# Patient Record
Sex: Female | Born: 1945
Health system: Southern US, Community
[De-identification: ages and names within clinical notes are randomized; demographics above are authoritative.]

## PROBLEM LIST (undated history)

## (undated) DIAGNOSIS — R112 Nausea with vomiting, unspecified: Secondary | ICD-10-CM

## (undated) DIAGNOSIS — I4891 Unspecified atrial fibrillation: Secondary | ICD-10-CM

## (undated) DIAGNOSIS — Z9889 Other specified postprocedural states: Secondary | ICD-10-CM

## (undated) DIAGNOSIS — R0600 Dyspnea, unspecified: Secondary | ICD-10-CM

## (undated) DIAGNOSIS — R300 Dysuria: Secondary | ICD-10-CM

## (undated) DIAGNOSIS — N189 Chronic kidney disease, unspecified: Secondary | ICD-10-CM

## (undated) DIAGNOSIS — T8859XA Other complications of anesthesia, initial encounter: Secondary | ICD-10-CM

## (undated) DIAGNOSIS — I639 Cerebral infarction, unspecified: Secondary | ICD-10-CM

## (undated) DIAGNOSIS — Z87442 Personal history of urinary calculi: Secondary | ICD-10-CM

## (undated) DIAGNOSIS — I10 Essential (primary) hypertension: Secondary | ICD-10-CM

## (undated) DIAGNOSIS — T4145XA Adverse effect of unspecified anesthetic, initial encounter: Secondary | ICD-10-CM

## (undated) DIAGNOSIS — G4730 Sleep apnea, unspecified: Secondary | ICD-10-CM

## (undated) DIAGNOSIS — M8580 Other specified disorders of bone density and structure, unspecified site: Secondary | ICD-10-CM

## (undated) DIAGNOSIS — N393 Stress incontinence (female) (male): Secondary | ICD-10-CM

## (undated) DIAGNOSIS — M1990 Unspecified osteoarthritis, unspecified site: Secondary | ICD-10-CM

## (undated) DIAGNOSIS — E119 Type 2 diabetes mellitus without complications: Secondary | ICD-10-CM

## (undated) HISTORY — PX: OTHER SURGICAL HISTORY: SHX169

## (undated) HISTORY — PX: KNEE ARTHROSCOPY: SUR90

## (undated) HISTORY — PX: LITHOTRIPSY: SUR834

## (undated) HISTORY — PX: JOINT REPLACEMENT: SHX530

## (undated) HISTORY — PX: TRIGGER FINGER RELEASE: SHX641

## (undated) HISTORY — PX: EYE SURGERY: SHX253

## (undated) HISTORY — PX: KIDNEY STONE SURGERY: SHX686

## (undated) HISTORY — PX: COLONOSCOPY W/ POLYPECTOMY: SHX1380

---

## 2004-03-23 ENCOUNTER — Inpatient Hospital Stay (HOSPITAL_COMMUNITY): Admission: RE | Admit: 2004-03-23 | Discharge: 2004-03-26 | Payer: Self-pay | Admitting: Orthopedic Surgery

## 2004-04-21 ENCOUNTER — Encounter: Payer: Self-pay | Admitting: Orthopedic Surgery

## 2004-05-10 ENCOUNTER — Encounter: Payer: Self-pay | Admitting: Orthopedic Surgery

## 2004-06-06 ENCOUNTER — Inpatient Hospital Stay: Payer: Self-pay | Admitting: Urology

## 2004-06-10 ENCOUNTER — Encounter: Payer: Self-pay | Admitting: Orthopedic Surgery

## 2004-06-12 ENCOUNTER — Other Ambulatory Visit: Payer: Self-pay

## 2004-07-08 ENCOUNTER — Encounter: Payer: Self-pay | Admitting: Orthopedic Surgery

## 2004-07-10 ENCOUNTER — Encounter: Payer: Self-pay | Admitting: Internal Medicine

## 2006-10-11 ENCOUNTER — Ambulatory Visit: Payer: Self-pay | Admitting: Internal Medicine

## 2007-11-06 ENCOUNTER — Emergency Department: Payer: Self-pay | Admitting: Emergency Medicine

## 2007-12-21 ENCOUNTER — Ambulatory Visit: Payer: Self-pay | Admitting: Internal Medicine

## 2009-02-13 ENCOUNTER — Ambulatory Visit: Payer: Self-pay | Admitting: Internal Medicine

## 2009-02-17 ENCOUNTER — Ambulatory Visit: Payer: Self-pay | Admitting: Pain Medicine

## 2009-03-03 ENCOUNTER — Ambulatory Visit: Payer: Self-pay | Admitting: Pain Medicine

## 2009-04-01 ENCOUNTER — Ambulatory Visit: Payer: Self-pay | Admitting: Pain Medicine

## 2009-04-07 ENCOUNTER — Ambulatory Visit: Payer: Self-pay | Admitting: Pain Medicine

## 2009-05-08 ENCOUNTER — Ambulatory Visit: Payer: Self-pay | Admitting: Pain Medicine

## 2009-05-19 ENCOUNTER — Ambulatory Visit: Payer: Self-pay | Admitting: Pain Medicine

## 2009-06-19 ENCOUNTER — Ambulatory Visit: Payer: Self-pay | Admitting: Pain Medicine

## 2009-06-30 ENCOUNTER — Ambulatory Visit: Payer: Self-pay | Admitting: Pain Medicine

## 2009-07-14 ENCOUNTER — Ambulatory Visit: Payer: Self-pay | Admitting: Pain Medicine

## 2010-02-25 ENCOUNTER — Ambulatory Visit: Payer: Self-pay | Admitting: Internal Medicine

## 2011-04-08 ENCOUNTER — Ambulatory Visit: Payer: Self-pay | Admitting: Internal Medicine

## 2012-04-12 ENCOUNTER — Ambulatory Visit: Payer: Self-pay | Admitting: Internal Medicine

## 2013-02-08 ENCOUNTER — Ambulatory Visit: Payer: Self-pay | Admitting: Ophthalmology

## 2013-02-08 LAB — POTASSIUM: Potassium: 3.3 mmol/L — ABNORMAL LOW (ref 3.5–5.1)

## 2013-02-21 ENCOUNTER — Ambulatory Visit: Payer: Self-pay | Admitting: Ophthalmology

## 2013-03-06 ENCOUNTER — Ambulatory Visit: Payer: Self-pay | Admitting: Ophthalmology

## 2013-04-18 ENCOUNTER — Ambulatory Visit: Payer: Self-pay | Admitting: Internal Medicine

## 2013-05-21 ENCOUNTER — Encounter: Payer: Self-pay | Admitting: Internal Medicine

## 2013-06-10 ENCOUNTER — Encounter: Payer: Self-pay | Admitting: Internal Medicine

## 2013-07-08 ENCOUNTER — Encounter: Payer: Self-pay | Admitting: Internal Medicine

## 2013-07-28 ENCOUNTER — Ambulatory Visit: Payer: Self-pay | Admitting: Urology

## 2013-07-28 ENCOUNTER — Inpatient Hospital Stay: Payer: Self-pay | Admitting: Internal Medicine

## 2013-07-28 LAB — COMPREHENSIVE METABOLIC PANEL
Albumin: 2.9 g/dL — ABNORMAL LOW (ref 3.4–5.0)
Alkaline Phosphatase: 60 U/L
Anion Gap: 9 (ref 7–16)
BUN: 32 mg/dL — ABNORMAL HIGH (ref 7–18)
Bilirubin,Total: 1.3 mg/dL — ABNORMAL HIGH (ref 0.2–1.0)
Calcium, Total: 9.1 mg/dL (ref 8.5–10.1)
Chloride: 97 mmol/L — ABNORMAL LOW (ref 98–107)
Co2: 28 mmol/L (ref 21–32)
Creatinine: 2.33 mg/dL — ABNORMAL HIGH (ref 0.60–1.30)
EGFR (African American): 24 — ABNORMAL LOW
EGFR (Non-African Amer.): 21 — ABNORMAL LOW
Glucose: 197 mg/dL — ABNORMAL HIGH (ref 65–99)
Osmolality: 281 (ref 275–301)
Potassium: 3.5 mmol/L (ref 3.5–5.1)
SGOT(AST): 24 U/L (ref 15–37)
SGPT (ALT): 24 U/L (ref 12–78)
Sodium: 134 mmol/L — ABNORMAL LOW (ref 136–145)
Total Protein: 6.2 g/dL — ABNORMAL LOW (ref 6.4–8.2)

## 2013-07-28 LAB — URINALYSIS, COMPLETE
Glucose,UR: NEGATIVE mg/dL (ref 0–75)
Hyaline Cast: 3
Ketone: NEGATIVE
Nitrite: NEGATIVE
Ph: 5 (ref 4.5–8.0)
Protein: 100
RBC,UR: 25 /HPF (ref 0–5)
Specific Gravity: 1.016 (ref 1.003–1.030)
Squamous Epithelial: 19
WBC UR: 686 /HPF (ref 0–5)

## 2013-07-28 LAB — BASIC METABOLIC PANEL
Anion Gap: 9 (ref 7–16)
BUN: 31 mg/dL — ABNORMAL HIGH (ref 7–18)
Calcium, Total: 8.6 mg/dL (ref 8.5–10.1)
Chloride: 102 mmol/L (ref 98–107)
Co2: 24 mmol/L (ref 21–32)
Creatinine: 2.29 mg/dL — ABNORMAL HIGH (ref 0.60–1.30)
EGFR (African American): 25 — ABNORMAL LOW
EGFR (Non-African Amer.): 21 — ABNORMAL LOW
Glucose: 117 mg/dL — ABNORMAL HIGH (ref 65–99)
Osmolality: 278 (ref 275–301)
Potassium: 3.4 mmol/L — ABNORMAL LOW (ref 3.5–5.1)
Sodium: 135 mmol/L — ABNORMAL LOW (ref 136–145)

## 2013-07-28 LAB — CBC WITH DIFFERENTIAL/PLATELET
Basophil #: 0 10*3/uL (ref 0.0–0.1)
Basophil %: 0.2 %
Eosinophil #: 0 10*3/uL (ref 0.0–0.7)
Eosinophil %: 0 %
HCT: 42.8 % (ref 35.0–47.0)
HGB: 14.8 g/dL (ref 12.0–16.0)
Lymphocyte #: 0.6 10*3/uL — ABNORMAL LOW (ref 1.0–3.6)
Lymphocyte %: 2.7 %
MCH: 31.1 pg (ref 26.0–34.0)
MCHC: 34.6 g/dL (ref 32.0–36.0)
MCV: 90 fL (ref 80–100)
Monocyte #: 0.6 x10 3/mm (ref 0.2–0.9)
Monocyte %: 2.8 %
Neutrophil #: 20.1 10*3/uL — ABNORMAL HIGH (ref 1.4–6.5)
Neutrophil %: 94.3 %
Platelet: 232 10*3/uL (ref 150–440)
RBC: 4.76 10*6/uL (ref 3.80–5.20)
RDW: 13.4 % (ref 11.5–14.5)
WBC: 21.4 10*3/uL — ABNORMAL HIGH (ref 3.6–11.0)

## 2013-07-29 LAB — CBC WITH DIFFERENTIAL/PLATELET
Basophil #: 0.1 10*3/uL (ref 0.0–0.1)
Basophil %: 0.4 %
Eosinophil #: 0.1 10*3/uL (ref 0.0–0.7)
Eosinophil %: 0.5 %
HCT: 37.3 % (ref 35.0–47.0)
HGB: 12.7 g/dL (ref 12.0–16.0)
Lymphocyte #: 0.5 10*3/uL — ABNORMAL LOW (ref 1.0–3.6)
Lymphocyte %: 3.1 %
MCH: 30.7 pg (ref 26.0–34.0)
MCHC: 34.1 g/dL (ref 32.0–36.0)
MCV: 90 fL (ref 80–100)
Monocyte #: 0.4 x10 3/mm (ref 0.2–0.9)
Monocyte %: 2.7 %
Neutrophil #: 14.3 10*3/uL — ABNORMAL HIGH (ref 1.4–6.5)
Neutrophil %: 93.3 %
Platelet: 184 10*3/uL (ref 150–440)
RBC: 4.15 10*6/uL (ref 3.80–5.20)
RDW: 13.7 % (ref 11.5–14.5)
WBC: 15.4 10*3/uL — ABNORMAL HIGH (ref 3.6–11.0)

## 2013-07-29 LAB — BASIC METABOLIC PANEL
Anion Gap: 8 (ref 7–16)
BUN: 38 mg/dL — ABNORMAL HIGH (ref 7–18)
Calcium, Total: 8.2 mg/dL — ABNORMAL LOW (ref 8.5–10.1)
Chloride: 102 mmol/L (ref 98–107)
Co2: 25 mmol/L (ref 21–32)
Creatinine: 2.84 mg/dL — ABNORMAL HIGH (ref 0.60–1.30)
EGFR (African American): 19 — ABNORMAL LOW
EGFR (Non-African Amer.): 16 — ABNORMAL LOW
Glucose: 109 mg/dL — ABNORMAL HIGH (ref 65–99)
Osmolality: 280 (ref 275–301)
Potassium: 3.7 mmol/L (ref 3.5–5.1)
Sodium: 135 mmol/L — ABNORMAL LOW (ref 136–145)

## 2013-07-30 LAB — CREATININE, SERUM
Creatinine: 2.4 mg/dL — ABNORMAL HIGH (ref 0.60–1.30)
EGFR (African American): 23 — ABNORMAL LOW
EGFR (Non-African Amer.): 20 — ABNORMAL LOW

## 2013-07-31 LAB — CBC WITH DIFFERENTIAL/PLATELET
Basophil #: 0.1 10*3/uL (ref 0.0–0.1)
Basophil %: 0.6 %
Eosinophil #: 0.4 10*3/uL (ref 0.0–0.7)
Eosinophil %: 4.1 %
HCT: 34.2 % — ABNORMAL LOW (ref 35.0–47.0)
HGB: 11.8 g/dL — ABNORMAL LOW (ref 12.0–16.0)
Lymphocyte #: 0.8 10*3/uL — ABNORMAL LOW (ref 1.0–3.6)
Lymphocyte %: 9.6 %
MCH: 30.8 pg (ref 26.0–34.0)
MCHC: 34.4 g/dL (ref 32.0–36.0)
MCV: 90 fL (ref 80–100)
Monocyte #: 0.6 x10 3/mm (ref 0.2–0.9)
Monocyte %: 7.1 %
Neutrophil #: 6.8 10*3/uL — ABNORMAL HIGH (ref 1.4–6.5)
Neutrophil %: 78.6 %
Platelet: 184 10*3/uL (ref 150–440)
RBC: 3.83 10*6/uL (ref 3.80–5.20)
RDW: 13.9 % (ref 11.5–14.5)
WBC: 8.7 10*3/uL (ref 3.6–11.0)

## 2013-07-31 LAB — BASIC METABOLIC PANEL
Anion Gap: 6 — ABNORMAL LOW (ref 7–16)
BUN: 35 mg/dL — ABNORMAL HIGH (ref 7–18)
Calcium, Total: 7.8 mg/dL — ABNORMAL LOW (ref 8.5–10.1)
Chloride: 112 mmol/L — ABNORMAL HIGH (ref 98–107)
Co2: 24 mmol/L (ref 21–32)
Creatinine: 2.08 mg/dL — ABNORMAL HIGH (ref 0.60–1.30)
EGFR (African American): 27 — ABNORMAL LOW
EGFR (Non-African Amer.): 24 — ABNORMAL LOW
Glucose: 82 mg/dL (ref 65–99)
Osmolality: 290 (ref 275–301)
Potassium: 3.5 mmol/L (ref 3.5–5.1)
Sodium: 142 mmol/L (ref 136–145)

## 2013-08-01 LAB — CBC WITH DIFFERENTIAL/PLATELET
Basophil #: 0 10*3/uL (ref 0.0–0.1)
Basophil %: 0.7 %
Eosinophil #: 0.3 10*3/uL (ref 0.0–0.7)
Eosinophil %: 5 %
HCT: 35.2 % (ref 35.0–47.0)
HGB: 11.9 g/dL — ABNORMAL LOW (ref 12.0–16.0)
Lymphocyte #: 1 10*3/uL (ref 1.0–3.6)
Lymphocyte %: 15.4 %
MCH: 30.3 pg (ref 26.0–34.0)
MCHC: 33.7 g/dL (ref 32.0–36.0)
MCV: 90 fL (ref 80–100)
Monocyte #: 0.7 x10 3/mm (ref 0.2–0.9)
Monocyte %: 9.9 %
Neutrophil #: 4.6 10*3/uL (ref 1.4–6.5)
Neutrophil %: 69 %
Platelet: 208 10*3/uL (ref 150–440)
RBC: 3.91 10*6/uL (ref 3.80–5.20)
RDW: 14.3 % (ref 11.5–14.5)
WBC: 6.7 10*3/uL (ref 3.6–11.0)

## 2013-08-01 LAB — BASIC METABOLIC PANEL
Anion Gap: 6 — ABNORMAL LOW (ref 7–16)
BUN: 27 mg/dL — ABNORMAL HIGH (ref 7–18)
Calcium, Total: 7.8 mg/dL — ABNORMAL LOW (ref 8.5–10.1)
Chloride: 112 mmol/L — ABNORMAL HIGH (ref 98–107)
Co2: 26 mmol/L (ref 21–32)
Creatinine: 1.86 mg/dL — ABNORMAL HIGH (ref 0.60–1.30)
EGFR (African American): 31 — ABNORMAL LOW
EGFR (Non-African Amer.): 27 — ABNORMAL LOW
Glucose: 93 mg/dL (ref 65–99)
Osmolality: 292 (ref 275–301)
Potassium: 3.1 mmol/L — ABNORMAL LOW (ref 3.5–5.1)
Sodium: 144 mmol/L (ref 136–145)

## 2013-08-01 LAB — URINE CULTURE

## 2013-08-09 DIAGNOSIS — N200 Calculus of kidney: Secondary | ICD-10-CM | POA: Insufficient documentation

## 2013-08-09 DIAGNOSIS — N23 Unspecified renal colic: Secondary | ICD-10-CM | POA: Insufficient documentation

## 2013-08-09 DIAGNOSIS — N201 Calculus of ureter: Secondary | ICD-10-CM | POA: Insufficient documentation

## 2013-08-09 DIAGNOSIS — N1330 Unspecified hydronephrosis: Secondary | ICD-10-CM | POA: Insufficient documentation

## 2013-08-13 ENCOUNTER — Ambulatory Visit: Payer: Self-pay | Admitting: Urology

## 2013-08-13 LAB — CBC WITH DIFFERENTIAL/PLATELET
Basophil #: 0.1 10*3/uL (ref 0.0–0.1)
Basophil %: 1.4 %
Eosinophil #: 0.2 10*3/uL (ref 0.0–0.7)
Eosinophil %: 2.7 %
HCT: 39.6 % (ref 35.0–47.0)
HGB: 12.9 g/dL (ref 12.0–16.0)
Lymphocyte #: 1.8 10*3/uL (ref 1.0–3.6)
Lymphocyte %: 25.5 %
MCH: 29 pg (ref 26.0–34.0)
MCHC: 32.6 g/dL (ref 32.0–36.0)
MCV: 89 fL (ref 80–100)
Monocyte #: 0.7 x10 3/mm (ref 0.2–0.9)
Monocyte %: 9.3 %
Neutrophil #: 4.4 10*3/uL (ref 1.4–6.5)
Neutrophil %: 61.1 %
Platelet: 390 10*3/uL (ref 150–440)
RBC: 4.45 10*6/uL (ref 3.80–5.20)
RDW: 14.2 % (ref 11.5–14.5)
WBC: 7.2 10*3/uL (ref 3.6–11.0)

## 2013-08-13 LAB — BASIC METABOLIC PANEL
Anion Gap: 5 — ABNORMAL LOW (ref 7–16)
BUN: 14 mg/dL (ref 7–18)
Calcium, Total: 9.1 mg/dL (ref 8.5–10.1)
Chloride: 104 mmol/L (ref 98–107)
Co2: 30 mmol/L (ref 21–32)
Creatinine: 1.62 mg/dL — ABNORMAL HIGH (ref 0.60–1.30)
EGFR (African American): 37 — ABNORMAL LOW
EGFR (Non-African Amer.): 32 — ABNORMAL LOW
Glucose: 147 mg/dL — ABNORMAL HIGH (ref 65–99)
Osmolality: 281 (ref 275–301)
Potassium: 4.1 mmol/L (ref 3.5–5.1)
Sodium: 139 mmol/L (ref 136–145)

## 2013-08-14 ENCOUNTER — Ambulatory Visit: Payer: Self-pay | Admitting: Urology

## 2013-08-31 DIAGNOSIS — N200 Calculus of kidney: Secondary | ICD-10-CM | POA: Insufficient documentation

## 2013-08-31 DIAGNOSIS — T190XXA Foreign body in urethra, initial encounter: Secondary | ICD-10-CM | POA: Insufficient documentation

## 2013-08-31 DIAGNOSIS — T191XXA Foreign body in bladder, initial encounter: Secondary | ICD-10-CM

## 2014-02-25 DIAGNOSIS — E118 Type 2 diabetes mellitus with unspecified complications: Secondary | ICD-10-CM | POA: Insufficient documentation

## 2014-05-15 DIAGNOSIS — E785 Hyperlipidemia, unspecified: Secondary | ICD-10-CM | POA: Diagnosis not present

## 2014-05-15 DIAGNOSIS — I10 Essential (primary) hypertension: Secondary | ICD-10-CM | POA: Diagnosis not present

## 2014-05-15 DIAGNOSIS — E119 Type 2 diabetes mellitus without complications: Secondary | ICD-10-CM | POA: Diagnosis not present

## 2014-05-22 DIAGNOSIS — M109 Gout, unspecified: Secondary | ICD-10-CM | POA: Insufficient documentation

## 2014-05-22 DIAGNOSIS — M1000 Idiopathic gout, unspecified site: Secondary | ICD-10-CM | POA: Insufficient documentation

## 2014-06-13 ENCOUNTER — Ambulatory Visit: Payer: Self-pay | Admitting: Internal Medicine

## 2014-06-13 DIAGNOSIS — Z1231 Encounter for screening mammogram for malignant neoplasm of breast: Secondary | ICD-10-CM | POA: Diagnosis not present

## 2014-08-30 NOTE — Op Note (Signed)
PATIENT NAME:  [name], DROEGE MR#:  [id number] DATE OF BIRTH:  1946/01/14  DATE OF PROCEDURE:  03/06/2013  PREOPERATIVE DIAGNOSIS: Visually significant cataract of the left eye.   POSTOPERATIVE DIAGNOSIS: Visually significant cataract of the left eye.   OPERATIVE PROCEDURE: Cataract extraction by phacoemulsification with implant of intraocular lens to left eye.   SURGEON: Birder Robson, MD.   ANESTHESIA:  1. Managed anesthesia care.  2. Topical tetracaine drops followed by 2% Xylocaine jelly applied in the preoperative holding area.   COMPLICATIONS: None.   TECHNIQUE: Stop and chop.   DESCRIPTION OF PROCEDURE: The patient was examined and consented in the preoperative holding area where the aforementioned topical anesthesia was applied to the left eye and then brought back to the Operating Room where the left eye was prepped and draped in the usual sterile ophthalmic fashion and a lid speculum was placed. A paracentesis was created with the side port blade and the anterior chamber was filled with viscoelastic. A near clear corneal incision was performed with the steel keratome. A continuous curvilinear capsulorrhexis was performed with a cystotome followed by the capsulorrhexis forceps. Hydrodissection and hydrodelineation were carried out with BSS on a blunt cannula. The lens was removed in a stop and chop technique and the remaining cortical material was removed with the irrigation-aspiration handpiece. The capsular bag was inflated with viscoelastic and the Tecnis ZCB00 21.0-diopter lens, serial number [id number], was placed in the capsular bag without complication. The remaining viscoelastic was removed from the eye with the irrigation-aspiration handpiece. The wounds were hydrated. The anterior chamber was flushed with Miostat and the eye was inflated to physiologic pressure. 0.1 mL of cefuroxime concentration 10 mg/mL was placed in the anterior chamber. The wounds were found to be  water tight. The eye was dressed with Vigamox. The patient was given protective glasses to wear throughout the day and a shield with which to sleep tonight. The patient was also given drops with which to begin a drop regimen today and will follow-up with me in one day.    ____________________________ Livingston Diones. Norvell Caswell, MD wlp:jm D: 03/06/2013 18:41:34 ET T: 03/06/2013 19:59:38 ET JOB#: [id number]  cc: Tomasa Dobransky L. Alexza Norbeck, MD, <Dictator> Livingston Diones Missy Baksh MD ELECTRONICALLY SIGNED 03/12/2013 10:30

## 2014-08-30 NOTE — Op Note (Signed)
PATIENT NAME:  [name], [name] MR#:  [id number] DATE OF BIRTH:  03/08/1946  DATE OF PROCEDURE:  02/21/2013  PREOPERATIVE DIAGNOSIS: Visually significant cataract of the right eye.   POSTOPERATIVE DIAGNOSIS: Visually significant cataract of the right eye.   OPERATIVE PROCEDURE: Cataract extraction by phacoemulsification with implant of intraocular lens to right eye.   SURGEON: Birder Robson, MD   ANESTHESIA:  1. Managed anesthesia care.  2. Topical tetracaine drops followed by 2% Xylocaine jelly applied in the preoperative holding area.   COMPLICATIONS: None.   TECHNIQUE:  Stop and chop.    DESCRIPTION OF PROCEDURE: The patient was examined and consented in the preoperative holding area where the aforementioned topical anesthesia was applied to the right eye.  The patient was brought back to the Operating Room where he was sat upright on the gurney and given a target to fixate upon while the eye was marked at the 3:00 and 9:00 position.  The patient was then reclined on the operating table, and lidocaine jelly was applied to the right eye.  The eye was prepped and draped in the usual sterile ophthalmic fashion and a lid speculum was placed. A paracentesis was created with the side port blade and the anterior chamber was filled with viscoelastic. A near clear corneal incision was performed with the steel keratome. A continuous curvilinear capsulorrhexis was performed with a cystotome followed by the capsulorrhexis forceps. Hydrodissection and hydrodelineation were carried out with BSS on a blunt cannula. The lens was removed in a stop and chop technique and the remaining cortical material was removed with the irrigation-aspiration handpiece. The eye was inflated with viscoelastic and the Tecnis Toric ZCT150 22.0-diopter lens, serial number [id number] was placed in the eye and rotated to within a few degrees of the predetermined orientation at 143 degrees.  The remaining viscoelastic was removed  from the eye.  The Sinskey hook was used to rotate the toric lens into its final resting place at 143 degrees.  0.1 mL of cefuroxime concentration 10 mg/mL was placed in the anterior chamber. The eye was inflated to a physiologic pressure and found to be watertight.  The eye was dressed with Vigamox. The patient was given protective glasses to wear throughout the day and a shield with which to sleep tonight. The patient was also given drops with which to begin a drop regimen today and will follow-up with me in one day.     ____________________________ Livingston Diones. Aftin Lye, MD wlp:cc D: 02/21/2013 17:13:00 ET T: 02/21/2013 20:24:27 ET JOB#: [id number]  cc: Sariya Trickey L. Furqan Gosselin, MD, <Dictator> Livingston Diones Chantavia Bazzle MD ELECTRONICALLY SIGNED 02/26/2013 13:20

## 2014-08-31 NOTE — Op Note (Signed)
PATIENT NAME:  [name], [name] MR#:  [id number] DATE OF BIRTH:  [birth year]  DATE OF PROCEDURE:  08/14/2013  PRINCIPAL DIAGNOSIS: Left nephrolithiasis.   POSTOPERATIVE DIAGNOSIS: Left nephrolithiasis.    PROCEDURE: Left ureteroscopy with holmium laser lithotripsy, left double-J ureteral stent placement.   SURGEON: Denice Bors. Jacqlyn Larsen, MD  ANESTHESIA: General endotracheal anesthesia.   INDICATIONS: The patient is a 69 year old white female with a history of nephrolithiasis. She presented recently to the Emergency Room with sudden-onset left-sided flank pain. She was found to have an approximate 1 cm obstructing proximal left ureteral calculus just below the UPJ region. She was noted to have some air in the collecting system at the time of diagnosis. Extensive perinephric stranding and inflammatory change were present, consistent with infection and sepsis. She underwent emergent cystoscopy and stent placement. She has recovered from the acute illness related to the obstruction. She was also noted on CT scan to have multiple stones in a lower lateral portion of the left kidney. She presents for ureteroscopy and stone removal.   DESCRIPTION OF PROCEDURE: After informed consent was obtained, the patient was taken to the operating room and placed in the dorsal lithotomy position under general endotracheal anesthesia. The patient was then prepped and draped in the usual standard fashion. The 47 French rigid cystoscope was introduced into the urethra under direct vision, with no urethral abnormalities noted. Upon entering the bladder, the mucosa was inspected in its entirety with no gross mucosal lesions noted. Bilateral ureteral orifices were well visualized. No abnormalities were noted around the right ureteral orifice. A stent was noted protruding from the left ureteral orifice, with mild erythema surrounding the opening. A guidewire was advanced through the scope into the left upper pole collecting system  under fluoroscopic guidance without difficulty. Grasping forceps were then utilized to remove the previously placed stent. The cystoscope was removed. The 6 French rigid ureteroscope was advanced into the bladder. It was easily advanced into the dilated left ureter. It was advanced to the level of the ureteropelvic junction. The previously noted ureteral stone was no longer appreciated. It was felt to have gone back into the kidney during stent placement. Stones could not be visualized with the rigid scope. The rigid scope was removed. The flexible ureteroscope was then advanced over the previously placed guidewire. It was easily advanced through the ureter to the level of the renal pelvis both under visual and fluoroscopic guidance. No sheath was necessary for stent placement. The guidewire was then removed. The upper calyces were inspected, with no evidence of stone. A more lateral calyx was identified with several small stones within. In a lower lateral calyx, a large collection of stones was identified. These included stones at least 1 to 1.2 cm in size in addition to multiple smaller stones. The holmium laser fiber was then utilized to initiate fragmentation. The stones were consistent with uric acid stones based on fragmentation. Significant dusting of the stone was accomplished. A more inferolateral calyx was entered. The multiple small stones within this calyx were also fragmented utilizing the holmium laser fiber. As the fragmentation proceeded, there was an issue with the holmium laser. This was unable to be corrected. The EHL device was then utilized to complete the fragmentation. The 1.9 French EHL probe was utilized. The remaining stone fragments were broken further into multiple small pieces utilizing the EHL. This included the lateral calyx and the more central portion of the mid kidney. As the fragments were lessened in size, the smaller fragments  in the lateral calyx were grasped utilizing a basket  and brought into the renal pelvis and proximal ureter to help facilitate passage. The remaining lower calyces were inspected, with no additional stones noted. No significant bleeding was encountered. The ureteroscope was removed. The 7 French rigid cystoscope was replaced back into the urinary bladder, back-loaded over the guidewire. A new 6 French x 24 cm double-J ureteral stent was advanced over the guidewire. Initial placement resulted in improper curl within the kidney. The guidewire and stent were removed. The guidewire was replaced, with stent replacement over the guidewire. More appropriate curl was achieved on the proximal end. Adequate curl was also noted within the urinary bladder. The bladder was drained. A few small stone fragments were collected. The bulk of the stone, however, was small and could not be collected. These will be sent for stone analysis. The bladder was drained. The cystoscope was removed. The patient was returned to the supine position and awakened from general endotracheal anesthesia. She was taken to the recovery room in stable condition. There were no problems or complications. The patient tolerated the procedure well.    ____________________________ Denice Bors. Jacqlyn Larsen, MD bsc:lb D: 08/14/2013 09:12:04 ET T: 08/14/2013 09:33:01 ET JOB#: [id number]  cc: Denice Bors. Jacqlyn Larsen, MD, <Dictator> Denice Bors Merida Alcantar MD ELECTRONICALLY SIGNED 08/15/2013 23:45

## 2014-08-31 NOTE — Consult Note (Signed)
Admit Diagnosis:   CALCULUS OF KIDNEY: Onset Date: 28-Jul-2013, Status: Active, Description: CALCULUS OF KIDNEY    stroke 7 years ago:    Sleep Apnea:    Kidney Stones:    Diabetes - Borderline:    htn:    thump surgery:    Thyroidectomy:    kidney stone removal:    total  left knee re[placement:   Home Medications: Medication Instructions Status  metoprolol 100 mg oral tablet 1 tab(s) orally 2 times a day Active  hydrochlorothiazide-lisinopril 25 mg-20 mg oral tablet 1 tab(s) orally once a day (in the morning) Active  simvastatin 20 mg oral tablet 1 tab(s) orally once a day (in the evening) Active  metFORMIN 1000 mg oral tablet 1 tab(s) orally 2 times a day Active  liraglutide 18 mg/3 mL subcutaneous solution 1.8 milligram(s) subcutaneous once a day (in the evening). *prefilled pen* Active  etodolac 400 mg oral tablet 1 tab(s) orally 2 times a day Active  Calcium 600+D 1 tab(s) orally once a day Active  Vitamin B12 500 mcg oral tablet 1 tab(s) orally once a day (in the morning) Active  Aspirin Enteric Coated 81 mg oral delayed release tablet 1 tab(s) orally once a day (in the evening) Active  Aleve sodium 220 mg oral tablet 1 tab(s) orally once a day (in the morning) Active   Lab Results:  Hepatic:  21-Mar-15 12:13   Bilirubin, Total  1.3  Alkaline Phosphatase 60 (45-117 NOTE: New Reference Range 03/30/13)  SGPT (ALT) 24  SGOT (AST) 24  Total Protein, Serum  6.2  Albumin, Serum  2.9  Routine Chem:  21-Mar-15 12:13   Result Comment PLATELET - SLIGHT PLATELET CLUMPING IN SPECIMEN. ACTUAL  - NUMERICAL COUNT MAY BE SOMEWHAT HIGHER THAN  - THE REPORTED VALUE.  Result(s) reported on 28 Jul 2013 at 03:52PM.  Glucose, Serum  197  BUN  32  Creatinine (comp)  2.33  Sodium, Serum  134  Potassium, Serum 3.5  Chloride, Serum  97  CO2, Serum 28  Calcium (Total), Serum 9.1  Osmolality (calc) 281  eGFR (African American)  24  eGFR (Non-African American)  21 (eGFR  values <72m/min/1.73 m2 may be an indication of chronic kidney disease (CKD). Calculated eGFR is useful in patients with stable renal function. The eGFR calculation will not be reliable in acutely ill patients when serum creatinine is changing rapidly. It is not useful in  patients on dialysis. The eGFR calculation may not be applicable to patients at the low and high extremes of body sizes, pregnant women, and vegetarians.)  Anion Gap 9  Routine UA:  21-Mar-15 12:13   Color (UA) Yellow  Clarity (UA) Cloudy  Glucose (UA) Negative  Bilirubin (UA) 2+  Ketones (UA) Negative  Specific Gravity (UA) 1.016  Blood (UA) 2+  pH (UA) 5.0  Protein (UA) 100 mg/dL  Nitrite (UA) Negative  Leukocyte Esterase (UA) 3+ (Result(s) reported on 28 Jul 2013 at 02:38PM.)  RBC (UA) 25 /HPF  WBC (UA) 686 /HPF  Bacteria (UA) TRACE  Epithelial Cells (UA) 19 /HPF  WBC Clump (UA) PRESENT  Mucous (UA) PRESENT  Hyaline Cast (UA) 3 /LPF (Result(s) reported on 28 Jul 2013 at 02:38PM.)  Routine Hem:  21-Mar-15 12:13   WBC (CBC)  21.4  RBC (CBC) 4.76  Hemoglobin (CBC) 14.8  Hematocrit (CBC) 42.8  Platelet Count (CBC) 232  MCV 90  MCH 31.1  MCHC 34.6  RDW 13.4  Neutrophil % 94.3  Lymphocyte % 2.7  Monocyte % 2.8  Eosinophil % 0.0  Basophil % 0.2  Neutrophil #  20.1  Lymphocyte #  0.6  Monocyte # 0.6  Eosinophil # 0.0  Basophil # 0.0   Radiology Results:  Radiology Results: CT:    21-Mar-15 14:08, CT Abdomen Pelvis WO for Stone  CT Abdomen Pelvis WO for Stone  REASON FOR EXAM:    L flank pain, h/o stones, elevated Creat,   leukocytosis, hematuria, dysuria  COMMENTS:       PROCEDURE: CT  - CT ABDOMEN /PELVIS WO (STONE)  - Jul 28 2013  2:08PM     CLINICAL DATA:  Left flank pain, dysuria, fever    EXAM:  CT ABDOMEN AND PELVIS WITHOUT CONTRAST    TECHNIQUE:  Multidetector CT imaging of the abdomen and pelvis was performed  following the standard protocol without IV contrast.  COMPARISON:   None.    FINDINGS:  Lung bases are clear.    Unenhanced liver, spleen, pancreas, and adrenal glands are within  normal limits.    Contracted gallbladder with layering gallstones. No associated  inflammatory changes.    Right renal cortical atrophy. Two nonobstructing right renal calculi  measuring to 3 mm (series 2/images 32 and 36). No hydronephrosis.    Left kidney is notable for nonspecific perinephric stranding. Two  dominant left lower pole renal calculi measuring 9-10 mm (coronal  image 39). Additional calculus in the left renal pelvis (series  2/image 39). Obstructing 12 mm proximal left ureteral calculus  (coronal image 73) with mild left hydronephrosis. Scattered foci of  gas within the left renal collecting system and proximal ureter  (coronal images 73, 75, and 86), suggesting emphysematous pyelitis.    No evidence of bowel obstruction. Normal appendix. Colonic  diverticulosis, without associated inflammatory changes.    Atherosclerotic calcifications of the abdominal aorta and branch  vessels.    No abdominopelvic ascites.  No suspicious abdominopelvic lymphadenopathy.    Suspected calcified right uterine fibroids (series 2/image 71).  Bilateral ovaries are unremarkable.    No distal ureteral or bladder calculi. Bladder is underdistended but  otherwise within normal limits.    Degenerative changes of the visualized thoracolumbar spine.     IMPRESSION:  12 mm obstructing left ureteral calculus with mild hydronephrosis.    Associated scattered foci of gas within the left renal collecting  system/proximal ureter, suggesting emphysematous pyelitis.  Decompression of the collecting system is suggested.    Additional nonobstructing bilateral calculi, as above.    These results were called by telephone at the time of interpretation  on 07/28/2013 at 2:39 PM to [facility], who verbally  acknowledged these results.      Electronically Signed    By:  Julian Hy M.D.    On: 07/28/2013 14:40         Verified By: Julian Hy, M.D.,    No Known Allergies:    Present Illness 69 year old with known stone history presents with 24 hours of left flank pan.  + nausea, no fever, chill, sweats.  Last stone was 5-10 years ago treated with stent and ESWL by Dr. Jacqlyn Larsen.  Has remote history of open stone surgery in 1977.  No routine stone f/u.  She has leukocytosis and obstructing Left ureteral stone by CT.  Pt. now admitted to hospitalist service.   Case History and Physical Exam:  Chief Complaint Abdominal Pain   Past Medical Health Hypertension, Diabetes Mellitus   Past Surgical History ESWL, open ston,  L TKA, thyroid   Primary Care Provider Sabra Heck   Family History Non-Contributory   HEENT PERLA   Neck/Nodes Supple  No Adenopathy   Chest/Lungs Clear   Breasts Not examined   Cardiovascular Normal Sinus Rhythm   Abdomen Benign  moderate L CVAT   Genitalia Not examined   Rectal Not examined   Musculoskeletal Full range of motion   Neurological Grossly WNL   Skin Warm    Impression 69 yo with 12 mm obstructive Left proximal ureteral stone with hydronephrosis, renal pelvic air, leukocytosis, and elevated Cr.   Pt clinically looks good and is afebrile.  She has received Rocephin in th ED.  The need for renal pelvic decompression fully discussed with patient.  Will arrange L JJ stent placment.  If cannot place, will need L PCN tube.  Pt. understands risks/benefits fully.  All questions answered.   Plan - NPO/IVF - IV pain control - to OR for left ureteral stent (informed consent obtained) - Abx per primary, will get Cipro on-call to OR - Definitive stone management per Dr. Jacqlyn Larsen once at clinical baseline   Electronic Signatures: Felicity Coyer (MD)  (Signed 21-Mar-15 17:49)  Authored: Health Issues, Significant Events - History, Home Medications, Labs, Radiology Results, Allergies, General Aspect/Present  Illness, History and Physical Exam, Impression/Plan   Last Updated: 21-Mar-15 17:49 by Felicity Coyer (MD)

## 2014-08-31 NOTE — Op Note (Signed)
PATIENT NAME:  [name], TROPEANO MR#:  [id number] DATE OF BIRTH:  November 22, 1945  DATE OF PROCEDURE:  07/28/2013  PREOPERATIVE DIAGNOSES:  1.  Left proximal ureteral stone with obstruction.  2.  Left hydronephrosis.  3.  Acute renal insufficiency.  4. Leukocytosis, concern for infection.   POSTOPERATIVE DIAGNOSES:  1.  Left proximal ureteral stone with obstruction.  2.  Left hydronephrosis.  3.  Acute renal insufficiency.  4. Leukocytosis, concern for infection.   PROCEDURE PERFORMED: Cystoscopy with left retrograde pyelogram and left ureteral stent insertion.   SURGEON: Leona Singleton, MD  ASSISTANT: None.   ANESTHESIA: General.   BLOOD LOSS: Minimal.   DRAINS: A 6 French by 24 cm left double-J stent.   INDICATION FOR OPERATION: The patient is a 69 year old female with a known renal stone history, who was seen today and found to have a 12 mm proximal obstructing ureteral stone. She is afebrile; however, she has a leukocytosis and concern for UTI. She was counseled regarding need for stent placement, and that no definitive stone management will be performed today. She understands and provided [place] informed consent.   DESCRIPTION OF PROCEDURE: The patient was identified in the preoperative holding area. She was given preoperative Cipro 400 mg IV. She was then brought back to the Operating Room, placed under sedation without difficulty. She was prepped and draped in sterile dorsal lithotomy position. All pressure points were padded appropriately. Timeout was then called, confirming operative site, operation to be performed, correct patient. All present were in  agreement. A 22 French cystoscope was used to enter and observe the bladder, on 360-degree evaluation was found to be without mass or lesion. The left ureteral orifice was identified. Scout fluoroscopic imaging showed no obvious radiopaque stone. The left ureteral orifice was then cannulated with a 0.035 Glidewire, over which a 5 [place]  ureteral stent was advanced into the region of the renal pelvis. There was some moderate amount of difficulty getting it past the proximal ureter in the area of the suspected stone. I immediately withdrew the wire, and then took a left renal pelvic urine sample, which was purulent. This was sent. Approximately 8 mL was sucked out of the renal pelvis and sent for specimen. A gentle retrograde pyelogram was then shot using about 3 mL of 50% Isovue contrast, showing no hydronephrosis at that time, status post the sample being removed from the renal pelvis. However, this allowed for placement of the stent. The wire was replaced through the 5 [place] open-ended ureteral catheter, which was then removed. Then using a combination of direct visualization and fluoroscopic guidance, a 6 French x 24 cm left double-J stent was placed. Excellent coil was noted proximally in the renal pelvis. Excellent coil was noted distally visually in the bladder. The 67 French Foley catheter was then placed and the bladder was drained. The patient was awakened from anesthesia and transitioned to PACU in stable condition.   CONDITION:  Stable to PACU.   DISPOSITION: Admitted to the floor. Foley catheter x 24 hours. IV antibiotics. Definitive stone management per primary urologist, Dr. __________, at a later date.    ____________________________ Juliann Mule. Nobie Putnam, MD erh:mr D: 07/28/2013 19:47:00 ET T: 07/28/2013 22:20:20 ET JOB#: [id number]  cc: Juliann Mule. Nobie Putnam, MD, <Dictator> Carroll Sage MD ELECTRONICALLY SIGNED 07/31/2013 15:41

## 2014-08-31 NOTE — H&P (Signed)
PATIENT NAME:  [name], [name] MR#:  [id number] DATE OF BIRTH:  08-25-1945  DATE OF ADMISSION:  07/28/2013  PRIMARY CARE PHYSICIAN: Dr. Emily Filbert.   CHIEF COMPLAINT: Left kidney stone.   HISTORY OF PRESENT ILLNESS: This is a very pleasant 69 year old female with a history of hypertension, diabetes, kidney stones, who presents with the above complaint. Over the past day, the patient has had left-sided back pain as well as nausea. She felt like this was a kidney stone. In fact, CT scan in the ER did show a large kidney stone, which is impacted. Urology has been consulted, and the plan is for a stent today. The patient is complaining of dysuria and some mild discoloration of her urine over the past few days. The patient has been taking AZO for this.   REVIEW OF SYSTEMS:  CONSTITUTIONAL: No fever. Positive nausea. No fatigue, weakness. EYES: No blurry or double vision, glaucoma or cataracts.  EARS, NOSE, THROAT: No ear pain, hearing loss, seasonal allergies, postnasal drip.  RESPIRATORY: No cough, wheezing, hemoptysis, COPD.  CARDIOVASCULAR: No chest pain, orthopnea, edema, palpitations, syncope.  GASTROINTESTINAL: Positive nausea. No vomiting, diarrhea, abdominal pain, melena or ulcers.  GENITOURINARY: Positive dysuria. Positive discoloration of her urine.  ENDOCRINE: No polyuria or polydipsia. HEMATOLOGIC AND LYMPHATIC: No anemia or easy bruising.  SKIN: No rashes or lesions.  MUSCULOSKELETAL: Positive arthritis. NEUROLOGIC: Positive history of CVA.  PSYCHIATRIC: No history of anxiety or depression.   PAST MEDICAL HISTORY:  1.  Sleep apnea. 2.  CAD. 3.  Kidney stone. 4.  Diabetes. 5.  Hypertension.  PAST SURGICAL HISTORY: 1.  Thumb surgery.  2.  Thyroidectomy. 3.  Kidney stone removal. 4.  Total left knee replacement.   MEDICATIONS:  1.  Toprol 100 mg BID.  2.  Lisinopril/HCTZ 20/25 daily.  3.  Simvastatin 20 mg daily.  4.  VICTOZA 1.8 mg daily.  5.  Etodolac 400 mg b.i.d.   6.  Calcium with vitamin D 600 mg daily.  7.  Vitamin B12, 500 mcg daily.  8.  Aspirin 81 mg daily.  9.  Aleve p.r.n. daily.  10. Metformin 1000 mg BID  ALLERGIES: No known drug allergies.   FAMILY HISTORY: Positive for diabetes and hypertension.   SOCIAL HISTORY: No tobacco, alcohol, or drug use.  PHYSICAL EXAMINATION:  VITAL SIGNS: Temperature is 97.8. Pulse is 84, respirations 20, blood pressure 104/63, 97% on room air. GENERAL: The patient is alert, oriented, not in acute distress.  HEENT: Head is atraumatic. Pupils are round and reactive. Sclerae anicteric Mucous membranes are moist. Oropharynx is clear.  NECK: Supple without JVD, carotid bruit, or enlarged thyroid. CARDIOVASCULAR: Regular rate and rhythm. No murmur, gallops, or rubs. PMI is not displaced. LUNGS: Clear to auscultation without crackles, rales, rhonchi, or wheezing. Normal percussion. ABDOMEN: Bowel sounds present. Nontender, nondistended. No hepatosplenomegaly. BACK: Positive left-sided CVA tenderness. No vertebral tenderness.  EXTREMITIES: No clubbing, cyanosis, or edema.  NEUROLOGIC: Cranial nerves II through XII are grossly intact. No focal deficits.  SKIN: Without rashes or lesions.   LABORATORY AND RADIOLOGICAL DATA: White blood cells 21.4, hemoglobin 14.8, hematocrit 43; platelets are pending.   Sodium 134, potassium 3.5, chloride 97, bicarbonate 28, BUN 32, creatinine 2.32, glucose 197, calcium 9.1, bilirubin 1.3, alkaline phosphatase 60, ALT 24, AST 24, total protein 6.2, albumin  2.9.   Urinalysis shows 3+ LCE, red blood cells 25, white blood cells 686.   CT of the abdomen and pelvis shows a 12 mm obstructing left  ureteral calculus with mild hydronephrosis; associated scattered foci of gas within the left renal collecting system; proximal ureter suggesting emphysematous pyelitis; decompression of the collecting system is suggested.   ASSESSMENT AND PLAN: A 69 year old female who presents with a 12 mm  obstructing left ureteral calculus with mild hydronephrosis.  1.  Left ureteral calculus with emphysematous pyelitis. Urology has been consulted. The plan is for a stent. In the meantime, we will place the patient on IV antibiotics, Rocephin. Urine culture has been ordered. Pain control and antiemetics.  2.  Diabetes. Will hold meds for now, as the patient will be n.p.o. Will continue sliding scale insulin.  3.  Hypertension. The patient's blood pressure is borderline. Will hold blood pressure medications for now. She may have impending systemic inflammatory response syndrome secondary to her problem #1.  4.  History of cerebrovascular accident. The patient will resume aspirin when it is okayed by urology.  5.  Arthritis: Will hold medications for now. Provide some p.r.n. pain medications.  6.  Sespsi secondary to her obstructing stone with elevated white blood cell count and borderline hypotension. Will provide antibiotics and monitor closely as she at high risk of decompensation after her stent procedure.  7.  Acute renal failure secondary to this 12 mm obstructing stone. I suspect that once the stent is placed, her creatinine will improve. Will hold any nephrotoxic agents. Repeat a BMP in the morning.  8.  The patient is a full code status.   TIME SPENT: Approximately 45 minutes.   ____________________________ Donell Beers. Benjie Karvonen, MD spm:jcm D: 07/28/2013 15:58:31 ET T: 07/28/2013 17:18:52 ET JOB#: [id number]  cc: Naitik Hermann P. Benjie Karvonen, MD, <Dictator> Rusty Aus, MD Sagar Tengan P Aniqua Briere MD ELECTRONICALLY SIGNED 07/28/2013 19:08

## 2014-08-31 NOTE — Consult Note (Signed)
Left ureteral stent placed by Dr. Valma Cava 3/21 for an obstructing left proximal ureteral stone with infection Flank pain much improved.  Mild-to-moderate irritative symptoms.  Episodes of nausea this morning. VSS, afebrile Urine culture growing greater than 100,000 gram-negative rods-final ID and sensitivity pending.  Impression: Improved status post stent placement for obstructing stone with infection  Recommendation: Continue IV antibiotics pending final ID and sensitivity.  Discharged on oral antibiotics.  Stone will be treated as an outpatient once infection treated.  KUB ordered today.  Electronic Signatures: Abbie Sons (MD)  (Signed on 23-Mar-15 08:08)  Authored  Last Updated: 23-Mar-15 08:08 by Abbie Sons (MD)

## 2014-08-31 NOTE — Discharge Summary (Signed)
PATIENT NAME:  [name], HASSEL MR#:  [id number] DATE OF BIRTH:  1946/02/20  DATE OF ADMISSION:  07/28/2013 DATE OF DISCHARGE:  08/01/2013   DISCHARGE DIAGNOSES:  1. Systemic inflammatory response syndrome/sepsis, secondary to left ureteral nephrolithiasis with obstruction.  2. Acute renal failure secondary to ureteral obstruction.  3. Gram-negative rod urinary tract infection.  4. Diabetes mellitus, noninsulin-requiring.  5. Hypertension.  6. Coronary artery disease.  7. Sleep apnea.   DISCHARGE MEDICATIONS:  1. Metoprolol 100 mg b.i.d. 2. Lisinopril/hydrochlorothiazide 20/12.5 daily.  3. Simvastatin 20 mg at bedtime.  4. Calcium with D daily.  5. Vitamin B12 daily.  6. Aspirin 81 mg daily.  7. Cefuroxime 500 mg b.i.d. for 1 week.  8. Potassium chloride 20 mEq daily for 5 days.   REASON FOR ADMISSION: A 69 year old female presents with acute renal failure, SIRS and left ureteral obstruction. Please see H and P for HPI, past medical history and physical exam.   HOSPITAL COURSE: The patient was admitted. A left ureteral stent was placed. Her creatinine, with hydration, came down from 2.3 to 1.8 on discharge. Her white count came from 21,000 down to 6000 with IV Rocephin. Gram-negative rod was found on urine culture, and she will be on Ceftin going home. Metformin, Victoza and anti-inflammatories were discontinued. She will use only Tylenol for pain. She will follow up with Dr. Jacqlyn Larsen for consideration of lithotripsy versus basket extraction. Overall prognosis is guarded. Her potassium is 2.1 on discharge and will be getting 5 days of potassium.    ____________________________ Rusty Aus, MD mfm:lb D: 08/01/2013 07:10:19 ET T: 08/01/2013 07:18:42 ET JOB#: [id number]  cc: Rusty Aus, MD, <Dictator> Rusty Aus MD ELECTRONICALLY SIGNED 08/02/2013 8:05

## 2014-11-13 DIAGNOSIS — E782 Mixed hyperlipidemia: Secondary | ICD-10-CM | POA: Diagnosis not present

## 2014-11-13 DIAGNOSIS — N390 Urinary tract infection, site not specified: Secondary | ICD-10-CM | POA: Diagnosis not present

## 2014-11-13 DIAGNOSIS — I10 Essential (primary) hypertension: Secondary | ICD-10-CM | POA: Diagnosis not present

## 2014-11-13 DIAGNOSIS — Z0000 Encounter for general adult medical examination without abnormal findings: Secondary | ICD-10-CM | POA: Diagnosis not present

## 2014-11-13 DIAGNOSIS — E118 Type 2 diabetes mellitus with unspecified complications: Secondary | ICD-10-CM | POA: Diagnosis not present

## 2014-11-27 DIAGNOSIS — E118 Type 2 diabetes mellitus with unspecified complications: Secondary | ICD-10-CM | POA: Diagnosis not present

## 2014-11-27 DIAGNOSIS — R197 Diarrhea, unspecified: Secondary | ICD-10-CM | POA: Diagnosis not present

## 2014-11-27 DIAGNOSIS — E782 Mixed hyperlipidemia: Secondary | ICD-10-CM | POA: Diagnosis not present

## 2014-11-27 DIAGNOSIS — Z79899 Other long term (current) drug therapy: Secondary | ICD-10-CM | POA: Diagnosis not present

## 2014-12-30 DIAGNOSIS — Z87898 Personal history of other specified conditions: Secondary | ICD-10-CM | POA: Diagnosis not present

## 2014-12-30 DIAGNOSIS — Z86010 Personal history of colonic polyps: Secondary | ICD-10-CM | POA: Diagnosis not present

## 2014-12-30 DIAGNOSIS — Z1211 Encounter for screening for malignant neoplasm of colon: Secondary | ICD-10-CM | POA: Diagnosis not present

## 2015-01-02 DIAGNOSIS — Z860101 Personal history of adenomatous and serrated colon polyps: Secondary | ICD-10-CM | POA: Insufficient documentation

## 2015-01-02 DIAGNOSIS — Z86010 Personal history of colonic polyps: Secondary | ICD-10-CM | POA: Insufficient documentation

## 2015-01-08 ENCOUNTER — Encounter: Payer: Self-pay | Admitting: *Deleted

## 2015-01-09 ENCOUNTER — Encounter
Admission: RE | Disposition: A | Discharge status: Home / Self Care | Payer: Self-pay | Source: Ambulatory Visit | Attending: Unknown Physician Specialty

## 2015-01-09 ENCOUNTER — Ambulatory Visit: Payer: Commercial Managed Care - HMO | Admitting: Anesthesiology

## 2015-01-09 ENCOUNTER — Ambulatory Visit
Admission: RE | Admit: 2015-01-09 | Discharge: 2015-01-09 | Disposition: A | Discharge status: Home / Self Care | Payer: Commercial Managed Care - HMO | Source: Ambulatory Visit | Attending: Unknown Physician Specialty | Admitting: Unknown Physician Specialty

## 2015-01-09 ENCOUNTER — Encounter: Payer: Self-pay | Admitting: *Deleted

## 2015-01-09 DIAGNOSIS — K5730 Diverticulosis of large intestine without perforation or abscess without bleeding: Secondary | ICD-10-CM | POA: Insufficient documentation

## 2015-01-09 DIAGNOSIS — R300 Dysuria: Secondary | ICD-10-CM | POA: Insufficient documentation

## 2015-01-09 DIAGNOSIS — I10 Essential (primary) hypertension: Secondary | ICD-10-CM | POA: Diagnosis not present

## 2015-01-09 DIAGNOSIS — K219 Gastro-esophageal reflux disease without esophagitis: Secondary | ICD-10-CM | POA: Diagnosis not present

## 2015-01-09 DIAGNOSIS — I69351 Hemiplegia and hemiparesis following cerebral infarction affecting right dominant side: Secondary | ICD-10-CM | POA: Diagnosis not present

## 2015-01-09 DIAGNOSIS — Z7982 Long term (current) use of aspirin: Secondary | ICD-10-CM | POA: Insufficient documentation

## 2015-01-09 DIAGNOSIS — D126 Benign neoplasm of colon, unspecified: Secondary | ICD-10-CM | POA: Diagnosis not present

## 2015-01-09 DIAGNOSIS — Z1211 Encounter for screening for malignant neoplasm of colon: Secondary | ICD-10-CM | POA: Insufficient documentation

## 2015-01-09 DIAGNOSIS — N393 Stress incontinence (female) (male): Secondary | ICD-10-CM | POA: Diagnosis not present

## 2015-01-09 DIAGNOSIS — K5289 Other specified noninfective gastroenteritis and colitis: Secondary | ICD-10-CM | POA: Diagnosis not present

## 2015-01-09 DIAGNOSIS — I4891 Unspecified atrial fibrillation: Secondary | ICD-10-CM | POA: Diagnosis not present

## 2015-01-09 DIAGNOSIS — E1122 Type 2 diabetes mellitus with diabetic chronic kidney disease: Secondary | ICD-10-CM | POA: Insufficient documentation

## 2015-01-09 DIAGNOSIS — D127 Benign neoplasm of rectosigmoid junction: Secondary | ICD-10-CM | POA: Diagnosis not present

## 2015-01-09 DIAGNOSIS — N189 Chronic kidney disease, unspecified: Secondary | ICD-10-CM | POA: Insufficient documentation

## 2015-01-09 DIAGNOSIS — I129 Hypertensive chronic kidney disease with stage 1 through stage 4 chronic kidney disease, or unspecified chronic kidney disease: Secondary | ICD-10-CM | POA: Diagnosis not present

## 2015-01-09 DIAGNOSIS — Z86010 Personal history of colonic polyps: Secondary | ICD-10-CM | POA: Insufficient documentation

## 2015-01-09 DIAGNOSIS — K649 Unspecified hemorrhoids: Secondary | ICD-10-CM | POA: Diagnosis not present

## 2015-01-09 DIAGNOSIS — M1990 Unspecified osteoarthritis, unspecified site: Secondary | ICD-10-CM | POA: Insufficient documentation

## 2015-01-09 DIAGNOSIS — M8580 Other specified disorders of bone density and structure, unspecified site: Secondary | ICD-10-CM | POA: Insufficient documentation

## 2015-01-09 DIAGNOSIS — K640 First degree hemorrhoids: Secondary | ICD-10-CM | POA: Insufficient documentation

## 2015-01-09 DIAGNOSIS — K635 Polyp of colon: Secondary | ICD-10-CM | POA: Insufficient documentation

## 2015-01-09 HISTORY — DX: Unspecified osteoarthritis, unspecified site: M19.90

## 2015-01-09 HISTORY — DX: Cerebral infarction, unspecified: I63.9

## 2015-01-09 HISTORY — DX: Dysuria: R30.0

## 2015-01-09 HISTORY — DX: Sleep apnea, unspecified: G47.30

## 2015-01-09 HISTORY — DX: Stress incontinence (female) (male): N39.3

## 2015-01-09 HISTORY — PX: COLONOSCOPY WITH PROPOFOL: SHX5780

## 2015-01-09 HISTORY — DX: Chronic kidney disease, unspecified: N18.9

## 2015-01-09 HISTORY — DX: Essential (primary) hypertension: I10

## 2015-01-09 HISTORY — DX: Type 2 diabetes mellitus without complications: E11.9

## 2015-01-09 HISTORY — DX: Other specified disorders of bone density and structure, unspecified site: M85.80

## 2015-01-09 LAB — GLUCOSE, CAPILLARY: Glucose-Capillary: 114 mg/dL — ABNORMAL HIGH (ref 65–99)

## 2015-01-09 SURGERY — COLONOSCOPY WITH PROPOFOL
Anesthesia: General

## 2015-01-09 MED ORDER — SODIUM CHLORIDE 0.9 % IV SOLN: INTRAVENOUS | Status: DC

## 2015-01-09 MED ORDER — FENTANYL CITRATE (PF) 100 MCG/2ML IJ SOLN
INTRAMUSCULAR | Status: DC | PRN
  Administered 2015-01-09: 50 ug via INTRAVENOUS

## 2015-01-09 MED ORDER — SODIUM CHLORIDE 0.9 % IV SOLN
INTRAVENOUS | Status: DC
  Administered 2015-01-09: 10:00:00 via INTRAVENOUS

## 2015-01-09 MED ORDER — PROPOFOL 10 MG/ML IV BOLUS
INTRAVENOUS | Status: DC | PRN
  Administered 2015-01-09: 50 mg via INTRAVENOUS

## 2015-01-09 MED ORDER — PROPOFOL INFUSION 10 MG/ML OPTIME
INTRAVENOUS | Status: DC | PRN
  Administered 2015-01-09: 140 ug/kg/min via INTRAVENOUS

## 2015-01-09 MED ORDER — PIPERACILLIN-TAZOBACTAM 3.375 G IVPB 30 MIN
3.3750 g | Freq: Once | INTRAVENOUS | Status: AC
  Administered 2015-01-09: 3.375 g via INTRAVENOUS
  Filled 2015-01-09: qty 50

## 2015-01-09 MED ORDER — MIDAZOLAM HCL 5 MG/5ML IJ SOLN
INTRAMUSCULAR | Status: DC | PRN
  Administered 2015-01-09: 1 mg via INTRAVENOUS

## 2015-01-09 MED ORDER — EPHEDRINE SULFATE 50 MG/ML IJ SOLN
INTRAMUSCULAR | Status: DC | PRN
  Administered 2015-01-09: 10 mg via INTRAVENOUS

## 2015-01-09 NOTE — Transfer of Care (Signed)
Immediate Anesthesia Transfer of Care Note  Patient: [name]  Procedure(s) Performed: Procedure(s): COLONOSCOPY WITH PROPOFOL (N/A)  Patient Location: PACU  Anesthesia Type:General  Level of Consciousness: awake, alert , oriented and patient cooperative  Airway & Oxygen Therapy: Patient Spontanous Breathing and Patient connected to nasal cannula oxygen  Post-op Assessment: Report given to RN and Post -op Vital signs reviewed and stable  Post vital signs: Reviewed and stable  Last Vitals:  Filed Vitals:   01/09/15 1142  BP: 119/74  Pulse:   Temp: 36.2 C  Resp: 17    Complications: No apparent anesthesia complications

## 2015-01-09 NOTE — Anesthesia Preprocedure Evaluation (Signed)
Anesthesia Evaluation  Patient identified by MRN, date of birth, ID band Patient awake    Reviewed: Allergy & Precautions, H&P , NPO status , Patient's Chart, lab work & pertinent test results, reviewed documented beta blocker date and time   History of Anesthesia Complications Negative for: history of anesthetic complications  Airway Mallampati: IV  TM Distance: >3 FB Neck ROM: full    Dental no notable dental hx. (+) Missing, Partial Upper   Pulmonary neg shortness of breath, sleep apnea (history, but resolved since 100lb weight loss) , neg COPDneg recent URI,  breath sounds clear to auscultation  Pulmonary exam normal       Cardiovascular Exercise Tolerance: Good hypertension, - angina- CAD, - Past MI, - Cardiac Stents and - CABG negative cardio ROS Normal cardiovascular exam+ dysrhythmias (1 episode after a prior anesthetic, none since that time) Atrial Fibrillation - Valvular Problems/MurmursRhythm:regular Rate:Normal     Neuro/Psych CVA (right sided weakness), Residual Symptoms negative psych ROS   GI/Hepatic Neg liver ROS, GERD-  Controlled,  Endo/Other  diabetes  Renal/GU CRFRenal disease  negative genitourinary   Musculoskeletal   Abdominal   Peds  Hematology negative hematology ROS (+)   Anesthesia Other Findings Past Medical History:   Hypertension                                                 Stroke                                                       Arthritis                                                    Diabetes mellitus without complication                       Chronic kidney disease                                       Osteopenia                                                   Sleep apnea                                                  Dysuria                                                      Urine, incontinence, stress female  Adenomatous colon polyp                                       Reproductive/Obstetrics negative OB ROS                             Anesthesia Physical Anesthesia Plan  ASA: III  Anesthesia Plan: General   Post-op Pain Management:    Induction:   Airway Management Planned:   Additional Equipment:   Intra-op Plan:   Post-operative Plan:   Informed Consent: I have reviewed the patients History and Physical, chart, labs and discussed the procedure including the risks, benefits and alternatives for the proposed anesthesia with the patient or authorized representative who has indicated his/her understanding and acceptance.   Dental Advisory Given  Plan Discussed with: Anesthesiologist, CRNA and Surgeon  Anesthesia Plan Comments:         Anesthesia Quick Evaluation

## 2015-01-09 NOTE — H&P (Signed)
   Primary Care Physician:  Rusty Aus., MD Primary Gastroenterologist:  Dr. Vira Agar  Pre-Procedure History & Physical: HPI:  [name] is a 69 y.o. female is here for an colonoscopy.   Past Medical History  Diagnosis Date  . Hypertension   . Stroke   . Arthritis   . Diabetes mellitus without complication   . Chronic kidney disease   . Osteopenia   . Sleep apnea   . Dysuria   . Urine, incontinence, stress female   . Adenomatous colon polyp     Past Surgical History  Procedure Laterality Date  . Joint replacement    . Ltk Left   . Throidectomy    . Lithotripsy    . Colonoscopy w/ polypectomy Right     Prior to Admission medications   Medication Sig Start Date End Date Taking? Authorizing Provider  allopurinol (ZYLOPRIM) 100 MG tablet Take 100 mg by mouth daily.   Yes Historical Provider, MD  aspirin 81 MG tablet Take 81 mg by mouth daily.   Yes Historical Provider, MD  calcium carbonate (OS-CAL) 600 MG TABS tablet Take 600 mg by mouth 2 (two) times daily with a meal.   Yes Historical Provider, MD  ibuprofen (ADVIL,MOTRIN) 600 MG tablet Take 600 mg by mouth every 6 (six) hours as needed.   Yes Historical Provider, MD  lisinopril-hydrochlorothiazide (PRINZIDE,ZESTORETIC) 20-12.5 MG per tablet Take 1 tablet by mouth daily.   Yes Historical Provider, MD  metoprolol (LOPRESSOR) 100 MG tablet Take 100 mg by mouth 2 (two) times daily.   Yes Historical Provider, MD  simvastatin (ZOCOR) 20 MG tablet Take 20 mg by mouth daily.   Yes Historical Provider, MD  vitamin B-12 (CYANOCOBALAMIN) 500 MCG tablet Take 500 mcg by mouth daily.   Yes Historical Provider, MD    Allergies as of 12/31/2014  . (Not on File)    History reviewed. No pertinent family history.  Social History   Social History  . Marital Status: Married    Spouse Name: N/A  . Number of Children: N/A  . Years of Education: N/A   Occupational History  . Not on file.   Social History Main Topics   . Smoking status: Never Smoker   . Smokeless tobacco: Never Used  . Alcohol Use: No  . Drug Use: No  . Sexual Activity: Not on file   Other Topics Concern  . Not on file   Social History Narrative    Review of Systems: See HPI, otherwise negative ROS  Physical Exam: BP 157/82 mmHg  Pulse 62  Temp(Src) 98.4 F (36.9 C) (Tympanic)  Resp 12  Ht 5\' 4"  (1.626 m)  Wt 93.895 kg (207 lb)  BMI 35.51 kg/m2  SpO2 99% General:   Alert,  pleasant and cooperative in NAD Head:  Normocephalic and atraumatic. Neck:  Supple; no masses or thyromegaly. Lungs:  Clear throughout to auscultation.    Heart:  Regular rate and rhythm. Abdomen:  Soft, nontender and nondistended. Normal bowel sounds, without guarding, and without rebound.   Neurologic:  Alert and  oriented x4;  grossly normal neurologically.  Impression/Plan: [name] is here for an colonoscopy to be performed for Personal hx of colon polyps  Risks, benefits, limitations, and alternatives regarding  colonoscopy have been reviewed with the patient.  Questions have been answered.  All parties agreeable.   Gaylyn Cheers, MD  01/09/2015, 11:12 AM

## 2015-01-09 NOTE — Anesthesia Postprocedure Evaluation (Signed)
  Anesthesia Post-op Note  Patient: [name]  Procedure(s) Performed: Procedure(s): COLONOSCOPY WITH PROPOFOL (N/A)  Anesthesia type:General  Patient location: PACU  Post pain: Pain level controlled  Post assessment: Post-op Vital signs reviewed, Patient's Cardiovascular Status Stable, Respiratory Function Stable, Patent Airway and No signs of Nausea or vomiting  Post vital signs: Reviewed and stable  Last Vitals:  Filed Vitals:   01/09/15 1220  BP: 161/81  Pulse: 60  Temp:   Resp: 14    Level of consciousness: awake, alert  and patient cooperative  Complications: No apparent anesthesia complications

## 2015-01-09 NOTE — Op Note (Signed)
[facility] Gastroenterology Patient Name: [name] Procedure Date: 01/09/2015 11:14 AM MRN: [id number] Account #: [id number] Date of Birth: 1945/09/25 Admit Type: Outpatient Age: 69 Room: [facility] ENDO ROOM 1 Gender: Female Note Status: Finalized Procedure:         Colonoscopy Indications:       Personal history of colonic polyps Providers:         Manya Silvas, MD Referring MD:      Rusty Aus, MD (Referring MD) Medicines:         Propofol per Anesthesia Complications:     No immediate complications. Procedure:         Pre-Anesthesia Assessment:                    - After reviewing the risks and benefits, the patient was                     deemed in satisfactory condition to undergo the procedure.                    After obtaining informed consent, the colonoscope was                     passed under direct vision. Throughout the procedure, the                     patient's blood pressure, pulse, and oxygen saturations                     were monitored continuously. The Colonoscope was                     introduced through the anus and advanced to the the cecum,                     identified by appendiceal orifice and ileocecal valve. The                     colonoscopy was performed without difficulty. The patient                     tolerated the procedure well. The quality of the bowel                     preparation was good. Findings:      A small polyp was found in the recto-sigmoid colon. The polyp was       sessile. The polyp was removed with a hot snare. Resection and retrieval       were complete.      Multiple medium-mouthed diverticula were found in the sigmoid colon and       in the descending colon.      Internal hemorrhoids were found during endoscopy. The hemorrhoids were       small and Grade I (internal hemorrhoids that do not prolapse).      The exam was otherwise without abnormality. Impression:        - One small polyp at  the recto-sigmoid colon. Resected and                     retrieved.                    - Diverticulosis in the sigmoid colon and in the  descending colon.                    - Internal hemorrhoids.                    - The examination was otherwise normal. Recommendation:    - Await pathology results. Manya Silvas, MD 01/09/2015 11:37:02 AM This report has been signed electronically. Number of Addenda: 0 Note Initiated On: 01/09/2015 11:14 AM Scope Withdrawal Time: 0 hours 8 minutes 28 seconds  Total Procedure Duration: 0 hours 14 minutes 52 seconds       [facility]

## 2015-01-10 DIAGNOSIS — E119 Type 2 diabetes mellitus without complications: Secondary | ICD-10-CM | POA: Diagnosis not present

## 2015-01-10 LAB — SURGICAL PATHOLOGY

## 2015-02-16 ENCOUNTER — Emergency Department
Admission: EM | Admit: 2015-02-16 | Discharge: 2015-02-16 | Disposition: A | Discharge status: Home / Self Care | Payer: Commercial Managed Care - HMO | Attending: Emergency Medicine | Admitting: Emergency Medicine

## 2015-02-16 ENCOUNTER — Encounter: Payer: Self-pay | Admitting: Medical Oncology

## 2015-02-16 DIAGNOSIS — N189 Chronic kidney disease, unspecified: Secondary | ICD-10-CM | POA: Insufficient documentation

## 2015-02-16 DIAGNOSIS — E119 Type 2 diabetes mellitus without complications: Secondary | ICD-10-CM | POA: Insufficient documentation

## 2015-02-16 DIAGNOSIS — Z79899 Other long term (current) drug therapy: Secondary | ICD-10-CM | POA: Diagnosis not present

## 2015-02-16 DIAGNOSIS — Z7982 Long term (current) use of aspirin: Secondary | ICD-10-CM | POA: Insufficient documentation

## 2015-02-16 DIAGNOSIS — I129 Hypertensive chronic kidney disease with stage 1 through stage 4 chronic kidney disease, or unspecified chronic kidney disease: Secondary | ICD-10-CM | POA: Diagnosis not present

## 2015-02-16 DIAGNOSIS — N390 Urinary tract infection, site not specified: Secondary | ICD-10-CM

## 2015-02-16 DIAGNOSIS — R300 Dysuria: Secondary | ICD-10-CM | POA: Diagnosis present

## 2015-02-16 LAB — COMPREHENSIVE METABOLIC PANEL
ALBUMIN: 3.3 g/dL — AB (ref 3.5–5.0)
ALT: 18 U/L (ref 14–54)
ANION GAP: 9 (ref 5–15)
AST: 25 U/L (ref 15–41)
Alkaline Phosphatase: 53 U/L (ref 38–126)
BUN: 32 mg/dL — AB (ref 6–20)
CHLORIDE: 98 mmol/L — AB (ref 101–111)
CO2: 25 mmol/L (ref 22–32)
Calcium: 9.5 mg/dL (ref 8.9–10.3)
Creatinine, Ser: 1.75 mg/dL — ABNORMAL HIGH (ref 0.44–1.00)
GFR calc Af Amer: 33 mL/min — ABNORMAL LOW (ref >60)
GFR calc non Af Amer: 28 mL/min — ABNORMAL LOW (ref >60)
GLUCOSE: 119 mg/dL — AB (ref 65–99)
POTASSIUM: 3.9 mmol/L (ref 3.5–5.1)
SODIUM: 132 mmol/L — AB (ref 135–145)
Total Bilirubin: 0.8 mg/dL (ref 0.3–1.2)
Total Protein: 6.4 g/dL — ABNORMAL LOW (ref 6.5–8.1)

## 2015-02-16 LAB — URINALYSIS COMPLETE WITH MICROSCOPIC (ARMC ONLY)
BILIRUBIN URINE: NEGATIVE
Glucose, UA: 50 mg/dL — AB
KETONES UR: NEGATIVE mg/dL
Nitrite: NEGATIVE
PH: 5 (ref 5.0–8.0)
PROTEIN: 100 mg/dL — AB
Specific Gravity, Urine: 1.013 (ref 1.005–1.030)
Trans Epithel, UA: 7

## 2015-02-16 LAB — CBC
HEMATOCRIT: 42.7 % (ref 35.0–47.0)
HEMOGLOBIN: 14.1 g/dL (ref 12.0–16.0)
MCH: 29.7 pg (ref 26.0–34.0)
MCHC: 33.1 g/dL (ref 32.0–36.0)
MCV: 89.6 fL (ref 80.0–100.0)
Platelets: 207 10*3/uL (ref 150–440)
RBC: 4.76 MIL/uL (ref 3.80–5.20)
RDW: 13.5 % (ref 11.5–14.5)
WBC: 7.3 10*3/uL (ref 3.6–11.0)

## 2015-02-16 LAB — LIPASE, BLOOD: LIPASE: 37 U/L (ref 22–51)

## 2015-02-16 MED ORDER — PHENAZOPYRIDINE HCL 200 MG PO TABS: 200.0000 mg | ORAL_TABLET | Freq: Three times a day (TID) | ORAL | Status: AC | PRN

## 2015-02-16 MED ORDER — CEPHALEXIN 500 MG PO CAPS
1000.0000 mg | ORAL_CAPSULE | Freq: Once | ORAL | Status: AC
  Administered 2015-02-16: 1000 mg via ORAL
  Filled 2015-02-16: qty 2

## 2015-02-16 MED ORDER — PHENAZOPYRIDINE HCL 200 MG PO TABS
200.0000 mg | ORAL_TABLET | Freq: Once | ORAL | Status: AC
  Administered 2015-02-16: 200 mg via ORAL
  Filled 2015-02-16: qty 1

## 2015-02-16 MED ORDER — CEPHALEXIN 500 MG PO CAPS: 500.0000 mg | ORAL_CAPSULE | Freq: Three times a day (TID) | ORAL | Status: AC

## 2015-02-16 NOTE — ED Notes (Signed)
Pt reports that she has been having pain with urination and frequency x 2 days. Also reports some lower abd pain and flank pain.

## 2015-02-16 NOTE — ED Provider Notes (Signed)
[facility] Emergency Department Provider Note  ____________________________________________  Time seen: Approximately 3:14 PM  I have reviewed the triage vital signs and the nursing notes.   HISTORY  Chief Complaint Dysuria    HPI [name] is a 69 y.o. female patient reports 2 days of dysuria urgency frequency. She reports a little bit of lower abdominal discomfort with it.. She reports she's had bladder or kidney infections before. She thinks that this is the same thing. nothing really makes it better or worse. She denies any fever or vomiting.   Past Medical History  Diagnosis Date  . Hypertension   . Stroke ([place])   . Arthritis   . Diabetes mellitus without complication ([place])   . Chronic kidney disease   . Osteopenia   . Sleep apnea   . Dysuria   . Urine, incontinence, stress female   . Adenomatous colon polyp     There are no active problems to display for this patient.   Past Surgical History  Procedure Laterality Date  . Joint replacement    . Ltk Left   . Throidectomy    . Lithotripsy    . Colonoscopy w/ polypectomy Right   . Colonoscopy with propofol N/A 01/09/2015    Procedure: COLONOSCOPY WITH PROPOFOL;  Surgeon: Manya Silvas, MD;  Location: [facility] ENDOSCOPY;  Service: Endoscopy;  Laterality: N/A;    Current Outpatient Rx  Name  Route  Sig  Dispense  Refill  . allopurinol (ZYLOPRIM) 100 MG tablet   Oral   Take 100 mg by mouth daily.         Marland Kitchen aspirin 81 MG tablet   Oral   Take 81 mg by mouth daily.         . calcium carbonate (OS-CAL) 600 MG TABS tablet   Oral   Take 600 mg by mouth 2 (two) times daily with a meal.         . cephALEXin (KEFLEX) 500 MG capsule   Oral   Take 1 capsule (500 mg total) by mouth 3 (three) times daily.   30 capsule   0   . ibuprofen (ADVIL,MOTRIN) 600 MG tablet   Oral   Take 600 mg by mouth every 6 (six) hours as needed.         Marland Kitchen lisinopril-hydrochlorothiazide  (PRINZIDE,ZESTORETIC) 20-12.5 MG per tablet   Oral   Take 1 tablet by mouth daily.         . metoprolol (LOPRESSOR) 100 MG tablet   Oral   Take 100 mg by mouth 2 (two) times daily.         . phenazopyridine (PYRIDIUM) 200 MG tablet   Oral   Take 1 tablet (200 mg total) by mouth 3 (three) times daily as needed for pain.   9 tablet   0   . simvastatin (ZOCOR) 20 MG tablet   Oral   Take 20 mg by mouth daily.         . vitamin B-12 (CYANOCOBALAMIN) 500 MCG tablet   Oral   Take 500 mcg by mouth daily.           Allergies Review of patient's allergies indicates no known allergies.  No family history on file.  Social History Social History  Substance Use Topics  . Smoking status: Never Smoker   . Smokeless tobacco: Never Used  . Alcohol Use: No    Review of Systems Constitutional: No fever/chills Eyes: No visual changes. ENT: No sore  throat. Cardiovascular: Denies chest pain. Respiratory: Denies shortness of breath. Gastrointestinal: No abdominal pain.  No nausea, no vomiting.  No diarrhea.  No constipation. Genitourinary: See history of present illness Musculoskeletal: Negative for back pain. Skin: Negative for rash. Neurological: Negative for headaches, focal weakness or numbness.  10-point ROS otherwise negative.  ____________________________________________   PHYSICAL EXAM:  VITAL SIGNS: ED Triage Vitals  Enc Vitals Group     BP 02/16/15 1154 164/97 mmHg     Pulse Rate 02/16/15 1154 65     Resp 02/16/15 1154 18     Temp 02/16/15 1154 98.2 F (36.8 C)     Temp Source 02/16/15 1154 Oral     SpO2 02/16/15 1154 97 %     Weight 02/16/15 1154 200 lb (90.719 kg)     Height 02/16/15 1154 5\' 4"  (1.626 m)     Head Cir --      Peak Flow --      Pain Score 02/16/15 1154 4     Pain Loc --      Pain Edu? --      Excl. in [place]? --     Constitutional: Alert and oriented. Well appearing and in no acute distress. Eyes: Conjunctivae are normal. PERRL.  EOMI. Head: Atraumatic. Nose: No congestion/rhinnorhea. Mouth/Throat: Mucous membranes are moist.  Oropharynx non-erythematous. Neck: No stridor.   Cardiovascular: Normal rate, regular rhythm. Grossly normal heart sounds.  Good peripheral circulation. Respiratory: Normal respiratory effort.  No retractions. Lungs CTAB. Gastrointestinal: Soft and nontender. No distention. No abdominal bruits. No CVA tenderness. Musculoskeletal: No lower extremity tenderness nor edema.  No joint effusions. Neurologic:  Normal speech and language. No gross focal neurologic deficits are appreciated. No gait instability. Skin:  Skin is warm, dry and intact.   ____________________________________________   LABS (all labs ordered are listed, but only abnormal results are displayed)  Labs Reviewed  COMPREHENSIVE METABOLIC PANEL - Abnormal; Notable for the following:    Sodium 132 (*)    Chloride 98 (*)    Glucose, Bld 119 (*)    BUN 32 (*)    Creatinine, Ser 1.75 (*)    Total Protein 6.4 (*)    Albumin 3.3 (*)    GFR calc non Af Amer 28 (*)    GFR calc Af Amer 33 (*)    All other components within normal limits  URINALYSIS COMPLETEWITH MICROSCOPIC ([facility] ONLY) - Abnormal; Notable for the following:    Color, Urine YELLOW (*)    APPearance HAZY (*)    Glucose, UA 50 (*)    Hgb urine dipstick 1+ (*)    Protein, ur 100 (*)    Leukocytes, UA 3+ (*)    Bacteria, UA RARE (*)    Squamous Epithelial / LPF 6-30 (*)    All other components within normal limits  LIPASE, BLOOD  CBC   ____________________________________________  EKG   ____________________________________________  RADIOLOGY   ____________________________________________   PROCEDURES    ____________________________________________   INITIAL IMPRESSION / ASSESSMENT AND PLAN / ED COURSE  Pertinent labs & imaging results that were available during my care of the patient were reviewed by me and considered in my medical decision  making (see chart for details).   ____________________________________________   FINAL CLINICAL IMPRESSION(S) / ED DIAGNOSES  Final diagnoses:  Urinary tract infection without hematuria, site unspecified      Nena Polio, MD 02/16/15 1643

## 2015-03-17 DIAGNOSIS — Z23 Encounter for immunization: Secondary | ICD-10-CM | POA: Diagnosis not present

## 2015-04-15 DIAGNOSIS — Z124 Encounter for screening for malignant neoplasm of cervix: Secondary | ICD-10-CM | POA: Diagnosis not present

## 2015-04-15 DIAGNOSIS — Z01419 Encounter for gynecological examination (general) (routine) without abnormal findings: Secondary | ICD-10-CM | POA: Diagnosis not present

## 2015-05-22 DIAGNOSIS — E118 Type 2 diabetes mellitus with unspecified complications: Secondary | ICD-10-CM | POA: Diagnosis not present

## 2015-05-22 DIAGNOSIS — Z79899 Other long term (current) drug therapy: Secondary | ICD-10-CM | POA: Diagnosis not present

## 2015-05-29 ENCOUNTER — Other Ambulatory Visit: Payer: Self-pay | Admitting: Internal Medicine

## 2015-05-29 DIAGNOSIS — E118 Type 2 diabetes mellitus with unspecified complications: Secondary | ICD-10-CM | POA: Diagnosis not present

## 2015-05-29 DIAGNOSIS — Z0000 Encounter for general adult medical examination without abnormal findings: Secondary | ICD-10-CM | POA: Diagnosis not present

## 2015-05-29 DIAGNOSIS — Z1231 Encounter for screening mammogram for malignant neoplasm of breast: Secondary | ICD-10-CM

## 2015-05-29 DIAGNOSIS — N184 Chronic kidney disease, stage 4 (severe): Secondary | ICD-10-CM | POA: Insufficient documentation

## 2015-06-02 ENCOUNTER — Ambulatory Visit
Admission: RE | Admit: 2015-06-02 | Discharge: 2015-06-02 | Disposition: A | Discharge status: Home / Self Care | Payer: Commercial Managed Care - HMO | Source: Ambulatory Visit | Attending: Internal Medicine | Admitting: Internal Medicine

## 2015-06-02 DIAGNOSIS — N189 Chronic kidney disease, unspecified: Secondary | ICD-10-CM | POA: Diagnosis not present

## 2015-06-02 DIAGNOSIS — N184 Chronic kidney disease, stage 4 (severe): Secondary | ICD-10-CM

## 2015-06-16 ENCOUNTER — Ambulatory Visit
Admission: RE | Admit: 2015-06-16 | Discharge: 2015-06-16 | Disposition: A | Discharge status: Home / Self Care | Payer: Commercial Managed Care - HMO | Source: Ambulatory Visit | Attending: Internal Medicine | Admitting: Internal Medicine

## 2015-06-16 DIAGNOSIS — Z1231 Encounter for screening mammogram for malignant neoplasm of breast: Secondary | ICD-10-CM

## 2015-06-30 DIAGNOSIS — N184 Chronic kidney disease, stage 4 (severe): Secondary | ICD-10-CM | POA: Diagnosis not present

## 2015-06-30 DIAGNOSIS — E118 Type 2 diabetes mellitus with unspecified complications: Secondary | ICD-10-CM | POA: Diagnosis not present

## 2015-07-05 ENCOUNTER — Encounter: Payer: Self-pay | Admitting: Emergency Medicine

## 2015-07-05 ENCOUNTER — Emergency Department: Payer: Commercial Managed Care - HMO

## 2015-07-05 ENCOUNTER — Emergency Department
Admission: EM | Admit: 2015-07-05 | Discharge: 2015-07-05 | Disposition: A | Discharge status: Home / Self Care | Payer: Commercial Managed Care - HMO | Attending: Emergency Medicine | Admitting: Emergency Medicine

## 2015-07-05 DIAGNOSIS — E119 Type 2 diabetes mellitus without complications: Secondary | ICD-10-CM | POA: Diagnosis not present

## 2015-07-05 DIAGNOSIS — S0083XA Contusion of other part of head, initial encounter: Secondary | ICD-10-CM | POA: Diagnosis not present

## 2015-07-05 DIAGNOSIS — W010XXA Fall on same level from slipping, tripping and stumbling without subsequent striking against object, initial encounter: Secondary | ICD-10-CM | POA: Diagnosis not present

## 2015-07-05 DIAGNOSIS — Y92000 Kitchen of unspecified non-institutional (private) residence as  the place of occurrence of the external cause: Secondary | ICD-10-CM | POA: Insufficient documentation

## 2015-07-05 DIAGNOSIS — I129 Hypertensive chronic kidney disease with stage 1 through stage 4 chronic kidney disease, or unspecified chronic kidney disease: Secondary | ICD-10-CM | POA: Diagnosis not present

## 2015-07-05 DIAGNOSIS — Z7982 Long term (current) use of aspirin: Secondary | ICD-10-CM | POA: Diagnosis not present

## 2015-07-05 DIAGNOSIS — S0011XA Contusion of right eyelid and periocular area, initial encounter: Secondary | ICD-10-CM | POA: Diagnosis not present

## 2015-07-05 DIAGNOSIS — N189 Chronic kidney disease, unspecified: Secondary | ICD-10-CM | POA: Insufficient documentation

## 2015-07-05 DIAGNOSIS — S0990XA Unspecified injury of head, initial encounter: Secondary | ICD-10-CM | POA: Diagnosis not present

## 2015-07-05 DIAGNOSIS — Z79899 Other long term (current) drug therapy: Secondary | ICD-10-CM | POA: Diagnosis not present

## 2015-07-05 DIAGNOSIS — Y9389 Activity, other specified: Secondary | ICD-10-CM | POA: Diagnosis not present

## 2015-07-05 DIAGNOSIS — Y998 Other external cause status: Secondary | ICD-10-CM | POA: Diagnosis not present

## 2015-07-05 NOTE — Discharge Instructions (Signed)
Facial or Scalp Contusion A facial or scalp contusion is a deep bruise on the face or head. Injuries to the face and head generally cause a lot of swelling, especially around the eyes. Contusions are the result of an injury that caused bleeding under the skin. The contusion may turn blue, purple, or yellow. Minor injuries will give you a painless contusion, but more severe contusions may stay painful and swollen for a few weeks.  CAUSES  A facial or scalp contusion is caused by a blunt injury or trauma to the face or head area.  SIGNS AND SYMPTOMS   Swelling of the injured area.   Discoloration of the injured area.   Tenderness, soreness, or pain in the injured area.  DIAGNOSIS  The diagnosis can be made by taking a medical history and doing a physical exam. An X-ray exam, CT scan, or MRI may be needed to determine if there are any associated injuries, such as broken bones (fractures). TREATMENT  Often, the best treatment for a facial or scalp contusion is applying cold compresses to the injured area. Over-the-counter medicines may also be recommended for pain control.  HOME CARE INSTRUCTIONS   Only take over-the-counter or prescription medicines as directed by your health care provider.   Apply ice to the injured area.   Put ice in a plastic bag.   Place a towel between your skin and the bag.   Leave the ice on for 20 minutes, 2-3 times a day.  SEEK MEDICAL CARE IF:  You have bite problems.   You have pain with chewing.   You are concerned about facial defects. SEEK IMMEDIATE MEDICAL CARE IF:  You have severe pain or a headache that is not relieved by medicine.   You have unusual sleepiness, confusion, or personality changes.   You throw up (vomit).   You have a persistent nosebleed.   You have double vision or blurred vision.   You have fluid drainage from your nose or ear.   You have difficulty walking or using your arms or legs.  MAKE SURE YOU:    Understand these instructions.  Will watch your condition.  Will get help right away if you are not doing well or get worse.   This information is not intended to replace advice given to you by your health care provider. Make sure you discuss any questions you have with your health care provider.   Document Released: 06/03/2004 Document Revised: 05/17/2014 Document Reviewed: 12/07/2012 Elsevier Interactive Patient Education 2016 Elsevier [employer]  Hematoma A hematoma is a collection of blood under the skin, in an organ, in a body space, in a joint space, or in other tissue. The blood can clot to form a lump that you can see and feel. The lump is often firm and may sometimes become sore and tender. Most hematomas get better in a few days to weeks. However, some hematomas may be serious and require medical care. Hematomas can range in size from very small to very large. CAUSES  A hematoma can be caused by a blunt or penetrating injury. It can also be caused by spontaneous leakage from a blood vessel under the skin. Spontaneous leakage from a blood vessel is more likely to occur in older people, especially those taking blood thinners. Sometimes, a hematoma can develop after certain medical procedures. SIGNS AND SYMPTOMS   A firm lump on the body.  Possible pain and tenderness in the area.  Bruising.Blue, dark blue, purple-red, or yellowish skin  may appear at the site of the hematoma if the hematoma is close to the surface of the skin. For hematomas in deeper tissues or body spaces, the signs and symptoms may be subtle. For example, an intra-abdominal hematoma may cause abdominal pain, weakness, fainting, and shortness of breath. An intracranial hematoma may cause a headache or symptoms such as weakness, trouble speaking, or a change in consciousness. DIAGNOSIS  A hematoma can usually be diagnosed based on your medical history and a physical exam. Imaging tests may be needed if your health care  provider suspects a hematoma in deeper tissues or body spaces, such as the abdomen, head, or chest. These tests may include ultrasonography or a CT scan.  TREATMENT  Hematomas usually go away on their own over time. Rarely does the blood need to be drained out of the body. Large hematomas or those that may affect vital organs will sometimes need surgical drainage or monitoring. HOME CARE INSTRUCTIONS   Apply ice to the injured area:   Put ice in a plastic bag.   Place a towel between your skin and the bag.   Leave the ice on for 20 minutes, 2-3 times a day for the first 1 to 2 days.   After the first 2 days, switch to using warm compresses on the hematoma.   Elevate the injured area to help decrease pain and swelling. Wrapping the area with an elastic bandage may also be helpful. Compression helps to reduce swelling and promotes shrinking of the hematoma. Make sure the bandage is not wrapped too tight.   If your hematoma is on a lower extremity and is painful, crutches may be helpful for a couple days.   Only take over-the-counter or prescription medicines as directed by your health care provider. SEEK IMMEDIATE MEDICAL CARE IF:   You have increasing pain, or your pain is not controlled with medicine.   You have a fever.   You have worsening swelling or discoloration.   Your skin over the hematoma breaks or starts bleeding.   Your hematoma is in your chest or abdomen and you have weakness, shortness of breath, or a change in consciousness.  Your hematoma is on your scalp (caused by a fall or injury) and you have a worsening headache or a change in alertness or consciousness. MAKE SURE YOU:   Understand these instructions.  Will watch your condition.  Will get help right away if you are not doing well or get worse.   This information is not intended to replace advice given to you by your health care provider. Make sure you discuss any questions you have with your  health care provider.   Document Released: 12/09/2003 Document Revised: 12/27/2012 Document Reviewed: 10/04/2012 Elsevier Interactive Patient Education [employer].

## 2015-07-05 NOTE — ED Notes (Signed)
Reports tripping and fell today.  C/o hematoma to right eye.  Denies LOC

## 2015-07-05 NOTE — ED Provider Notes (Signed)
[facility] Emergency Department Provider Note  ____________________________________________  Time seen: Approximately 2:18 PM  I have reviewed the triage vital signs and the nursing notes.   HISTORY  Chief Complaint Fall    HPI [name] is a 70 y.o. female presents for evaluation swelling and bruising to the right periorbital area. Patient states that she reported tripping and falling home today in the kitchen by accident. States her forehead took a direct impact to the floor. Denies any loss consciousness. Alert and oriented to to the area. Patient states symptoms of somewhat gotten better with an ice pack. Rates pain as a 5/10 nonradiating. Scribe's pain is just the sharp stabbing fullness.   Past Medical History  Diagnosis Date  . Hypertension   . Stroke ([place])   . Arthritis   . Diabetes mellitus without complication ([place])   . Chronic kidney disease   . Osteopenia   . Sleep apnea   . Dysuria   . Urine, incontinence, stress female   . Adenomatous colon polyp     There are no active problems to display for this patient.   Past Surgical History  Procedure Laterality Date  . Joint replacement    . Ltk Left   . Throidectomy    . Lithotripsy    . Colonoscopy w/ polypectomy Right   . Colonoscopy with propofol N/A 01/09/2015    Procedure: COLONOSCOPY WITH PROPOFOL;  Surgeon: Manya Silvas, MD;  Location: [facility] ENDOSCOPY;  Service: Endoscopy;  Laterality: N/A;    Current Outpatient Rx  Name  Route  Sig  Dispense  Refill  . allopurinol (ZYLOPRIM) 100 MG tablet   Oral   Take 100 mg by mouth daily.         Marland Kitchen aspirin 81 MG tablet   Oral   Take 81 mg by mouth daily.         . calcium carbonate (OS-CAL) 600 MG TABS tablet   Oral   Take 600 mg by mouth 2 (two) times daily with a meal.         . ibuprofen (ADVIL,MOTRIN) 600 MG tablet   Oral   Take 600 mg by mouth every 6 (six) hours as needed.         Marland Kitchen  lisinopril-hydrochlorothiazide (PRINZIDE,ZESTORETIC) 20-12.5 MG per tablet   Oral   Take 1 tablet by mouth daily.         . metoprolol (LOPRESSOR) 100 MG tablet   Oral   Take 100 mg by mouth 2 (two) times daily.         . phenazopyridine (PYRIDIUM) 200 MG tablet   Oral   Take 1 tablet (200 mg total) by mouth 3 (three) times daily as needed for pain.   9 tablet   0   . simvastatin (ZOCOR) 20 MG tablet   Oral   Take 20 mg by mouth daily.         . vitamin B-12 (CYANOCOBALAMIN) 500 MCG tablet   Oral   Take 500 mcg by mouth daily.           Allergies Review of patient's allergies indicates no known allergies.  History reviewed. No pertinent family history.  Social History Social History  Substance Use Topics  . Smoking status: Never Smoker   . Smokeless tobacco: Never Used  . Alcohol Use: No    Review of Systems Constitutional: No fever/chills Eyes: No visual changes. Positive for pain around the right eye ENT: No sore throat.  Cardiovascular: Denies chest pain. Respiratory: Denies shortness of breath. Gastrointestinal: No abdominal pain.  No nausea, no vomiting.  No diarrhea.  No constipation. Genitourinary: Negative for dysuria. Musculoskeletal: Negative for back pain. Skin: Negative for rash. Neurological: Negative for headaches, focal weakness or numbness.  10-point ROS otherwise negative.  ____________________________________________   PHYSICAL EXAM:  VITAL SIGNS: ED Triage Vitals  Enc Vitals Group     BP 07/05/15 1408 184/88 mmHg     Pulse Rate 07/05/15 1408 67     Resp 07/05/15 1408 18     Temp 07/05/15 1408 98 F (36.7 C)     Temp Source 07/05/15 1408 Oral     SpO2 07/05/15 1408 96 %     Weight 07/05/15 1408 193 lb (87.544 kg)     Height 07/05/15 1408 5\' 4"  (1.626 m)     Head Cir --      Peak Flow --      Pain Score 07/05/15 1409 5     Pain Loc --      Pain Edu? --      Excl. in [place]? --     Constitutional: Alert and oriented 3.  Well appearing and in no acute distress. Eyes: Conjunctivae are normal. PERRL. EOMI. positive for periorbital tenderness around the right eye with obvious edema swelling and bruising noted. Head: Atraumatic. Neck: No stridor.   Cardiovascular: Normal rate, regular rhythm. Grossly normal heart sounds.  Good peripheral circulation. Respiratory: Normal respiratory effort.  No retractions. Lungs CTAB. Musculoskeletal: No lower extremity tenderness nor edema.  No joint effusions. Neurologic:  Normal speech and language. No gross focal neurologic deficits are appreciated. No gait instability. Skin:  Skin is warm, dry and intact. No rash noted. Psychiatric: Mood and affect are normal. Speech and behavior are normal.  ____________________________________________   LABS (all labs ordered are listed, but only abnormal results are displayed)  Labs Reviewed - No data to display ____________________________________________  RADIOLOGY  IMPRESSION: CT of the head: No acute intracranial abnormality is noted. Chronic white matter ischemic change is noted.  CT of the maxillofacial bones: No fracture is seen. Right supraorbital soft tissue hematoma is noted. ____________________________________________   PROCEDURES  Procedure(s) performed: None  Critical Care performed: No  ____________________________________________   INITIAL IMPRESSION / ASSESSMENT AND PLAN / ED COURSE  Pertinent labs & imaging results that were available during my care of the patient were reviewed by me and considered in my medical decision making (see chart for details).  Acute fall with facial contusion and right orbital hematoma. Reassurance provided to the patient and her son. Rx encourage patient to use Tylenol over-the-counter as compresses. Return to the ER with any worsening symptomology or acute vomiting. ____________________________________________   FINAL CLINICAL IMPRESSION(S) / ED DIAGNOSES  Final  diagnoses:  Facial contusion, initial encounter     This chart was dictated using voice recognition software/Dragon. Despite best efforts to proofread, errors can occur which can change the meaning. Any change was purely unintentional.   Arlyss Repress, PA-C 07/05/15 [place], MD 07/06/15 1332

## 2015-07-05 NOTE — ED Notes (Signed)
Swelling and bruising noted to right eye area.

## 2015-09-11 DIAGNOSIS — M10079 Idiopathic gout, unspecified ankle and foot: Secondary | ICD-10-CM | POA: Diagnosis not present

## 2015-09-11 DIAGNOSIS — N184 Chronic kidney disease, stage 4 (severe): Secondary | ICD-10-CM | POA: Diagnosis not present

## 2015-10-30 DIAGNOSIS — N390 Urinary tract infection, site not specified: Secondary | ICD-10-CM | POA: Diagnosis not present

## 2015-10-30 DIAGNOSIS — I10 Essential (primary) hypertension: Secondary | ICD-10-CM | POA: Diagnosis not present

## 2015-10-30 DIAGNOSIS — E119 Type 2 diabetes mellitus without complications: Secondary | ICD-10-CM | POA: Diagnosis not present

## 2015-12-22 DIAGNOSIS — E118 Type 2 diabetes mellitus with unspecified complications: Secondary | ICD-10-CM | POA: Diagnosis not present

## 2015-12-29 ENCOUNTER — Other Ambulatory Visit: Payer: Self-pay | Admitting: Internal Medicine

## 2015-12-29 DIAGNOSIS — M5416 Radiculopathy, lumbar region: Secondary | ICD-10-CM

## 2015-12-29 DIAGNOSIS — G4733 Obstructive sleep apnea (adult) (pediatric): Secondary | ICD-10-CM | POA: Insufficient documentation

## 2015-12-29 DIAGNOSIS — M5116 Intervertebral disc disorders with radiculopathy, lumbar region: Secondary | ICD-10-CM | POA: Diagnosis not present

## 2015-12-29 DIAGNOSIS — N184 Chronic kidney disease, stage 4 (severe): Secondary | ICD-10-CM | POA: Diagnosis not present

## 2015-12-29 DIAGNOSIS — E118 Type 2 diabetes mellitus with unspecified complications: Secondary | ICD-10-CM | POA: Diagnosis not present

## 2015-12-29 DIAGNOSIS — Z9989 Dependence on other enabling machines and devices: Secondary | ICD-10-CM

## 2015-12-29 DIAGNOSIS — M1000 Idiopathic gout, unspecified site: Secondary | ICD-10-CM | POA: Diagnosis not present

## 2016-01-06 ENCOUNTER — Ambulatory Visit
Admission: RE | Admit: 2016-01-06 | Discharge: 2016-01-06 | Disposition: A | Discharge status: Home / Self Care | Payer: Commercial Managed Care - HMO | Source: Ambulatory Visit | Attending: Internal Medicine | Admitting: Internal Medicine

## 2016-01-06 DIAGNOSIS — M5416 Radiculopathy, lumbar region: Secondary | ICD-10-CM

## 2016-01-06 DIAGNOSIS — M4806 Spinal stenosis, lumbar region: Secondary | ICD-10-CM | POA: Diagnosis not present

## 2016-02-03 DIAGNOSIS — M4806 Spinal stenosis, lumbar region: Secondary | ICD-10-CM | POA: Diagnosis not present

## 2016-02-03 DIAGNOSIS — M545 Low back pain: Secondary | ICD-10-CM | POA: Diagnosis not present

## 2016-02-03 DIAGNOSIS — M5416 Radiculopathy, lumbar region: Secondary | ICD-10-CM | POA: Diagnosis not present

## 2016-02-03 DIAGNOSIS — Z6834 Body mass index (BMI) 34.0-34.9, adult: Secondary | ICD-10-CM | POA: Diagnosis not present

## 2016-02-11 ENCOUNTER — Encounter: Payer: Self-pay | Admitting: Physical Therapy

## 2016-02-11 ENCOUNTER — Ambulatory Visit: Payer: Commercial Managed Care - HMO | Attending: Physical Medicine and Rehabilitation | Admitting: Physical Therapy

## 2016-02-11 DIAGNOSIS — M6281 Muscle weakness (generalized): Secondary | ICD-10-CM | POA: Insufficient documentation

## 2016-02-11 DIAGNOSIS — M5440 Lumbago with sciatica, unspecified side: Secondary | ICD-10-CM | POA: Diagnosis not present

## 2016-02-11 NOTE — Therapy (Signed)
[place] MAIN [facility] SERVICES [address] [place], [place], [address] Phone: [phone]   Fax:  [phone]  Physical Therapy Evaluation  Patient Details  Name: [name] MRN: [id number] Date of Birth: [birth year] Referring Provider: Newman Pies  Encounter Date: 02/11/2016      PT End of Session - 02/11/16 1400    Visit Number 1   Number of Visits 9   Date for PT Re-Evaluation 04-09-2016   Authorization Type g codes   PT Start Time 0145   PT Stop Time 0245   PT Time Calculation (min) 60 min   Equipment Utilized During Treatment Gait belt   Activity Tolerance Patient limited by pain;Patient limited by fatigue   Behavior During Therapy [facility] for tasks assessed/performed      Past Medical History:  Diagnosis Date  . Adenomatous colon polyp   . Arthritis   . Chronic kidney disease   . Diabetes mellitus without complication ([place])   . Dysuria   . Hypertension   . Osteopenia   . Sleep apnea   . Stroke ([place])   . Urine, incontinence, stress female     Past Surgical History:  Procedure Laterality Date  . COLONOSCOPY W/ POLYPECTOMY Right   . COLONOSCOPY WITH PROPOFOL N/A 01/09/2015   Procedure: COLONOSCOPY WITH PROPOFOL;  Surgeon: Manya Silvas, MD;  Location: [facility] ENDOSCOPY;  Service: Endoscopy;  Laterality: N/A;  . JOINT REPLACEMENT    . LITHOTRIPSY    . ltk Left   . throidectomy      There were no vitals filed for this visit.       Subjective Assessment - 02/11/16 1356    Subjective Patient is having low back pain for years but 71 months ago it got worse and now she is taking pain medication   Pertinent History CVA, diabetes, high blood pressure, kidney surgery, thyroid surgery, left knee replacement,   Limitations Walking;Standing   How long can you sit comfortably? a long time   How long can you stand comfortably? not very long   How long can you walk comfortably? not very far using a walker   Patient  Stated Goals to have less pain and to walk more   Currently in Pain? Yes   Pain Score 7    Pain Location Back            [facility] PT Assessment - 02/11/16 0001      Assessment   Medical Diagnosis LBP   Referring Provider JENKINS, JEFFREY   Onset Date/Surgical Date 02/03/16   Hand Dominance Right   Next MD Visit 05/25/16   Prior Therapy --  balance      Precautions   Precautions None     Restrictions   Weight Bearing Restrictions No     Balance Screen   Has the patient fallen in the past 6 months No   Has the patient had a decrease in activity level because of a fear of falling?  Yes   Is the patient reluctant to leave their home because of a fear of falling?  No     Home Ecologist residence   Living Arrangements Spouse/significant other   Available Help at Discharge Family   Type of [place] to enter   Entrance Stairs-Number of Steps [place] One level   [place] - 2 wheels;Toilet riser;Grab bars -  toilet;Grab bars - tub/shower;Shower seat     Prior Function   Level of Independence Independent with basic ADLs;Requires assistive device for independence   Vocation Retired   Leisure read, watch TV     Cognition   Overall Cognitive Status Within Functional Limits for tasks assessed   Attention Focused   Focused Attention Appears intact   Memory Appears intact   Awareness Appears intact      PAIN: Patient has constant pain that ranges from 2/10 - 10/10  POSTURE: fwd flex trunk    PROM/AROM: WFL BLE Trunk flex limited by pain and spasms to 25% fwd flex Trunk extension limited by pain and spasms   STRENGTH:  Graded on a 0-5 scale Muscle Group Left Right                          Hip Flex 2+/5 2+/5  Hip Abd * *  Hip Add * *  Hip Ext * *  Hip IR/ER    Knee Flex 4/5 4/5  Knee Ext 4/5 4/5  Ankle DF 4/5 4/5  Ankle PF 4/5 4/5  *unable to test  secondary patient is unable to get into position to be tested due to pain/ muscle spasms SENSATION: numbness in BLE feet   SPECIAL TESTS: unable to perform SLR, prone knee flex , FABER, or spring test due to inability to be in supine or prone position   FUNCTIONAL MOBILITY: Patient was having spasms in her back during sit to supine mobility and rolling and pain increases to 10/10   BALANCE: poor static and dynamic standing balance   GAIT:  Ambulates with RW  and slow gait speed OUTCOME MEASURES: TEST Outcome Interpretation  5 times sit<>stand 43.08sec >71 yo, >15 sec indicates increased risk for falls  10 meter walk test    . 34             m/s <1.0 m/s indicates increased risk for falls; limited community ambulator  Timed up and Go   35.12              sec <14 sec indicates increased risk for falls  6 minute walk test   300             Feet 1000 feet is community ambulator            Therapeutic activities: Performed bed mobility supine to sit and sit to supine with max assist and back spasms. Reviewed POC                       PT Education - 02/11/16 1359    Education provided Yes   Education Details plan of care   Person(s) Educated Patient   Methods Explanation   Comprehension Verbalized understanding             PT Long Term Goals - 02/11/16 1435      PT LONG TERM GOAL #1   Title Patient will be independent in home exercise program to improve strength/mobility for better functional independence with ADLs.   Baseline greater than 30 sec   Time 8   Period Weeks   Status New     PT LONG TERM GOAL #2   Title Patient (> 71 years old) will complete five times sit to stand test in < 15 seconds indicating an increased LE strength and improved balance.   Baseline greater than 30 sec   Time  8   Period Weeks   Status New     PT LONG TERM GOAL #3   Title Patient will increase six minute walk test distance to >1000 for progression to community  ambulator and improve gait ability   Baseline 300 feet   Time 8   Period Weeks   Status New     PT LONG TERM GOAL #4   Title Patient will report a worst pain of 3/10 on VAS in low back   to improve tolerance with ADLs and reduced symptoms with activities.    Baseline 7/10   Time 4   Period Weeks     PT LONG TERM GOAL #5   Title  Patient will be able to perform household work/ chores without increase in symptoms.   Time 8   Period Weeks   Status New               Plan - 02/19/16 1402    Clinical Impression Statement Patient  presents to clinci with LBP that is 7/10 and she is limited with ambulation short distances due to pain. She ambulates with RW indoors . She has decreased strength BLE and tenderness to palpation to low back paraspinal muscles. She has increased falls risk and decreased outcome measures that indicate falls risk.    Rehab Potential Fair   Clinical Impairments Affecting Rehab Potential DM, decreased balance   PT Frequency 2x / week   PT Duration 4 weeks   PT Treatment/Interventions Cryotherapy;Aquatic Therapy;Electrical Stimulation;Moist Heat;Traction;Ultrasound;Therapeutic activities;Therapeutic exercise;Balance training;Manual techniques;Gait training   PT Next Visit Plan Therapeutic exercise and manual therapy   Consulted and Agree with Plan of Care Patient      Patient will benefit from skilled therapeutic intervention in order to improve the following deficits and impairments:  Decreased balance, Decreased mobility, Difficulty walking, Decreased range of motion, Decreased strength, Impaired flexibility, Pain, Obesity, Impaired sensation  Visit Diagnosis: Midline low back pain with sciatica, sciatica laterality unspecified, unspecified chronicity      G-Codes - 02-19-16 1405    Functional Assessment Tool Used tug, 10 MW, 6 mw, 5 x sit to stand   Functional Limitation Mobility: Walking and moving around   Mobility: Walking and Moving Around  Current Status ([id number]) At least 40 percent but less than 60 percent impaired, limited or restricted   Mobility: Walking and Moving Around Goal Status [phone]) At least 20 percent but less than 40 percent impaired, limited or restricted       Problem List There are no active problems to display for this patient. Alanson Puls, PT, DPT  [place], Minette [place] S 02/19/2016, 2:47 PM  [place] MAIN [facility] SERVICES [address] [place], [place], [address] Phone: [phone]   Fax:  [phone]  Name: [name] MRN: [id number] Date of Birth: 1945-11-24

## 2016-02-17 ENCOUNTER — Ambulatory Visit: Payer: Commercial Managed Care - HMO

## 2016-02-17 DIAGNOSIS — Z23 Encounter for immunization: Secondary | ICD-10-CM | POA: Diagnosis not present

## 2016-02-17 DIAGNOSIS — M5440 Lumbago with sciatica, unspecified side: Secondary | ICD-10-CM

## 2016-02-17 DIAGNOSIS — M6281 Muscle weakness (generalized): Secondary | ICD-10-CM | POA: Diagnosis not present

## 2016-02-17 NOTE — Therapy (Signed)
[place] MAIN [facility] SERVICES [address] [place], [place], [address] Phone: [phone]   Fax:  [phone]  Physical Therapy Treatment  Patient Details  Name: [name] MRN: [id number] Date of Birth: Jun 28, 1945 Referring Provider: Newman Pies  Encounter Date: 02/17/2016      PT End of Session - 02/17/16 1435    Visit Number 2   Number of Visits 9   Date for PT Re-Evaluation 04/08/2016   Authorization Type g codes   PT Start Time 1245   PT Stop Time 1500   PT Time Calculation (min) 44 min   Equipment Utilized During Treatment Gait belt   Activity Tolerance Patient limited by pain;Patient limited by fatigue   Behavior During Therapy [facility] for tasks assessed/performed      Past Medical History:  Diagnosis Date  . Adenomatous colon polyp   . Arthritis   . Chronic kidney disease   . Diabetes mellitus without complication ([place])   . Dysuria   . Hypertension   . Osteopenia   . Sleep apnea   . Stroke ([place])   . Urine, incontinence, stress female     Past Surgical History:  Procedure Laterality Date  . COLONOSCOPY W/ POLYPECTOMY Right   . COLONOSCOPY WITH PROPOFOL N/A 01/09/2015   Procedure: COLONOSCOPY WITH PROPOFOL;  Surgeon: Manya Silvas, MD;  Location: [facility] ENDOSCOPY;  Service: Endoscopy;  Laterality: N/A;  . JOINT REPLACEMENT    . LITHOTRIPSY    . ltk Left   . throidectomy      There were no vitals filed for this visit.      Subjective Assessment - 02/17/16 1420    Subjective Patient states she's been having increased back pain since the previous Sunday and attributes the pain to the weather. Patient states increased pain with lying down and is able to sleep throughout the night. Increased pain with standing today.    Pertinent History CVA, diabetes, high blood pressure, kidney surgery, thyroid surgery, left knee replacement,   Limitations Walking;Standing   How long can you sit comfortably? a long time    How long can you stand comfortably? not very long   How long can you walk comfortably? not very far using a walker   Patient Stated Goals to have less pain and to walk more   Currently in Pain? Yes   Pain Score 5    Pain Location Back   Pain Orientation Lower;Right;Left   Pain Descriptors / Indicators Shooting;Aching   Pain Type Chronic pain      TREATMENT: Therapeutic Exercise Standing lumbar extension with CGA - 2 x 5 to decrease pain - minimal to no difference in Lumbar pain Seated clamshells - 2 x 15 with RTB for glute med activation Seated ball squeeze/glute squeeze - 2 x 15 NuStep seat @ 5, with level 2 for 1.5 minutes and level 0 for 3.5 minutes Seated marches with cueing to activate core musculature - 2 x 15 Leg Press - 60# x 10 with proper knee positioning and muscle activation       PT Education - 02/17/16 1715    Education provided Yes   Education Details Added exercises to HEP   Person(s) Educated Patient   Methods Explanation;Demonstration   Comprehension Verbalized understanding;Returned demonstration             PT Long Term Goals - 02/11/16 1435      PT LONG TERM GOAL #1   Title Patient will be independent  in home exercise program to improve strength/mobility for better functional independence with ADLs.   Baseline greater than 30 sec   Time 8   Period Weeks   Status New     PT LONG TERM GOAL #2   Title Patient (> 75 years old) will complete five times sit to stand test in < 15 seconds indicating an increased LE strength and improved balance.   Baseline greater than 30 sec   Time 8   Period Weeks   Status New     PT LONG TERM GOAL #3   Title Patient will increase six minute walk test distance to >1000 for progression to community ambulator and improve gait ability   Baseline 300 feet   Time 8   Period Weeks   Status New     PT LONG TERM GOAL #4   Title Patient will report a worst pain of 3/10 on VAS in low back   to improve tolerance  with ADLs and reduced symptoms with activities.    Baseline 7/10   Time 4   Period Weeks     PT LONG TERM GOAL #5   Title  Patient will be able to perform household work/ chores without increase in symptoms.   Time 8   Period Weeks   Status New               Plan - 02/17/16 1716    Clinical Impression Statement Patient reports decreased ability perform activities in supine or prone without drastic increase in pain therefore performed exercises in sitting and standing to avoid this position. Focused on hip and LE strengthening exercises as patient demonstrates significant weakness and patient will benefit from further skilled therapy to return to prior level of function.    Rehab Potential Fair   Clinical Impairments Affecting Rehab Potential DM, decreased balance   PT Frequency 2x / week   PT Duration 4 weeks   PT Treatment/Interventions Cryotherapy;Aquatic Therapy;Electrical Stimulation;Moist Heat;Traction;Ultrasound;Therapeutic activities;Therapeutic exercise;Balance training;Manual techniques;Gait training   PT Next Visit Plan Therapeutic exercise and manual therapy   Consulted and Agree with Plan of Care Patient      Patient will benefit from skilled therapeutic intervention in order to improve the following deficits and impairments:  Decreased balance, Decreased mobility, Difficulty walking, Decreased range of motion, Decreased strength, Impaired flexibility, Pain, Obesity, Impaired sensation  Visit Diagnosis: Midline low back pain with sciatica, sciatica laterality unspecified, unspecified chronicity  Muscle weakness (generalized)     Problem List There are no active problems to display for this patient.   Blythe Stanford, PT DPT 02/17/2016, 5:24 PM  [place] MAIN [facility] SERVICES [address] [place], [place], [address] Phone: [phone]   Fax:  [phone]  Name: [name] MRN: [id number] Date of Birth:  25-Jan-1946

## 2016-02-20 ENCOUNTER — Ambulatory Visit: Payer: Commercial Managed Care - HMO | Admitting: Speech Pathology

## 2016-02-23 ENCOUNTER — Encounter: Payer: Self-pay | Admitting: Physical Therapy

## 2016-02-23 ENCOUNTER — Ambulatory Visit: Payer: Commercial Managed Care - HMO | Admitting: Physical Therapy

## 2016-02-23 DIAGNOSIS — M5440 Lumbago with sciatica, unspecified side: Secondary | ICD-10-CM

## 2016-02-23 DIAGNOSIS — M6281 Muscle weakness (generalized): Secondary | ICD-10-CM | POA: Diagnosis not present

## 2016-02-24 NOTE — Therapy (Signed)
[place] MAIN [facility] SERVICES [address] [place], [place], [address] Phone: [phone]   Fax:  [phone]  Physical Therapy Treatment  Patient Details  Name: [name] MRN: [id number] Date of Birth: 1946/04/08 Referring Provider: Newman Pies  Encounter Date: 02/23/2016      PT End of Session - 02/24/16 0759    Visit Number 3   Number of Visits 9   Date for PT Re-Evaluation Mar 14, 2016   Authorization Type g codes   PT Start Time 7902   PT Stop Time 4097   PT Time Calculation (min) 45 min   Equipment Utilized During Treatment Gait belt   Activity Tolerance Patient limited by pain;Patient limited by fatigue   Behavior During Therapy [facility] for tasks assessed/performed      Past Medical History:  Diagnosis Date  . Adenomatous colon polyp   . Arthritis   . Chronic kidney disease   . Diabetes mellitus without complication ([place])   . Dysuria   . Hypertension   . Osteopenia   . Sleep apnea   . Stroke ([place])   . Urine, incontinence, stress female     Past Surgical History:  Procedure Laterality Date  . COLONOSCOPY W/ POLYPECTOMY Right   . COLONOSCOPY WITH PROPOFOL N/A 01/09/2015   Procedure: COLONOSCOPY WITH PROPOFOL;  Surgeon: Manya Silvas, MD;  Location: [facility] ENDOSCOPY;  Service: Endoscopy;  Laterality: N/A;  . JOINT REPLACEMENT    . LITHOTRIPSY    . ltk Left   . throidectomy      There were no vitals filed for this visit.      Subjective Assessment - 02/23/16 1728    Subjective Pt states this morning she was 10/10 low back pain and now that she has been up and moving she has been about 4/10.  Pt reports that she did do her exercises this week.     Pertinent History CVA, diabetes, high blood pressure, kidney surgery, thyroid surgery, left knee replacement,   Limitations Walking;Standing   How long can you sit comfortably? a long time   How long can you stand comfortably? not very long   How long can you  walk comfortably? not very far using a walker   Patient Stated Goals to have less pain and to walk more   Currently in Pain? Yes   Pain Score 4    Pain Location Back   Pain Orientation Right;Left;Lower  mostly R side     Treatment Nustep x 4 mins BLEs level 2 (unbilled)  Lower trunk rotations, 2 sets x 10 reps, min VCs to perform slowly and with greater control Manual long axis distraction BLEs, 3 bouts x 30 seconds each leg, pt reported some pain relief with manual distraction  Hip adduction ball squeezes in hook lying with head elevated, 2 sets x 10 reps, min VCs to squeeze at knee and then squeeze gluts Hooklying clamshells with red theraband resistance, 2 sets x 10 reps, min VCs for increased eccentric control  Supine TrA marches with HOB reclined, 2 sets x 20 reps, min VCs to keep core tight throughout  Scapular retractions seated with red theraband resistance, 1 sets x 10 reps, min VCs for upright posture and increase scapular retraction, min tactile cues for increased scapular retraction   Leg Press, BLEs, 2 sets x 10 reps, #60, min VCs for decreased terminal knee extension and increased eccentric control  PT Education - 02/23/16 1729    Education provided Yes   Education Details reinforced HEP    Person(s) Educated Patient   Methods Explanation;Demonstration;Verbal cues   Comprehension Verbalized understanding;Returned demonstration;Verbal cues required             PT Long Term Goals - 02/11/16 1435      PT LONG TERM GOAL #1   Title Patient will be independent in home exercise program to improve strength/mobility for better functional independence with ADLs.   Baseline greater than 30 sec   Time 8   Period Weeks   Status New     PT LONG TERM GOAL #2   Title Patient (> 71 years old) will complete five times sit to stand test in < 15 seconds indicating an increased LE strength and improved balance.   Baseline greater  than 30 sec   Time 8   Period Weeks   Status New     PT LONG TERM GOAL #3   Title Patient will increase six minute walk test distance to >1000 for progression to community ambulator and improve gait ability   Baseline 300 feet   Time 8   Period Weeks   Status New     PT LONG TERM GOAL #4   Title Patient will report a worst pain of 3/10 on VAS in low back   to improve tolerance with ADLs and reduced symptoms with activities.    Baseline 7/10   Time 4   Period Weeks     PT LONG TERM GOAL #5   Title  Patient will be able to perform household work/ chores without increase in symptoms.   Time 8   Period Weeks   Status New               Plan - 02/24/16 0800    Clinical Impression Statement Pt continues to report more pain standing rather than sitting.  She was able to tolerate a reclined position on mat table without increased pain.  Continued hooklying core and hip strengthening exercises with red theraband resistance, pt was able to perform without increases in pain.  Pt benefited from long axis distraction in hooklying to BLEs with support under both knees.  Initiated scapular retractions for better seated and standing posture.  Pt reports her pain was down after treament.  She would benefit from further skilled PT to decrease her pain, increase tolerance to supine and general LE and core strengthening for greater functional mobility.     Rehab Potential Fair   Clinical Impairments Affecting Rehab Potential DM, decreased balance   PT Frequency 2x / week   PT Duration 4 weeks   PT Treatment/Interventions Cryotherapy;Aquatic Therapy;Electrical Stimulation;Moist Heat;Traction;Ultrasound;Therapeutic activities;Therapeutic exercise;Balance training;Manual techniques;Gait training   PT Next Visit Plan Therapeutic exercise and manual therapy   Consulted and Agree with Plan of Care Patient      Patient will benefit from skilled therapeutic intervention in order to improve the  following deficits and impairments:  Decreased balance, Decreased mobility, Difficulty walking, Decreased range of motion, Decreased strength, Impaired flexibility, Pain, Obesity, Impaired sensation  Visit Diagnosis: Midline low back pain with sciatica, sciatica laterality unspecified, unspecified chronicity  Muscle weakness (generalized)     Problem List There are no active problems to display for this patient.  [name], SPT   This entire session was performed under direct supervision and direction of a licensed therapist/therapist assistant . I have personally read, edited and approve of the note as  written.  Collie Siad PT, DPT 02/24/2016, 11:08 AM  [place] MAIN [facility] SERVICES [address] [place], [place], [address] Phone: [phone]   Fax:  [phone]  Name: Marie Borowski MRN: [id number] Date of Birth: Jan 15, 1946

## 2016-02-25 ENCOUNTER — Encounter: Payer: Self-pay | Admitting: Physical Therapy

## 2016-02-25 ENCOUNTER — Ambulatory Visit: Payer: Commercial Managed Care - HMO | Admitting: Physical Therapy

## 2016-02-25 DIAGNOSIS — M5440 Lumbago with sciatica, unspecified side: Secondary | ICD-10-CM

## 2016-02-25 DIAGNOSIS — M6281 Muscle weakness (generalized): Secondary | ICD-10-CM

## 2016-02-25 NOTE — Therapy (Signed)
[place] MAIN [facility] SERVICES [address] [place], [place], [address] Phone: [phone]   Fax:  [phone]  Physical Therapy Treatment  Patient Details  Name: [name] MRN: [id number] Date of Birth: June 26, 1945 Referring Provider: Newman Pies  Encounter Date: 02/25/2016      PT End of Session - 02/25/16 1707    Visit Number 4   Number of Visits 9   Date for PT Re-Evaluation 03/20/16   Authorization Type g codes   PT Start Time 6962   PT Stop Time 1730   PT Time Calculation (min) 39 min   Equipment Utilized During Treatment Gait belt   Activity Tolerance Patient limited by pain;Patient limited by fatigue   Behavior During Therapy [facility] for tasks assessed/performed      Past Medical History:  Diagnosis Date  . Adenomatous colon polyp   . Arthritis   . Chronic kidney disease   . Diabetes mellitus without complication ([place])   . Dysuria   . Hypertension   . Osteopenia   . Sleep apnea   . Stroke ([place])   . Urine, incontinence, stress female     Past Surgical History:  Procedure Laterality Date  . COLONOSCOPY W/ POLYPECTOMY Right   . COLONOSCOPY WITH PROPOFOL N/A 01/09/2015   Procedure: COLONOSCOPY WITH PROPOFOL;  Surgeon: Manya Silvas, MD;  Location: [facility] ENDOSCOPY;  Service: Endoscopy;  Laterality: N/A;  . JOINT REPLACEMENT    . LITHOTRIPSY    . ltk Left   . throidectomy      There were no vitals filed for this visit.      Subjective Assessment - 02/25/16 1656    Subjective Pt reports that her LBP is 5/10 today and was unable to do her stretches/exercises yesterday due to being busy.   Pertinent History CVA, diabetes, high blood pressure, kidney surgery, thyroid surgery, left knee replacement,   Limitations Walking;Standing   How long can you sit comfortably? a long time   How long can you stand comfortably? not very long   How long can you walk comfortably? not very far using a walker   Patient  Stated Goals to have less pain and to walk more   Currently in Pain? Yes   Pain Score 5    Pain Location Back   Pain Orientation Left;Right;Lower   Pain Descriptors / Indicators Aching     Treatment Seated hamstring stretch, 2 sets x 30 seconds each leg, min VCs to not go into pain Manual long axis distraction to BLEs, 3 bouts x 30 seconds each LE, pt reported low back pain relief after manual TrA marches with #2 weight, 2 sets x 10 reps each leg, min VCs to keep core tightened and perform with eccentric control  Sidelying clamshells with HOB elevated, min VCs for initial positioning, 2 sets x 10 reps,  Adduction ball squeezes in hook lying, 2 sets x 10 reps with 3 second eccentric holds, min VCs for breathing technique and positioning  Scapular retractions seated with red theraband resistance, 2 sets x 10 reps, min VCs for increased scapular retraction and proper breathing technique  Leg Press, BLEs, #60, 2 sets x 10 reps, min VCs for greater eccentric control                             PT Education - 02/25/16 1704    Education provided Yes   Education Details addition of hamstring  stretch    Person(s) Educated Patient   Methods Explanation;Demonstration;Verbal cues   Comprehension Verbalized understanding;Returned demonstration;Verbal cues required             PT Long Term Goals - 02/11/16 1435      PT LONG TERM GOAL #1   Title Patient will be independent in home exercise program to improve strength/mobility for better functional independence with ADLs.   Baseline greater than 30 sec   Time 8   Period Weeks   Status New     PT LONG TERM GOAL #2   Title Patient (> 95 years old) will complete five times sit to stand test in < 15 seconds indicating an increased LE strength and improved balance.   Baseline greater than 30 sec   Time 8   Period Weeks   Status New     PT LONG TERM GOAL #3   Title Patient will increase six minute walk test distance  to >1000 for progression to community ambulator and improve gait ability   Baseline 300 feet   Time 8   Period Weeks   Status New     PT LONG TERM GOAL #4   Title Patient will report a worst pain of 3/10 on VAS in low back   to improve tolerance with ADLs and reduced symptoms with activities.    Baseline 7/10   Time 4   Period Weeks     PT LONG TERM GOAL #5   Title  Patient will be able to perform household work/ chores without increase in symptoms.   Time 8   Period Weeks   Status New               Plan - 02/25/16 1710    Clinical Impression Statement Pt reports that she had alot of pain yesterday but did not do her exercises or stretches.  Pt reports 5/10 LBP today but was able to tolerate head of bed elevation less than previous session.  Advanced core strengthening with weighted TrA marches and advanced hip strengthening with sidelying clamshells.  Pt was able to transition into and out of sidelying without an increase in LBP which is improvement since previous session. Pt demonstrated better form with scapular retractions since previous session with less verbal cues.  She would continue to benefit from further skilled PT to work towards increaesd lumbar ROM, LE strength and greater tolerance to supine position for more functional mobility.      Rehab Potential Fair   Clinical Impairments Affecting Rehab Potential DM, decreased balance   PT Frequency 2x / week   PT Duration 4 weeks   PT Treatment/Interventions Cryotherapy;Aquatic Therapy;Electrical Stimulation;Moist Heat;Traction;Ultrasound;Therapeutic activities;Therapeutic exercise;Balance training;Manual techniques;Gait training   PT Next Visit Plan Therapeutic exercise and manual therapy   Consulted and Agree with Plan of Care Patient      Patient will benefit from skilled therapeutic intervention in order to improve the following deficits and impairments:  Decreased balance, Decreased mobility, Difficulty walking,  Decreased range of motion, Decreased strength, Impaired flexibility, Pain, Obesity, Impaired sensation  Visit Diagnosis: Muscle weakness (generalized)  Midline low back pain with sciatica, sciatica laterality unspecified, unspecified chronicity     Problem List There are no active problems to display for this patient.  [name], SPT   This entire session was performed under direct supervision and direction of a licensed therapist/therapist assistant . I have personally read, edited and approve of the note as written.  Collie Siad PT, DPT 02/25/2016,  5:44 PM  [place] MAIN [facility] SERVICES [address] [place], [place], [address] Phone: [phone]   Fax:  [phone]  Name: [name] MRN: [id number] Date of Birth: [birth year]

## 2016-02-27 ENCOUNTER — Ambulatory Visit: Payer: Commercial Managed Care - HMO | Admitting: Speech Pathology

## 2016-03-01 ENCOUNTER — Ambulatory Visit: Payer: Commercial Managed Care - HMO | Admitting: Physical Therapy

## 2016-03-01 ENCOUNTER — Encounter: Payer: Self-pay | Admitting: Physical Therapy

## 2016-03-01 DIAGNOSIS — M5440 Lumbago with sciatica, unspecified side: Secondary | ICD-10-CM

## 2016-03-01 DIAGNOSIS — M6281 Muscle weakness (generalized): Secondary | ICD-10-CM | POA: Diagnosis not present

## 2016-03-01 NOTE — Patient Instructions (Addendum)
   Nustep x 4 mins BLEs level 1 (unbilled) Manual long axis distraction to BLEs, 3 bouts x 20 seconds, for pain relief,  Lower trunk rotations, 2 sets x 10 reps, min VCs to increase rotation  Single knee to chest, 2 sets x 10 reps, min VCs for further ROM  TrA single leg extension in hooklying, 2 sets x 10 reps, min VCS to keep core tight throughout  Clamshells, 2 sets x 10 reps, min VCs for proper positioning to start, BLEs 4 way hip forwards/backwards/sidways x 10 reps BLEs, min VCS for upright posture and increased glut activation with extension   Leg Press Scapular retractions    Balance, Proprioception: Hip Flexion With Tubing    Stand holding counter, kick forward 5-10 times, keep back and knees straight.    http://cc.exer.us/18   Copyright  VHI. All rights reserved.  Balance, Proprioception: Hip Extension With Tubing    Stand holding counter, keep back straight, and kick backwards squeezing bottom for 5-10 reps  http://cc.exer.us/19   Copyright  VHI. All rights reserved.  Balance, Proprioception: Hip Abduction With Tubing    Stand at counter, kick leg sideways, 5-10 reps each leg, keep leg straight and perform slowly   http://cc.exer.[email or web address]   Copyright  VHI. All rights reserved.

## 2016-03-01 NOTE — Therapy (Signed)
[place] MAIN [facility] SERVICES [address] [place], [place], [address] Phone: [phone]   Fax:  [phone]  Physical Therapy Treatment  Patient Details  Name: [name] MRN: [id number] Date of Birth: 1945-07-25 Referring Provider: Newman Pies  Encounter Date: 03/01/2016      PT End of Session - 03/01/16 1446    Visit Number 5   Number of Visits 9   Date for PT Re-Evaluation 03-24-16   Authorization Type g codes   PT Start Time 1400   PT Stop Time 1445   PT Time Calculation (min) 45 min   Equipment Utilized During Treatment Gait belt   Activity Tolerance Patient limited by pain;Patient limited by fatigue   Behavior During Therapy [facility] for tasks assessed/performed      Past Medical History:  Diagnosis Date  . Adenomatous colon polyp   . Arthritis   . Chronic kidney disease   . Diabetes mellitus without complication ([name])   . Dysuria   . Hypertension   . Osteopenia   . Sleep apnea   . Stroke ([name])   . Urine, incontinence, stress female     Past Surgical History:  Procedure Laterality Date  . COLONOSCOPY W/ POLYPECTOMY Right   . COLONOSCOPY WITH PROPOFOL N/A 01/09/2015   Procedure: COLONOSCOPY WITH PROPOFOL;  Surgeon: Manya Silvas, MD;  Location: [facility] ENDOSCOPY;  Service: Endoscopy;  Laterality: N/A;  . JOINT REPLACEMENT    . LITHOTRIPSY    . ltk Left   . throidectomy      There were no vitals filed for this visit.      Subjective Assessment - 03/01/16 1353    Subjective Pt reports that pain is 6/10 today and that she didnt have to take any pain medication.  Pt reports that pain subsides after doing some of his exercises.     Pertinent History CVA, diabetes, high blood pressure, kidney surgery, thyroid surgery, left knee replacement,   Limitations Walking;Standing   How long can you sit comfortably? a long time   How long can you stand comfortably? not very long   How long can you walk  comfortably? not very far using a walker   Patient Stated Goals to have less pain and to walk more   Currently in Pain? Yes   Pain Score 6    Pain Location Back   Pain Orientation Left;Right;Lower   Pain Descriptors / Indicators Aching     Treatment Nustep x 4 mins BLEs level 1 (unbilled) Manual long axis distraction to BLEs, 3 bouts x 20 seconds, for pain relief in hooklying, pt reported decrease low back discomfort following  (Manual 8 mins total)  Lower trunk rotations, 2 sets x 10 reps, min VCs to increase rotation to either side Single knee to chest, 2 sets x 10 reps, min VCs for further ROM  TrA single leg extension in hooklying, 2 sets x 10 reps, min VCS to keep core tight throughout  Clamshells, 2 sets x 10 reps BLEs, min VCs for proper positioning to start, 4 way hip forwards/backwards/sidways x 10 reps BLEs, min VCS for upright posture and increased glut activation with extension                                PT Education - 03/01/16 1420    Education provided Yes   Education Details reinforced HEP    Person(s) Educated Patient  Methods Explanation;Demonstration;Verbal cues   Comprehension Verbalized understanding;Returned demonstration;Verbal cues required             PT Long Term Goals - 02/11/16 1435      PT LONG TERM GOAL #1   Title Patient will be independent in home exercise program to improve strength/mobility for better functional independence with ADLs.   Baseline greater than 30 sec   Time 8   Period Weeks   Status New     PT LONG TERM GOAL #2   Title Patient (70 years old) will complete five times sit to stand test in < 15 seconds indicating an increased LE strength and improved balance.   Baseline greater than 30 sec   Time 8   Period Weeks   Status New     PT LONG TERM GOAL #3   Title Patient will increase six minute walk test distance to >1000 for progression to community ambulator and improve gait ability    Baseline 300 feet   Time 8   Period Weeks   Status New     PT LONG TERM GOAL #4   Title Patient (70 years old) will report a worst pain of 3/10 on VAS in low back   to improve tolerance with ADLs and reduced symptoms with activities.    Baseline 7/10   Time 4   Period Weeks     PT LONG TERM GOAL #5   Title  Patient will be able to perform household work/ chores without increase in symptoms.   Time 8   Period Weeks   Status New               Plan - 03/01/16 1451    Clinical Impression Statement Pt reports that she believes she is improving and did not have to take any pain medication today.  Core strengthening was advanced today with adequate form and proper core activation.  4 way hip was initated into HEP to increase standing tolerance and hip strength.  She reports decrease in low back pain following treatment.  Pt would benefit from further skilled PT to decrease low back pain, increase LE strength and standing tolerance for more functional mobility.     Rehab Potential Fair   Clinical Impairments Affecting Rehab Potential DM, decreased balance   PT Frequency 2x / week   PT Duration 4 weeks   PT Treatment/Interventions Cryotherapy;Aquatic Therapy;Electrical Stimulation;Moist Heat;Traction;Ultrasound;Therapeutic activities;Therapeutic exercise;Balance training;Manual techniques;Gait training   PT Next Visit Plan Therapeutic exercise and manual therapy   Consulted and Agree with Plan of Care Patient      Patient will benefit from skilled therapeutic intervention in order to improve the following deficits and impairments:  Decreased balance, Decreased mobility, Difficulty walking, Decreased range of motion, Decreased strength, Impaired flexibility, Pain, Obesity, Impaired sensation  Visit Diagnosis: Muscle weakness (generalized)  Midline low back pain with sciatica, sciatica laterality unspecified, unspecified chronicity     Problem List There are no active problems to display for  this patient.  [name], SPT  This entire session was performed under direct supervision and direction of a licensed therapist/therapist assistant . I have personally read, edited and approve of the note as written.  [name],[name] PT, DPT 03/02/2016, 10:18 AM  [place] MAIN [facility] SERVICES [address] [place], [place], [address] Phone: [phone]   Fax:  [phone]  Name: Oksana Deberry MRN: [id number] Date of Birth: [birth year]

## 2016-03-02 ENCOUNTER — Ambulatory Visit: Payer: Commercial Managed Care - HMO | Admitting: Speech Pathology

## 2016-03-03 ENCOUNTER — Ambulatory Visit: Payer: Commercial Managed Care - HMO | Admitting: Physical Therapy

## 2016-03-03 ENCOUNTER — Encounter: Payer: Self-pay | Admitting: Physical Therapy

## 2016-03-03 VITALS — BP 154/96 | HR 71

## 2016-03-03 DIAGNOSIS — M5440 Lumbago with sciatica, unspecified side: Secondary | ICD-10-CM | POA: Diagnosis not present

## 2016-03-03 DIAGNOSIS — M6281 Muscle weakness (generalized): Secondary | ICD-10-CM | POA: Diagnosis not present

## 2016-03-03 NOTE — Therapy (Signed)
[place] MAIN [facility] SERVICES [address] [place], [place], [address] Phone: [phone]   Fax:  [phone]  Physical Therapy Treatment  Patient Details  Name: [name] MRN: [id number] Date of Birth: November 26, 1945 Referring Provider: Newman Pies  Encounter Date: 03/03/2016      PT End of Session - 03/03/16 1415    Visit Number 6   Number of Visits 9   Date for PT Re-Evaluation 2016-03-15   Authorization Type g codes   PT Start Time 2951   PT Stop Time 1435   PT Time Calculation (min) 48 min   Equipment Utilized During Treatment Gait belt   Activity Tolerance Patient limited by pain;Patient limited by fatigue   Behavior During Therapy [facility] for tasks assessed/performed      Past Medical History:  Diagnosis Date  . Adenomatous colon polyp   . Arthritis   . Chronic kidney disease   . Diabetes mellitus without complication ([place])   . Dysuria   . Hypertension   . Osteopenia   . Sleep apnea   . Stroke ([place])   . Urine, incontinence, stress female     Past Surgical History:  Procedure Laterality Date  . COLONOSCOPY W/ POLYPECTOMY Right   . COLONOSCOPY WITH PROPOFOL N/A 01/09/2015   Procedure: COLONOSCOPY WITH PROPOFOL;  Surgeon: Manya Silvas, MD;  Location: [facility] ENDOSCOPY;  Service: Endoscopy;  Laterality: N/A;  . JOINT REPLACEMENT    . LITHOTRIPSY    . ltk Left   . throidectomy      Vitals:   03/03/16 1352  BP: (!) 154/96  Pulse: 71  SpO2: 98%        Subjective Assessment - 03/03/16 1352    Subjective Pt reports her LBP is 7/10 today.  Has been completing her exercises almost daily.  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    Pertinent History CVA, diabetes, high blood pressure, kidney surgery, thyroid surgery, left knee replacement,   Limitations Walking;Standing   How long can you sit comfortably? a long time   How long can you stand  comfortably? not very long   How long can you walk comfortably? not very far using a walker   Patient Stated Goals to have less pain and to walk more   Currently in Pain? Yes   Pain Score 7    Pain Location Back   Pain Orientation Lower   Pain Descriptors / Indicators Aching   Pain Type Chronic pain        TREATMENT   Manual Therapy:  Manual long axis distraction to BLEs, 3 bouts x 20 seconds, for pain relief in hooklying, pt reported decrease low back discomfort following ?(Manual 8 mins total)   Therapeutic Exercise:  Nustep x 3 mins BLEs level 1 (unbilled)  Hooklying hip Abd/ER 2x10 with RTB around knees. Cues for core and glute activation.  Lower trunk rotations, 2 sets x 10 reps, cues to control rotation and to increase rotation as she goes through her reps  Single knee to chest, 2 sets x 10 reps, cues for core activation  TA marches in hooklying, 1x10 with cues for core activation throughout  Bil leg press 3x10, 60# with cues for eccentric control and glute activation                  PT Education - 03/03/16 1354    Education provided Yes  Education Details Exercise Technique   Person(s) Educated Patient   Methods Explanation;Demonstration;Verbal cues;Tactile cues   Comprehension Verbalized understanding;Returned demonstration             PT Long Term Goals - 02/11/16 1435      PT LONG TERM GOAL #1   Title Patient will be independent in home exercise program to improve strength/mobility for better functional independence with ADLs.   Baseline greater than 30 sec   Time 8   Period Weeks   Status New     PT LONG TERM GOAL #2   Title Patient (> 75 years old) will complete five times sit to stand test in < 15 seconds indicating an increased LE strength and improved balance.   Baseline greater than 30 sec   Time 8   Period Weeks   Status New     PT LONG TERM GOAL #3   Title Patient will increase six minute walk test distance to >1000 for  progression to community ambulator and improve gait ability   Baseline 300 feet   Time 8   Period Weeks   Status New     PT LONG TERM GOAL #4   Title Patient will report a worst pain of 3/10 on VAS in low back   to improve tolerance with ADLs and reduced symptoms with activities.    Baseline 7/10   Time 4   Period Weeks     PT LONG TERM GOAL #5   Title  Patient will be able to perform household work/ chores without increase in symptoms.   Time 8   Period Weeks   Status New               Plan - 03/03/16 1421    Clinical Impression Statement [name] tolerated all interventions well this date.  Her pain decreased from a 7/10 at the start of the session to a 2/10 at the end of the session.  She requires cues for technique and for core and glute activation throughout exercises.  She will benefit from continued skilled PT to decrease pain and improve strength and stabilization.   Rehab Potential Fair   Clinical Impairments Affecting Rehab Potential DM, decreased balance   PT Frequency 2x / week   PT Duration 4 weeks   PT Treatment/Interventions Cryotherapy;Aquatic Therapy;Electrical Stimulation;Moist Heat;Traction;Ultrasound;Therapeutic activities;Therapeutic exercise;Balance training;Manual techniques;Gait training   PT Next Visit Plan Therapeutic exercise and manual therapy   Consulted and Agree with Plan of Care Patient      Patient will benefit from skilled therapeutic intervention in order to improve the following deficits and impairments:  Decreased balance, Decreased mobility, Difficulty walking, Decreased range of motion, Decreased strength, Impaired flexibility, Pain, Obesity, Impaired sensation  Visit Diagnosis: Muscle weakness (generalized)  Midline low back pain with sciatica, sciatica laterality unspecified, unspecified chronicity     Problem List There are no active problems to display for this patient.   Collie Siad PT, DPT 03/03/2016, 3:49  PM  [place] MAIN [facility] SERVICES [address] [place], [place], [address] Phone: [phone]   Fax:  [phone]  Name: [name] MRN: [id number] Date of Birth: 1945/06/24

## 2016-03-04 ENCOUNTER — Ambulatory Visit: Payer: Commercial Managed Care - HMO | Admitting: Physical Therapy

## 2016-03-08 ENCOUNTER — Encounter: Payer: Self-pay | Admitting: Physical Therapy

## 2016-03-08 ENCOUNTER — Ambulatory Visit: Payer: Commercial Managed Care - HMO | Admitting: Physical Therapy

## 2016-03-08 ENCOUNTER — Ambulatory Visit: Payer: Commercial Managed Care - HMO | Admitting: Speech Pathology

## 2016-03-08 DIAGNOSIS — M5440 Lumbago with sciatica, unspecified side: Secondary | ICD-10-CM

## 2016-03-08 DIAGNOSIS — M6281 Muscle weakness (generalized): Secondary | ICD-10-CM

## 2016-03-08 NOTE — Therapy (Signed)
[place] MAIN [facility] SERVICES [address] [place], [place], [address] Phone: [phone]   Fax:  [phone]  Physical Therapy Treatment/Progress Note   Patient Details  Name: [name] MRN: [id number] Date of Birth: 1945/09/23 Referring Provider: Newman Pies  Encounter Date: 03/08/2016      PT End of Session - 03/08/16 1123    Visit Number 7   Number of Visits 17   Date for PT Re-Evaluation May 05, 2016   Authorization Type g codes   Authorization Time Period 7/10   PT Start Time 9833   PT Stop Time 1114   PT Time Calculation (min) 45 min   Equipment Utilized During Treatment Gait belt   Activity Tolerance Patient limited by pain;Patient limited by fatigue   Behavior During Therapy [facility] for tasks assessed/performed      Past Medical History:  Diagnosis Date  . Adenomatous colon polyp   . Arthritis   . Chronic kidney disease   . Diabetes mellitus without complication ([place])   . Dysuria   . Hypertension   . Osteopenia   . Sleep apnea   . Stroke ([place])   . Urine, incontinence, stress female     Past Surgical History:  Procedure Laterality Date  . COLONOSCOPY W/ POLYPECTOMY Right   . COLONOSCOPY WITH PROPOFOL N/A 01/09/2015   Procedure: COLONOSCOPY WITH PROPOFOL;  Surgeon: Manya Silvas, MD;  Location: [facility] ENDOSCOPY;  Service: Endoscopy;  Laterality: N/A;  . JOINT REPLACEMENT    . LITHOTRIPSY    . ltk Left   . throidectomy      There were no vitals filed for this visit.      Subjective Assessment - 03/08/16 1027    Subjective Pt reports she had a good weekend with minimal pain.  She reports she has been doing her exercises.     Pertinent History CVA, diabetes, high blood pressure, kidney surgery, thyroid surgery, left knee replacement,   Limitations Walking;Standing   How long can you sit comfortably? a long time   How long can you stand comfortably? not very long   How long can you walk comfortably?  not very far using a walker   Patient Stated Goals to have less pain and to walk more   Currently in Pain? Yes   Pain Score 4    Pain Orientation Lower   Pain Descriptors / Indicators Aching   Pain Type Chronic pain            [facility] PT Assessment - 03/08/16 0001      6 minute walk test results    Endurance additional comments 329f with one seated rest break     Standardized Balance Assessment   Five times sit to stand comments  14.35 with use of BUEs       Treatment Nustep x 4 mins BLEs level 2 (unbilled)  Retested 5 time sit to stand for progress, pt demonstrated progress and was able to perform in 14.35 seconds with use of UEs which is progress since last attempt of greater than 30 seconds  Retested 6 min walk for progress, pt demonstrated progress by ambulating 3837fwith one seated rest break compared to last attempt of 30062fith SPC  Manual long axis distraction to BLEs, 3 bouts x 20 seconds each leg for pain relief (8 mins manual total)  Lumbar lower trunk rotations, 2 sets x 10 reps with knee together and min VCs to rotate as far as possible without pain  Glut bridges deferred due to increased LBP TrA straight leg extension, 1 set x 10 reps alternating LEs, min VCs to keep core tight throughout  Scapular retractions with yellow theraband resistance, 2 sets x 10 reps, min VCs for upright posture and increased scapular retraction  Leg Press, BLEs, 2 sets x 10 reps, #90, min VCs to increase eccentric control throughout                          PT Education - 03/08/16 1123    Education provided Yes   Education Details intermittent walking program    Person(s) Educated Patient   Methods Explanation;Demonstration;Verbal cues   Comprehension Verbalized understanding;Returned demonstration;Verbal cues required             PT Long Term Goals - 03/08/16 1129      PT LONG TERM GOAL #1   Title Patient will be independent in home exercise program to  improve strength/mobility for better functional independence with ADLs.   Time 8   Period Weeks   Status On-going     PT LONG TERM GOAL #2   Title Patient (> 71 years old) will complete five times sit to stand test in < 15 seconds indicating an increased LE strength and improved balance.   Baseline greater than 30 sec, 14.35 seconds with use of UEs on 03/08/16   Time 8   Period Weeks   Status Partially Met     PT LONG TERM GOAL #3   Title Patient will increase six minute walk test distance to >1000 for progression to community ambulator and improve gait ability   Baseline 300 feet, 372f on 03/08/16   Time 8   Period Weeks   Status Partially Met     PT LONG TERM GOAL #4   Title Patient will report a worst pain of 3/10 on VAS in low back   to improve tolerance with ADLs and reduced symptoms with activities.    Baseline 7/10, 4/10 on 03/08/16   Time 8   Period Weeks     PT LONG TERM GOAL #5   Title  Patient will be able to perform household work/ chores without increase in symptoms.   Time 8   Period Weeks   Status On-going               Plan - 03/08/16 1124    Clinical Impression Statement Pt reported that she had a relatively pain free weekend and did all of her exercises.  Pt reports little to no pain at a 4/10 today.  Pt demonstrated progress in her goals.  She was able to perform 5 time sit to stand in 14.35 seconds while using BUEs.  Pt was able to ambulate 3879fover 6 min walk with seated rest break in between. compared to last attempt of 30035fotal.  Encouraged intermittent walking program to work towards increasing endurance.  Continued to work towards increasing core activation and hip and glut strength for further lumbar support.  She would benefit from further skilled PT to decrease low back pain, incrase endurance and mobility for greater independence.     Rehab Potential Fair   Clinical Impairments Affecting Rehab Potential DM, decreased balance   PT  Frequency 2x / week   PT Duration 8 weeks   PT Treatment/Interventions Cryotherapy;Aquatic Therapy;Electrical Stimulation;Moist Heat;Traction;Ultrasound;Therapeutic activities;Therapeutic exercise;Balance training;Manual techniques;Gait training   PT Next Visit Plan Therapeutic exercise and manual therapy   PT Home  Exercise Plan added intermittent walking    Consulted and Agree with Plan of Care Patient      Patient will benefit from skilled therapeutic intervention in order to improve the following deficits and impairments:  Decreased balance, Decreased mobility, Difficulty walking, Decreased range of motion, Decreased strength, Impaired flexibility, Pain, Obesity, Impaired sensation, Increased edema, Decreased activity tolerance  Visit Diagnosis: Muscle weakness (generalized)  Midline low back pain with sciatica, sciatica laterality unspecified, unspecified chronicity     Problem List There are no active problems to display for this patient.  [name], SPT  This entire session was performed under direct supervision and direction of a licensed therapist/therapist assistant . I have personally read, edited and approve of the note as written.  [name],[name] PT, DPT 03/08/2016, 4:24 PM  [place] MAIN [facility] SERVICES [address] [place], [place], [address] Phone: [phone]   Fax:  [phone]  Name: [name] MRN: [id number] Date of Birth: [birth year]

## 2016-03-10 ENCOUNTER — Ambulatory Visit: Payer: Commercial Managed Care - HMO | Attending: Physical Medicine and Rehabilitation

## 2016-03-10 DIAGNOSIS — M6281 Muscle weakness (generalized): Secondary | ICD-10-CM | POA: Diagnosis not present

## 2016-03-10 DIAGNOSIS — M5440 Lumbago with sciatica, unspecified side: Secondary | ICD-10-CM

## 2016-03-10 NOTE — Therapy (Signed)
[place] MAIN [facility] SERVICES [address] [place], [place], [address] Phone: [phone]   Fax:  [phone]  Physical Therapy Treatment  Patient Details  Name: [name] MRN: [id number] Date of Birth: Aug 16, 1945 Referring Provider: Newman Pies  Encounter Date: 03/10/2016      PT End of Session - 03/10/16 1529    Visit Number 8   Number of Visits 17   Date for PT Re-Evaluation 04-26-2016   Authorization Type g codes   Authorization Time Period 8/10   PT Start Time 1300   PT Stop Time 1345   PT Time Calculation (min) 45 min   Equipment Utilized During Treatment Gait belt   Activity Tolerance Patient limited by pain;Patient limited by fatigue   Behavior During Therapy [facility] for tasks assessed/performed      Past Medical History:  Diagnosis Date  . Adenomatous colon polyp   . Arthritis   . Chronic kidney disease   . Diabetes mellitus without complication ([place])   . Dysuria   . Hypertension   . Osteopenia   . Sleep apnea   . Stroke ([place])   . Urine, incontinence, stress female     Past Surgical History:  Procedure Laterality Date  . COLONOSCOPY W/ POLYPECTOMY Right   . COLONOSCOPY WITH PROPOFOL N/A 01/09/2015   Procedure: COLONOSCOPY WITH PROPOFOL;  Surgeon: Manya Silvas, MD;  Location: [facility] ENDOSCOPY;  Service: Endoscopy;  Laterality: N/A;  . JOINT REPLACEMENT    . LITHOTRIPSY    . ltk Left   . throidectomy      There were no vitals filed for this visit.      Subjective Assessment - 03/10/16 1307    Subjective Pt reports increased pain in the low back along the sacrum. States she tried to move a case of water and felt a "pull" in her back. Reports increased symptoms since last night into this morning.    Pertinent History CVA, diabetes, high blood pressure, kidney surgery, thyroid surgery, left knee replacement,   Limitations Walking;Standing   How long can you sit comfortably? a long time   How long can  you stand comfortably? not very long   How long can you walk comfortably? not very far using a walker   Patient Stated Goals to have less pain and to walk more   Currently in Pain? Yes   Pain Score 9    Pain Location Back   Pain Orientation Lower   Pain Descriptors / Indicators Aching   Pain Type Chronic pain        TREATMENT Manual Therapy Manual long axis distraction to BLEs, 3 bouts x 20 seconds each leg for pain relief (8 mins manual total); Assisted Hip IR/ER PROM bilaterally  Therapeutic Exercise Ball squeeze/glute squeeze - x 20 with cueing on glute activation Seated hip abduction with GTB - x 20  Lumbar lower trunk rotations, 2 sets x 10 reps with knees together and min VCs to rotate as far as possible without pain  Seated hip abduction with GTB - x 20 Hip IR/ER in sitting for 2 min to decrease pain and spasms          PT Education - 03/10/16 1528    Education provided Yes   Education Details Educated on muscle and pain physiology   Person(s) Educated Patient   Methods Explanation;Demonstration   Comprehension Verbalized understanding;Returned demonstration             PT Long Term  Goals - 03/08/16 1129      PT LONG TERM GOAL #1   Title Patient will be independent in home exercise program to improve strength/mobility for better functional independence with ADLs.   Time 8   Period Weeks   Status On-going     PT LONG TERM GOAL #2   Title Patient (> 72 years old) will complete five times sit to stand test in < 15 seconds indicating an increased LE strength and improved balance.   Baseline greater than 30 sec, 14.35 seconds with use of UEs on 03/08/16   Time 8   Period Weeks   Status Partially Met     PT LONG TERM GOAL #3   Title Patient will increase six minute walk test distance to >1000 for progression to community ambulator and improve gait ability   Baseline 300 feet, 378f on 03/08/16   Time 8   Period Weeks   Status Partially Met     PT LONG  TERM GOAL #4   Title Patient will report a worst pain of 3/10 on VAS in low back   to improve tolerance with ADLs and reduced symptoms with activities.    Baseline 7/10, 4/10 on 03/08/16   Time 8   Period Weeks     PT LONG TERM GOAL #5   Title  Patient will be able to perform household work/ chores without increase in symptoms.   Time 8   Period Weeks   Status On-going               Plan - 03/10/16 1530    Clinical Impression Statement Patient reports increased LBP when entering the clinic and focused on performing pain relieving exercises. Patient demonstrates decreased pain after performing manual therapy focused on long axis distraction and pain decreased signiicantly after performing. Patient demonstrates decreased knowledge when raising from supine to sit and increased pain aggravation when transferring to sitting. Patient will benefit from further skilled therapy focused on pain relief to return to prior level of function.    Rehab Potential Fair   Clinical Impairments Affecting Rehab Potential DM, decreased balance   PT Frequency 2x / week   PT Duration 4 weeks   PT Treatment/Interventions Cryotherapy;Aquatic Therapy;Electrical Stimulation;Moist Heat;Traction;Ultrasound;Therapeutic activities;Therapeutic exercise;Balance training;Manual techniques;Gait training   PT Next Visit Plan Therapeutic exercise and manual therapy   PT Home Exercise Plan added intermittent walking    Consulted and Agree with Plan of Care Patient      Patient will benefit from skilled therapeutic intervention in order to improve the following deficits and impairments:  Decreased balance, Decreased mobility, Difficulty walking, Decreased range of motion, Decreased strength, Impaired flexibility, Pain, Obesity, Impaired sensation, Increased edema, Decreased activity tolerance  Visit Diagnosis: Muscle weakness (generalized)  Midline low back pain with sciatica, sciatica laterality unspecified,  unspecified chronicity     Problem List There are no active problems to display for this patient.   WBlythe Stanford PT DPT 03/10/2016, 3:48 PM  [place] [facility] [address] [address]Phone: [phone]  Fax:  [phone] Name: Kelsey BevilacquaMRN: [id number] of Birth: 3October 31, 1947

## 2016-03-15 ENCOUNTER — Ambulatory Visit: Payer: Commercial Managed Care - HMO | Admitting: Physical Therapy

## 2016-03-15 ENCOUNTER — Encounter: Payer: Self-pay | Admitting: Physical Therapy

## 2016-03-15 DIAGNOSIS — M5440 Lumbago with sciatica, unspecified side: Secondary | ICD-10-CM | POA: Diagnosis not present

## 2016-03-15 DIAGNOSIS — M6281 Muscle weakness (generalized): Secondary | ICD-10-CM | POA: Diagnosis not present

## 2016-03-15 NOTE — Therapy (Signed)
[place] MAIN [facility] SERVICES [address] [place], [place], [address] Phone: [phone]   Fax:  [phone]  Physical Therapy Treatment  Patient Details  Name: [name] MRN: [id number] Date of Birth: Sep 12, 1945 Referring Provider: Newman Pies  Encounter Date: 03/15/2016      PT End of Session - 03/15/16 1321    Visit Number 9   Number of Visits 17   Date for PT Re-Evaluation 04-22-16   Authorization Type g codes   Authorization Time Period 9/10   PT Start Time 1115   PT Stop Time 1200   PT Time Calculation (min) 45 min   Equipment Utilized During Treatment Gait belt   Activity Tolerance Patient limited by pain;Patient limited by fatigue   Behavior During Therapy [facility] for tasks assessed/performed      Past Medical History:  Diagnosis Date  . Adenomatous colon polyp   . Arthritis   . Chronic kidney disease   . Diabetes mellitus without complication ([place])   . Dysuria   . Hypertension   . Osteopenia   . Sleep apnea   . Stroke ([place])   . Urine, incontinence, stress female     Past Surgical History:  Procedure Laterality Date  . COLONOSCOPY W/ POLYPECTOMY Right   . COLONOSCOPY WITH PROPOFOL N/A 01/09/2015   Procedure: COLONOSCOPY WITH PROPOFOL;  Surgeon: Manya Silvas, MD;  Location: [facility] ENDOSCOPY;  Service: Endoscopy;  Laterality: N/A;  . JOINT REPLACEMENT    . LITHOTRIPSY    . ltk Left   . throidectomy      There were no vitals filed for this visit.      Subjective Assessment - 03/15/16 1119    Subjective Pt reports that Saturday she had a lot of pain but that it got better on Sunday.     Pertinent History CVA, diabetes, high blood pressure, kidney surgery, thyroid surgery, left knee replacement,   Limitations Walking;Standing   How long can you sit comfortably? a long time   How long can you stand comfortably? not very long   How long can you walk comfortably? not very far using a walker   Patient Stated Goals to have less pain and to walk more   Currently in Pain? Yes   Pain Score 2   constant pain 2/10 and intermittent pain 8/10    Pain Location Back   Pain Orientation Lower   Pain Descriptors / Indicators Aching   Pain Type Chronic pain      Treatment Nustep x 4 mins BLEs level 2 (unbilled)  Self hamstring stretch, 2 bouts x 20 seconds each leg, min VCs to increase forward flexion until proper stretch  Scapular retractions in seated position with red theraband resistance, 2 sets x 10 reps, min VCs for upright posture   Manual long axis distraction each LE, 3 bouts x 20 seconds for pain relief  Manual piriformis stretch, 2 bouts x 30 seconds each leg in hooklying position, pt reported good stretch in hips  TrA maches with #1.5 pound weight on each leg, 1 set x 10 reps, min VCs to increase core activation and maintain  Sidestepping x 4 laps with yellow theraband resistance, 2 HHA for stability, min VCs for upright posture throughout  Low row seated on cable machine, #7.5, 2 sets x 10 reps, min VCs for upright posture and eccentric control  PT Education - 03/15/16 1201    Education provided Yes   Education Details reinforced intermittent walking program    Person(s) Educated Patient   Methods Explanation   Comprehension Verbalized understanding;Returned demonstration;Verbal cues required             PT Long Term Goals - 03/08/16 1129      PT LONG TERM GOAL #1   Title Patient will be independent in home exercise program to improve strength/mobility for better functional independence with ADLs.   Time 8   Period Weeks   Status On-going     PT LONG TERM GOAL #2   Title Patient (> 23 years old) will complete five times sit to stand test in < 15 seconds indicating an increased LE strength and improved balance.   Baseline greater than 30 sec, 14.35 seconds with use of UEs on 03/08/16   Time 8   Period Weeks    Status Partially Met     PT LONG TERM GOAL #3   Title Patient will increase six minute walk test distance to >1000 for progression to community ambulator and improve gait ability   Baseline 300 feet, 364f on 03/08/16   Time 8   Period Weeks   Status Partially Met     PT LONG TERM GOAL #4   Title Patient will report a worst pain of 3/10 on VAS in low back   to improve tolerance with ADLs and reduced symptoms with activities.    Baseline 7/10, 4/10 on 03/08/16   Time 8   Period Weeks     PT LONG TERM GOAL #5   Title  Patient will be able to perform household work/ chores without increase in symptoms.   Time 8   Period Weeks   Status On-going               Plan - 03/15/16 1322    Clinical Impression Statement Pt continues to report decreased pain with intermittent twinges of pain that increases her pain.  Pt reports compliance with her HEP.  Pt did not have increased pain from supine <>sit today as she did last session.  Pt continues to report pain relief with manual long axis distraction.  She was able to progress TrA marches with #1.5 on either leg with min VCs to maintain core activation.  Encouraged pt to progress her intermittent walking to 3 min 3 bouts of walking.  She would benefit from further skilled PT to work on decreasing low back pain, increasing hip and glut strength for more functional mobility.     Rehab Potential Fair   Clinical Impairments Affecting Rehab Potential DM, decreased balance   PT Frequency 2x / week   PT Duration 4 weeks   PT Treatment/Interventions Cryotherapy;Aquatic Therapy;Electrical Stimulation;Moist Heat;Traction;Ultrasound;Therapeutic activities;Therapeutic exercise;Balance training;Manual techniques;Gait training   PT Next Visit Plan Therapeutic exercise and manual therapy   PT Home Exercise Plan added intermittent walking    Consulted and Agree with Plan of Care Patient      Patient will benefit from skilled therapeutic intervention in  order to improve the following deficits and impairments:  Decreased balance, Decreased mobility, Difficulty walking, Decreased range of motion, Decreased strength, Impaired flexibility, Pain, Obesity, Impaired sensation, Increased edema, Decreased activity tolerance  Visit Diagnosis: Muscle weakness (generalized)  Midline low back pain with sciatica, sciatica laterality unspecified, unspecified chronicity     Problem List There are no active problems to display for this patient.  TStacy Gardner SPT  This  entire session was performed under direct supervision and direction of a licensed Chiropractor . I have personally read, edited and approve of the note as written.  [name],[name] PT, DPT 03/15/2016, 1:43 PM  [place] MAIN [facility] SERVICES [address] [place], [place], [address] Phone: [phone]   Fax:  [phone]  Name: [name] MRN: [id number] Date of Birth: 1945-06-16

## 2016-03-17 ENCOUNTER — Encounter: Payer: Self-pay | Admitting: Physical Therapy

## 2016-03-17 ENCOUNTER — Ambulatory Visit: Payer: Commercial Managed Care - HMO | Admitting: Physical Therapy

## 2016-03-17 DIAGNOSIS — M5440 Lumbago with sciatica, unspecified side: Secondary | ICD-10-CM

## 2016-03-17 DIAGNOSIS — M6281 Muscle weakness (generalized): Secondary | ICD-10-CM

## 2016-03-17 NOTE — Therapy (Signed)
[place] MAIN [facility] SERVICES [address] [place], [place], [address] Phone: [phone]   Fax:  [phone]  Physical Therapy Treatment  Patient Details  Name: [name] MRN: [id number] Date of Birth: 10-03-1945 Referring Provider: Newman Pies  Encounter Date: 03/17/2016      PT End of Session - 03/17/16 1201    Visit Number 10   Number of Visits 17   Date for PT Re-Evaluation 05-May-2016   Authorization Type g codes   Authorization Time Period 10/10   PT Start Time 1115   PT Stop Time 1200   PT Time Calculation (min) 45 min   Equipment Utilized During Treatment Gait belt   Activity Tolerance Patient limited by pain   Behavior During Therapy [facility] for tasks assessed/performed      Past Medical History:  Diagnosis Date  . Adenomatous colon polyp   . Arthritis   . Chronic kidney disease   . Diabetes mellitus without complication ([place])   . Dysuria   . Hypertension   . Osteopenia   . Sleep apnea   . Stroke ([place])   . Urine, incontinence, stress female     Past Surgical History:  Procedure Laterality Date  . COLONOSCOPY W/ POLYPECTOMY Right   . COLONOSCOPY WITH PROPOFOL N/A 01/09/2015   Procedure: COLONOSCOPY WITH PROPOFOL;  Surgeon: Manya Silvas, MD;  Location: [facility] ENDOSCOPY;  Service: Endoscopy;  Laterality: N/A;  . JOINT REPLACEMENT    . LITHOTRIPSY    . ltk Left   . throidectomy      There were no vitals filed for this visit.      Subjective Assessment - 03/17/16 1117    Subjective Pt reports she is feeling pretty good today with pain at 7/10.     Pertinent History CVA, diabetes, high blood pressure, kidney surgery, thyroid surgery, left knee replacement,   Limitations Walking;Standing   How long can you sit comfortably? a long time   How long can you stand comfortably? not very long   How long can you walk comfortably? not very far using a walker   Patient Stated Goals to have less pain and to  walk more   Currently in Pain? Yes   Pain Score 7    Pain Location Back   Pain Orientation Lower   Pain Descriptors / Indicators Aching   Pain Type Chronic pain     Treatment Nustep x 4 mins BLEs level 3 (unbilled)  Manual long axis distraction in supine, 3 bouts x 20 seconds each leg for pain relief (Manual for 8 mins) Upper trunk rotations, 1 set x 10 reps each side, min VCs to turn head with each rotation and keep knees stacked the entire time Hooklying half suitcase crunch with ball between legs, 2 sets x 5 reps, min VCs for proper breathing technique  [place] twists seated with unweighted ball, 2 sets x 10 reps, min VCs for upright posture  Seated mid rows on cable machine, 2 sets x 10 reps, #7.5, min VCs for upright posture and increased scapular retraction  Sidestepping with red theraband resistance, x 4 laps with 2 HHA, min VCs to increase lateral step length and maintain upright posture  Seated stability ball roll outs to increase forward flexion tolerance, 1 set x 10 reps, min VCs to relax cervical spine and gradually increase motion  PT Education - 04/11/16 1200    Education provided Yes   Education Details contining with walking program    Person(s) Educated Patient   Methods Explanation   Comprehension Verbalized understanding;Returned demonstration;Verbal cues required             PT Long Term Goals - 03/08/16 1129      PT LONG TERM GOAL #1   Title Patient will be independent in home exercise program to improve strength/mobility for better functional independence with ADLs.   Time 8   Period Weeks   Status On-going     PT LONG TERM GOAL #2   Title Patient (> 66 years old) will complete five times sit to stand test in < 15 seconds indicating an increased LE strength and improved balance.   Baseline greater than 30 sec, 14.35 seconds with use of UEs on 03/08/16   Time 8   Period Weeks   Status Partially Met      PT LONG TERM GOAL #3   Title Patient will increase six minute walk test distance to >1000 for progression to community ambulator and improve gait ability   Baseline 300 feet, 326f on 03/08/16   Time 8   Period Weeks   Status Partially Met     PT LONG TERM GOAL #4   Title Patient will report a worst pain of 3/10 on VAS in low back   to improve tolerance with ADLs and reduced symptoms with activities.    Baseline 7/10, 4/10 on 03/08/16   Time 8   Period Weeks     PT LONG TERM GOAL #5   Title  Patient will be able to perform household work/ chores without increase in symptoms.   Time 8   Period Weeks   Status On-going               Plan - 112/03/171257    Clinical Impression Statement Pt was able to lay flatter today than previous sessions without pain transitioning from supine <>sit.  Core strengthening was progressed with half suitcase crunch with unweighted ball between knees.  Pt required min VCs for proper breathing techinque to be done appropriately.  Progressed to standing mid rows on cable machine, pt performed without LOB and appropriate scapular retraction.  Reinforced HEP and walking program for home.  Pt would benefit from further skilled PT to decrease pain, increase spinal mobility and LE strength for more functional independence.     Rehab Potential Fair   Clinical Impairments Affecting Rehab Potential DM, decreased balance   PT Frequency 2x / week   PT Duration 4 weeks   PT Treatment/Interventions Cryotherapy;Aquatic Therapy;Electrical Stimulation;Moist Heat;Traction;Ultrasound;Therapeutic activities;Therapeutic exercise;Balance training;Manual techniques;Gait training   PT Next Visit Plan Therapeutic exercise and manual therapy   PT Home Exercise Plan added intermittent walking    Consulted and Agree with Plan of Care Patient      Patient will benefit from skilled therapeutic intervention in order to improve the following deficits and impairments:   Decreased balance, Decreased mobility, Difficulty walking, Decreased range of motion, Decreased strength, Impaired flexibility, Pain, Obesity, Impaired sensation, Increased edema, Decreased activity tolerance  Visit Diagnosis: Muscle weakness (generalized)  Midline low back pain with sciatica, sciatica laterality unspecified, unspecified chronicity       G-Codes - 112/03/20171453    Functional Assessment Tool Used tug, 10 MW, 6 mw, 5 x sit to stand, clinical judgement   Functional Limitation Mobility: Walking and moving around   Mobility: Walking  and Moving Around Current Status [phone]) At least 20 percent but less than 40 percent impaired, limited or restricted   Mobility: Walking and Moving Around Goal Status [phone]) At least 20 percent but less than 40 percent impaired, limited or restricted      Problem List There are no active problems to display for this patient.  [name], SPT  This entire session was performed under direct supervision and direction of a licensed therapist/therapist assistant . I have personally read, edited and approve of the note as written.  [name],[name] PT, DPT 03/17/2016, 3:06 PM  [place] MAIN [facility] SERVICES [address] [place], [place], [address] Phone: [phone]   Fax:  [phone]  Name: [name] MRN: [id number] Date of Birth: [birth year]

## 2016-03-22 ENCOUNTER — Ambulatory Visit: Payer: Commercial Managed Care - HMO

## 2016-03-22 DIAGNOSIS — M6281 Muscle weakness (generalized): Secondary | ICD-10-CM | POA: Diagnosis not present

## 2016-03-22 DIAGNOSIS — M5440 Lumbago with sciatica, unspecified side: Secondary | ICD-10-CM | POA: Diagnosis not present

## 2016-03-23 NOTE — Therapy (Signed)
[place] MAIN [facility] SERVICES [address] [place], [place], [address] Phone: [phone]   Fax:  [phone]  Physical Therapy Treatment  Patient Details  Name: [name] MRN: [id number] Date of Birth: [birth year] Referring Provider: Newman Pies  Encounter Date: 03/22/2016      PT End of Session - 03/23/16 1243    Visit Number 11   Number of Visits 17   Date for PT Re-Evaluation 2016/04/21   Authorization Type g codes   Authorization Time Period 1/10   PT Start Time 1515   PT Stop Time 1600   PT Time Calculation (min) 45 min   Equipment Utilized During Treatment Gait belt   Activity Tolerance Patient limited by pain   Behavior During Therapy [facility] for tasks assessed/performed      Past Medical History:  Diagnosis Date  . Adenomatous colon polyp   . Arthritis   . Chronic kidney disease   . Diabetes mellitus without complication ([place])   . Dysuria   . Hypertension   . Osteopenia   . Sleep apnea   . Stroke ([place])   . Urine, incontinence, stress female     Past Surgical History:  Procedure Laterality Date  . COLONOSCOPY W/ POLYPECTOMY Right   . COLONOSCOPY WITH PROPOFOL N/A 01/09/2015   Procedure: COLONOSCOPY WITH PROPOFOL;  Surgeon: Manya Silvas, MD;  Location: [facility] ENDOSCOPY;  Service: Endoscopy;  Laterality: N/A;  . JOINT REPLACEMENT    . LITHOTRIPSY    . ltk Left   . throidectomy      There were no vitals filed for this visit.      Subjective Assessment - 03/22/16 1837    Subjective Pt reports her pain is not terribly aggravated today. States her back is feeling better since her previous treatment.    Pertinent History CVA, diabetes, high blood pressure, kidney surgery, thyroid surgery, left knee replacement,   Limitations Walking;Standing   How long can you sit comfortably? a long time   How long can you stand comfortably? not very long   How long can you walk comfortably? not very far using a  walker   Patient Stated Goals to have less pain and to walk more   Currently in Pain? Yes   Pain Score 7    Pain Location Back   Pain Orientation Lower   Pain Descriptors / Indicators Aching   Pain Type Chronic pain      Treatment Nustep x 7 mins BLEs level 2  Manual long axis distraction in supine, 3 bouts x 20 seconds each leg for pain relief (Manual for 8 mins) Ball squeeze/glute squeeze in hooklying - 2 x 15 Marches to knee extension with patient in hooklying with GTB around knees - x 10  Seated stability ball roll outs to increase forward flexion tolerance, 2 set x 15 reps, min VCs to relax cervical spine and gradually increase motion Sidestepping with red theraband resistance, x 4 laps with HHA, min VCs to increase lateral step length and maintain upright posture  Seated hip abduction with red band - x 15  Russian twists seated with unweighted ball, 15 reps, min VCs for upright posture  Upper trunk rotations, 1 set x 10 reps each side, min VCs to turn head with each rotation in standing for dynamic balance      PT Education - 03/22/16 1843    Education provided Yes   Education Details Continue with HEP   Person(s) Educated Patient  Methods Explanation   Comprehension Verbalized understanding             PT Long Term Goals - 03/08/16 1129      PT LONG TERM GOAL #1   Title Patient will be independent in home exercise program to improve strength/mobility for better functional independence with ADLs.   Time 8   Period Weeks   Status On-going     PT LONG TERM GOAL #2   Title Patient (> 15 years old) will complete five times sit to stand test in < 15 seconds indicating an increased LE strength and improved balance.   Baseline greater than 30 sec, 14.35 seconds with use of UEs on 03/08/16   Time 8   Period Weeks   Status Partially Met     PT LONG TERM GOAL #3   Title Patient will increase six minute walk test distance to >1000 for progression to community  ambulator and improve gait ability   Baseline 300 feet, 327f on 03/08/16   Time 8   Period Weeks   Status Partially Met     PT LONG TERM GOAL #4   Title Patient will report a worst pain of 3/10 on VAS in low back   to improve tolerance with ADLs and reduced symptoms with activities.    Baseline 7/10, 4/10 on 03/08/16   Time 8   Period Weeks     PT LONG TERM GOAL #5   Title  Patient will be able to perform household work/ chores without increase in symptoms.   Time 8   Period Weeks   Status On-going               Plan - 03/23/16 1244    Clinical Impression Statement Patient continues to tolerate flatter laying today indicating functional carryover between treatment sessions. Continued with core stabilization exercises and improving weight bearing tolerance in standing. Patient required increased sitting rest breaks throughout session indicating decreased endurance and patient will benefit from further skilled therapy to return to decrease ball risk.    Rehab Potential Fair   Clinical Impairments Affecting Rehab Potential DM, decreased balance   PT Frequency 2x / week   PT Duration 4 weeks   PT Treatment/Interventions Cryotherapy;Aquatic Therapy;Electrical Stimulation;Moist Heat;Traction;Ultrasound;Therapeutic activities;Therapeutic exercise;Balance training;Manual techniques;Gait training   PT Next Visit Plan Therapeutic exercise and manual therapy   PT Home Exercise Plan added intermittent walking    Consulted and Agree with Plan of Care Patient      Patient will benefit from skilled therapeutic intervention in order to improve the following deficits and impairments:  Decreased balance, Decreased mobility, Difficulty walking, Decreased range of motion, Decreased strength, Impaired flexibility, Pain, Obesity, Impaired sensation, Increased edema, Decreased activity tolerance  Visit Diagnosis: Muscle weakness (generalized)  Midline low back pain with sciatica, sciatica  laterality unspecified, unspecified chronicity     Problem List There are no active problems to display for this patient.   WBlythe Stanford PT DPT 03/23/2016, 1:00 PM  [place] [facility] [address] [address]Phone: [phone]  Fax:  [phone] Name: PTessi EustacheMRN: [id number] of Birth: 309-27-1947

## 2016-03-24 ENCOUNTER — Encounter: Payer: Self-pay | Admitting: Physical Therapy

## 2016-03-24 ENCOUNTER — Ambulatory Visit: Payer: Commercial Managed Care - HMO | Admitting: Physical Therapy

## 2016-03-24 DIAGNOSIS — M6281 Muscle weakness (generalized): Secondary | ICD-10-CM | POA: Diagnosis not present

## 2016-03-24 DIAGNOSIS — M5440 Lumbago with sciatica, unspecified side: Secondary | ICD-10-CM

## 2016-03-24 NOTE — Patient Instructions (Addendum)
    Hamstring Step 1    Straighten left knee. Keep knee level with other knee or on bolster. Hold ___ seconds. Relax knee by returning foot to start. Repeat ___ times.  Copyright  VHI. All rights reserved.

## 2016-03-24 NOTE — Therapy (Signed)
[place] MAIN [facility] SERVICES [address] [place], [place], [address] Phone: [phone]   Fax:  [phone]  Physical Therapy Treatment  Patient Details  Name: [name] MRN: [id number] Date of Birth: October 09, 1945 Referring Provider: Newman Pies  Encounter Date: 03/24/2016      PT End of Session - 03/24/16 1328    Visit Number 12   Number of Visits 17   Date for PT Re-Evaluation April 22, 2016   Authorization Type g codes   Authorization Time Period 2/10   PT Start Time 1300   PT Stop Time 1345   PT Time Calculation (min) 45 min   Equipment Utilized During Treatment Gait belt   Activity Tolerance Patient limited by pain   Behavior During Therapy [facility] for tasks assessed/performed      Past Medical History:  Diagnosis Date  . Adenomatous colon polyp   . Arthritis   . Chronic kidney disease   . Diabetes mellitus without complication ([place])   . Dysuria   . Hypertension   . Osteopenia   . Sleep apnea   . Stroke ([place])   . Urine, incontinence, stress female     Past Surgical History:  Procedure Laterality Date  . COLONOSCOPY W/ POLYPECTOMY Right   . COLONOSCOPY WITH PROPOFOL N/A 01/09/2015   Procedure: COLONOSCOPY WITH PROPOFOL;  Surgeon: Manya Silvas, MD;  Location: [facility] ENDOSCOPY;  Service: Endoscopy;  Laterality: N/A;  . JOINT REPLACEMENT    . LITHOTRIPSY    . ltk Left   . throidectomy      There were no vitals filed for this visit.      Subjective Assessment - 03/24/16 1300    Subjective Pt reports that she had some back pain this morning but did some of the exercises and then it worked itself out.     Pertinent History CVA, diabetes, high blood pressure, kidney surgery, thyroid surgery, left knee replacement,   Limitations Walking;Standing   How long can you sit comfortably? a long time   How long can you stand comfortably? not very long   How long can you walk comfortably? not very far using a walker   Patient Stated Goals to have less pain and to walk more   Currently in Pain? No/denies      Treatment Nustep x 4 mins BLEs level 2 (unbilled)  Manual long axis distraction to BLEs, 2 bouts x 30 seconds each for pain relief in hooklying position  Lower trunk rotations, 2 sets x 10 reps, min VCs to increase ROM but not into pain   Lower suitcase crunch with green ball between knees, 2 sets x 10 reps, min verbal cues to keep core tight and proper breathing   Hamstring stretch AAROM, 2 sets x 20 seconds each leg, min VCs for proper positioning and to keep other leg in hooklying position   [place] twists with #2 seated, min VCs for upright posture and increase thoracic rotation  Mid rows standing on cable machine, #7.5, 2 sets x 10 reps, min tactile cues for increased scapular retraction  Low row standing on cable machine, #4, 2 sets x 10 reps, min VCS to keep elbows extended  Thoracic ball roll outs seated with stability ball, 2 sets x 10 reps, min VCs to tuck head for increased thoracic flexion  Seated ball adduction/glut squeezes, 2 sets x 10 reps, min VCs for upright posture seated at front of chair  PT Education - 03/24/16 1327    Education provided Yes   Education Details added AAROM hamstring stretch    Person(s) Educated Patient   Methods Explanation;Demonstration;Verbal cues   Comprehension Returned demonstration;Verbal cues required;Verbalized understanding             PT Long Term Goals - 03/08/16 1129      PT LONG TERM GOAL #1   Title Patient will be independent in home exercise program to improve strength/mobility for better functional independence with ADLs.   Time 8   Period Weeks   Status On-going     PT LONG TERM GOAL #2   Title Patient (> 43 years old) will complete five times sit to stand test in < 15 seconds indicating an increased LE strength and improved balance.   Baseline greater than 30 sec, 14.35 seconds  with use of UEs on 03/08/16   Time 8   Period Weeks   Status Partially Met     PT LONG TERM GOAL #3   Title Patient will increase six minute walk test distance to >1000 for progression to community ambulator and improve gait ability   Baseline 300 feet, 336f on 03/08/16   Time 8   Period Weeks   Status Partially Met     PT LONG TERM GOAL #4   Title Patient will report a worst pain of 3/10 on VAS in low back   to improve tolerance with ADLs and reduced symptoms with activities.    Baseline 7/10, 4/10 on 03/08/16   Time 8   Period Weeks     PT LONG TERM GOAL #5   Title  Patient will be able to perform household work/ chores without increase in symptoms.   Time 8   Period Weeks   Status On-going               Plan - 03/24/16 1434    Clinical Impression Statement Pt reports that overall her back has been feeling better.  She reports today no pain for the first time in all of our therapy sessions.  Pt was able to perform core exercise today with increased reps compared to last session while performing with proper techinique.  Added supine hamstring stretch into HEP, pt was able to tolerate without an increase in LBP.  She would continue to benefit from further hip and glut strengthenign for greater functional mobility.     Rehab Potential Fair   Clinical Impairments Affecting Rehab Potential DM, decreased balance   PT Frequency 2x / week   PT Duration 4 weeks   PT Treatment/Interventions Cryotherapy;Aquatic Therapy;Electrical Stimulation;Moist Heat;Traction;Ultrasound;Therapeutic activities;Therapeutic exercise;Balance training;Manual techniques;Gait training   PT Next Visit Plan Therapeutic exercise and manual therapy   PT Home Exercise Plan added intermittent walking    Consulted and Agree with Plan of Care Patient      Patient will benefit from skilled therapeutic intervention in order to improve the following deficits and impairments:  Decreased balance, Decreased  mobility, Difficulty walking, Decreased range of motion, Decreased strength, Impaired flexibility, Pain, Obesity, Impaired sensation, Increased edema, Decreased activity tolerance  Visit Diagnosis: Muscle weakness (generalized)  Midline low back pain with sciatica, sciatica laterality unspecified, unspecified chronicity     Problem List There are no active problems to display for this patient.  TStacy Gardner SPT  This entire session was performed under direct supervision and direction of a licensed therapist/therapist assistant . I have personally read, edited and approve of the note as written.  [name],[name] PT, DPT 03/24/2016, 2:57 PM  [place] MAIN [facility] SERVICES [address] [place], [place], [address] Phone: [phone]   Fax:  [phone]  Name: [name] MRN: [id number] Date of Birth: Apr 05, 1946

## 2016-03-29 ENCOUNTER — Encounter: Payer: Commercial Managed Care - HMO | Admitting: Physical Therapy

## 2016-03-30 ENCOUNTER — Ambulatory Visit: Payer: Commercial Managed Care - HMO

## 2016-03-30 DIAGNOSIS — M6281 Muscle weakness (generalized): Secondary | ICD-10-CM

## 2016-03-30 DIAGNOSIS — M5440 Lumbago with sciatica, unspecified side: Secondary | ICD-10-CM

## 2016-03-30 NOTE — Therapy (Signed)
[place] MAIN [facility] SERVICES [address] [place], [place], [address] Phone: [phone]   Fax:  [phone]  Physical Therapy Treatment  Patient Details  Name: [name] MRN: [id number] Date of Birth: 09-28-1945 Referring Provider: Newman Pies  Encounter Date: 03/30/2016      PT End of Session - 03/30/16 1506    Visit Number 13   Number of Visits 17   Date for PT Re-Evaluation Apr 21, 2016   Authorization Type g codes   Authorization Time Period 3/10   PT Start Time 1430   PT Stop Time 1515   PT Time Calculation (min) 45 min   Equipment Utilized During Treatment Gait belt   Activity Tolerance Patient limited by pain   Behavior During Therapy [facility] for tasks assessed/performed      Past Medical History:  Diagnosis Date  . Adenomatous colon polyp   . Arthritis   . Chronic kidney disease   . Diabetes mellitus without complication ([place])   . Dysuria   . Hypertension   . Osteopenia   . Sleep apnea   . Stroke ([place])   . Urine, incontinence, stress female     Past Surgical History:  Procedure Laterality Date  . COLONOSCOPY W/ POLYPECTOMY Right   . COLONOSCOPY WITH PROPOFOL N/A 01/09/2015   Procedure: COLONOSCOPY WITH PROPOFOL;  Surgeon: Manya Silvas, MD;  Location: [facility] ENDOSCOPY;  Service: Endoscopy;  Laterality: N/A;  . JOINT REPLACEMENT    . LITHOTRIPSY    . ltk Left   . throidectomy      There were no vitals filed for this visit.      Subjective Assessment - 03/30/16 1444    Subjective Pt reports she walked around the grocery store and reports her legs are slightly sore. States she was able to drive from [place] to [place] without increase in back pain.    Pertinent History CVA, diabetes, high blood pressure, kidney surgery, thyroid surgery, left knee replacement,   Limitations Walking;Standing   How long can you sit comfortably? a long time   How long can you stand comfortably? not very long    How long can you walk comfortably? not very far using a walker   Patient Stated Goals to have less pain and to walk more   Currently in Pain? No/denies      Treatment Leg Press quantum - 3 x 10 105# cueing on knee positioning and speed with exercise. Seated ball roll outs in sitting - 2 min for lumbar mobility Seated ball adduction/glut squeezes, 2 sets x 10 reps, min VCs for upright posture seated at front of chair Manual long axis distraction to BLEs, 2 bouts x 30 seconds each for pain relief in hooklying position Heel slides with TrA activation in hooklying - 2 x 10 Russian twists with #2 seated, min VCs for upright posture and increase thoracic rotation  Multifidus crunch with tactile cueing on lumbar positioning in sitting - 2 x 10  Standing hip abduction in standing - 2 x 15        PT Education - 03/30/16 1503    Education provided Yes   Education Details HEP: multifidus crunch   Person(s) Educated Patient   Methods Explanation;Demonstration   Comprehension Verbalized understanding;Returned demonstration             PT Long Term Goals - 03/08/16 1129      PT LONG TERM GOAL #1   Title Patient will be independent in home  exercise program to improve strength/mobility for better functional independence with ADLs.   Time 8   Period Weeks   Status On-going     PT LONG TERM GOAL #2   Title Patient (> 4 years old) will complete five times sit to stand test in < 15 seconds indicating an increased LE strength and improved balance.   Baseline greater than 30 sec, 14.35 seconds with use of UEs on 03/08/16   Time 8   Period Weeks   Status Partially Met     PT LONG TERM GOAL #3   Title Patient will increase six minute walk test distance to >1000 for progression to community ambulator and improve gait ability   Baseline 300 feet, 357f on 03/08/16   Time 8   Period Weeks   Status Partially Met     PT LONG TERM GOAL #4   Title Patient will report a worst pain of 3/10 on  VAS in low back   to improve tolerance with ADLs and reduced symptoms with activities.    Baseline 7/10, 4/10 on 03/08/16   Time 8   Period Weeks     PT LONG TERM GOAL #5   Title  Patient will be able to perform household work/ chores without increase in symptoms.   Time 8   Period Weeks   Status On-going               Plan - 03/30/16 1511    Clinical Impression Statement Pt reports decreased back pain today versus previous visits indicating functional carryover between visits. Performed increased sitting exercises secondary to LE fatigue. Patient demonstrates decreased TrA activation with exercise requiring tactile and verbal cueing to perform. Patient will benefit from further skilled therapy focused on improving core stabilization to return to prior level of function.    Rehab Potential Fair   Clinical Impairments Affecting Rehab Potential DM, decreased balance   PT Frequency 2x / week   PT Duration 4 weeks   PT Treatment/Interventions Cryotherapy;Aquatic Therapy;Electrical Stimulation;Moist Heat;Traction;Ultrasound;Therapeutic activities;Therapeutic exercise;Balance training;Manual techniques;Gait training   PT Next Visit Plan Therapeutic exercise and manual therapy   PT Home Exercise Plan added intermittent walking    Consulted and Agree with Plan of Care Patient      Patient will benefit from skilled therapeutic intervention in order to improve the following deficits and impairments:  Decreased balance, Decreased mobility, Difficulty walking, Decreased range of motion, Decreased strength, Impaired flexibility, Pain, Obesity, Impaired sensation, Increased edema, Decreased activity tolerance  Visit Diagnosis: Muscle weakness (generalized)  Midline low back pain with sciatica, sciatica laterality unspecified, unspecified chronicity     Problem List There are no active problems to display for this patient.   WBlythe Stanford PT DPT 03/30/2016, 4:20 PM  [place] MountainMAIN [facility]ERVICES [address] [address]Phone: [phone]  Fax:  [phone] Name: Kelsey HamadaMRN: [id number] of Birth: 307-Sep-1947

## 2016-04-05 ENCOUNTER — Ambulatory Visit: Payer: Commercial Managed Care - HMO | Admitting: Physical Therapy

## 2016-04-05 ENCOUNTER — Encounter: Payer: Self-pay | Admitting: Physical Therapy

## 2016-04-05 DIAGNOSIS — M5440 Lumbago with sciatica, unspecified side: Secondary | ICD-10-CM | POA: Diagnosis not present

## 2016-04-05 DIAGNOSIS — M6281 Muscle weakness (generalized): Secondary | ICD-10-CM | POA: Diagnosis not present

## 2016-04-05 NOTE — Therapy (Signed)
[place] MAIN [facility] SERVICES [address] [place], [place], [address] Phone: [phone]   Fax:  [phone]  Physical Therapy Treatment  Patient Details  Name: [name] MRN: [id number] Date of Birth: Nov 04, 1945 Referring Provider: Newman Pies  Encounter Date: 04/05/2016      PT End of Session - 04/05/16 1438    Visit Number 14   Number of Visits 17   Date for PT Re-Evaluation 04/19/16   Authorization Type g codes   Authorization Time Period 4/10   PT Start Time 1430   PT Stop Time 1515   PT Time Calculation (min) 45 min   Activity Tolerance Patient limited by pain   Behavior During Therapy [facility] for tasks assessed/performed      Past Medical History:  Diagnosis Date  . Adenomatous colon polyp   . Arthritis   . Chronic kidney disease   . Diabetes mellitus without complication ([place])   . Dysuria   . Hypertension   . Osteopenia   . Sleep apnea   . Stroke ([place])   . Urine, incontinence, stress female     Past Surgical History:  Procedure Laterality Date  . COLONOSCOPY W/ POLYPECTOMY Right   . COLONOSCOPY WITH PROPOFOL N/A 01/09/2015   Procedure: COLONOSCOPY WITH PROPOFOL;  Surgeon: Manya Silvas, MD;  Location: [facility] ENDOSCOPY;  Service: Endoscopy;  Laterality: N/A;  . JOINT REPLACEMENT    . LITHOTRIPSY    . ltk Left   . throidectomy      There were no vitals filed for this visit.      Subjective Assessment - 04/05/16 1437    Subjective Patient reports doing well today; She denies any pain; She reports having a good Thanksgiving.    Pertinent History CVA, diabetes, high blood pressure, kidney surgery, thyroid surgery, left knee replacement,   Limitations Walking;Standing   How long can you sit comfortably? a long time   How long can you stand comfortably? not very long   How long can you walk comfortably? not very far using a walker   Patient Stated Goals to have less pain and to walk more   Currently in Pain? No/denies          Treatment Warm up on Nustep BLE only level 2 x5 min (Unbilled);  Leg Press quantum  3 x 12, 105# cueing on knee positioning and speed with exercise.  Seated ball adduction/glut squeezes, 2 sets x 12 reps, min VCs for upright posture and core stabilization; [place] twists with small ball , min VCs for upright posture and increase thoracic rotation   Standing hip abduction with yellow tband 2x12 bilaterally with mod VCs to improve posture and to increase core stabilization;  Seated: LAQ 2x10 bilaterally;  March 2x10 bilaterally; Patient required min VCs to increase core stabilization and improve posture for better strengthening and flexibility;  Patient ambulated 100 feet with SPC with mod I, demonstrating reciprocal gait pattern; Patient does report increased low back pain with walking;  Instructed patient in standing posterior pelvic tilts against wall to reduce discomfort. However after 4 reps patient reports increased pain and not being able to stand any longer.  She would benefit from additional skilled intervention to reduce pain in standing;                        PT Education - 04/05/16 1437    Education provided Yes   Education Details HEP; posture;  Person(s) Educated Patient   Methods Explanation;Verbal cues   Comprehension Verbalized understanding;Returned demonstration;Verbal cues required             PT Long Term Goals - 03/08/16 1129      PT LONG TERM GOAL #1   Title Patient will be independent in home exercise program to improve strength/mobility for better functional independence with ADLs.   Time 8   Period Weeks   Status On-going     PT LONG TERM GOAL #2   Title Patient (> 41 years old) will complete five times sit to stand test in < 15 seconds indicating an increased LE strength and improved balance.   Baseline greater than 30 sec, 14.35 seconds with use of UEs on 03/08/16   Time 8    Period Weeks   Status Partially Met     PT LONG TERM GOAL #3   Title Patient will increase six minute walk test distance to >1000 for progression to community ambulator and improve gait ability   Baseline 300 feet, 339f on 03/08/16   Time 8   Period Weeks   Status Partially Met     PT LONG TERM GOAL #4   Title Patient will report a worst pain of 3/10 on VAS in low back   to improve tolerance with ADLs and reduced symptoms with activities.    Baseline 7/10, 4/10 on 03/08/16   Time 8   Period Weeks     PT LONG TERM GOAL #5   Title  Patient will be able to perform household work/ chores without increase in symptoms.   Time 8   Period Weeks   Status On-going               Plan - 04/05/16 1547    Clinical Impression Statement patient reports less back pain today; Instructed patient in seated and standing exercise. She continues to require increased cues for better posture and to improve core stabilization. She is able to initiate core stabilization but will often relax during LE movement. She does demonstrate decreased stance tolerance with increased pain with prolonged standing. She would benefit from additional skilled PT intervention to improve core stabilization and reduce back/hip pain;    Rehab Potential Fair   Clinical Impairments Affecting Rehab Potential DM, decreased balance   PT Frequency 2x / week   PT Duration 4 weeks   PT Treatment/Interventions Cryotherapy;Aquatic Therapy;Electrical Stimulation;Moist Heat;Traction;Ultrasound;Therapeutic activities;Therapeutic exercise;Balance training;Manual techniques;Gait training   PT Next Visit Plan Therapeutic exercise and manual therapy   PT Home Exercise Plan added intermittent walking    Consulted and Agree with Plan of Care Patient      Patient will benefit from skilled therapeutic intervention in order to improve the following deficits and impairments:  Decreased balance, Decreased mobility, Difficulty walking,  Decreased range of motion, Decreased strength, Impaired flexibility, Pain, Obesity, Impaired sensation, Increased edema, Decreased activity tolerance  Visit Diagnosis: Muscle weakness (generalized)  Midline low back pain with sciatica, sciatica laterality unspecified, unspecified chronicity     Problem List There are no active problems to display for this patient.   Kelsie Kramp PT, DPT 04/05/2016, 3:50 PM  [place]ard[place]MAIN RNorthwest Florida Surgery CenterSERVICES [address]RAnderson N[place] [address]Phone: [phone]  Fax:  [phone] Name: Kelsey DonsonMRN: [id number] of Birth: [birth year]

## 2016-04-07 ENCOUNTER — Ambulatory Visit: Payer: Commercial Managed Care - HMO

## 2016-04-07 DIAGNOSIS — M6281 Muscle weakness (generalized): Secondary | ICD-10-CM

## 2016-04-07 DIAGNOSIS — M5440 Lumbago with sciatica, unspecified side: Secondary | ICD-10-CM

## 2016-04-07 NOTE — Therapy (Signed)
[place] MAIN [facility] SERVICES [address] [place], [place], [address] Phone: [phone]   Fax:  [phone]  Physical Therapy Treatment  Patient Details  Name: [name] MRN: [id number] Date of Birth: [birth year] Referring Provider: Newman Pies  Encounter Date: 04/07/2016      PT End of Session - 04/07/16 1524    Visit Number 15   Number of Visits 21   Date for PT Re-Evaluation 2016-05-12   Authorization Type g codes   Authorization Time Period 5/10   PT Start Time 1430   PT Stop Time 1515   PT Time Calculation (min) 45 min   Activity Tolerance Patient limited by pain   Behavior During Therapy [facility] for tasks assessed/performed      Past Medical History:  Diagnosis Date  . Adenomatous colon polyp   . Arthritis   . Chronic kidney disease   . Diabetes mellitus without complication ([place])   . Dysuria   . Hypertension   . Osteopenia   . Sleep apnea   . Stroke ([place])   . Urine, incontinence, stress female     Past Surgical History:  Procedure Laterality Date  . COLONOSCOPY W/ POLYPECTOMY Right   . COLONOSCOPY WITH PROPOFOL N/A 01/09/2015   Procedure: COLONOSCOPY WITH PROPOFOL;  Surgeon: Manya Silvas, MD;  Location: [facility] ENDOSCOPY;  Service: Endoscopy;  Laterality: N/A;  . JOINT REPLACEMENT    . LITHOTRIPSY    . ltk Left   . throidectomy      There were no vitals filed for this visit.      Subjective Assessment - 04/07/16 1436    Subjective Patient reports her back was hurting yesterday and early this morning but reports alleviation of symptoms as the days progresses    Pertinent History CVA, diabetes, high blood pressure, kidney surgery, thyroid surgery, left knee replacement,   Limitations Walking;Standing   How long can you sit comfortably? a long time   How long can you stand comfortably? not very long   How long can you walk comfortably? not very far using a walker   Patient Stated Goals to have less  pain and to walk more   Currently in Pain? No/denies      Treatment Warm up on Nustep BLE only level 1 x5 min (Unbilled); Seated: Seated ball adduction/glut squeezes, 2 sets x 15 reps, min VCs for upright posture and core stabilization; Thoracic ball rolls outs in sitting with large physioball - 2 x 15 Sit to stands - x 10 from raised seat [place] twists with small ball , min VCs for upright posture and increase thoracic rotation  -- x 15 Manual LE traction B LE 2 x1 min Bilaterally to decrease   Patient ambulated 400 feet with SPC with mod I, demonstrating reciprocal gait pattern; Patient does report increased low back pain with walking;    She would benefit from additional skilled intervention to reduce pain in standing;      PT Education - 04/07/16 1523    Education provided Yes   Education Details HEP: ambulation; POC   Person(s) Educated Patient   Methods Explanation;Demonstration   Comprehension Verbalized understanding;Returned demonstration             PT Long Term Goals - 04/07/16 1439      PT LONG TERM GOAL #1   Title Patient will be independent in home exercise program to improve strength/mobility for better functional independence with ADLs.   Time 8  Period Weeks   Status On-going     PT LONG TERM GOAL #2   Title Patient (70 years old) will complete five times sit to stand test in < 15 seconds indicating an increased LE strength and improved balance.   Baseline greater than 30 sec, 14.35 seconds with use of UEs on 03/08/16 04/07/16: 14 secs from raised seat   Time 8   Period Weeks   Status Partially Met     PT LONG TERM GOAL #3   Title Patient will increase six minute walk test distance to >1000 for progression to community ambulator and improve gait ability   Baseline 300 feet, 350f on 03/08/16 04/07/16: 4132f  Time 8   Period Weeks   Status Partially Met     PT LONG TERM GOAL #4   Title Patient will report a worst pain of 3/10 on VAS in low  back   to improve tolerance with ADLs and reduced symptoms with activities.    Baseline 7/10, 4/10 on 03/08/16 04/07/16: 4/10   Time 8   Period Weeks     PT LONG TERM GOAL #5   Title  Patient will be able to perform household work/ chores without increase in symptoms.   Baseline 04/07/16: unable to perform most chores with washing the house but reports ability to was dishes and retrieve clothes for donation.   Time 8   Period Weeks   Status On-going               Plan - 04/07/16 1529    Clinical Impression Statement Patient demonstrates minimal improvement in functional measures (64m73m & 5xSTS)  but was able to perform 5xSTS without use of UE's indicating functional improvement in LE strength. Patient demonstrates improved episodes of pain and would like to continue therapy for 3 more weeks for HEP performance.    Rehab Potential Fair   Clinical Impairments Affecting Rehab Potential DM, decreased balance   PT Frequency 2x / week   PT Duration 4 weeks   PT Treatment/Interventions Cryotherapy;Aquatic Therapy;Electrical Stimulation;Moist Heat;Traction;Ultrasound;Therapeutic activities;Therapeutic exercise;Balance training;Manual techniques;Gait training   PT Next Visit Plan Therapeutic exercise and manual therapy   PT Home Exercise Plan added intermittent walking    Consulted and Agree with Plan of Care Patient      Patient will benefit from skilled therapeutic intervention in order to improve the following deficits and impairments:  Decreased balance, Decreased mobility, Difficulty walking, Decreased range of motion, Decreased strength, Impaired flexibility, Pain, Obesity, Impaired sensation, Increased edema, Decreased activity tolerance  Visit Diagnosis: Muscle weakness (generalized)  Midline low back pain with sciatica, sciatica laterality unspecified, unspecified chronicity     Problem List There are no active problems to display for this patient.   WesBlythe StanfordT  DPT 04/07/2016, 3:58 PM  ConStewardsonIN REHHuey P. Long Medical CenterRVICES [address] [place]one: [phone]:  [phone]: PegKarrina LyeN: [id number] of Birth: [birth year]

## 2016-04-12 ENCOUNTER — Ambulatory Visit: Payer: Commercial Managed Care - HMO | Attending: Physical Medicine and Rehabilitation

## 2016-04-12 DIAGNOSIS — M6281 Muscle weakness (generalized): Secondary | ICD-10-CM

## 2016-04-12 DIAGNOSIS — M5440 Lumbago with sciatica, unspecified side: Secondary | ICD-10-CM | POA: Insufficient documentation

## 2016-04-12 DIAGNOSIS — E119 Type 2 diabetes mellitus without complications: Secondary | ICD-10-CM | POA: Diagnosis not present

## 2016-04-12 NOTE — Therapy (Signed)
[place] MAIN [facility] SERVICES [address] [place], [place], [address] Phone: [phone]   Fax:  [phone]  Physical Therapy Treatment  Patient Details  Name: [name] MRN: [id number] Date of Birth: 24-Feb-1946 Referring Provider: Newman Pies  Encounter Date: 04/12/2016      PT End of Session - 04/12/16 1714    Visit Number 16   Number of Visits 21   Date for PT Re-Evaluation 05/21/16   Authorization Type g codes   Authorization Time Period 6/10   PT Start Time 1645   PT Stop Time 1723   PT Time Calculation (min) 38 min   Activity Tolerance Patient limited by pain   Behavior During Therapy [facility] for tasks assessed/performed      Past Medical History:  Diagnosis Date  . Adenomatous colon polyp   . Arthritis   . Chronic kidney disease   . Diabetes mellitus without complication ([place])   . Dysuria   . Hypertension   . Osteopenia   . Sleep apnea   . Stroke ([place])   . Urine, incontinence, stress female     Past Surgical History:  Procedure Laterality Date  . COLONOSCOPY W/ POLYPECTOMY Right   . COLONOSCOPY WITH PROPOFOL N/A 01/09/2015   Procedure: COLONOSCOPY WITH PROPOFOL;  Surgeon: Manya Silvas, MD;  Location: [facility] ENDOSCOPY;  Service: Endoscopy;  Laterality: N/A;  . JOINT REPLACEMENT    . LITHOTRIPSY    . ltk Left   . throidectomy      There were no vitals filed for this visit.      Subjective Assessment - 04/12/16 1652    Subjective Patient reports she is tired today secondary to walking around [place] today. Patient states she's had a HA over the past two days but reports no HA at this moment.    Pertinent History CVA, diabetes, high blood pressure, kidney surgery, thyroid surgery, left knee replacement,   Limitations Walking;Standing   How long can you sit comfortably? a long time   How long can you stand comfortably? not very long   How long can you walk comfortably? not very far using a walker   Patient Stated Goals to have less pain and to walk more   Currently in Pain? No/denies      Treatment Warm up on Nustep BLE only level 2 x5 min (Unbilled); Seated: Leg press at Quantum with cueing on knee and hip placement - x15 at 105#, x15 at 90# (lowered weight to decrease back pain) Supine marches in supine focused on improving TrA activation - 2x 15 Supine ball adduction/glute squeezes with mini bridge-- 2 sets x 15 reps, min VCs for upright posture and core stabilization; Manual LE traction B LE 2 x2 min Bilaterally to decrease Thoracic ball rolls outs in sitting with large physioball - 2 x 15 Sit to stands - x 10 from raised seat  She would benefit from additional skilled intervention to reduce pain in standing;         PT Education - 04/12/16 1714    Education provided Yes   Education Details Educated on performing TrA contraction with movement   Person(s) Educated Patient   Methods Explanation;Demonstration   Comprehension Verbalized understanding             PT Long Term Goals - 04/07/16 1439      PT LONG TERM GOAL #1   Title Patient will be independent in home exercise program to improve strength/mobility for better  functional independence with ADLs.   Time 8   Period Weeks   Status On-going     PT LONG TERM GOAL #2   Title Patient (> 71 years old) will complete five times sit to stand test in < 15 seconds indicating an increased LE strength and improved balance.   Baseline greater than 30 sec, 14.35 seconds with use of UEs on 03/08/16 04/07/16: 14 secs from raised seat   Time 8   Period Weeks   Status Partially Met     PT LONG TERM GOAL #3   Title Patient will increase six minute walk test distance to >1000 for progression to community ambulator and improve gait ability   Baseline 300 feet, 342f on 03/08/16 04/07/16: 4119f  Time 8   Period Weeks   Status Partially Met     PT LONG TERM GOAL #4   Title Patient will report a worst pain of 3/10 on  VAS in low back   to improve tolerance with ADLs and reduced symptoms with activities.    Baseline 7/10, 4/10 on 03/08/16 04/07/16: 4/10   Time 8   Period Weeks     PT LONG TERM GOAL #5   Title  Patient will be able to perform household work/ chores without increase in symptoms.   Baseline 04/07/16: unable to perform most chores with washing the house but reports ability to was dishes and retrieve clothes for donation.   Time 8   Period Weeks   Status On-going               Plan - 04/12/16 1717    Clinical Impression Statement Performed greater amount of exercises in sitting today as patient was fatigued from increased walking during the day. Patient demonstrates increased difficulty with  transfering from supine to sitting resulting in increased pain and spasms and patient will benefit from further skilled therapy focused on performing core stabilization exercise to decrease pain with movement and decrease fall risk.    Rehab Potential Fair   Clinical Impairments Affecting Rehab Potential DM, decreased balance   PT Frequency 2x / week   PT Duration 4 weeks   PT Treatment/Interventions Cryotherapy;Aquatic Therapy;Electrical Stimulation;Moist Heat;Traction;Ultrasound;Therapeutic activities;Therapeutic exercise;Balance training;Manual techniques;Gait training   PT Next Visit Plan Therapeutic exercise and manual therapy   PT Home Exercise Plan added intermittent walking    Consulted and Agree with Plan of Care Patient      Patient will benefit from skilled therapeutic intervention in order to improve the following deficits and impairments:  Decreased balance, Decreased mobility, Difficulty walking, Decreased range of motion, Decreased strength, Impaired flexibility, Pain, Obesity, Impaired sensation, Increased edema, Decreased activity tolerance  Visit Diagnosis: Muscle weakness (generalized)  Midline low back pain with sciatica, sciatica laterality unspecified, unspecified  chronicity     Problem List There are no active problems to display for this patient.   WeBlythe StanfordPT DPT 04/12/2016, 5:48 PM  CoMagnoliaAIN REWhitman Hospital And Medical CenterERVICES [address]dJewett CityNC[place]hone: [phone] Fax:  [phone]: Kelsey KanzlerRN: [id number] of Birth: 3/Jul 12, 1945

## 2016-04-14 ENCOUNTER — Ambulatory Visit: Payer: Commercial Managed Care - HMO

## 2016-04-14 DIAGNOSIS — M5440 Lumbago with sciatica, unspecified side: Secondary | ICD-10-CM | POA: Diagnosis not present

## 2016-04-14 DIAGNOSIS — M6281 Muscle weakness (generalized): Secondary | ICD-10-CM

## 2016-04-14 NOTE — Therapy (Signed)
[place] MAIN [facility] SERVICES [address] [place], [place], [address] Phone: [phone]   Fax:  [phone]  Physical Therapy Treatment  Patient Details  Name: [name] MRN: [id number] Date of Birth: 1946/04/30 Referring Provider: Newman Pies  Encounter Date: 04/14/2016      PT End of Session - 04/14/16 1508    Visit Number 17   Number of Visits 21   Date for PT Re-Evaluation 2016-05-18   Authorization Type g codes   Authorization Time Period 7/10   PT Start Time 1430   PT Stop Time 1515   PT Time Calculation (min) 45 min   Activity Tolerance Patient limited by pain   Behavior During Therapy [facility] for tasks assessed/performed      Past Medical History:  Diagnosis Date  . Adenomatous colon polyp   . Arthritis   . Chronic kidney disease   . Diabetes mellitus without complication ([place])   . Dysuria   . Hypertension   . Osteopenia   . Sleep apnea   . Stroke ([place])   . Urine, incontinence, stress female     Past Surgical History:  Procedure Laterality Date  . COLONOSCOPY W/ POLYPECTOMY Right   . COLONOSCOPY WITH PROPOFOL N/A 01/09/2015   Procedure: COLONOSCOPY WITH PROPOFOL;  Surgeon: Manya Silvas, MD;  [facility];  Service: Endoscopy;  Laterality: N/A;  . JOINT REPLACEMENT    . LITHOTRIPSY    . ltk Left   . throidectomy      There were no vitals filed for this visit.      Subjective Assessment - 04/14/16 1449    Subjective Patient reports increase LBP today that started on Tuesday. Patient states no complaints otherwise.    Pertinent History CVA, diabetes, high blood pressure, kidney surgery, thyroid surgery, left knee replacement,   Limitations Walking;Standing   How long can you sit comfortably? a long time   How long can you stand comfortably? not very long   How long can you walk comfortably? not very far using a walker   Patient Stated Goals to have less pain and to walk more   Currently in Pain? Yes   Pain Score 4    Pain Location Back   Pain Orientation Lower   Pain Descriptors / Indicators Aching   Pain Type Chronic pain      Treatment Warm up on Nustep BLE only level 2 x5 min (Unbilled); Seat position at 7 Leg press at Quantum with cueing on knee and hip placement -  2x20 at 90# Manual LE traction B LE 2 x2 min Bilaterally to decrease Manual Hip IR/ER with patient hooklying B - 45 sec x 3  LTR with knees together - 3 min Knees to chest on physioball - 3 min Thoracic ball rolls outs in sitting with large physioball - 3 min         PT Education - 04/14/16 1505    Education provided Yes   Education Details Educated on exercise performance and technique   Person(s) Educated Patient   Methods Explanation;Demonstration   Comprehension Verbalized understanding;Returned demonstration             PT Long Term Goals - 04/07/16 1439      PT LONG TERM GOAL #1   Title Patient will be independent in home exercise program to improve strength/mobility for better functional independence with ADLs.   Time 8   Period Weeks   Status On-going  PT LONG TERM GOAL #2   Title Patient (> 57 years old) will complete five times sit to stand test in < 15 seconds indicating an increased LE strength and improved balance.   Baseline greater than 30 sec, 14.35 seconds with use of UEs on 03/08/16 04/07/16: 14 secs from raised seat   Time 8   Period Weeks   Status Partially Met     PT LONG TERM GOAL #3   Title Patient will increase six minute walk test distance to >1000 for progression to community ambulator and improve gait ability   Baseline 300 feet, 351f on 03/08/16 04/07/16: 4128f  Time 8   Period Weeks   Status Partially Met     PT LONG TERM GOAL #4   Title Patient will report a worst pain of 3/10 on VAS in low back   to improve tolerance with ADLs and reduced symptoms with activities.    Baseline 7/10, 4/10 on 03/08/16 04/07/16: 4/10   Time 8    Period Weeks     PT LONG TERM GOAL #5   Title  Patient will be able to perform household work/ chores without increase in symptoms.   Baseline 04/07/16: unable to perform most chores with washing the house but reports ability to was dishes and retrieve clothes for donation.   Time 8   Period Weeks   Status On-going               Plan - 04/14/16 1509    Clinical Impression Statement Focused on performing pain relieving motions as patient's pain was aggravated upon entering the clinic today. Patient demonstrates decreased pain after exercise performance indicating improved lumbar AROM and coordination. Patient continues to demonstrate decreased strength and endurance in weight bearing positions and will benefit from further skilled therapy to decrease fall risk.    Rehab Potential Fair   Clinical Impairments Affecting Rehab Potential DM, decreased balance   PT Frequency 2x / week   PT Duration 4 weeks   PT Treatment/Interventions Cryotherapy;Aquatic Therapy;Electrical Stimulation;Moist Heat;Traction;Ultrasound;Therapeutic activities;Therapeutic exercise;Balance training;Manual techniques;Gait training   PT Next Visit Plan Therapeutic exercise and manual therapy   PT Home Exercise Plan added intermittent walking    Consulted and Agree with Plan of Care Patient      Patient will benefit from skilled therapeutic intervention in order to improve the following deficits and impairments:  Decreased balance, Decreased mobility, Difficulty walking, Decreased range of motion, Decreased strength, Impaired flexibility, Pain, Obesity, Impaired sensation, Increased edema, Decreased activity tolerance  Visit Diagnosis: Muscle weakness (generalized)  Midline low back pain with sciatica, sciatica laterality unspecified, unspecified chronicity     Problem List There are no active problems to display for this patient.   WeBlythe StanfordPT DPT 04/14/2016, 3:17 PM  CoMicanopyAIN REHarbor Heights Surgery CenterERVICES [address] StreetdYorkNCAl[place]hone: [phone] Fax:  [phone]: PeDarryl BlumensteinRN: [id number] of Birth: 07/15/06/1947

## 2016-04-19 ENCOUNTER — Ambulatory Visit: Payer: Commercial Managed Care - HMO

## 2016-04-19 DIAGNOSIS — M48062 Spinal stenosis, lumbar region with neurogenic claudication: Secondary | ICD-10-CM | POA: Diagnosis not present

## 2016-04-19 DIAGNOSIS — M6281 Muscle weakness (generalized): Secondary | ICD-10-CM | POA: Diagnosis not present

## 2016-04-19 DIAGNOSIS — M5440 Lumbago with sciatica, unspecified side: Secondary | ICD-10-CM | POA: Diagnosis not present

## 2016-04-19 NOTE — Therapy (Signed)
[place] MAIN [facility] SERVICES [address] [place], [place], [address] Phone: [phone]   Fax:  [phone]  Physical Therapy Treatment  Patient Details  Name: [name] MRN: [id number] Date of Birth: May 16, 1945 Referring Provider: Newman Pies  Encounter Date: 04/19/2016      PT End of Session - 04/19/16 1456    Visit Number 18   Number of Visits 21   Date for PT Re-Evaluation 2016-05-01   Authorization Type g codes   Authorization Time Period 8/10   PT Start Time 1430   PT Stop Time 1515   PT Time Calculation (min) 45 min   Activity Tolerance Patient limited by pain   Behavior During Therapy [facility] for tasks assessed/performed      Past Medical History:  Diagnosis Date  . Adenomatous colon polyp   . Arthritis   . Chronic kidney disease   . Diabetes mellitus without complication ([place])   . Dysuria   . Hypertension   . Osteopenia   . Sleep apnea   . Stroke ([place])   . Urine, incontinence, stress female     Past Surgical History:  Procedure Laterality Date  . COLONOSCOPY W/ POLYPECTOMY Right   . COLONOSCOPY WITH PROPOFOL N/A 01/09/2015   Procedure: COLONOSCOPY WITH PROPOFOL;  Surgeon: Manya Silvas, MD;  Location: [facility] END[facility]COPY;  Service: Endoscopy;  Laterality: N/A;  . JOINT REPLACEMENT    . LITHOTRIPSY    . ltk Left   . throidectomy      There were no vitals filed for this visit.      Subjective Assessment - 04/19/16 1451    Subjective Patient reports increased LBP today which she states has been increased because the weather.    Pertinent History CVA, diabetes, high blood pressure, kidney surgery, thyroid surgery, left knee replacement,   Limitations Walking;Standing   How long can you sit comfortably? a long time   How long can you stand comfortably? not very long   How long can you walk comfortably? not very far using a walker   Patient Stated Goals to have less pain and to walk more   Currently  in Pain? Yes   Pain Score 4    Pain Location Back   Pain Orientation Lower   Pain Descriptors / Indicators Aching   Pain Type Chronic pain      Treatment Warm up on Nustep BLE only level 2 x3 min (Unbilled); Seat position at 7 Leg press at [employer] with cueing on knee and hip placement -  2x20 at 90#  Manual LE traction B LE 2 x2 min Bilaterally to decrease Manual Hip IR/ER with patient hooklying B - 45 sec x 3  LTR with knees together - 3 min Ball squeeze/glute squeeze in supine - x 25 Marches in supine with TrA activation -  Thoracic ball rolls outs in sitting with large physioball - 3 min         PT Education - 04/19/16 1454    Education provided Yes   Education Details Educated on form/technique throughout treatment session   Person(s) Educated Patient   Methods Explanation;Demonstration   Comprehension Verbalized understanding;Returned demonstration             PT Long Term Goals - 04/07/16 1439      PT LONG TERM GOAL #1   Title Patient will be independent in home exercise program to improve strength/mobility for better functional independence with ADLs.   Time 8  Period Weeks   Status On-going     PT LONG TERM GOAL #2   Title Patient (> 59 years old) will complete five times sit to stand test in < 15 seconds indicating an increased LE strength and improved balance.   Baseline greater than 30 sec, 14.35 seconds with use of UEs on 03/08/16 04/07/16: 14 secs from raised seat   Time 8   Period Weeks   Status Partially Met     PT LONG TERM GOAL #3   Title Patient will increase six minute walk test distance to >1000 for progression to community ambulator and improve gait ability   Baseline 300 feet, 366f on 03/08/16 04/07/16: 4165f  Time 8   Period Weeks   Status Partially Met     PT LONG TERM GOAL #4   Title Patient will report a worst pain of 3/10 on VAS in low back   to improve tolerance with ADLs and reduced symptoms with activities.    Baseline  7/10, 4/10 on 03/08/16 04/07/16: 4/10   Time 8   Period Weeks     PT LONG TERM GOAL #5   Title  Patient will be able to perform household work/ chores without increase in symptoms.   Baseline 04/07/16: unable to perform most chores with washing the house but reports ability to was dishes and retrieve clothes for donation.   Time 8   Period Weeks   Status On-going               Plan - 04/19/16 1456    Clinical Impression Statement Continued to focus on pain relieving movements and activities secondary to patient's increase in LBP. Patient demonstrates slight decrease in back pain after exercise performance, but patient continues to demonstrate increased back pain when transferring from supine to sitting. Talked with patient about discharge over next visit and patient confirms and agrees with POC.    Rehab Potential Fair   Clinical Impairments Affecting Rehab Potential DM, decreased balance   PT Frequency 2x / week   PT Duration 4 weeks   PT Treatment/Interventions Cryotherapy;Aquatic Therapy;Electrical Stimulation;Moist Heat;Traction;Ultrasound;Therapeutic activities;Therapeutic exercise;Balance training;Manual techniques;Gait training   PT Next Visit Plan Therapeutic exercise and manual therapy   PT Home Exercise Plan added intermittent walking    Consulted and Agree with Plan of Care Patient      Patient will benefit from skilled therapeutic intervention in order to improve the following deficits and impairments:  Decreased balance, Decreased mobility, Difficulty walking, Decreased range of motion, Decreased strength, Impaired flexibility, Pain, Obesity, Impaired sensation, Increased edema, Decreased activity tolerance  Visit Diagnosis: Muscle weakness (generalized)  Midline low back pain with sciatica, sciatica laterality unspecified, unspecified chronicity     Problem List There are no active problems to display for this patient.   WeBlythe Stanford2/03/2016, 3:40  PM  CoMonroviaAIN REGeisinger Encompass Health Rehabilitation HospitalERVICES [address]dHartshorneNC[place]hone: [phone] Fax:  [phone]: Kelsey DazeyRN: [id number] of Birth: 3/Sep 06, 1945

## 2016-04-21 ENCOUNTER — Ambulatory Visit: Payer: Commercial Managed Care - HMO

## 2016-04-21 DIAGNOSIS — M6281 Muscle weakness (generalized): Secondary | ICD-10-CM | POA: Diagnosis not present

## 2016-04-21 DIAGNOSIS — M5440 Lumbago with sciatica, unspecified side: Secondary | ICD-10-CM | POA: Diagnosis not present

## 2016-04-21 NOTE — Therapy (Signed)
[place] MAIN [facility] SERVICES [address] [place], [place], [address] Phone: [phone]   Fax:  [phone]  Physical Therapy Treatment/Discharge Summary  Patient Details  Name: [name] MRN: [id number] Date of Birth: 08/01/1945 Referring Provider: Newman Pies  Encounter Date: 04/21/2016  Pt has attended 19 out of 19 visits from 02/11/16 to 04/21/16. Pt has made functional improvements but continues to be limited in higher level functioning without increase in LBP. Patient is going to follow up with physician in January 2018 during visitation.       PT End of Session - 04/21/16 1536    Visit Number 19   Number of Visits 21   Date for PT Re-Evaluation 01-May-2016   Authorization Type g codes   Authorization Time Period 9/10   PT Start Time 1430   PT Stop Time 1515   PT Time Calculation (min) 45 min   Activity Tolerance Patient limited by pain   Behavior During Therapy [facility] for tasks assessed/performed      Past Medical History:  Diagnosis Date  . Adenomatous colon polyp   . Arthritis   . Chronic kidney disease   . Diabetes mellitus without complication ([place])   . Dysuria   . Hypertension   . Osteopenia   . Sleep apnea   . Stroke ([place])   . Urine, incontinence, stress female     Past Surgical History:  Procedure Laterality Date  . COLONOSCOPY W/ POLYPECTOMY Right   . COLONOSCOPY WITH PROPOFOL N/A 01/09/2015   Procedure: COLONOSCOPY WITH PROPOFOL;  Surgeon: Manya Silvas, MD;  Location: [facility] ENDOSCOPY;  Service: Endoscopy;  Laterality: N/A;  . JOINT REPLACEMENT    . LITHOTRIPSY    . ltk Left   . throidectomy      There were no vitals filed for this visit.      Subjective Assessment - 04/21/16 1439    Subjective Patient reports her LBP has much improved since the previous session. States she's prepared for discharge.    Pertinent History CVA, diabetes, high blood pressure, kidney surgery, thyroid surgery,  left knee replacement,   Limitations Walking;Standing   How long can you sit comfortably? a long time   How long can you stand comfortably? not very long   How long can you walk comfortably? not very far using a walker   Patient Stated Goals to have less pain and to walk more   Currently in Pain? No/denies      Treatment Warm up on Nustep BLE only level 2 x6 min (Unbilled); Seat position at 7 Manual LE traction B LE 2 x2 min Bilaterally to decrease Manual Hip IR/ER with patient hooklying B - 45 sec x 3  LTR with knees together - 3 min Ball squeeze/glute squeeze in supine - x 25 Russian twists in sitting - x 15 B Marches in sitting with TrA activation - x15 LAQ in sitting - x15 Thoracic ball rolls outs in sitting with large physioball - 3 min      PT Education - 04/21/16 1533    Education provided Yes   Education Details HEP: hip abd/add, bridges, walking program, seated twist, seated ball roll outs, marches, LTRs   Person(s) Educated Patient   Methods Explanation;Demonstration   Comprehension Verbalized understanding;Returned demonstration             PT Long Term Goals - 04/21/16 1545      PT LONG TERM GOAL #1   Title Patient will  be independent in home exercise program to improve strength/mobility for better functional independence with ADLs.   Baseline independent with HEP   Time 8   Period Weeks   Status Achieved     PT LONG TERM GOAL #2   Title Patient (> 71 years old) will complete five times sit to stand test in < 15 seconds indicating an increased LE strength and improved balance.   Baseline greater than 30 sec, 14.35 seconds with use of UEs on 03/08/16 04/07/16: 14 secs from raised seat   Time 8   Period Weeks   Status Achieved     PT LONG TERM GOAL #3   Title Patient will increase six minute walk test distance to >1000 for progression to community ambulator and improve gait ability   Baseline 300 feet, 328f on 03/08/16 04/07/16: 4149f  Time 8    Period Weeks   Status Partially Met     PT LONG TERM GOAL #4   Title Patient will report a worst pain of 3/10 on VAS in low back   to improve tolerance with ADLs and reduced symptoms with activities.    Baseline 7/10, 4/10 on 03/08/16 04/07/16: 4/10; 1212/17/20174/10   Time 8   Period Weeks   Status Partially Met     PT LONG TERM GOAL #5   Title  Patient will be able to perform household work/ chores without increase in symptoms.   Baseline 04/07/16: unable to perform most chores with washing the house but reports ability to was dishes and retrieve clothes for donation.   Time 8   Period Weeks   Status Partially Met               Plan - 122017/12/17538    Clinical Impression Statement Patient demonstrates decreased resting LBP today indicating functional carryover between treatment sessions. Focused on performing exercises for HEP at home and patient demonstrates independence with exercise performance. Patient reports she is prepared for discharge from therapy and is going to follow up with physician about other interventions to assist with LBP in January.    Rehab Potential Fair   Clinical Impairments Affecting Rehab Potential DM, decreased balance   PT Frequency 2x / week   PT Duration 4 weeks   PT Treatment/Interventions Cryotherapy;Aquatic Therapy;Electrical Stimulation;Moist Heat;Traction;Ultrasound;Therapeutic activities;Therapeutic exercise;Balance training;Manual techniques;Gait training   PT Next Visit Plan Therapeutic exercise and manual therapy   PT Home Exercise Plan added intermittent walking    Consulted and Agree with Plan of Care Patient      Patient will benefit from skilled therapeutic intervention in order to improve the following deficits and impairments:  Decreased balance, Decreased mobility, Difficulty walking, Decreased range of motion, Decreased strength, Impaired flexibility, Pain, Obesity, Impaired sensation, Increased edema, Decreased activity  tolerance  Visit Diagnosis: Muscle weakness (generalized)  Midline low back pain with sciatica, sciatica laterality unspecified, unspecified chronicity       G-Codes - 122017/12/17552    Functional Assessment Tool Used tug, 10 MW, 6 mw, 5 x sit to stand, clinical judgement   Functional Limitation Mobility: Walking and moving around   Mobility: Walking and Moving Around Current Status ([id number] least 20 percent but less than 40 percent impaired, limited or restricted   Mobility: Walking and Moving Around Goal Status ([id number] least 20 percent but less than 40 percent impaired, limited or restricted   Mobility: Walking and Moving Around Discharge Status ([phone] least 20 percent but less than 40 percent  impaired, limited or restricted      Problem List There are no active problems to display for this patient.   Blythe Stanford, PT DPT 04/21/2016, 3:53 PM  [place] MAIN [facility] SERVICES [address] [place], [place], [address] Phone: [phone]   Fax:  [phone]  Name: Jamyiah Labella MRN: [id number] Date of Birth: 1946/03/16

## 2016-05-25 DIAGNOSIS — Z6833 Body mass index (BMI) 33.0-33.9, adult: Secondary | ICD-10-CM | POA: Diagnosis not present

## 2016-05-25 DIAGNOSIS — M4125 Other idiopathic scoliosis, thoracolumbar region: Secondary | ICD-10-CM | POA: Diagnosis not present

## 2016-05-25 DIAGNOSIS — I10 Essential (primary) hypertension: Secondary | ICD-10-CM | POA: Diagnosis not present

## 2016-06-28 DIAGNOSIS — M1000 Idiopathic gout, unspecified site: Secondary | ICD-10-CM | POA: Diagnosis not present

## 2016-06-28 DIAGNOSIS — Z79899 Other long term (current) drug therapy: Secondary | ICD-10-CM | POA: Diagnosis not present

## 2016-06-28 DIAGNOSIS — E118 Type 2 diabetes mellitus with unspecified complications: Secondary | ICD-10-CM | POA: Diagnosis not present

## 2016-06-30 ENCOUNTER — Other Ambulatory Visit: Payer: Self-pay | Admitting: Neurosurgery

## 2016-06-30 DIAGNOSIS — M4125 Other idiopathic scoliosis, thoracolumbar region: Secondary | ICD-10-CM

## 2016-07-05 ENCOUNTER — Other Ambulatory Visit: Payer: Self-pay | Admitting: Internal Medicine

## 2016-07-05 DIAGNOSIS — E118 Type 2 diabetes mellitus with unspecified complications: Secondary | ICD-10-CM | POA: Diagnosis not present

## 2016-07-05 DIAGNOSIS — I10 Essential (primary) hypertension: Secondary | ICD-10-CM | POA: Insufficient documentation

## 2016-07-05 DIAGNOSIS — Z0000 Encounter for general adult medical examination without abnormal findings: Secondary | ICD-10-CM | POA: Diagnosis not present

## 2016-07-05 DIAGNOSIS — G4733 Obstructive sleep apnea (adult) (pediatric): Secondary | ICD-10-CM | POA: Diagnosis not present

## 2016-07-05 DIAGNOSIS — N184 Chronic kidney disease, stage 4 (severe): Secondary | ICD-10-CM | POA: Diagnosis not present

## 2016-07-05 DIAGNOSIS — M48062 Spinal stenosis, lumbar region with neurogenic claudication: Secondary | ICD-10-CM | POA: Diagnosis not present

## 2016-07-05 DIAGNOSIS — Z8673 Personal history of transient ischemic attack (TIA), and cerebral infarction without residual deficits: Secondary | ICD-10-CM | POA: Insufficient documentation

## 2016-07-05 DIAGNOSIS — Z1231 Encounter for screening mammogram for malignant neoplasm of breast: Secondary | ICD-10-CM

## 2016-07-05 DIAGNOSIS — Z9989 Dependence on other enabling machines and devices: Secondary | ICD-10-CM | POA: Diagnosis not present

## 2016-07-09 ENCOUNTER — Ambulatory Visit
Admission: RE | Admit: 2016-07-09 | Discharge: 2016-07-09 | Disposition: A | Discharge status: Home / Self Care | Payer: Medicare HMO | Source: Ambulatory Visit | Attending: Neurosurgery | Admitting: Neurosurgery

## 2016-07-09 DIAGNOSIS — M4125 Other idiopathic scoliosis, thoracolumbar region: Secondary | ICD-10-CM

## 2016-07-09 DIAGNOSIS — N200 Calculus of kidney: Secondary | ICD-10-CM | POA: Insufficient documentation

## 2016-07-09 DIAGNOSIS — M1990 Unspecified osteoarthritis, unspecified site: Secondary | ICD-10-CM | POA: Insufficient documentation

## 2016-07-09 DIAGNOSIS — M48061 Spinal stenosis, lumbar region without neurogenic claudication: Secondary | ICD-10-CM | POA: Diagnosis not present

## 2016-07-09 MED ORDER — IOPAMIDOL (ISOVUE-M 200) INJECTION 41%
15.0000 mL | Freq: Once | INTRAMUSCULAR | Status: AC
  Administered 2016-07-09: 15 mL via INTRATHECAL

## 2016-07-09 MED ORDER — OXYCODONE-ACETAMINOPHEN 5-325 MG PO TABS
1.0000 | ORAL_TABLET | Freq: Once | ORAL | Status: AC
Start: 2016-07-09 — End: 2016-07-09
  Administered 2016-07-09: 1 via ORAL

## 2016-07-09 MED ORDER — DIAZEPAM 5 MG PO TABS
5.0000 mg | ORAL_TABLET | Freq: Once | ORAL | Status: AC
  Administered 2016-07-09: 5 mg via ORAL

## 2016-07-09 NOTE — Discharge Instructions (Signed)

## 2016-07-12 ENCOUNTER — Telehealth: Payer: Self-pay | Admitting: Radiology

## 2016-07-26 DIAGNOSIS — M48062 Spinal stenosis, lumbar region with neurogenic claudication: Secondary | ICD-10-CM | POA: Diagnosis not present

## 2016-07-26 DIAGNOSIS — Z6834 Body mass index (BMI) 34.0-34.9, adult: Secondary | ICD-10-CM | POA: Diagnosis not present

## 2016-07-26 DIAGNOSIS — I10 Essential (primary) hypertension: Secondary | ICD-10-CM | POA: Diagnosis not present

## 2016-07-28 ENCOUNTER — Other Ambulatory Visit: Payer: Self-pay | Admitting: Neurosurgery

## 2016-07-30 ENCOUNTER — Ambulatory Visit
Admission: RE | Admit: 2016-07-30 | Discharge: 2016-07-30 | Disposition: A | Discharge status: Home / Self Care | Payer: Medicare HMO | Source: Ambulatory Visit | Attending: Internal Medicine | Admitting: Internal Medicine

## 2016-07-30 DIAGNOSIS — Z1231 Encounter for screening mammogram for malignant neoplasm of breast: Secondary | ICD-10-CM

## 2016-08-30 ENCOUNTER — Encounter (HOSPITAL_COMMUNITY)
Admission: RE | Admit: 2016-08-30 | Discharge: 2016-08-30 | Disposition: A | Discharge status: Home / Self Care | Payer: Medicare HMO | Source: Ambulatory Visit | Attending: Neurosurgery | Admitting: Neurosurgery

## 2016-08-30 ENCOUNTER — Encounter (HOSPITAL_COMMUNITY): Payer: Self-pay

## 2016-08-30 DIAGNOSIS — M48062 Spinal stenosis, lumbar region with neurogenic claudication: Secondary | ICD-10-CM | POA: Insufficient documentation

## 2016-08-30 DIAGNOSIS — Z01812 Encounter for preprocedural laboratory examination: Secondary | ICD-10-CM | POA: Diagnosis not present

## 2016-08-30 DIAGNOSIS — Z01818 Encounter for other preprocedural examination: Secondary | ICD-10-CM | POA: Insufficient documentation

## 2016-08-30 HISTORY — DX: Nausea with vomiting, unspecified: R11.2

## 2016-08-30 HISTORY — DX: Unspecified atrial fibrillation: I48.91

## 2016-08-30 HISTORY — DX: Other specified postprocedural states: Z98.890

## 2016-08-30 HISTORY — DX: Personal history of urinary calculi: Z87.442

## 2016-08-30 HISTORY — DX: Other complications of anesthesia, initial encounter: T88.59XA

## 2016-08-30 HISTORY — DX: Adverse effect of unspecified anesthetic, initial encounter: T41.45XA

## 2016-08-30 LAB — CBC
HEMATOCRIT: 42.2 % (ref 36.0–46.0)
Hemoglobin: 14.2 g/dL (ref 12.0–15.0)
MCH: 29.8 pg (ref 26.0–34.0)
MCHC: 33.6 g/dL (ref 30.0–36.0)
MCV: 88.5 fL (ref 78.0–100.0)
Platelets: 243 10*3/uL (ref 150–400)
RBC: 4.77 MIL/uL (ref 3.87–5.11)
RDW: 13.4 % (ref 11.5–15.5)
WBC: 8.1 10*3/uL (ref 4.0–10.5)

## 2016-08-30 LAB — BASIC METABOLIC PANEL
Anion gap: 9 (ref 5–15)
BUN: 18 mg/dL (ref 6–20)
CALCIUM: 9.7 mg/dL (ref 8.9–10.3)
CHLORIDE: 104 mmol/L (ref 101–111)
CO2: 26 mmol/L (ref 22–32)
CREATININE: 1.58 mg/dL — AB (ref 0.44–1.00)
GFR calc non Af Amer: 32 mL/min — ABNORMAL LOW (ref >60)
GFR, EST AFRICAN AMERICAN: 37 mL/min — AB (ref >60)
Glucose, Bld: 155 mg/dL — ABNORMAL HIGH (ref 65–99)
Potassium: 4.2 mmol/L (ref 3.5–5.1)
Sodium: 139 mmol/L (ref 135–145)

## 2016-08-30 LAB — SURGICAL PCR SCREEN
MRSA, PCR: NEGATIVE
STAPHYLOCOCCUS AUREUS: NEGATIVE

## 2016-08-30 LAB — GLUCOSE, CAPILLARY: Glucose-Capillary: 139 mg/dL — ABNORMAL HIGH (ref 65–99)

## 2016-08-30 NOTE — Progress Notes (Addendum)
PCP is Dr. Emily Filbert @ [facility].  LOV 07/2016 Feb. 2018 A1C was 6.5 She states that she occasionally checks her sugars, and when she does it averages  Between 110-118.   Stopped her basa 4/23 Patient has long history of kidney stones.  40 yrs ago or more, while she was pregnant, "they had to cut me in 1/2 to get to the stone" Back in 2003, she had a mini stroke affecting her right side.  She could still talk, but was confused.  Everything came back, but as of today, her right side is still alittle weak. Back in 2013, they were placing a right kidney stent in, she states she was told that she had gone into a-fib.  Seems it corrected itself, and then had  litho to extract the stone, (has CKD per notes)  Sees Dr. Edrick Oh At present, denies any cardiac issues, sees no cardiologist now.  She did mention, that just recently, she's had some ankle swelling, but attributes it to not have the correct recliner.  The new one does not go all the way back.

## 2016-08-30 NOTE — Pre-Procedure Instructions (Signed)
[name]  08/30/2016      [name] [name], [place] - [name] [address] Phone: [phone] Fax: [phone]    Your procedure is scheduled on Monday, April 30th   Report to [facility] Admitting at 12:30 PM.             (posted surgery time 2:28 - 4:47 pm)   Call this number if you have problems the [facility] of surgery:  [phone], other generalized questions [phone] Mon - Fri from 8-4:30pm   Remember:  Do not eat food or drink liquids after midnight Sunday.   Take these medicines the morning of surgery with A SIP OF WATER : Norvasc, Metoprolol, Oxycodone.              4-5 days prior to surgery, STOP taking any vitamins, herbal supplements, anti-inflammatories.   Do not wear jewelry, make-up or nail polish.  Do not wear lotions, powders, perfumes, or deoderant.  Do not shave 48 hours prior to surgery.     Do not bring valuables to the hospital.  [facility] is not responsible for any belongings or valuables.  Contacts, dentures or bridgework may not be worn into surgery.  Leave your suitcase in the car.  After surgery it may be brought to your room.  For patients admitted to the hospital, discharge time will be determined by your treatment team.  Please read over the following fact sheets that you were given. Pain Booklet, MRSA Information and Surgical Site Infection Prevention                How to Manage Your Diabetes Before and After Surgery  Why is it important to control my blood sugar before and after surgery? . Improving blood sugar levels before and after surgery helps healing and can limit problems. . A way of improving blood sugar control is eating a healthy diet by: o  Eating less sugar and carbohydrates o  Increasing activity/exercise o  Talking with your doctor about reaching your blood sugar goals . High blood sugars (greater than 180 mg/dL) can raise your  risk of infections and slow your recovery, so you will need to focus on controlling your diabetes during the weeks before surgery. . Make sure that the doctor who takes care of your diabetes knows about your planned surgery including the date and location.  How do I manage my blood sugar before surgery? . Check your blood sugar at least 4 times a day, starting 2 days before surgery, to make sure that the level is not too high or low. o Check your blood sugar the morning of your surgery when you wake up and every 2 hours until you get to the Short Stay unit. o  . If your blood sugar is less than 70 mg/dL, you will need to treat for low blood sugar: o Do not take insulin. o  o Treat a low blood sugar (less than 70 mg/dL) with  cup of clear juice (cranberry or apple), 4 glucose tablets, OR glucose gel. o  o Recheck blood sugar in 15 minutes after treatment (to make sure it is greater than 70 mg/dL). If your blood sugar is not greater than 70 mg/dL on recheck, call [phone] for further instructions. . Report your blood sugar to the short stay nurse when you get to Short Stay.  . If you are admitted to the hospital after surgery: o Your blood  sugar will be checked by the staff and you will probably be given insulin after surgery (instead of oral diabetes medicines) to make sure you have good blood sugar levels. o The goal for blood sugar control after surgery is 80-180 mg/dL.   WHAT DO I DO ABOUT MY DIABETES MEDICATION?   Marland Kitchen Do not take oral diabetes medicines (pills) the morning of surgery.    Patient Signature:  Date:   Nurse Signature:  Date:   Reviewed and Endorsed by [facility] Patient Education Committee, August 2015

## 2016-09-06 ENCOUNTER — Ambulatory Visit (HOSPITAL_COMMUNITY): Payer: Medicare HMO | Admitting: Certified Registered Nurse Anesthetist

## 2016-09-06 ENCOUNTER — Encounter (HOSPITAL_COMMUNITY)
Admission: RE | Disposition: A | Discharge status: Home Health | Payer: Self-pay | Source: Ambulatory Visit | Attending: Neurosurgery

## 2016-09-06 ENCOUNTER — Encounter (HOSPITAL_COMMUNITY): Payer: Self-pay | Admitting: Urology

## 2016-09-06 ENCOUNTER — Ambulatory Visit (HOSPITAL_COMMUNITY)
Admission: RE | Admit: 2016-09-06 | Discharge: 2016-09-08 | Disposition: A | Discharge status: Home Health | Payer: Medicare HMO | Source: Ambulatory Visit | Attending: Neurosurgery | Admitting: Neurosurgery

## 2016-09-06 ENCOUNTER — Ambulatory Visit (HOSPITAL_COMMUNITY): Payer: Medicare HMO

## 2016-09-06 DIAGNOSIS — Z885 Allergy status to narcotic agent status: Secondary | ICD-10-CM | POA: Diagnosis not present

## 2016-09-06 DIAGNOSIS — N189 Chronic kidney disease, unspecified: Secondary | ICD-10-CM | POA: Insufficient documentation

## 2016-09-06 DIAGNOSIS — Z7984 Long term (current) use of oral hypoglycemic drugs: Secondary | ICD-10-CM | POA: Diagnosis not present

## 2016-09-06 DIAGNOSIS — M5416 Radiculopathy, lumbar region: Secondary | ICD-10-CM | POA: Insufficient documentation

## 2016-09-06 DIAGNOSIS — Z87442 Personal history of urinary calculi: Secondary | ICD-10-CM | POA: Diagnosis not present

## 2016-09-06 DIAGNOSIS — E1122 Type 2 diabetes mellitus with diabetic chronic kidney disease: Secondary | ICD-10-CM | POA: Insufficient documentation

## 2016-09-06 DIAGNOSIS — M419 Scoliosis, unspecified: Secondary | ICD-10-CM | POA: Insufficient documentation

## 2016-09-06 DIAGNOSIS — Z79891 Long term (current) use of opiate analgesic: Secondary | ICD-10-CM | POA: Insufficient documentation

## 2016-09-06 DIAGNOSIS — M1990 Unspecified osteoarthritis, unspecified site: Secondary | ICD-10-CM | POA: Diagnosis not present

## 2016-09-06 DIAGNOSIS — I129 Hypertensive chronic kidney disease with stage 1 through stage 4 chronic kidney disease, or unspecified chronic kidney disease: Secondary | ICD-10-CM | POA: Diagnosis not present

## 2016-09-06 DIAGNOSIS — Z4889 Encounter for other specified surgical aftercare: Secondary | ICD-10-CM | POA: Diagnosis not present

## 2016-09-06 DIAGNOSIS — Z79899 Other long term (current) drug therapy: Secondary | ICD-10-CM | POA: Insufficient documentation

## 2016-09-06 DIAGNOSIS — M48062 Spinal stenosis, lumbar region with neurogenic claudication: Secondary | ICD-10-CM | POA: Diagnosis present

## 2016-09-06 DIAGNOSIS — M6281 Muscle weakness (generalized): Secondary | ICD-10-CM | POA: Diagnosis not present

## 2016-09-06 DIAGNOSIS — M4186 Other forms of scoliosis, lumbar region: Secondary | ICD-10-CM | POA: Diagnosis not present

## 2016-09-06 DIAGNOSIS — E119 Type 2 diabetes mellitus without complications: Secondary | ICD-10-CM | POA: Diagnosis not present

## 2016-09-06 DIAGNOSIS — R262 Difficulty in walking, not elsewhere classified: Secondary | ICD-10-CM | POA: Diagnosis not present

## 2016-09-06 DIAGNOSIS — I10 Essential (primary) hypertension: Secondary | ICD-10-CM | POA: Diagnosis not present

## 2016-09-06 DIAGNOSIS — Z7982 Long term (current) use of aspirin: Secondary | ICD-10-CM | POA: Diagnosis not present

## 2016-09-06 DIAGNOSIS — Z8673 Personal history of transient ischemic attack (TIA), and cerebral infarction without residual deficits: Secondary | ICD-10-CM | POA: Diagnosis not present

## 2016-09-06 DIAGNOSIS — N200 Calculus of kidney: Secondary | ICD-10-CM | POA: Diagnosis not present

## 2016-09-06 DIAGNOSIS — Z419 Encounter for procedure for purposes other than remedying health state, unspecified: Secondary | ICD-10-CM

## 2016-09-06 HISTORY — PX: LUMBAR LAMINECTOMY/DECOMPRESSION MICRODISCECTOMY: SHX5026

## 2016-09-06 LAB — GLUCOSE, CAPILLARY
GLUCOSE-CAPILLARY: 103 mg/dL — AB (ref 65–99)
GLUCOSE-CAPILLARY: 233 mg/dL — AB (ref 65–99)
Glucose-Capillary: 105 mg/dL — ABNORMAL HIGH (ref 65–99)

## 2016-09-06 SURGERY — LUMBAR LAMINECTOMY/DECOMPRESSION MICRODISCECTOMY 2 LEVELS
Anesthesia: General

## 2016-09-06 MED ORDER — MENTHOL 3 MG MT LOZG: 1.0000 | LOZENGE | OROMUCOSAL | Status: DC | PRN

## 2016-09-06 MED ORDER — ACETAMINOPHEN 325 MG PO TABS: 650.0000 mg | ORAL_TABLET | ORAL | Status: DC | PRN

## 2016-09-06 MED ORDER — SUGAMMADEX SODIUM 200 MG/2ML IV SOLN
INTRAVENOUS | Status: DC | PRN
  Administered 2016-09-06: 200 mg via INTRAVENOUS

## 2016-09-06 MED ORDER — THROMBIN 5000 UNITS EX SOLR
CUTANEOUS | Status: DC | PRN
  Administered 2016-09-06: 10000 [IU] via TOPICAL

## 2016-09-06 MED ORDER — OXYCODONE HCL 5 MG PO TABS
5.0000 mg | ORAL_TABLET | ORAL | Status: DC | PRN
  Administered 2016-09-06 – 2016-09-07 (×3): 10 mg via ORAL
  Administered 2016-09-07: 5 mg via ORAL
  Filled 2016-09-06 (×3): qty 2
  Filled 2016-09-06: qty 1

## 2016-09-06 MED ORDER — BUPIVACAINE LIPOSOME 1.3 % IJ SUSP
INTRAMUSCULAR | Status: DC | PRN
  Administered 2016-09-06: 20 mL

## 2016-09-06 MED ORDER — CEFAZOLIN SODIUM-DEXTROSE 2-4 GM/100ML-% IV SOLN
2.0000 g | Freq: Three times a day (TID) | INTRAVENOUS | Status: AC
Start: 2016-09-06 — End: 2016-09-07
  Administered 2016-09-06 – 2016-09-07 (×2): 2 g via INTRAVENOUS
  Filled 2016-09-06 (×2): qty 100

## 2016-09-06 MED ORDER — SCOPOLAMINE 1 MG/3DAYS TD PT72
MEDICATED_PATCH | TRANSDERMAL | Status: AC
  Filled 2016-09-06: qty 1

## 2016-09-06 MED ORDER — SODIUM CHLORIDE 0.9% FLUSH: 3.0000 mL | Freq: Two times a day (BID) | INTRAVENOUS | Status: DC

## 2016-09-06 MED ORDER — ONDANSETRON HCL 4 MG/2ML IJ SOLN
INTRAMUSCULAR | Status: DC | PRN
  Administered 2016-09-06: 4 mg via INTRAVENOUS

## 2016-09-06 MED ORDER — LACTATED RINGERS IV SOLN
INTRAVENOUS | Status: DC
  Administered 2016-09-06 (×3): via INTRAVENOUS

## 2016-09-06 MED ORDER — METFORMIN HCL 500 MG PO TABS
1000.0000 mg | ORAL_TABLET | Freq: Two times a day (BID) | ORAL | Status: DC
  Administered 2016-09-07 – 2016-09-08 (×3): 1000 mg via ORAL
  Filled 2016-09-06 (×3): qty 2

## 2016-09-06 MED ORDER — INSULIN ASPART 100 UNIT/ML ~~LOC~~ SOLN
0.0000 [IU] | Freq: Three times a day (TID) | SUBCUTANEOUS | Status: DC
  Administered 2016-09-07: 5 [IU] via SUBCUTANEOUS
  Administered 2016-09-07: 3 [IU] via SUBCUTANEOUS
  Administered 2016-09-07: 2 [IU] via SUBCUTANEOUS

## 2016-09-06 MED ORDER — SODIUM CHLORIDE 0.9 % IR SOLN
Status: DC | PRN
  Administered 2016-09-06: 14:00:00

## 2016-09-06 MED ORDER — BUPIVACAINE-EPINEPHRINE (PF) 0.5% -1:200000 IJ SOLN
INTRAMUSCULAR | Status: DC | PRN
  Administered 2016-09-06: 10 mL via PERINEURAL

## 2016-09-06 MED ORDER — ONDANSETRON HCL 4 MG/2ML IJ SOLN
INTRAMUSCULAR | Status: AC
  Filled 2016-09-06: qty 2

## 2016-09-06 MED ORDER — PROPOFOL 10 MG/ML IV BOLUS
INTRAVENOUS | Status: DC | PRN
  Administered 2016-09-06: 150 mg via INTRAVENOUS

## 2016-09-06 MED ORDER — PROPOFOL 10 MG/ML IV BOLUS
INTRAVENOUS | Status: AC
  Filled 2016-09-06: qty 20

## 2016-09-06 MED ORDER — BUPIVACAINE-EPINEPHRINE (PF) 0.5% -1:200000 IJ SOLN
INTRAMUSCULAR | Status: AC
  Filled 2016-09-06: qty 30

## 2016-09-06 MED ORDER — CHLORHEXIDINE GLUCONATE CLOTH 2 % EX PADS: 6.0000 | MEDICATED_PAD | Freq: Once | CUTANEOUS | Status: DC

## 2016-09-06 MED ORDER — SODIUM CHLORIDE 0.9 % IV SOLN: 250.0000 mL | INTRAVENOUS | Status: DC

## 2016-09-06 MED ORDER — DEXAMETHASONE SODIUM PHOSPHATE 10 MG/ML IJ SOLN
INTRAMUSCULAR | Status: DC | PRN
  Administered 2016-09-06: 10 mg via INTRAVENOUS

## 2016-09-06 MED ORDER — PHENYLEPHRINE HCL 10 MG/ML IJ SOLN
INTRAMUSCULAR | Status: DC | PRN
  Administered 2016-09-06 (×2): 80 ug via INTRAVENOUS

## 2016-09-06 MED ORDER — HYDROMORPHONE HCL 1 MG/ML IJ SOLN
0.2500 mg | INTRAMUSCULAR | Status: DC | PRN
  Administered 2016-09-06: 0.5 mg via INTRAVENOUS

## 2016-09-06 MED ORDER — LIDOCAINE 2% (20 MG/ML) 5 ML SYRINGE
INTRAMUSCULAR | Status: AC
  Filled 2016-09-06: qty 5

## 2016-09-06 MED ORDER — BACITRACIN ZINC 500 UNIT/GM EX OINT
TOPICAL_OINTMENT | CUTANEOUS | Status: DC | PRN
  Administered 2016-09-06: 1 via TOPICAL

## 2016-09-06 MED ORDER — ROCURONIUM BROMIDE 10 MG/ML (PF) SYRINGE
PREFILLED_SYRINGE | INTRAVENOUS | Status: AC
  Filled 2016-09-06: qty 5

## 2016-09-06 MED ORDER — FENTANYL CITRATE (PF) 100 MCG/2ML IJ SOLN
INTRAMUSCULAR | Status: DC | PRN
  Administered 2016-09-06: 100 ug via INTRAVENOUS
  Administered 2016-09-06 (×3): 50 ug via INTRAVENOUS

## 2016-09-06 MED ORDER — ACETAMINOPHEN 650 MG RE SUPP: 650.0000 mg | RECTAL | Status: DC | PRN

## 2016-09-06 MED ORDER — HYDROMORPHONE HCL 1 MG/ML IJ SOLN
INTRAMUSCULAR | Status: AC
  Filled 2016-09-06: qty 0.5

## 2016-09-06 MED ORDER — SCOPOLAMINE 1 MG/3DAYS TD PT72
MEDICATED_PATCH | TRANSDERMAL | Status: DC | PRN
  Administered 2016-09-06: 1 via TRANSDERMAL

## 2016-09-06 MED ORDER — FENTANYL CITRATE (PF) 250 MCG/5ML IJ SOLN
INTRAMUSCULAR | Status: AC
  Filled 2016-09-06: qty 5

## 2016-09-06 MED ORDER — BACITRACIN ZINC 500 UNIT/GM EX OINT
TOPICAL_OINTMENT | CUTANEOUS | Status: AC
  Filled 2016-09-06: qty 28.35

## 2016-09-06 MED ORDER — INSULIN ASPART 100 UNIT/ML ~~LOC~~ SOLN
0.0000 [IU] | Freq: Every day | SUBCUTANEOUS | Status: DC
  Administered 2016-09-06: 2 [IU] via SUBCUTANEOUS

## 2016-09-06 MED ORDER — SUGAMMADEX SODIUM 200 MG/2ML IV SOLN
INTRAVENOUS | Status: AC
  Filled 2016-09-06: qty 2

## 2016-09-06 MED ORDER — ONDANSETRON HCL 4 MG/2ML IJ SOLN: 4.0000 mg | Freq: Four times a day (QID) | INTRAMUSCULAR | Status: DC | PRN

## 2016-09-06 MED ORDER — ACETAMINOPHEN 10 MG/ML IV SOLN
INTRAVENOUS | Status: DC | PRN
  Administered 2016-09-06: 1000 mg via INTRAVENOUS

## 2016-09-06 MED ORDER — BISACODYL 10 MG RE SUPP: 10.0000 mg | Freq: Every day | RECTAL | Status: DC | PRN

## 2016-09-06 MED ORDER — DEXAMETHASONE SODIUM PHOSPHATE 10 MG/ML IJ SOLN
INTRAMUSCULAR | Status: AC
  Filled 2016-09-06: qty 1

## 2016-09-06 MED ORDER — SODIUM CHLORIDE 0.9% FLUSH: 3.0000 mL | INTRAVENOUS | Status: DC | PRN

## 2016-09-06 MED ORDER — BUPIVACAINE LIPOSOME 1.3 % IJ SUSP
20.0000 mL | INTRAMUSCULAR | Status: DC
  Filled 2016-09-06: qty 20

## 2016-09-06 MED ORDER — ACETAMINOPHEN 10 MG/ML IV SOLN
INTRAVENOUS | Status: AC
  Filled 2016-09-06: qty 100

## 2016-09-06 MED ORDER — AMLODIPINE BESYLATE 10 MG PO TABS
10.0000 mg | ORAL_TABLET | Freq: Every day | ORAL | Status: DC
  Administered 2016-09-07 – 2016-09-08 (×2): 10 mg via ORAL
  Filled 2016-09-06 (×2): qty 1

## 2016-09-06 MED ORDER — PROMETHAZINE HCL 25 MG/ML IJ SOLN
6.2500 mg | INTRAMUSCULAR | Status: DC | PRN
Start: 2016-09-06 — End: 2016-09-06

## 2016-09-06 MED ORDER — HYDROMORPHONE HCL 1 MG/ML IJ SOLN: 1.0000 mg | INTRAMUSCULAR | Status: DC | PRN

## 2016-09-06 MED ORDER — METOPROLOL TARTRATE 100 MG PO TABS
100.0000 mg | ORAL_TABLET | Freq: Every day | ORAL | Status: DC
  Administered 2016-09-07 – 2016-09-08 (×2): 100 mg via ORAL
  Filled 2016-09-06 (×2): qty 1

## 2016-09-06 MED ORDER — CEFAZOLIN SODIUM-DEXTROSE 2-4 GM/100ML-% IV SOLN
2.0000 g | INTRAVENOUS | Status: AC
  Administered 2016-09-06: 2 g via INTRAVENOUS
  Filled 2016-09-06: qty 100

## 2016-09-06 MED ORDER — POLYETHYL GLYCOL-PROPYL GLYCOL 0.4-0.3 % OP GEL
Freq: Every day | OPHTHALMIC | Status: DC | PRN
  Filled 2016-09-06: qty 10

## 2016-09-06 MED ORDER — LIDOCAINE HCL (CARDIAC) 20 MG/ML IV SOLN
INTRAVENOUS | Status: DC | PRN
  Administered 2016-09-06: 80 mg via INTRAVENOUS

## 2016-09-06 MED ORDER — THROMBIN 5000 UNITS EX SOLR
OROMUCOSAL | Status: DC | PRN
  Administered 2016-09-06: 13:00:00 via TOPICAL

## 2016-09-06 MED ORDER — ZOLPIDEM TARTRATE 5 MG PO TABS: 5.0000 mg | ORAL_TABLET | Freq: Every evening | ORAL | Status: DC | PRN

## 2016-09-06 MED ORDER — THROMBIN 5000 UNITS EX SOLR
CUTANEOUS | Status: AC
  Filled 2016-09-06: qty 15000

## 2016-09-06 MED ORDER — GABAPENTIN 100 MG PO CAPS
200.0000 mg | ORAL_CAPSULE | Freq: Every day | ORAL | Status: DC
  Administered 2016-09-06 – 2016-09-07 (×2): 200 mg via ORAL
  Filled 2016-09-06 (×2): qty 2

## 2016-09-06 MED ORDER — HEMOSTATIC AGENTS (NO CHARGE) OPTIME
TOPICAL | Status: DC | PRN
  Administered 2016-09-06: 1 via TOPICAL

## 2016-09-06 MED ORDER — 0.9 % SODIUM CHLORIDE (POUR BTL) OPTIME
TOPICAL | Status: DC | PRN
  Administered 2016-09-06: 1000 mL

## 2016-09-06 MED ORDER — ONDANSETRON HCL 4 MG PO TABS: 4.0000 mg | ORAL_TABLET | Freq: Four times a day (QID) | ORAL | Status: DC | PRN

## 2016-09-06 MED ORDER — ROCURONIUM BROMIDE 100 MG/10ML IV SOLN
INTRAVENOUS | Status: DC | PRN
  Administered 2016-09-06: 60 mg via INTRAVENOUS

## 2016-09-06 MED ORDER — CYCLOBENZAPRINE HCL 10 MG PO TABS
10.0000 mg | ORAL_TABLET | Freq: Three times a day (TID) | ORAL | Status: DC | PRN
  Administered 2016-09-06: 10 mg via ORAL
  Filled 2016-09-06: qty 1

## 2016-09-06 MED ORDER — DOCUSATE SODIUM 100 MG PO CAPS
100.0000 mg | ORAL_CAPSULE | Freq: Two times a day (BID) | ORAL | Status: DC
  Administered 2016-09-06 – 2016-09-08 (×4): 100 mg via ORAL
  Filled 2016-09-06 (×4): qty 1

## 2016-09-06 MED ORDER — PHENOL 1.4 % MT LIQD: 1.0000 | OROMUCOSAL | Status: DC | PRN

## 2016-09-06 MED ORDER — SIMVASTATIN 20 MG PO TABS
20.0000 mg | ORAL_TABLET | Freq: Every day | ORAL | Status: DC
  Administered 2016-09-06 – 2016-09-07 (×2): 20 mg via ORAL
  Filled 2016-09-06 (×2): qty 1

## 2016-09-06 MED ORDER — ALLOPURINOL 100 MG PO TABS
100.0000 mg | ORAL_TABLET | Freq: Every day | ORAL | Status: DC
  Administered 2016-09-07 – 2016-09-08 (×2): 100 mg via ORAL
  Filled 2016-09-06 (×2): qty 1

## 2016-09-06 SURGICAL SUPPLY — 50 items
BAG DECANTER FOR FLEXI CONT (MISCELLANEOUS) ×2 IMPLANT
BENZOIN TINCTURE PRP APPL 2/3 (GAUZE/BANDAGES/DRESSINGS) ×2 IMPLANT
BLADE CLIPPER SURG (BLADE) IMPLANT
BUR MATCHSTICK NEURO 3.0 LAGG (BURR) ×2 IMPLANT
BUR PRECISION FLUTE 6.0 (BURR) ×2 IMPLANT
CANISTER SUCT 3000ML PPV (MISCELLANEOUS) ×2 IMPLANT
CARTRIDGE OIL MAESTRO DRILL (MISCELLANEOUS) ×1 IMPLANT
DIFFUSER DRILL AIR PNEUMATIC (MISCELLANEOUS) ×2 IMPLANT
DRAPE LAPAROTOMY 100X72X124 (DRAPES) ×2 IMPLANT
DRAPE MICROSCOPE LEICA (MISCELLANEOUS) ×2 IMPLANT
DRAPE POUCH INSTRU U-SHP 10X18 (DRAPES) ×2 IMPLANT
DRAPE SURG 17X23 STRL (DRAPES) ×8 IMPLANT
ELECT BLADE 4.0 EZ CLEAN MEGAD (MISCELLANEOUS) ×2
ELECT REM PT RETURN 9FT ADLT (ELECTROSURGICAL) ×2
ELECTRODE BLDE 4.0 EZ CLN MEGD (MISCELLANEOUS) ×1 IMPLANT
ELECTRODE REM PT RTRN 9FT ADLT (ELECTROSURGICAL) ×1 IMPLANT
GAUZE SPONGE 4X4 12PLY STRL (GAUZE/BANDAGES/DRESSINGS) ×2 IMPLANT
GAUZE SPONGE 4X4 16PLY XRAY LF (GAUZE/BANDAGES/DRESSINGS) IMPLANT
GLOVE BIO SURGEON STRL SZ8 (GLOVE) ×2 IMPLANT
GLOVE BIO SURGEON STRL SZ8.5 (GLOVE) ×2 IMPLANT
GLOVE EXAM NITRILE LRG STRL (GLOVE) IMPLANT
GLOVE EXAM NITRILE XL STR (GLOVE) IMPLANT
GLOVE EXAM NITRILE XS STR PU (GLOVE) IMPLANT
GLOVE SURG SS PI 7.0 STRL IVOR (GLOVE) ×8 IMPLANT
GOWN STRL REUS W/ TWL LRG LVL3 (GOWN DISPOSABLE) IMPLANT
GOWN STRL REUS W/ TWL XL LVL3 (GOWN DISPOSABLE) ×1 IMPLANT
GOWN STRL REUS W/TWL 2XL LVL3 (GOWN DISPOSABLE) IMPLANT
GOWN STRL REUS W/TWL LRG LVL3 (GOWN DISPOSABLE)
GOWN STRL REUS W/TWL XL LVL3 (GOWN DISPOSABLE) ×1
KIT BASIN OR (CUSTOM PROCEDURE TRAY) ×2 IMPLANT
KIT ROOM TURNOVER OR (KITS) ×2 IMPLANT
NEEDLE HYPO 21X1.5 SAFETY (NEEDLE) IMPLANT
NEEDLE HYPO 22GX1.5 SAFETY (NEEDLE) ×2 IMPLANT
NS IRRIG 1000ML POUR BTL (IV SOLUTION) ×2 IMPLANT
OIL CARTRIDGE MAESTRO DRILL (MISCELLANEOUS) ×2
PACK LAMINECTOMY NEURO (CUSTOM PROCEDURE TRAY) ×2 IMPLANT
PAD ARMBOARD 7.5X6 YLW CONV (MISCELLANEOUS) ×6 IMPLANT
PATTIES SURGICAL .5 X1 (DISPOSABLE) IMPLANT
RUBBERBAND STERILE (MISCELLANEOUS) ×4 IMPLANT
SLEEVE SURGEON STRL (DRAPES) ×2 IMPLANT
SPONGE SURGIFOAM ABS GEL SZ50 (HEMOSTASIS) ×2 IMPLANT
STRIP CLOSURE SKIN 1/2X4 (GAUZE/BANDAGES/DRESSINGS) ×2 IMPLANT
SUT VIC AB 1 CT1 18XBRD ANBCTR (SUTURE) ×2 IMPLANT
SUT VIC AB 1 CT1 8-18 (SUTURE) ×2
SUT VIC AB 2-0 CP2 18 (SUTURE) ×4 IMPLANT
SYRINGE 20CC LL (MISCELLANEOUS) ×2 IMPLANT
TAPE CLOTH SURG 4X10 WHT LF (GAUZE/BANDAGES/DRESSINGS) ×2 IMPLANT
TOWEL GREEN STERILE (TOWEL DISPOSABLE) ×2 IMPLANT
TOWEL GREEN STERILE FF (TOWEL DISPOSABLE) ×2 IMPLANT
WATER STERILE IRR 1000ML POUR (IV SOLUTION) ×2 IMPLANT

## 2016-09-06 NOTE — Anesthesia Procedure Notes (Signed)
Procedure Name: Intubation Date/Time: 09/06/2016 1:55 PM Performed by: Greggory Stallion, Josean Lycan L Pre-anesthesia Checklist: Patient identified, Emergency Drugs available, Suction available and Patient being monitored Patient Re-evaluated:Patient Re-evaluated prior to inductionOxygen Delivery Method: Circle System Utilized Preoxygenation: Pre-oxygenation with 100% oxygen Intubation Type: IV induction Ventilation: Mask ventilation without difficulty and Oral airway inserted - appropriate to patient size Laryngoscope Size: Mac and 3 Grade View: Grade II Tube type: Oral Tube size: 7.5 mm Number of attempts: 1 Airway Equipment and Method: Stylet and Oral airway Placement Confirmation: ETT inserted through vocal cords under direct vision,  positive ETCO2 and breath sounds checked- equal and bilateral Secured at: 20 cm Tube secured with: Tape Dental Injury: Teeth and Oropharynx as per pre-operative assessment

## 2016-09-06 NOTE — Anesthesia Preprocedure Evaluation (Signed)
Anesthesia Evaluation  Patient identified by MRN, date of birth, ID band Patient awake    Reviewed: Allergy & Precautions, NPO status , Patient's Chart, lab work & pertinent test results  History of Anesthesia Complications (+) PONV  Airway Mallampati: II  TM Distance: <3 FB Neck ROM: Full    Dental no notable dental hx.    Pulmonary    Pulmonary exam normal breath sounds clear to auscultation       Cardiovascular hypertension, Normal cardiovascular exam Rhythm:Regular Rate:Normal     Neuro/Psych CVA negative psych ROS   GI/Hepatic negative GI ROS, Neg liver ROS,   Endo/Other  diabetes  Renal/GU negative Renal ROS  negative genitourinary   Musculoskeletal negative musculoskeletal ROS (+)   Abdominal   Peds negative pediatric ROS (+)  Hematology negative hematology ROS (+)   Anesthesia Other Findings   Reproductive/Obstetrics negative OB ROS                             Anesthesia Physical Anesthesia Plan  ASA: III  Anesthesia Plan: General   Post-op Pain Management:    Induction: Intravenous  Airway Management Planned: Oral ETT  Additional Equipment:   Intra-op Plan:   Post-operative Plan: Extubation in OR  Informed Consent: I have reviewed the patients History and Physical, chart, labs and discussed the procedure including the risks, benefits and alternatives for the proposed anesthesia with the patient or authorized representative who has indicated his/her understanding and acceptance.   Dental advisory given  Plan Discussed with: CRNA and Surgeon  Anesthesia Plan Comments:         Anesthesia Quick Evaluation

## 2016-09-06 NOTE — Progress Notes (Signed)
Subjective:  The patient is alert and pleasant. She is in no apparent distress. She looks well.  Objective: Vital signs in last 24 hours: Temp:  [97.5 F (36.4 C)] 97.5 F (36.4 C) (04/30 1603) Pulse Rate:  [60-66] 61 (04/30 1618) Resp:  [13-20] 13 (04/30 1618) BP: (157-179)/(72-86) 160/83 (04/30 1618) SpO2:  [98 %-100 %] 100 % (04/30 1618) Weight:  [93.4 kg (206 lb)] 93.4 kg (206 lb) (04/30 1203)  Intake/Output from previous day: No intake/output data recorded. Intake/Output this shift: Total I/O In: 1300 [I.V.:1300] Out: 150 [Blood:150]  Physical exam the patient is alert and pleasant. She is moving her lower extremities well.  Lab Results: No results for input(s): WBC, HGB, HCT, PLT in the last 72 hours. BMET No results for input(s): NA, K, CL, CO2, GLUCOSE, BUN, CREATININE, CALCIUM in the last 72 hours.  Studies/Results: Dg Lumbar Spine 1 View  Result Date: 09/06/2016 CLINICAL DATA:  L4-5 laminectomy EXAM: LUMBAR SPINE - 1 VIEW COMPARISON:  CT myelogram lumbar spine 07/09/2016 FINDINGS: L5-S1 is the lowest disc space consistent with myelogram Surgical instrument is present under the lamina at L4-5 marking the L4-5 disc space. IMPRESSION: L4-5 disc space localized in the operating room. Electronically Signed   By: Franchot Gallo M.D.   On: 09/06/2016 15:38    Assessment/Plan: The patient is doing well. I spoke with her son.  LOS: 0 days     Quame Spratlin D 09/06/2016, 4:27 PM

## 2016-09-06 NOTE — Transfer of Care (Signed)
Immediate Anesthesia Transfer of Care Note  Patient: [name]  Procedure(s) Performed: Procedure(s) with comments: LAMINECTOMY AND FORAMINOTOMY LUMBAR THREE- LUMBAR FOUR, LUMBAR FOUR- LUMBAR FIVE (N/A) - LAMINECTOMY AND FORAMINOTOMY LUMBAR 3- LUMBAR 4, LUMBAR 4- LUMBAR 5   Patient Location: PACU  Anesthesia Type:General  Level of Consciousness: awake, alert , oriented and patient cooperative  Airway & Oxygen Therapy: Patient Spontanous Breathing and Patient connected to nasal cannula oxygen  Post-op Assessment: Report given to RN, Post -op Vital signs reviewed and stable and Patient moving all extremities  Post vital signs: Reviewed and stable  Last Vitals:  Vitals:   09/06/16 1203 09/06/16 1300  BP: (!) 179/86 (!) 164/72  Pulse: 60   Resp: 20   Temp: 36.4 C     Last Pain:  Vitals:   09/06/16 1210  TempSrc:   PainSc: 4          Complications: No apparent anesthesia complications

## 2016-09-06 NOTE — Anesthesia Postprocedure Evaluation (Addendum)
Anesthesia Post Note  Patient: Jatoya Armbrister  Procedure(s) Performed: Procedure(s) (LRB): LAMINECTOMY AND FORAMINOTOMY LUMBAR THREE- LUMBAR FOUR, LUMBAR FOUR- LUMBAR FIVE (N/A)  Patient location during evaluation: PACU Anesthesia Type: General Level of consciousness: awake and alert Pain management: pain level controlled Vital Signs Assessment: post-procedure vital signs reviewed and stable Respiratory status: spontaneous breathing, nonlabored ventilation, respiratory function stable and patient connected to nasal cannula oxygen Cardiovascular status: blood pressure returned to baseline and stable Postop Assessment: no signs of nausea or vomiting Anesthetic complications: no       Last Vitals:  Vitals:   09/06/16 1618 09/06/16 1633  BP: (!) 160/83 (!) 152/68  Pulse: 61 62  Resp: 13 18  Temp:      Last Pain:  Vitals:   09/06/16 1630  TempSrc:   PainSc: 5                  Apollos Tenbrink S

## 2016-09-06 NOTE — H&P (Signed)
Subjective: The patient is a 71 year old white female who has complained of back, buttock and leg pain consistent with neurogenic claudication. She has failed medical management and was worked up with a lumbar MRI. This demonstrated the patient has spinal stenosis at L3-4 and L4-5. I discussed the various treatment options with the patient. She has decided to proceed with an L3-4 and L4-5 laminectomy after weighing the risks, benefits, and alternatives to surgery.  Past Medical History:  Diagnosis Date  . A-fib ([place])    back in 2013, for stent placement in kidney, she had a bout of a-fib.  Now, back in rhythm  . Adenomatous colon polyp   . Arthritis   . Chronic kidney disease   . Complication of anesthesia   . Diabetes mellitus without complication ([place])    type 2    only takes metformin  . Dysuria   . History of kidney stones   . Hypertension   . Osteopenia   . PONV (postoperative nausea and vomiting)   . Sleep apnea    lost over 120 lbs, and no long uses it (close to 2 yrs now)  . Stroke [facility])    mini 2003--affected right side of body.   took 6 weeks to get over.  . Urine, incontinence, stress female     Past Surgical History:  Procedure Laterality Date  . COLONOSCOPY W/ POLYPECTOMY Right   . COLONOSCOPY WITH PROPOFOL N/A 01/09/2015   Procedure: COLONOSCOPY WITH PROPOFOL;  Surgeon: Manya Silvas, MD;  Location: [facility] ENDOSCOPY;  Service: Endoscopy;  Laterality: N/A;  . EYE SURGERY     bilateral cataracts with implants  . JOINT REPLACEMENT     left knee  TKR  . KIDNEY STONE SURGERY     40 yrs ago, while pregnant, she had to 'be cut in 1/2 to get stone out'  . KNEE ARTHROSCOPY     left knee  . LITHOTRIPSY    . ltk Left   . throidectomy      Allergies  Allergen Reactions  . Morphine And Related Nausea And Vomiting  . Vicodin [Hydrocodone-Acetaminophen] Nausea And Vomiting    Tolerates acetaminophen    Social History  Substance Use Topics  . Smoking status: Never  Smoker  . Smokeless tobacco: Never Used  . Alcohol use No    History reviewed. No pertinent family history. Prior to Admission medications   Medication Sig Start Date End Date Taking? Authorizing Provider  allopurinol (ZYLOPRIM) 100 MG tablet Take 100 mg by mouth daily.   Yes Historical Provider, MD  amLODipine (NORVASC) 10 MG tablet Take 10 mg by mouth daily.  07/05/16  Yes Historical Provider, MD  aspirin 81 MG tablet Take 81 mg by mouth at bedtime.    Yes Historical Provider, MD  Estriol 10 % CREA Place 0.5 g vaginally at bedtime as needed (burning/itching).   Yes Historical Provider, MD  gabapentin (NEURONTIN) 100 MG capsule Take 200 mg by mouth at bedtime.  07/05/16 07/05/17 Yes Historical Provider, MD  metFORMIN (GLUCOPHAGE) 1000 MG tablet Take 1,000 mg by mouth 2 (two) times daily with a meal.   Yes Historical Provider, MD  metoprolol (LOPRESSOR) 100 MG tablet Take 100 mg by mouth daily.    Yes Historical Provider, MD  simvastatin (ZOCOR) 20 MG tablet Take 20 mg by mouth at bedtime.    Yes Historical Provider, MD  vitamin B-12 (CYANOCOBALAMIN) 1000 MCG tablet Take 1,000 mcg by mouth daily.   Yes Historical Provider, MD  oxyCODONE-acetaminophen (PERCOCET/ROXICET) 5-325 MG tablet Take 1 tablet by mouth daily as needed (pain).     Historical Provider, MD  Polyethyl Glycol-Propyl Glycol (SYSTANE OP) Place 1 drop into both eyes daily as needed (dry eyes).    Historical Provider, MD     Review of Systems  Positive ROS: As above  All other systems have been reviewed and were otherwise negative with the exception of those mentioned in the HPI and as above.  Objective: Vital signs in last 24 hours: Temp:  [97.5 F (36.4 C)] 97.5 F (36.4 C) (04/30 1203) Pulse Rate:  [60] 60 (04/30 1203) Resp:  [20] 20 (04/30 1203) BP: (164-179)/(72-86) 164/72 (04/30 1300) SpO2:  [98 %] 98 % (04/30 1203) Weight:  [93.4 kg (206 lb)] 93.4 kg (206 lb) (04/30 1203)  General Appearance: Alert Head:  Normocephalic, without obvious abnormality, atraumatic Eyes: PERRL, conjunctiva/corneas clear, EOM's intact,    Ears: Normal  Throat: Normal  Neck: Supple, Back: unremarkable Lungs: Clear to auscultation bilaterally, respirations unlabored Heart: Regular rate and rhythm, no murmur, rub or gallop Abdomen: Soft, non-tender Extremities: Extremities normal, atraumatic, no cyanosis or edema Skin: unremarkable  NEUROLOGIC:   Mental status: alert and oriented,Motor Exam - grossly normal Sensory Exam - grossly normal Reflexes:  Coordination - grossly normal Gait - grossly normal Balance - grossly normal Cranial Nerves: I: smell Not tested  II: visual acuity  OS: Normal  OD: Normal   II: visual fields Full to confrontation  II: pupils Equal, round, reactive to light  III,VII: ptosis None  III,IV,VI: extraocular muscles  Full ROM  V: mastication Normal  V: facial light touch sensation  Normal  V,VII: corneal reflex  Present  VII: facial muscle function - upper  Normal  VII: facial muscle function - lower Normal  VIII: hearing Not tested  IX: soft palate elevation  Normal  IX,X: gag reflex Present  XI: trapezius strength  5/5  XI: sternocleidomastoid strength 5/5  XI: neck flexion strength  5/5  XII: tongue strength  Normal    Data Review Lab Results  Component Value Date   WBC 8.1 08/30/2016   HGB 14.2 08/30/2016   HCT 42.2 08/30/2016   MCV 88.5 08/30/2016   PLT 243 08/30/2016   Lab Results  Component Value Date   NA 139 08/30/2016   K 4.2 08/30/2016   CL 104 08/30/2016   CO2 26 08/30/2016   BUN 18 08/30/2016   CREATININE 1.58 (H) 08/30/2016   GLUCOSE 155 (H) 08/30/2016   No results found for: INR, PROTIME  Assessment/Plan: L3-4 and L4-5 spinal stenosis, lumbago, lumbar radiculopathy, neurogenic claudication: I have discussed the situation with the patient and reviewed her imaging studies with her. We have discussed the various treatment options regarding surgery.  I have described the surgical treatment option of an L3-4 and L4-5 laminectomy/laminotomy/foraminotomy. I have shown her surgical models. We have discussed the risks, benefits, alternatives, expected postoperative course, and likelihood of achieving our goals with surgery. I have answered all her questions. She has decided to proceed with surgery.   Katlen Seyer D 09/06/2016 1:41 PM

## 2016-09-06 NOTE — Op Note (Signed)
Brief history: The patient is a 71 year old white female who is complained of back, buttock and leg pain consistent with neurogenic claudication. She has failed medical management and was worked up with a lumbar myelo CT. This demonstrated a thoracic lumbar scoliosis with severe stenosis at L3-4 and L4-5. I discussed the various treatment options with the patient. She has weighed the risks, benefits, and alternatives to surgery and signed to proceed with a L3-4 and L4-5 laminectomy/laminotomy/foraminotomy.  Preoperative diagnosis: L3-4 and L4-5 spinal stenosis, scoliosis, lumbar radiculopathy, lumbago, neurogenic claudication  Postoperative diagnosis: The same  Procedure: L4 laminectomy with bilateral L3-4 laminotomies to decompress the bilateral L3, L4 and L5 nerve roots using micro-dissection  Surgeon: Dr. Earle Gell  Asst.: Dr. Ellene Route  Anesthesia: Gen. endotracheal  Estimated blood loss: 125 mL  Drains: None  Complications: None  Description of procedure: The patient was brought to the operating room by the anesthesia team. General endotracheal anesthesia was induced. The patient was turned to the prone position on the Wilson frame. The patient's lumbosacral region was then prepared with Betadine scrub and Betadine solution. Sterile drapes were applied.  I then injected the area to be incised with Marcaine with epinephrine solution. I then used a scalpel to make a linear midline incision over the L3-4 and L4-5 intervertebral disc space. I then used electrocautery to perform a bilateral  subperiosteal dissection exposing the spinous process and lamina of L3, L4 and L5. We obtained intraoperative radiograph to confirm our location. I then inserted the [facility] retractor for exposure. We incised the interspinous ligament at L3-4 and L4-5 with a scalpel. We used the rongeurs to remove the spinous process of L4.  We then brought the operative microscope into the field. Under its  magnification and illumination we completed the microdissection. I used a high-speed drill to perform a laminotomy at L3-4 and L4-5 bilaterally. I then used a Kerrison punches to widen the laminotomy and removed the ligamentum flavum at L3-4 and L4-5 bilaterally. We then used microdissection to free up the thecal sac and the bilateral L3, L4 and L5 nerve root from the epidural tissue. I then used a Kerrison punch to perform a foraminotomy at about the bilateral L3, L4 and L5 nerve root. We inspected the intervertebral disc at L3-4 and L4-5 bilaterally. There were no herniations.  I then palpated along the ventral surface of the thecal sac and along exit route of the bilateral L3, L4 and L5 nerve root and noted that the neural structures were well decompressed. This completed the decompression.  We then obtained hemostasis using bipolar electrocautery. We irrigated the wound out with bacitracin solution. We then removed the retractor. We then reapproximated the patient's thoracolumbar fascia with interrupted #1 Vicryl suture. We then reapproximated the patient's subcutaneous tissue with interrupted 2-0 Vicryl suture. We then reapproximated patient's skin with Steri-Strips and benzoin. The was then coated with bacitracin ointment. The drapes were removed. The patient was subsequently returned to the supine position where they were extubated by the anesthesia team. The patient was then transported to the postanesthesia care unit in stable condition. All sponge instrument and needle counts were reportedly correct at the end of this case.

## 2016-09-07 ENCOUNTER — Encounter (HOSPITAL_COMMUNITY): Payer: Self-pay | Admitting: Neurosurgery

## 2016-09-07 DIAGNOSIS — M48062 Spinal stenosis, lumbar region with neurogenic claudication: Secondary | ICD-10-CM | POA: Diagnosis not present

## 2016-09-07 DIAGNOSIS — M419 Scoliosis, unspecified: Secondary | ICD-10-CM | POA: Diagnosis not present

## 2016-09-07 DIAGNOSIS — I129 Hypertensive chronic kidney disease with stage 1 through stage 4 chronic kidney disease, or unspecified chronic kidney disease: Secondary | ICD-10-CM | POA: Diagnosis not present

## 2016-09-07 DIAGNOSIS — Z87442 Personal history of urinary calculi: Secondary | ICD-10-CM | POA: Diagnosis not present

## 2016-09-07 DIAGNOSIS — N189 Chronic kidney disease, unspecified: Secondary | ICD-10-CM | POA: Diagnosis not present

## 2016-09-07 DIAGNOSIS — M1990 Unspecified osteoarthritis, unspecified site: Secondary | ICD-10-CM | POA: Diagnosis not present

## 2016-09-07 DIAGNOSIS — M5416 Radiculopathy, lumbar region: Secondary | ICD-10-CM | POA: Diagnosis not present

## 2016-09-07 DIAGNOSIS — E1122 Type 2 diabetes mellitus with diabetic chronic kidney disease: Secondary | ICD-10-CM | POA: Diagnosis not present

## 2016-09-07 DIAGNOSIS — Z8673 Personal history of transient ischemic attack (TIA), and cerebral infarction without residual deficits: Secondary | ICD-10-CM | POA: Diagnosis not present

## 2016-09-07 LAB — GLUCOSE, CAPILLARY
GLUCOSE-CAPILLARY: 224 mg/dL — AB (ref 65–99)
GLUCOSE-CAPILLARY: 139 mg/dL — AB (ref 65–99)
GLUCOSE-CAPILLARY: 146 mg/dL — AB (ref 65–99)
Glucose-Capillary: 179 mg/dL — ABNORMAL HIGH (ref 65–99)

## 2016-09-07 MED ORDER — DOCUSATE SODIUM 100 MG PO CAPS: 100.0000 mg | ORAL_CAPSULE | Freq: Two times a day (BID) | ORAL | 0 refills | Status: DC

## 2016-09-07 MED ORDER — OXYCODONE-ACETAMINOPHEN 5-325 MG PO TABS
1.0000 | ORAL_TABLET | ORAL | Status: DC | PRN
Start: 2016-09-07 — End: 2016-09-08
  Administered 2016-09-07 – 2016-09-08 (×3): 1 via ORAL
  Filled 2016-09-07 (×3): qty 1

## 2016-09-07 MED ORDER — CYCLOBENZAPRINE HCL 10 MG PO TABS: 10.0000 mg | ORAL_TABLET | Freq: Three times a day (TID) | ORAL | 1 refills | Status: DC | PRN

## 2016-09-07 MED ORDER — OXYCODONE HCL 5 MG PO TABS: 5.0000 mg | ORAL_TABLET | ORAL | 0 refills | Status: DC | PRN

## 2016-09-07 NOTE — Evaluation (Signed)
Physical Therapy Evaluation Patient Details Name: [name] MRN: [id number] DOB: 1945-10-25 Today's Date: 09/07/2016   History of Present Illness  Pt admitted 4/30 with L3-4 and L4-5 spinal stenosis, lumbago, lumbar radiculopathy, neurogenic claudication, lumbar scoliosis. Pt s/p L4 laminectomy with bilateral L3-4 laminotomies to decompress the bilateral L3, L4 and L5 nerve roots using micro-dissection. PMH: stroke HTN CKD DM. PSH: L TKA  Clinical Impression  Patient is s/p above surgery resulting in the deficits listed below (see PT Problem List). Pt with weak LEs and high anxiety.Pt unable to navigate stairs despite 5 attempts.Pt will need to be able to navigate 4 steps to enter home. Patient will benefit from skilled PT to increase their independence and safety with mobility (while adhering to their precautions) to allow discharge to the venue listed below.     Follow Up Recommendations Home health PT;Supervision/Assistance - 24 hour (may need SNF if can't do the stairs)    Equipment Recommendations  None recommended by PT    Recommendations for Other Services       Precautions / Restrictions Precautions Precautions: Back;Fall Precaution Booklet Issued: Yes (comment) Precaution Comments: due to high anxiety pt with no carryover Restrictions Weight Bearing Restrictions: No      Mobility  Bed Mobility Overal bed mobility: Needs Assistance Bed Mobility: Rolling;Sidelying to Sit Rolling: Min guard Sidelying to sit: Min assist       General bed mobility comments: minA for trunk elevation, increased time  Transfers Overall transfer level: Needs assistance   Transfers: Sit to/from Stand Sit to Stand: Min guard         General transfer comment: v/c's for hand placement, increased time,, educated on incrased surface height to minimize bending  Ambulation/Gait Ambulation/Gait assistance: Min guard Ambulation Distance (Feet): 50 Feet Assistive device: Rolling  walker (2 wheeled) Gait Pattern/deviations: Step-through pattern;Decreased stride length;Antalgic;Wide base of support Gait velocity: slow Gait velocity interpretation: Below normal speed for age/gender General Gait Details: pt with R sided limp, able to achieve erect posture. increased anxiety with increased ambulation. educated not to use rollater  Stairs Stairs: Yes       General stair comments: attempted to navigate steps x 5 trails. pt with R handraill, attempted side ways leading with L foot and backwards with walker. Pt unable to do either. RN present. Pt very anxious regarding not twisting or bending. of back. Pt dependent on pulling up with UEs PTA, pt unable to push up with L LE due to weakness and fear/anxiety. discussed how we need to get up the stair safely due to pt planned for d/c home TODAY.  Wheelchair Mobility    Modified Rankin (Stroke Patients Only)       Balance Overall balance assessment: Needs assistance Sitting-balance support: Feet supported;No upper extremity supported Sitting balance-Leahy Scale: Fair     Standing balance support: Bilateral upper extremity supported Standing balance-Leahy Scale: Poor Standing balance comment: pt furniture reaching or requires use of RW for safe standing                             Pertinent Vitals/Pain Pain Assessment: 0-10 Pain Score: 3  Pain Location: surgical site Pain Descriptors / Indicators: Sore Pain Intervention(s): Monitored during session    Home Living Family/patient expects to be discharged to:: Private residence Living Arrangements: Spouse/significant other Available Help at Discharge: Family;Available 24 hours/day Type of Home: House Home Access: Stairs to enter Entrance Stairs-Rails: Right Entrance Stairs-Number of  Steps: 4 Home Layout: One level Home Equipment: [place] - 2 wheels;Walker - 4 wheels;Bedside commode;Shower seat;Grab bars - tub/shower Additional Comments: pt reports she  sleeps in a lift chair    Prior Function Level of Independence: Needs assistance   Gait / Transfers Assistance Needed: uses rollator and shower seat a  ADL's / Homemaking Assistance Needed: uses long handled brush, shower seat in shower        Hand Dominance   Dominant Hand: Right    Extremity/Trunk Assessment   Upper Extremity Assessment Upper Extremity Assessment: Generalized weakness    Lower Extremity Assessment Lower Extremity Assessment: RLE deficits/detail;LLE deficits/detail RLE Deficits / Details: grossly 3-/5 LLE Deficits / Details: grossly 3-/5    Cervical / Trunk Assessment Cervical / Trunk Assessment: Other exceptions Cervical / Trunk Exceptions: back surgery  Communication   Communication: No difficulties  Cognition Arousal/Alertness: Awake/alert Behavior During Therapy: Anxious Overall Cognitive Status: Impaired/Different from baseline Area of Impairment: Safety/judgement                         Safety/Judgement: Decreased awareness of safety;Decreased awareness of deficits     General Comments: pt very anxious. Pt unable to navigate the 4 stairs and reports my son will just carry me. Pt is 206 pounds and just had back surgery.      General Comments      Exercises     Assessment/Plan    PT Assessment Patient needs continued PT services  PT Problem List Decreased strength;Decreased range of motion;Decreased activity tolerance;Decreased balance;Decreased mobility;Decreased coordination;Decreased cognition;Decreased knowledge of use of DME;Decreased safety awareness;Pain       PT Treatment Interventions DME instruction;Gait training;Stair training;Functional mobility training;Therapeutic activities;Therapeutic exercise;Balance training;Neuromuscular re-education    PT Goals (Current goals can be found in the Care Plan section)  Acute Rehab PT Goals Patient Stated Goal: home today PT Goal Formulation: With patient Time For Goal  Achievement: 09/14/16 Potential to Achieve Goals: Good    Frequency Min 5X/week   Barriers to discharge Inaccessible home environment pt unable to ascend stairs    Co-evaluation               AM-PAC PT "6 Clicks" Daily Activity  Outcome Measure Difficulty turning over in bed (including adjusting bedclothes, sheets and blankets)?: A Little Difficulty moving from lying on back to sitting on the side of the bed? : A Little Difficulty sitting down on and standing up from a chair with arms (e.g., wheelchair, bedside commode, etc,.)?: A Little Help needed moving to and from a bed to chair (including a wheelchair)?: A Little Help needed walking in hospital room?: A Lot Help needed climbing 3-5 steps with a railing? : Total 6 Click Score: 15    End of Session Equipment Utilized During Treatment: Gait belt Activity Tolerance:  (limited by anxiety) Patient left: in chair;with call bell/phone within reach;with nursing/sitter in room Nurse Communication: Mobility status PT Visit Diagnosis: Pain;Difficulty in walking, not elsewhere classified (R26.2);Muscle weakness (generalized) (M62.81) Pain - part of body:  (back)    Time: 3716-9678 PT Time Calculation (min) (ACUTE ONLY): 38 min   Charges:   PT Evaluation $PT Eval Moderate Complexity: 1 Procedure PT Treatments $Gait Training: 8-22 mins $Therapeutic Activity: 8-22 mins   PT G Codes:   PT G-Codes **NOT FOR INPATIENT CLASS** Functional Assessment Tool Used: Clinical judgement Functional Limitation: Mobility: Walking and moving around Mobility: Walking and Moving Around Current Status (L3810): At least 40 percent but  less than 60 percent impaired, limited or restricted Mobility: Walking and Moving Around Goal Status [phone]): At least 1 percent but less than 20 percent impaired, limited or restricted   Kittie Plater, PT, DPT Pager #: [phone] Office #: [phone]  [place] 09/07/2016, 8:37 AM

## 2016-09-07 NOTE — Progress Notes (Signed)
Physical Therapy Treatment Patient Details Name: [name] MRN: [id number] DOB: November 21, 1945 Today's Date: 09/07/2016    History of Present Illness Pt admitted 4/30 with L3-4 and L4-5 spinal stenosis, lumbago, lumbar radiculopathy, neurogenic claudication, lumbar scoliosis. Pt s/p L4 laminectomy with bilateral L3-4 laminotomies to decompress the bilateral L3, L4 and L5 nerve roots using micro-dissection. PMH: stroke HTN CKD DM. PSH: L TKA    PT Comments    Pt very very anxious and mildly confused. Son reports this to be abnormal for patient. Pt remains unable to navigate up stairs despite assist of son. Spoke extensively with son and patient regarding d/c options. Discussed renting/borrowing a w/c to bump up the stairs backwards. Recommended ST-SNF to allow for pt to increased strength and endurance to improve indep with mobility as spouse unable to physically assist pt. Pt will not have 24/7 assist and currently unable to enter home. RN to check with MD and case management to see if pt can stay another night to allow for anxiety and confusion to diminish and progress mobiltiy for safe return home.  Follow Up Recommendations  Home health PT;Supervision/Assistance - 24 hour     Equipment Recommendations  None recommended by PT    Recommendations for Other Services       Precautions / Restrictions Precautions Precautions: Back;Fall Precaution Booklet Issued: Yes (comment) Precaution Comments: due to high anxiety pt with no carryover Restrictions Weight Bearing Restrictions: No    Mobility  Bed Mobility Overal bed mobility: Needs Assistance Bed Mobility: Rolling;Sidelying to Sit;Sit to Sidelying Rolling: Min guard Sidelying to sit: Min assist     Sit to sidelying: Min assist General bed mobility comments: minA for trunk elevation and LE management  Transfers Overall transfer level: Needs assistance   Transfers: Sit to/from Stand Sit to Stand: Min guard          General transfer comment: v/c's for hand placement  Ambulation/Gait Ambulation/Gait assistance: Min guard Ambulation Distance (Feet): 50 Feet Assistive device: Rolling walker (2 wheeled) Gait Pattern/deviations: Step-through pattern;Decreased stride length;Antalgic;Wide base of support Gait velocity: slow Gait velocity interpretation: Below normal speed for age/gender General Gait Details: pt with R sided limp, able to achieve erect posture. increased anxiety with increased ambulation. educated not to use rollater   Stairs Stairs: Yes       General stair comments: son present in hopes to calm pt however pt still unable to navigate stairs  Wheelchair Mobility    Modified Rankin (Stroke Patients Only)       Balance Overall balance assessment: Needs assistance Sitting-balance support: Feet supported;No upper extremity supported Sitting balance-Leahy Scale: Fair     Standing balance support: Bilateral upper extremity supported Standing balance-Leahy Scale: Poor                              Cognition Arousal/Alertness: Awake/alert Behavior During Therapy: Anxious Overall Cognitive Status: Impaired/Different from baseline Area of Impairment: Safety/judgement                         Safety/Judgement: Decreased awareness of safety;Decreased awareness of deficits     General Comments: pt very anxious. Pt unable to navigate the 4 stairs and reports my son will just carry me. Pt is 206 pounds and just had back surgery.      Exercises      General Comments        Pertinent Vitals/Pain Pain Assessment:  0-10 Pain Score: 3  Pain Location: surgical site Pain Descriptors / Indicators: Sore Pain Intervention(s): Monitored during session    Home Living                      Prior Function            PT Goals (current goals can now be found in the care plan section) Acute Rehab PT Goals Patient Stated Goal: home today Progress  towards PT goals: Progressing toward goals    Frequency    Min 5X/week      PT Plan Current plan remains appropriate    Co-evaluation              AM-PAC PT "6 Clicks" Daily Activity  Outcome Measure  Difficulty turning over in bed (including adjusting bedclothes, sheets and blankets)?: A Little Difficulty moving from lying on back to sitting on the side of the bed? : A Little Difficulty sitting down on and standing up from a chair with arms (e.g., wheelchair, bedside commode, etc,.)?: A Little Help needed moving to and from a bed to chair (including a wheelchair)?: A Little Help needed walking in hospital room?: A Little Help needed climbing 3-5 steps with a railing? : Total 6 Click Score: 16    End of Session Equipment Utilized During Treatment: Gait belt   Patient left: in bed;with call bell/phone within reach;with family/visitor present Nurse Communication: Mobility status PT Visit Diagnosis: Pain;Difficulty in walking, not elsewhere classified (R26.2);Muscle weakness (generalized) (M62.81)     Time: 2122-4825 PT Time Calculation (min) (ACUTE ONLY): 27 min  Charges:  $Gait Training: 8-22 mins $Therapeutic Activity: 8-22 mins                    G Codes:       Kittie Plater, PT, DPT Pager #: [phone] Office #: [phone]   [place] 09/07/2016, 3:05 PM

## 2016-09-07 NOTE — Discharge Summary (Signed)
Physician Discharge Summary  Patient ID: [name] MRN: [id number] DOB/AGE: 06-03-1945 71 y.o.  Admit date: 09/06/2016 Discharge date: 09/07/2016  Admission Diagnoses:L3-4 and L4-5 spinal stenosis, lumbago, lumbar radiculopathy, neurogenic claudication, lumbar scoliosis  Discharge Diagnoses: The same Active Problems:   Spinal stenosis of lumbar region with neurogenic claudication   Discharged Condition: good  Hospital Course: I performed an L3-4 and L4-5 laminectomy/foraminotomy/laminotomy on the patient on 09/06/2016. The surgery went well.  The patient's postoperative course was unremarkable. On postoperative day #1 she requested discharge home. She was given written and oral discharge instructions. All her questions were answered.  Consults: Physical therapy Significant Diagnostic Studies: None Treatments: L3-4 and L4-5 laminectomy/laminotomy/foraminotomy Using microdissection. Discharge Exam: Blood pressure 120/64, pulse 68, temperature 98.4 F (36.9 C), temperature source Oral, resp. rate 18, weight 93.4 kg (206 lb), SpO2 94 %. The patient is alert and pleasant. She looks well. She is in no apparent distress. Her strength is grossly normal in her lower extremities.  Disposition: Home  Discharge Instructions    Call MD for:  difficulty breathing, headache or visual disturbances    Complete by:  As directed    Call MD for:  extreme fatigue    Complete by:  As directed    Call MD for:  hives    Complete by:  As directed    Call MD for:  persistant dizziness or light-headedness    Complete by:  As directed    Call MD for:  persistant nausea and vomiting    Complete by:  As directed    Call MD for:  redness, tenderness, or signs of infection (pain, swelling, redness, odor or green/yellow discharge around incision site)    Complete by:  As directed    Call MD for:  severe uncontrolled pain    Complete by:  As directed    Call MD for:  temperature >100.4     Complete by:  As directed    Diet - low sodium heart healthy    Complete by:  As directed    Discharge instructions    Complete by:  As directed    Call [phone] for a followup appointment. Take a stool softener while you are using pain medications.   Driving Restrictions    Complete by:  As directed    Do not drive for 2 weeks.   Increase activity slowly    Complete by:  As directed    Lifting restrictions    Complete by:  As directed    Do not lift more than 5 pounds. No excessive bending or twisting.   May shower / Bathe    Complete by:  As directed    He may shower after the pain she is removed 3 days after surgery. Leave the incision alone.   Remove dressing in 48 hours    Complete by:  As directed    Your stitches are under the scan and will dissolve by themselves. The Steri-Strips will fall off after you take a few showers. Do not rub back or pick at the wound, Leave the wound alone.     Allergies as of 09/07/2016      Reactions   Morphine And Related Nausea And Vomiting   Vicodin [hydrocodone-acetaminophen] Nausea And Vomiting   Tolerates acetaminophen      Medication List    STOP taking these medications   oxyCODONE-acetaminophen 5-325 MG tablet Commonly known as:  PERCOCET/ROXICET     TAKE these medications  allopurinol 100 MG tablet Commonly known as:  ZYLOPRIM Take 100 mg by mouth daily.   amLODipine 10 MG tablet Commonly known as:  NORVASC Take 10 mg by mouth daily.   aspirin 81 MG tablet Take 81 mg by mouth at bedtime.   cyclobenzaprine 10 MG tablet Commonly known as:  FLEXERIL Take 1 tablet (10 mg total) by mouth 3 (three) times daily as needed for muscle spasms.   docusate sodium 100 MG capsule Commonly known as:  COLACE Take 1 capsule (100 mg total) by mouth 2 (two) times daily.   Estriol 10 % Crea Place 0.5 g vaginally at bedtime as needed (burning/itching).   gabapentin 100 MG capsule Commonly known as:  NEURONTIN Take 200 mg by  mouth at bedtime.   metFORMIN 1000 MG tablet Commonly known as:  GLUCOPHAGE Take 1,000 mg by mouth 2 (two) times daily with a meal.   metoprolol 100 MG tablet Commonly known as:  LOPRESSOR Take 100 mg by mouth daily.   oxyCODONE 5 MG immediate release tablet Commonly known as:  Oxy IR/ROXICODONE Take 1-2 tablets (5-10 mg total) by mouth every 4 (four) hours as needed for severe pain or breakthrough pain.   simvastatin 20 MG tablet Commonly known as:  ZOCOR Take 20 mg by mouth at bedtime.   SYSTANE OP Place 1 drop into both eyes daily as needed (dry eyes).   vitamin B-12 1000 MCG tablet Commonly known as:  CYANOCOBALAMIN Take 1,000 mcg by mouth daily.        SignedOphelia Charter 09/07/2016, 7:46 AM

## 2016-09-08 DIAGNOSIS — M1990 Unspecified osteoarthritis, unspecified site: Secondary | ICD-10-CM | POA: Diagnosis not present

## 2016-09-08 DIAGNOSIS — Z87442 Personal history of urinary calculi: Secondary | ICD-10-CM | POA: Diagnosis not present

## 2016-09-08 DIAGNOSIS — M48062 Spinal stenosis, lumbar region with neurogenic claudication: Secondary | ICD-10-CM | POA: Diagnosis not present

## 2016-09-08 DIAGNOSIS — E1122 Type 2 diabetes mellitus with diabetic chronic kidney disease: Secondary | ICD-10-CM | POA: Diagnosis not present

## 2016-09-08 DIAGNOSIS — I129 Hypertensive chronic kidney disease with stage 1 through stage 4 chronic kidney disease, or unspecified chronic kidney disease: Secondary | ICD-10-CM | POA: Diagnosis not present

## 2016-09-08 DIAGNOSIS — M419 Scoliosis, unspecified: Secondary | ICD-10-CM | POA: Diagnosis not present

## 2016-09-08 DIAGNOSIS — Z8673 Personal history of transient ischemic attack (TIA), and cerebral infarction without residual deficits: Secondary | ICD-10-CM | POA: Diagnosis not present

## 2016-09-08 DIAGNOSIS — N189 Chronic kidney disease, unspecified: Secondary | ICD-10-CM | POA: Diagnosis not present

## 2016-09-08 DIAGNOSIS — M5416 Radiculopathy, lumbar region: Secondary | ICD-10-CM | POA: Diagnosis not present

## 2016-09-08 LAB — GLUCOSE, CAPILLARY: Glucose-Capillary: 109 mg/dL — ABNORMAL HIGH (ref 65–99)

## 2016-09-08 NOTE — Progress Notes (Signed)
Physical Therapy Treatment Patient Details Name: [name] MRN: [id number] DOB: 1945/06/20 Today's Date: 09/08/2016    History of Present Illness Pt admitted 4/30 with L3-4 and L4-5 spinal stenosis, lumbago, lumbar radiculopathy, neurogenic claudication, lumbar scoliosis. Pt s/p L4 laminectomy with bilateral L3-4 laminotomies to decompress the bilateral L3, L4 and L5 nerve roots using micro-dissection. PMH: stroke HTN CKD DM. PSH: L TKA    PT Comments    Pt demonstrated decreased anxiety today, and reported she wanted to complete stair navigation. Pt completed 4 stairs with min A for safety, with PT recommending having assistance with stairs upon d/c home. Pt reported increased confidence with stairs after completion. Pt demonstrated improved activity tolerance with gait with ability to ambulate ~475ft with RW and min [name]. PT reviewed car transfer and mobility expectations. No follow up PT recommended due to pt progressing toward goals and demonstrates understanding of precautions and safety within the home.   Follow Up Recommendations  No PT follow up     Equipment Recommendations  None recommended by PT    Recommendations for Other Services       Precautions / Restrictions Precautions Precautions: Back;Fall Precaution Booklet Issued: No Precaution Comments: reviewed spinal precautions with pt able to verbalize 3/3 Restrictions Weight Bearing Restrictions: No    Mobility  Bed Mobility Overal bed mobility: Needs Assistance Bed Mobility: Sit to Sidelying   Sidelying to sit: Min assist       General bed mobility comments: min A req for LE managment. pt req VCs to roll onto side before going into supine to decrease back pain  Transfers Overall transfer level: Needs assistance   Transfers: Sit to/from Stand Sit to Stand: Min Chord Takahashi         General transfer comment: pt req increased time for sit to stand due to pain. VC req for hand placement during stand to  sit.   Ambulation/Gait Ambulation/Gait assistance: Supervision Ambulation Distance (Feet): 400 Feet Assistive device: Rolling walker (2 wheeled) Gait Pattern/deviations: Step-through pattern;Decreased stride length;Narrow base of support Gait velocity: decreased Gait velocity interpretation: Below normal speed for age/gender General Gait Details: gait with HHA and furniture walking within hospital room to get to RW. pt req VC to increase gait speed and stride length, but unable to maintain due to fatigue. Pt demonstrated occassional RLE limb with reports that her leg "gave out". Pt req standing rest break with BUE support with RW during gait due to fatigue.    Stairs Stairs: Yes   Stair Management: One rail Right;Step to pattern;Sideways Number of Stairs: 4 General stair comments: pt able to complete 4 stairs using BUE on R rail with min A for safety and maintain stability. Pt descended stairs backwards with BUE support on R rail. Pt reported she felt more confident doing stairs at home. PT recommended having another person to assist as needed upon d/c home.   Wheelchair Mobility    Modified Rankin (Stroke Patients Only)       Balance Overall balance assessment: Needs assistance Sitting-balance support: Feet supported Sitting balance-Leahy Scale: Fair     Standing balance support: Bilateral upper extremity supported Standing balance-Leahy Scale: Fair                              Cognition Arousal/Alertness: Awake/alert Behavior During Therapy: WFL for tasks assessed/performed Overall Cognitive Status: Within Functional Limits for tasks assessed Area of Impairment: Safety/judgement  Safety/Judgement: Decreased awareness of safety     General Comments: pt demonstrated decreased anxiety with stairs and gait.       Exercises      General Comments        Pertinent Vitals/Pain Pain Assessment: 0-10 Pain Score: 7  Pain  Location: surgical site and low back Pain Descriptors / Indicators: Sore Pain Intervention(s): Monitored during session    Home Living                      Prior Function            PT Goals (current goals can now be found in the care plan section) Acute Rehab PT Goals Patient Stated Goal: home today PT Goal Formulation: With patient Time For Goal Achievement: 09/14/16 Potential to Achieve Goals: Good Progress towards PT goals: Progressing toward goals    Frequency    Min 5X/week      PT Plan Discharge plan needs to be updated    Co-evaluation     PT goals addressed during session: Mobility/safety with mobility        AM-PAC PT "6 Clicks" Daily Activity  Outcome Measure  Difficulty turning over in bed (including adjusting bedclothes, sheets and blankets)?: Total  Difficulty moving from lying on back to sitting on the side of the bed? : Total  Difficulty sitting down on and standing up from a chair with arms (e.g., wheelchair, bedside commode, etc,.)?: Total  Help needed moving to and from a bed to chair (including a wheelchair)?: A Little Help needed walking in hospital room?: A Little Help needed climbing 3-5 steps with a railing? : A Little 6 Click Score: 12    End of Session Equipment Utilized During Treatment: Gait belt   Patient left: in bed;with call bell/phone within reach Nurse Communication: Mobility status PT Visit Diagnosis: Muscle weakness (generalized) (M62.81);Difficulty in walking, not elsewhere classified (R26.2);Pain     Time: 0902-0919 PT Time Calculation (min) (ACUTE ONLY): 17 min  Charges:  $Gait Training: 8-22 mins                    G Codes:       Loma Sousa, SPT  [phone]   Melvenia Beam 09/08/2016, 10:03 AM

## 2016-09-08 NOTE — Progress Notes (Signed)
Patient alert and oriented, with no confusion today, mae's well, voiding adequate amount of urine, swallowing without difficulty, no c/o pain at time of discharge. Patient discharged home with family. Script and discharged instructions given to patient. Patient and family stated understanding of instructions given. Patient has an appointment with Dr. Arnoldo Morale.

## 2016-10-11 NOTE — Addendum Note (Signed)
Addendum  created 10/11/16 1356 by Myrtie Soman, MD   Sign clinical note

## 2016-12-28 DIAGNOSIS — E118 Type 2 diabetes mellitus with unspecified complications: Secondary | ICD-10-CM | POA: Diagnosis not present

## 2016-12-28 DIAGNOSIS — Z0000 Encounter for general adult medical examination without abnormal findings: Secondary | ICD-10-CM | POA: Diagnosis not present

## 2017-01-05 DIAGNOSIS — M48062 Spinal stenosis, lumbar region with neurogenic claudication: Secondary | ICD-10-CM | POA: Diagnosis not present

## 2017-01-05 DIAGNOSIS — E118 Type 2 diabetes mellitus with unspecified complications: Secondary | ICD-10-CM | POA: Diagnosis not present

## 2017-01-05 DIAGNOSIS — M1000 Idiopathic gout, unspecified site: Secondary | ICD-10-CM | POA: Diagnosis not present

## 2017-01-05 DIAGNOSIS — N184 Chronic kidney disease, stage 4 (severe): Secondary | ICD-10-CM | POA: Diagnosis not present

## 2017-02-16 DIAGNOSIS — Z23 Encounter for immunization: Secondary | ICD-10-CM | POA: Diagnosis not present

## 2017-03-08 DIAGNOSIS — Z6835 Body mass index (BMI) 35.0-35.9, adult: Secondary | ICD-10-CM | POA: Diagnosis not present

## 2017-03-08 DIAGNOSIS — M545 Low back pain: Secondary | ICD-10-CM | POA: Diagnosis not present

## 2017-03-08 DIAGNOSIS — I10 Essential (primary) hypertension: Secondary | ICD-10-CM | POA: Diagnosis not present

## 2017-04-05 DIAGNOSIS — F4321 Adjustment disorder with depressed mood: Secondary | ICD-10-CM | POA: Diagnosis not present

## 2017-04-25 DIAGNOSIS — H26491 Other secondary cataract, right eye: Secondary | ICD-10-CM | POA: Diagnosis not present

## 2017-05-09 DIAGNOSIS — H26492 Other secondary cataract, left eye: Secondary | ICD-10-CM | POA: Diagnosis not present

## 2017-05-12 DIAGNOSIS — F4321 Adjustment disorder with depressed mood: Secondary | ICD-10-CM | POA: Diagnosis not present

## 2017-06-09 IMAGING — MG MM DIGITAL SCREENING BILAT W/ CAD
6 series · 6 of 6 positions shown · non-contrast
Comparison: Previous exam(s).

CLINICAL DATA: Screening.

EXAM:
DIGITAL SCREENING BILATERAL MAMMOGRAM WITH CAD

[R CC]
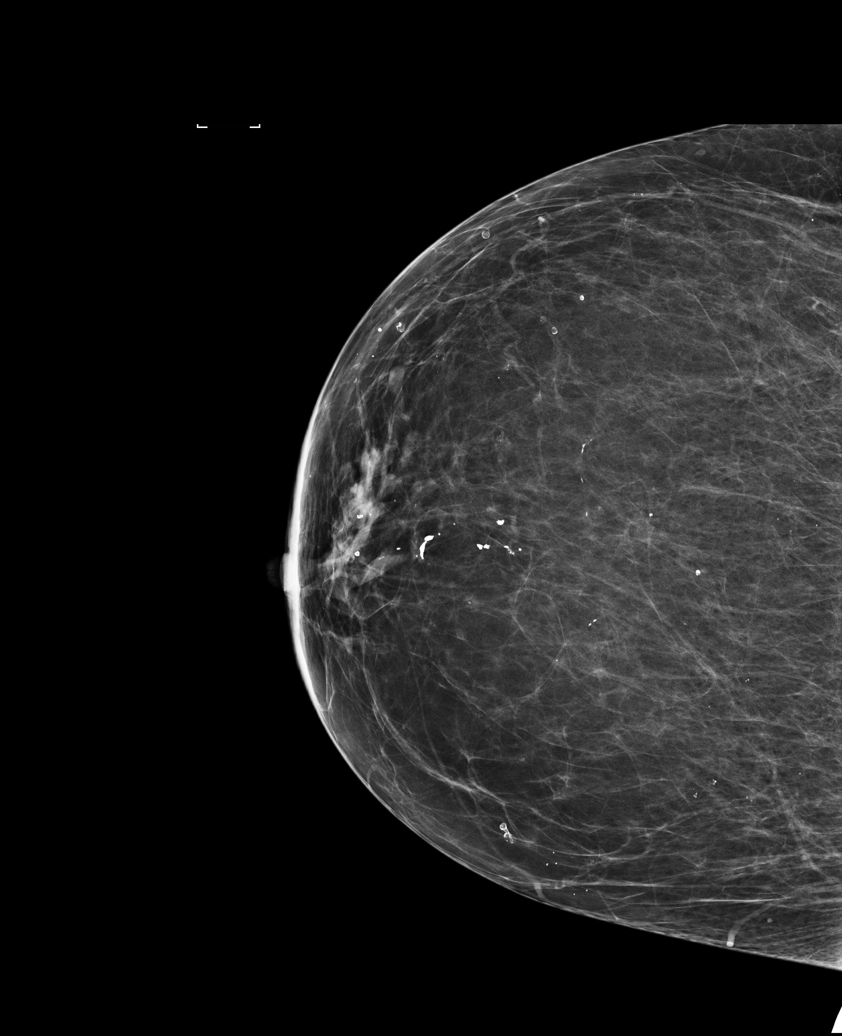

[R MLO (1 of 2)]
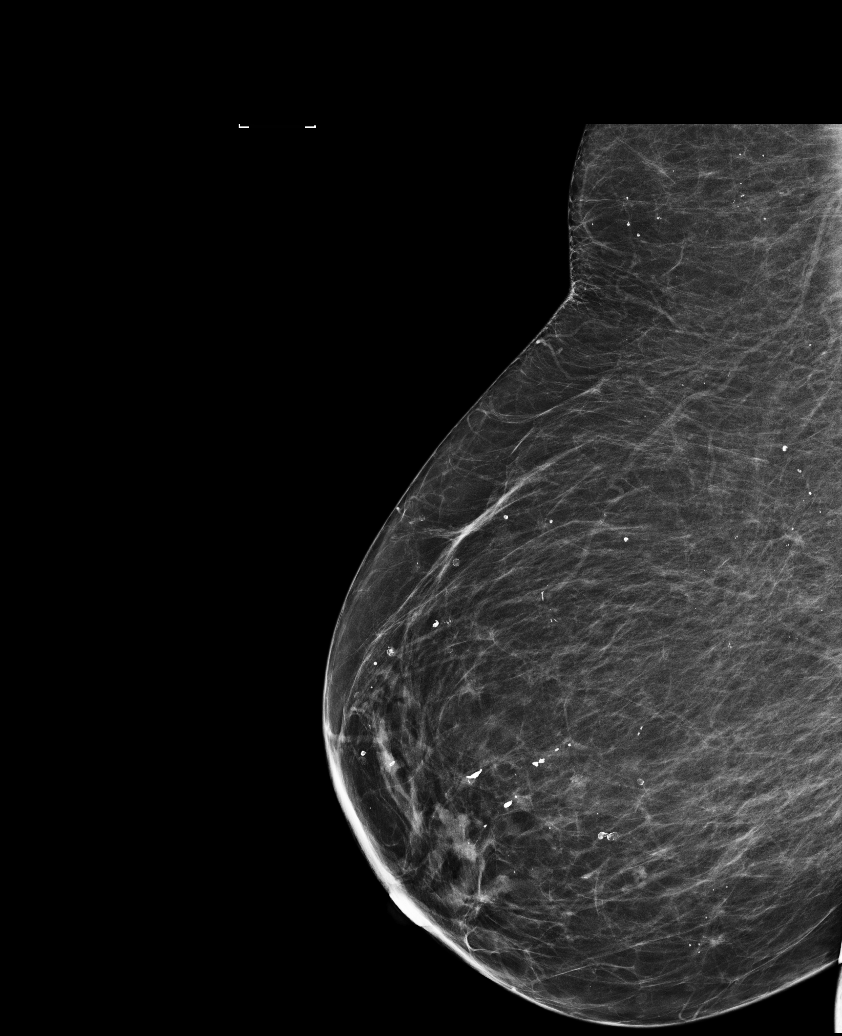

[L MLO (1 of 2)]
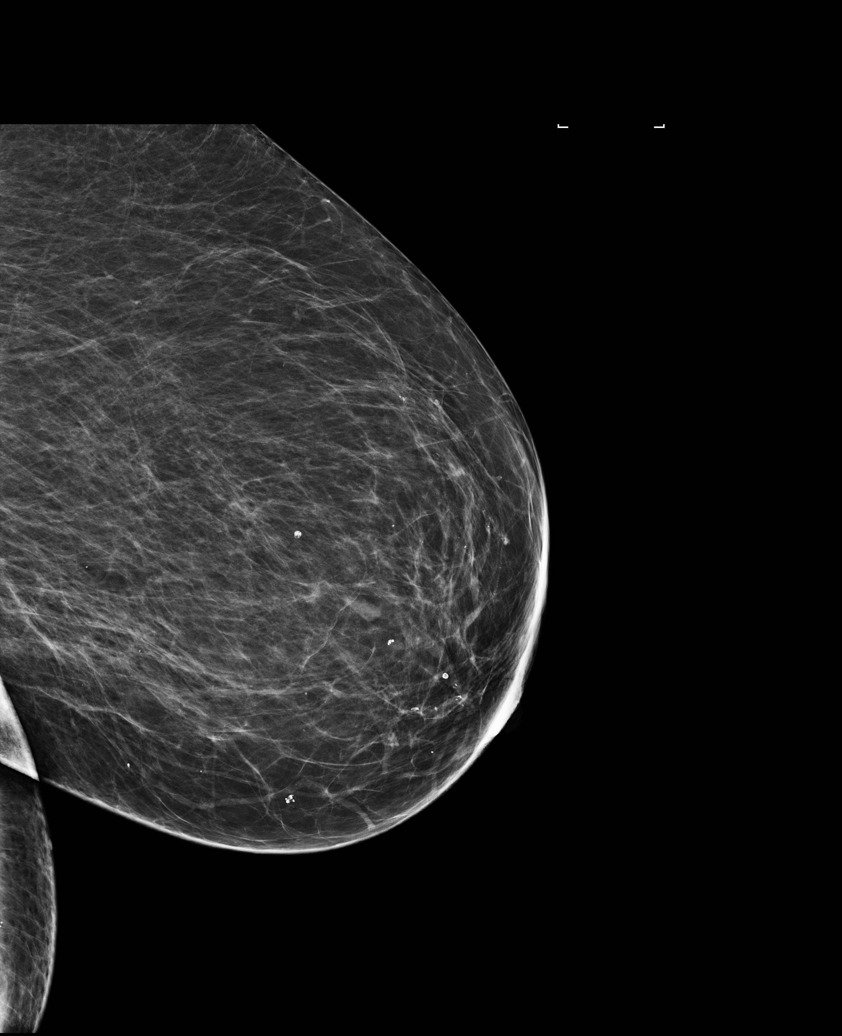

[L MLO (2 of 2)]
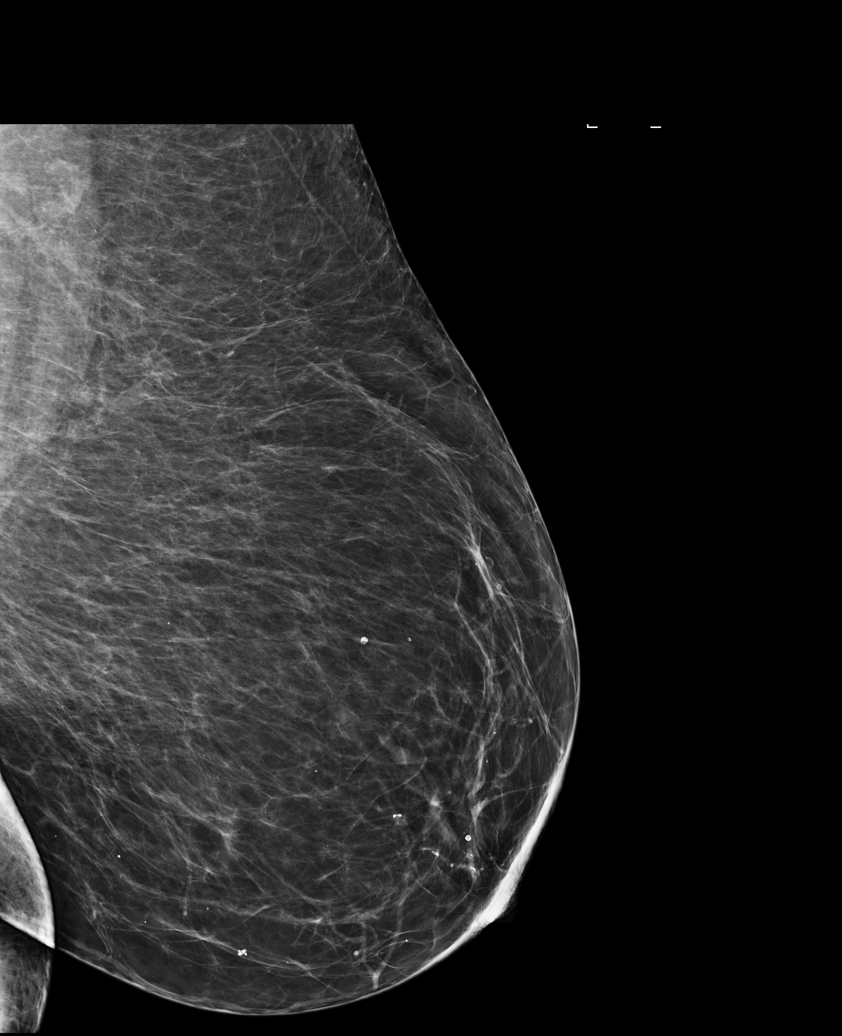

[R MLO (2 of 2)]
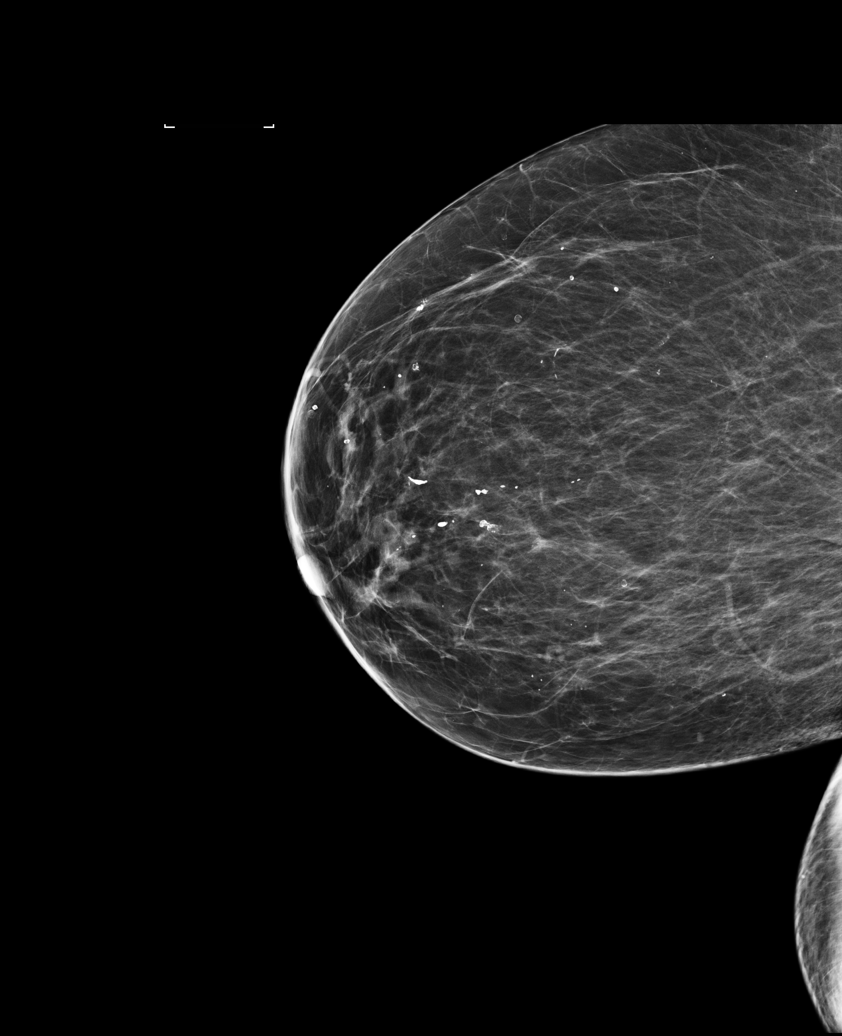

[L CC]
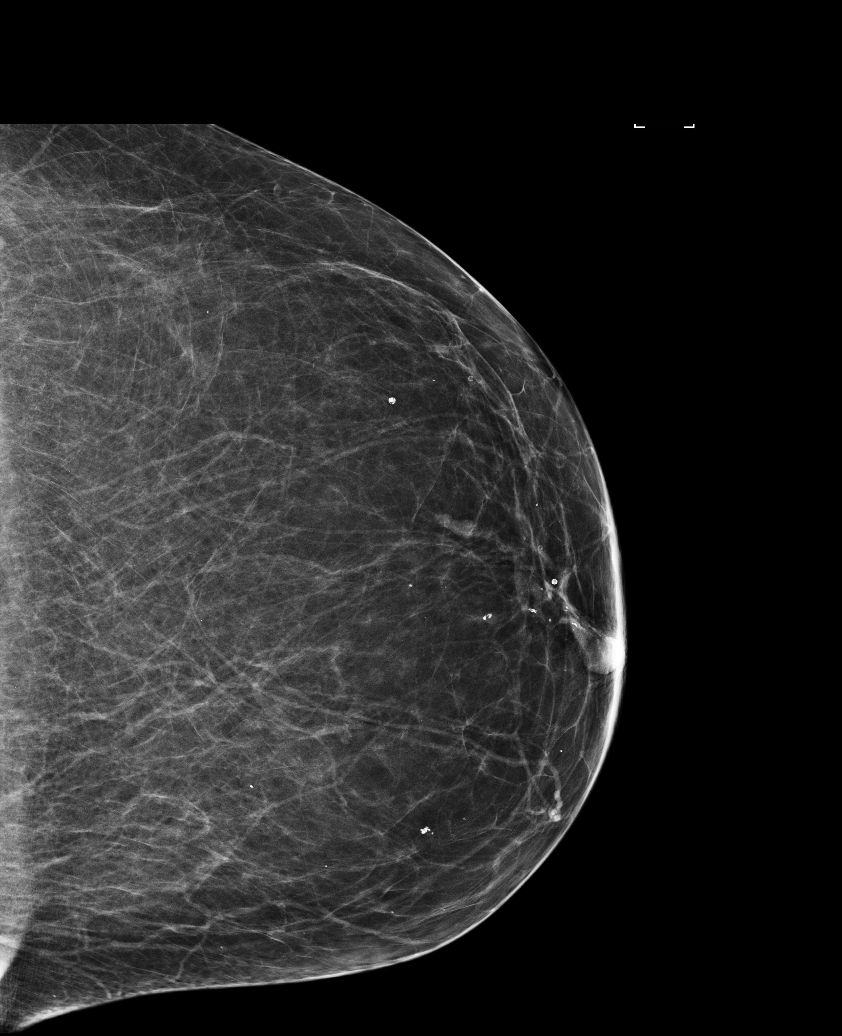

[6 of 6 positions shown; findings below may reference images not displayed]

ACR Breast Density Category b: There are scattered areas of
fibroglandular density.
FINDINGS: There are no findings suspicious for malignancy. Images were
processed with CAD.
IMPRESSION: No mammographic evidence of malignancy. A result letter of this
screening mammogram will be mailed directly to the patient.

RECOMMENDATION:
Screening mammogram in one year. (Code:AS-G-LCT)

BI-RADS CATEGORY  1: Negative.

## 2017-06-22 ENCOUNTER — Other Ambulatory Visit: Payer: Self-pay | Admitting: Internal Medicine

## 2017-06-22 DIAGNOSIS — Z1231 Encounter for screening mammogram for malignant neoplasm of breast: Secondary | ICD-10-CM

## 2017-07-04 DIAGNOSIS — E118 Type 2 diabetes mellitus with unspecified complications: Secondary | ICD-10-CM | POA: Diagnosis not present

## 2017-07-04 DIAGNOSIS — M1000 Idiopathic gout, unspecified site: Secondary | ICD-10-CM | POA: Diagnosis not present

## 2017-07-07 DIAGNOSIS — E118 Type 2 diabetes mellitus with unspecified complications: Secondary | ICD-10-CM | POA: Diagnosis not present

## 2017-07-08 DIAGNOSIS — F4321 Adjustment disorder with depressed mood: Secondary | ICD-10-CM | POA: Diagnosis not present

## 2017-07-12 DIAGNOSIS — E118 Type 2 diabetes mellitus with unspecified complications: Secondary | ICD-10-CM | POA: Diagnosis not present

## 2017-07-12 DIAGNOSIS — Z0000 Encounter for general adult medical examination without abnormal findings: Secondary | ICD-10-CM | POA: Diagnosis not present

## 2017-07-12 DIAGNOSIS — Z9989 Dependence on other enabling machines and devices: Secondary | ICD-10-CM | POA: Diagnosis not present

## 2017-07-12 DIAGNOSIS — N183 Chronic kidney disease, stage 3 (moderate): Secondary | ICD-10-CM | POA: Diagnosis not present

## 2017-07-12 DIAGNOSIS — G4733 Obstructive sleep apnea (adult) (pediatric): Secondary | ICD-10-CM | POA: Diagnosis not present

## 2017-08-01 ENCOUNTER — Ambulatory Visit
Admission: RE | Admit: 2017-08-01 | Discharge: 2017-08-01 | Disposition: A | Discharge status: Home / Self Care | Payer: Medicare HMO | Source: Ambulatory Visit | Attending: Internal Medicine | Admitting: Internal Medicine

## 2017-08-01 DIAGNOSIS — Z1231 Encounter for screening mammogram for malignant neoplasm of breast: Secondary | ICD-10-CM | POA: Diagnosis not present

## 2017-09-06 DIAGNOSIS — M4125 Other idiopathic scoliosis, thoracolumbar region: Secondary | ICD-10-CM | POA: Diagnosis not present

## 2017-09-06 DIAGNOSIS — I10 Essential (primary) hypertension: Secondary | ICD-10-CM | POA: Diagnosis not present

## 2017-09-06 DIAGNOSIS — M545 Low back pain: Secondary | ICD-10-CM | POA: Diagnosis not present

## 2017-09-06 DIAGNOSIS — Z6836 Body mass index (BMI) 36.0-36.9, adult: Secondary | ICD-10-CM | POA: Diagnosis not present

## 2017-09-07 DIAGNOSIS — M5136 Other intervertebral disc degeneration, lumbar region: Secondary | ICD-10-CM | POA: Diagnosis not present

## 2017-09-07 DIAGNOSIS — M4306 Spondylolysis, lumbar region: Secondary | ICD-10-CM | POA: Diagnosis not present

## 2017-10-04 DIAGNOSIS — M4306 Spondylolysis, lumbar region: Secondary | ICD-10-CM | POA: Diagnosis not present

## 2017-10-04 DIAGNOSIS — M47816 Spondylosis without myelopathy or radiculopathy, lumbar region: Secondary | ICD-10-CM | POA: Diagnosis not present

## 2017-11-15 DIAGNOSIS — M5136 Other intervertebral disc degeneration, lumbar region: Secondary | ICD-10-CM | POA: Diagnosis not present

## 2017-11-15 DIAGNOSIS — M4306 Spondylolysis, lumbar region: Secondary | ICD-10-CM | POA: Diagnosis not present

## 2018-01-05 DIAGNOSIS — E118 Type 2 diabetes mellitus with unspecified complications: Secondary | ICD-10-CM | POA: Diagnosis not present

## 2018-01-12 DIAGNOSIS — N183 Chronic kidney disease, stage 3 (moderate): Secondary | ICD-10-CM | POA: Diagnosis not present

## 2018-01-12 DIAGNOSIS — M1000 Idiopathic gout, unspecified site: Secondary | ICD-10-CM | POA: Diagnosis not present

## 2018-01-12 DIAGNOSIS — E1151 Type 2 diabetes mellitus with diabetic peripheral angiopathy without gangrene: Secondary | ICD-10-CM | POA: Insufficient documentation

## 2018-01-31 DIAGNOSIS — M4306 Spondylolysis, lumbar region: Secondary | ICD-10-CM | POA: Diagnosis not present

## 2018-01-31 DIAGNOSIS — M47816 Spondylosis without myelopathy or radiculopathy, lumbar region: Secondary | ICD-10-CM | POA: Diagnosis not present

## 2018-03-02 DIAGNOSIS — M47816 Spondylosis without myelopathy or radiculopathy, lumbar region: Secondary | ICD-10-CM | POA: Diagnosis not present

## 2018-03-09 DIAGNOSIS — Z23 Encounter for immunization: Secondary | ICD-10-CM | POA: Diagnosis not present

## 2018-03-27 DIAGNOSIS — M47816 Spondylosis without myelopathy or radiculopathy, lumbar region: Secondary | ICD-10-CM | POA: Diagnosis not present

## 2018-03-27 DIAGNOSIS — M4306 Spondylolysis, lumbar region: Secondary | ICD-10-CM | POA: Diagnosis not present

## 2018-03-27 DIAGNOSIS — M5136 Other intervertebral disc degeneration, lumbar region: Secondary | ICD-10-CM | POA: Diagnosis not present

## 2018-04-13 DIAGNOSIS — E1165 Type 2 diabetes mellitus with hyperglycemia: Secondary | ICD-10-CM | POA: Diagnosis not present

## 2018-04-17 DIAGNOSIS — M47816 Spondylosis without myelopathy or radiculopathy, lumbar region: Secondary | ICD-10-CM | POA: Diagnosis not present

## 2018-05-29 DIAGNOSIS — M47816 Spondylosis without myelopathy or radiculopathy, lumbar region: Secondary | ICD-10-CM | POA: Diagnosis not present

## 2018-05-29 DIAGNOSIS — M5136 Other intervertebral disc degeneration, lumbar region: Secondary | ICD-10-CM | POA: Diagnosis not present

## 2018-07-07 DIAGNOSIS — E1151 Type 2 diabetes mellitus with diabetic peripheral angiopathy without gangrene: Secondary | ICD-10-CM | POA: Diagnosis not present

## 2018-07-07 DIAGNOSIS — R8290 Unspecified abnormal findings in urine: Secondary | ICD-10-CM | POA: Diagnosis not present

## 2018-07-12 DIAGNOSIS — E119 Type 2 diabetes mellitus without complications: Secondary | ICD-10-CM | POA: Diagnosis not present

## 2018-07-14 DIAGNOSIS — N183 Chronic kidney disease, stage 3 (moderate): Secondary | ICD-10-CM | POA: Diagnosis not present

## 2018-07-14 DIAGNOSIS — I69951 Hemiplegia and hemiparesis following unspecified cerebrovascular disease affecting right dominant side: Secondary | ICD-10-CM | POA: Diagnosis not present

## 2018-07-14 DIAGNOSIS — Z0000 Encounter for general adult medical examination without abnormal findings: Secondary | ICD-10-CM | POA: Diagnosis not present

## 2018-07-14 DIAGNOSIS — Z9989 Dependence on other enabling machines and devices: Secondary | ICD-10-CM | POA: Diagnosis not present

## 2018-07-14 DIAGNOSIS — G4733 Obstructive sleep apnea (adult) (pediatric): Secondary | ICD-10-CM | POA: Diagnosis not present

## 2018-07-14 DIAGNOSIS — E1151 Type 2 diabetes mellitus with diabetic peripheral angiopathy without gangrene: Secondary | ICD-10-CM | POA: Diagnosis not present

## 2018-07-17 ENCOUNTER — Other Ambulatory Visit: Payer: Self-pay | Admitting: Internal Medicine

## 2018-07-17 DIAGNOSIS — Z1231 Encounter for screening mammogram for malignant neoplasm of breast: Secondary | ICD-10-CM

## 2018-10-30 ENCOUNTER — Other Ambulatory Visit: Payer: Self-pay

## 2018-10-30 ENCOUNTER — Ambulatory Visit
Admission: RE | Admit: 2018-10-30 | Discharge: 2018-10-30 | Disposition: A | Discharge status: Home / Self Care | Payer: Medicare HMO | Source: Ambulatory Visit | Attending: Internal Medicine | Admitting: Internal Medicine

## 2018-10-30 DIAGNOSIS — Z1231 Encounter for screening mammogram for malignant neoplasm of breast: Secondary | ICD-10-CM

## 2019-01-09 DIAGNOSIS — E1151 Type 2 diabetes mellitus with diabetic peripheral angiopathy without gangrene: Secondary | ICD-10-CM | POA: Diagnosis not present

## 2019-01-16 DIAGNOSIS — N183 Chronic kidney disease, stage 3 (moderate): Secondary | ICD-10-CM | POA: Diagnosis not present

## 2019-01-16 DIAGNOSIS — M5136 Other intervertebral disc degeneration, lumbar region: Secondary | ICD-10-CM | POA: Diagnosis not present

## 2019-01-16 DIAGNOSIS — E538 Deficiency of other specified B group vitamins: Secondary | ICD-10-CM | POA: Diagnosis not present

## 2019-01-16 DIAGNOSIS — E1122 Type 2 diabetes mellitus with diabetic chronic kidney disease: Secondary | ICD-10-CM | POA: Diagnosis not present

## 2019-01-16 DIAGNOSIS — E785 Hyperlipidemia, unspecified: Secondary | ICD-10-CM | POA: Diagnosis not present

## 2019-01-24 ENCOUNTER — Emergency Department
Admission: EM | Admit: 2019-01-24 | Discharge: 2019-01-24 | Disposition: A | Discharge status: Home / Self Care | Payer: Medicare HMO | Attending: Emergency Medicine | Admitting: Emergency Medicine

## 2019-01-24 ENCOUNTER — Emergency Department: Payer: Medicare HMO

## 2019-01-24 ENCOUNTER — Encounter: Payer: Self-pay | Admitting: Emergency Medicine

## 2019-01-24 ENCOUNTER — Other Ambulatory Visit: Payer: Self-pay

## 2019-01-24 DIAGNOSIS — N12 Tubulo-interstitial nephritis, not specified as acute or chronic: Secondary | ICD-10-CM | POA: Diagnosis not present

## 2019-01-24 DIAGNOSIS — Z79899 Other long term (current) drug therapy: Secondary | ICD-10-CM | POA: Diagnosis not present

## 2019-01-24 DIAGNOSIS — E1122 Type 2 diabetes mellitus with diabetic chronic kidney disease: Secondary | ICD-10-CM | POA: Diagnosis not present

## 2019-01-24 DIAGNOSIS — R1011 Right upper quadrant pain: Secondary | ICD-10-CM

## 2019-01-24 DIAGNOSIS — N184 Chronic kidney disease, stage 4 (severe): Secondary | ICD-10-CM | POA: Insufficient documentation

## 2019-01-24 DIAGNOSIS — R1031 Right lower quadrant pain: Secondary | ICD-10-CM | POA: Diagnosis not present

## 2019-01-24 DIAGNOSIS — Z7982 Long term (current) use of aspirin: Secondary | ICD-10-CM | POA: Insufficient documentation

## 2019-01-24 DIAGNOSIS — Z7984 Long term (current) use of oral hypoglycemic drugs: Secondary | ICD-10-CM | POA: Diagnosis not present

## 2019-01-24 DIAGNOSIS — N10 Acute tubulo-interstitial nephritis: Secondary | ICD-10-CM | POA: Diagnosis not present

## 2019-01-24 DIAGNOSIS — K8050 Calculus of bile duct without cholangitis or cholecystitis without obstruction: Secondary | ICD-10-CM | POA: Diagnosis not present

## 2019-01-24 DIAGNOSIS — R109 Unspecified abdominal pain: Secondary | ICD-10-CM

## 2019-01-24 DIAGNOSIS — I4891 Unspecified atrial fibrillation: Secondary | ICD-10-CM | POA: Insufficient documentation

## 2019-01-24 DIAGNOSIS — R1030 Lower abdominal pain, unspecified: Secondary | ICD-10-CM | POA: Diagnosis not present

## 2019-01-24 DIAGNOSIS — N200 Calculus of kidney: Secondary | ICD-10-CM | POA: Diagnosis not present

## 2019-01-24 LAB — CBC
HCT: 43.5 % (ref 36.0–46.0)
Hemoglobin: 14.4 g/dL (ref 12.0–15.0)
MCH: 29.9 pg (ref 26.0–34.0)
MCHC: 33.1 g/dL (ref 30.0–36.0)
MCV: 90.2 fL (ref 80.0–100.0)
Platelets: 235 10*3/uL (ref 150–400)
RBC: 4.82 MIL/uL (ref 3.87–5.11)
RDW: 13.2 % (ref 11.5–15.5)
WBC: 7.2 10*3/uL (ref 4.0–10.5)
nRBC: 0 % (ref 0.0–0.2)

## 2019-01-24 LAB — BASIC METABOLIC PANEL
Anion gap: 9 (ref 5–15)
BUN: 22 mg/dL (ref 8–23)
CO2: 23 mmol/L (ref 22–32)
Calcium: 9 mg/dL (ref 8.9–10.3)
Chloride: 105 mmol/L (ref 98–111)
Creatinine, Ser: 1.42 mg/dL — ABNORMAL HIGH (ref 0.44–1.00)
GFR calc Af Amer: 42 mL/min — ABNORMAL LOW (ref >60)
GFR calc non Af Amer: 36 mL/min — ABNORMAL LOW (ref >60)
Glucose, Bld: 161 mg/dL — ABNORMAL HIGH (ref 70–99)
Potassium: 4 mmol/L (ref 3.5–5.1)
Sodium: 137 mmol/L (ref 135–145)

## 2019-01-24 LAB — HEPATIC FUNCTION PANEL
ALT: 21 U/L (ref 0–44)
AST: 19 U/L (ref 15–41)
Albumin: 3.3 g/dL — ABNORMAL LOW (ref 3.5–5.0)
Alkaline Phosphatase: 65 U/L (ref 38–126)
Bilirubin, Direct: 0.1 mg/dL (ref 0.0–0.2)
Indirect Bilirubin: 0.7 mg/dL (ref 0.3–0.9)
Total Bilirubin: 0.8 mg/dL (ref 0.3–1.2)
Total Protein: 6.3 g/dL — ABNORMAL LOW (ref 6.5–8.1)

## 2019-01-24 LAB — URINALYSIS, COMPLETE (UACMP) WITH MICROSCOPIC
Bilirubin Urine: NEGATIVE
Glucose, UA: NEGATIVE mg/dL
Ketones, ur: NEGATIVE mg/dL
Nitrite: NEGATIVE
Protein, ur: 100 mg/dL — AB
Specific Gravity, Urine: 1.017 (ref 1.005–1.030)
WBC, UA: >50 WBC/hpf (ref 0–5)
pH: 5 (ref 5.0–8.0)

## 2019-01-24 LAB — LIPASE, BLOOD: Lipase: 42 U/L (ref 11–51)

## 2019-01-24 LAB — GLUCOSE, CAPILLARY: Glucose-Capillary: 104 mg/dL — ABNORMAL HIGH (ref 70–99)

## 2019-01-24 MED ORDER — LACTATED RINGERS IV BOLUS: 1000.0000 mL | Freq: Once | INTRAVENOUS | Status: DC

## 2019-01-24 MED ORDER — ONDANSETRON HCL 4 MG/2ML IJ SOLN
4.0000 mg | Freq: Once | INTRAMUSCULAR | Status: AC
  Administered 2019-01-24: 4 mg via INTRAVENOUS
  Filled 2019-01-24: qty 2

## 2019-01-24 MED ORDER — SODIUM CHLORIDE 0.9 % IV SOLN
1.0000 g | Freq: Once | INTRAVENOUS | Status: AC
  Administered 2019-01-24: 13:00:00 1 g via INTRAVENOUS
  Filled 2019-01-24: qty 10

## 2019-01-24 MED ORDER — CEPHALEXIN 500 MG PO CAPS: 500.0000 mg | ORAL_CAPSULE | Freq: Four times a day (QID) | ORAL | 0 refills | Status: AC

## 2019-01-24 MED ORDER — MORPHINE SULFATE (PF) 4 MG/ML IV SOLN
4.0000 mg | Freq: Once | INTRAVENOUS | Status: AC
  Administered 2019-01-24: 13:00:00 4 mg via INTRAVENOUS
  Filled 2019-01-24: qty 1

## 2019-01-24 MED ORDER — SODIUM CHLORIDE 0.9 % IV BOLUS
1000.0000 mL | Freq: Once | INTRAVENOUS | Status: AC
  Administered 2019-01-24: 13:00:00 1000 mL via INTRAVENOUS

## 2019-01-24 MED ORDER — OXYCODONE-ACETAMINOPHEN 5-325 MG PO TABS: 1.0000 | ORAL_TABLET | ORAL | 0 refills | Status: AC | PRN

## 2019-01-24 MED ORDER — MORPHINE SULFATE (PF) 4 MG/ML IV SOLN
4.0000 mg | Freq: Once | INTRAVENOUS | Status: AC
  Administered 2019-01-24: 17:00:00 4 mg via INTRAVENOUS
  Filled 2019-01-24: qty 1

## 2019-01-24 NOTE — ED Notes (Signed)
This RN at bedside after pt returned from ultrasound, assisted to bathroom. Pt reports increased pain to flank area 8/10. Notified Dr Charna Archer- VORB 4mg  morphine.

## 2019-01-24 NOTE — ED Provider Notes (Signed)
[facility] Emergency Department Provider Note   ____________________________________________   First MD Initiated Contact with Patient 01/24/19 1235     (approximate)  I have reviewed the triage vital signs and the nursing notes.   HISTORY  Chief Complaint Flank Pain    HPI [name] is a 73 y.o. female with past medical history of diabetes, CKD, nephrolithiasis presents to the ED complaining of flank pain.  Patient reports she has had approximately 3 days of gradually worsening pain in her right flank.  She states this is been associated with some dark urine as well as discomfort when she urinates.  She spoke with her PCP about this problem 2 days ago, and he called in ciprofloxacin for her.  She states she has been taking this for the past 2 days with no improvement in her symptoms.  She has not had any fevers, chills, nausea, vomiting, abdominal pain, cough, chest pain, or shortness of breath.  She states symptoms are similar to prior kidney infections as well as kidney stones.        Past Medical History:  Diagnosis Date  . A-fib ([place])    back in 2013, for stent placement in kidney, she had a bout of a-fib.  Now, back in rhythm  . Arthritis   . Chronic kidney disease   . Complication of anesthesia   . Diabetes mellitus without complication ([place])    type 2    only takes metformin  . Dysuria   . History of kidney stones   . Hypertension   . Osteopenia   . PONV (postoperative nausea and vomiting)   . Sleep apnea    lost over 120 lbs, and no long uses it (close to 2 yrs now)  . Stroke [facility])    mini 2003--affected right side of body.   took 6 weeks to get over.  . Urine, incontinence, stress female     Patient Active Problem List   Diagnosis Date Noted  . Osteoarthritis 07/09/2016  . Recurrent nephrolithiasis 07/09/2016  . Benign essential hypertension 07/05/2016  . History of CVA (cerebrovascular accident) 07/05/2016  .  Medicare annual wellness visit, initial 07/05/2016  . Spinal stenosis of lumbar region with neurogenic claudication 04/19/2016  . OSA on CPAP 12/29/2015  . CKD (chronic kidney disease) stage 4, GFR 15-29 ml/min (HCC) 05/29/2015  . H/O adenomatous polyp of colon 01/02/2015  . Polyarticular gout 05/22/2014  . Diabetes mellitus type 2, controlled, with complications ([place]) 25/63/8937  . Foreign body in bladder and urethra 08/31/2013  . Uric acid nephrolithiasis 08/31/2013  . Kidney stone 08/09/2013  . Ureteric stone 08/09/2013  . Hydronephrosis 08/09/2013  . Renal colic 34/28/7681    Past Surgical History:  Procedure Laterality Date  . COLONOSCOPY W/ POLYPECTOMY Right   . COLONOSCOPY WITH PROPOFOL N/A 01/09/2015   Procedure: COLONOSCOPY WITH PROPOFOL;  Surgeon: Manya Silvas, MD;  Location: [facility] ENDOSCOPY;  Service: Endoscopy;  Laterality: N/A;  . EYE SURGERY     bilateral cataracts with implants  . JOINT REPLACEMENT     left knee  TKR  . KIDNEY STONE SURGERY     40 yrs ago, while pregnant, she had to 'be cut in 1/2 to get stone out'  . KNEE ARTHROSCOPY     left knee  . LITHOTRIPSY    . ltk Left   . LUMBAR LAMINECTOMY/DECOMPRESSION MICRODISCECTOMY N/A 09/06/2016   Procedure: LAMINECTOMY AND FORAMINOTOMY LUMBAR THREE- LUMBAR FOUR, LUMBAR FOUR- LUMBAR FIVE;  Surgeon: Newman Pies, MD;  Location: [place];  Service: Neurosurgery;  Laterality: N/A;  LAMINECTOMY AND FORAMINOTOMY LUMBAR 3- LUMBAR 4, LUMBAR 4- LUMBAR 5   . throidectomy      Prior to Admission medications   Medication Sig Start Date End Date Taking? Authorizing Provider  allopurinol (ZYLOPRIM) 100 MG tablet Take 100 mg by mouth daily.   Yes [provider]  amLODipine (NORVASC) 10 MG tablet Take 10 mg by mouth daily.  07/05/16  Yes [provider]  aspirin 81 MG tablet Take 81 mg by mouth at bedtime.    Yes [provider]  ciprofloxacin (CIPRO) 250 MG tablet Take 250 mg by mouth 2 (two) times  daily. 01/18/19  Yes [provider]  Dulaglutide (TRULICITY) 1.60 VP/7.1GG SOPN Inject 0.75 mg into the skin once a week.   Yes [provider]  gabapentin (NEURONTIN) 100 MG capsule Take 100-200 mg by mouth 2 (two) times daily. Take two capsules (200 mg) every morning and one capsule (100 mg) every evening 08/22/18  Yes [provider]  HYDROcodone-acetaminophen (NORCO/VICODIN) 5-325 MG tablet Take 1 tablet by mouth every 6 (six) hours as needed. for pain 01/16/19  Yes [provider]  metFORMIN (GLUCOPHAGE) 500 MG tablet Take 1,000 mg by mouth 2 (two) times daily with a meal.  01/10/19  Yes [provider]  metoprolol (LOPRESSOR) 100 MG tablet Take 100 mg by mouth daily.    Yes [provider]  simvastatin (ZOCOR) 20 MG tablet Take 20 mg by mouth at bedtime.    Yes [provider]  vitamin B-12 (CYANOCOBALAMIN) 1000 MCG tablet Take 1,000 mcg by mouth daily.   Yes [provider]  ALPRAZolam (XANAX) 0.25 MG tablet Take 0.25 mg by mouth daily as needed for anxiety. 07/12/17   [provider]  cephALEXin (KEFLEX) 500 MG capsule Take 1 capsule (500 mg total) by mouth 4 (four) times daily for 10 days. 01/24/19 02/03/19  Blake Divine, MD  oxyCODONE-acetaminophen (PERCOCET) 5-325 MG tablet Take 1 tablet by mouth every 4 (four) hours as needed for severe pain. 01/24/19 01/24/20  Blake Divine, MD    Allergies Lactose intolerance (gi), Morphine and related, and Vicodin [hydrocodone-acetaminophen]  Family History  Problem Relation Age of Onset  . Breast cancer Neg Hx     Social History Social History   Tobacco Use  . Smoking status: Never Smoker  . Smokeless tobacco: Never Used  Substance Use Topics  . Alcohol use: No  . Drug use: No    Review of Systems  Constitutional: No fever/chills Eyes: No visual changes. ENT: No sore throat. Cardiovascular: Denies chest pain. Respiratory: Denies shortness of breath.  Gastrointestinal: No abdominal pain.  No nausea, no vomiting.  No diarrhea.  No constipation.  Positive for flank pain. Genitourinary: Positive for dysuria. Musculoskeletal: Negative for back pain. Skin: Negative for rash. Neurological: Negative for headaches, focal weakness or numbness.  ____________________________________________   PHYSICAL EXAM:  VITAL SIGNS: ED Triage Vitals [01/24/19 1130]  Enc Vitals Group     BP 136/76     Pulse Rate 61     Resp 16     Temp 98.3 F (36.8 C)     Temp Source Oral     SpO2 96 %     Weight 200 lb (90.7 kg)     Height 5\' 4"  (1.626 m)     Head Circumference      Peak Flow      Pain Score  4     Pain Loc      Pain Edu?      Excl. in [place]?     Constitutional: Alert and oriented. Eyes: Conjunctivae are normal. Head: Atraumatic. Nose: No congestion/rhinnorhea. Mouth/Throat: Mucous membranes are moist. Neck: Normal ROM Cardiovascular: Normal rate, regular rhythm. Grossly normal heart sounds. Respiratory: Normal respiratory effort.  No retractions. Lungs CTAB. Gastrointestinal: Soft and nontender. No distention.  Right CVA tenderness. Genitourinary: deferred Musculoskeletal: No lower extremity tenderness nor edema. Neurologic:  Normal speech and language. No gross focal neurologic deficits are appreciated. Skin:  Skin is warm, dry and intact. No rash noted. Psychiatric: Mood and affect are normal. Speech and behavior are normal.  ____________________________________________   LABS (all labs ordered are listed, but only abnormal results are displayed)  Labs Reviewed  URINALYSIS, COMPLETE (UACMP) WITH MICROSCOPIC - Abnormal; Notable for the following components:      Result Value   Color, Urine YELLOW (*)    APPearance CLOUDY (*)    Hgb urine dipstick SMALL (*)    Protein, ur 100 (*)    Leukocytes,Ua LARGE (*)    Bacteria, UA MANY (*)    All other components within normal limits  BASIC METABOLIC PANEL - Abnormal; Notable for the  following components:   Glucose, Bld 161 (*)    Creatinine, Ser 1.42 (*)    GFR calc non Af Amer 36 (*)    GFR calc Af Amer 42 (*)    All other components within normal limits  HEPATIC FUNCTION PANEL - Abnormal; Notable for the following components:   Total Protein 6.3 (*)    Albumin 3.3 (*)    All other components within normal limits  GLUCOSE, CAPILLARY - Abnormal; Notable for the following components:   Glucose-Capillary 104 (*)    All other components within normal limits  URINE CULTURE  CBC  LIPASE, BLOOD     PROCEDURES  Procedure(s) performed (including Critical Care):  Procedures   ____________________________________________   INITIAL IMPRESSION / ASSESSMENT AND PLAN / ED COURSE       73 year old female with history of kidney stones presents to the ED with 3 days of worsening right flank pain despite starting course of ciprofloxacin.  Patient does not appear systemically ill, vital signs within normal limits and she is afebrile.  Labs thus far are reassuring, no leukocytosis and kidney function comparable to prior.  UA consistent with infection, given lack of improvement with initial antibiotic course, will image for contributing kidney stone.  Will give fluid bolus and treat pain, give dose of Rocephin.  CT negative for obstructing nephrolithiasis, incidentally does show to gallstones in patient's common bile duct.  She has no abdominal pain at this time and her LFTs are unremarkable.  Unable to better visualize these on right upper quadrant ultrasound.  Case discussed with Dr. Alice Reichert of GI, who states this may be dealt with as an outpatient by Dr. Allen Norris, and agrees that her symptoms are much more likely related to pyelonephritis.  Offered patient admission for further management of her pyelonephritis, however she much prefers to be discharged home and to manage symptoms as an outpatient.  Counseled patient on return precautions and need to follow-up with PCP, patient  agrees with plan.      ____________________________________________   FINAL CLINICAL IMPRESSION(S) / ED DIAGNOSES  Final diagnoses:  Flank pain  Choledocholithiasis  Pyelonephritis     ED Discharge Orders         Ordered  oxyCODONE-acetaminophen (PERCOCET) 5-325 MG tablet  Every 4 hours PRN     01/24/19 1806    cephALEXin (KEFLEX) 500 MG capsule  4 times daily     01/24/19 1806           Note:  This document was prepared using Dragon voice recognition software and may include unintentional dictation errors.   Blake Divine, MD 01/24/19 2138

## 2019-01-24 NOTE — Discharge Instructions (Signed)
Please take all antibiotics until done and follow-up closely with your primary care doctor.  Do not hesitate to return to the ED for worsening pain, fever, vomiting, or any other concerning symptoms.  You will also need to schedule follow-up with Dr. Allen Norris of GI for further evaluation of your gallstones.  He will also need to return to the ED if you develop abdominal pain.

## 2019-01-24 NOTE — ED Triage Notes (Signed)
Pt in via POV, reports right flank, dysuria, and urinary frequency since Monday night.  Vitals WDL, NAD noted at this time.

## 2019-01-24 NOTE — ED Notes (Addendum)
This RN at beside, pt reports feelings of "chills" pt stated it felt like when her blood sugar gets low. T- 97.8, BG 104.  Will continue to monitor.

## 2019-01-26 DIAGNOSIS — Z23 Encounter for immunization: Secondary | ICD-10-CM | POA: Diagnosis not present

## 2019-01-27 LAB — URINE CULTURE: Culture: >=100000 — AB

## 2019-01-29 ENCOUNTER — Encounter: Payer: Self-pay | Admitting: Gastroenterology

## 2019-01-29 ENCOUNTER — Ambulatory Visit: Payer: Medicare HMO | Admitting: Gastroenterology

## 2019-01-29 ENCOUNTER — Other Ambulatory Visit: Payer: Self-pay

## 2019-01-29 VITALS — BP 150/84 | HR 64 | Temp 97.5°F | Ht 64.0 in | Wt 209.4 lb

## 2019-01-29 DIAGNOSIS — K8050 Calculus of bile duct without cholangitis or cholecystitis without obstruction: Secondary | ICD-10-CM | POA: Diagnosis not present

## 2019-01-29 DIAGNOSIS — K8020 Calculus of gallbladder without cholecystitis without obstruction: Secondary | ICD-10-CM

## 2019-01-29 NOTE — Patient Instructions (Addendum)
Your MRCP is scheduled for October 14th

## 2019-01-29 NOTE — Progress Notes (Signed)
[name] [address]  [place]  [place], [place] [address]  Main: [phone]  Fax: [phone]   Gastroenterology Consultation  Referring Provider:     Rusty Aus, MD Primary Care Physician:  Rusty Aus, MD Reason for Consultation:    Choledocholithiasis        HPI:    Chief Complaint  Patient presents with   Follow-up    Patient was seen in the ED for RUQ pain. Patient has had some constipation    New Patient (Initial Visit)    [name] is a 73 y.o. y/o female referred for consultation & management  by Dr. Sabra Heck, Christean Grief, MD.  Patient went to the ER recently with right-sided flank pain and is being treated for a UTI.  On her CT scan she was incidentally found to have 2, 3 mm stones in the distal common bile duct and was referred to [place].  Patient denies any abdominal pain or any right upper quadrant pain at any time.  No nausea or vomiting.  Good appetite.  Liver enzymes were always normal.  No prior gallbladder issues.  Past Medical History:  Diagnosis Date   A-fib ([place])    back in 2013, for stent placement in kidney, she had a bout of a-fib.  Now, back in rhythm   Arthritis    Chronic kidney disease    Complication of anesthesia    Diabetes mellitus without complication ([place])    type 2    only takes metformin   Dysuria    History of kidney stones    Hypertension    Osteopenia    PONV (postoperative nausea and vomiting)    Sleep apnea    lost over 120 lbs, and no long uses it (close to 2 yrs now)   Stroke [facility])    mini 2003--affected right side of body.   took 6 weeks to get over.   Urine, incontinence, stress female     Past Surgical History:  Procedure Laterality Date   COLONOSCOPY W/ POLYPECTOMY Right    COLONOSCOPY WITH PROPOFOL N/A 01/09/2015   Procedure: COLONOSCOPY WITH PROPOFOL;  Surgeon: Manya Silvas, MD;  Location: [facility] ENDOSCOPY;  Service: Endoscopy;  Laterality: N/A;   EYE SURGERY     bilateral cataracts with implants   JOINT REPLACEMENT     left knee  TKR   KIDNEY STONE SURGERY     40 yrs ago, while pregnant, she had to 'be cut in 1/2 to get stone out'   KNEE ARTHROSCOPY     left knee   LITHOTRIPSY     ltk Left    LUMBAR LAMINECTOMY/DECOMPRESSION MICRODISCECTOMY N/A 09/06/2016   Procedure: LAMINECTOMY AND FORAMINOTOMY LUMBAR THREE- LUMBAR FOUR, LUMBAR FOUR- LUMBAR FIVE;  Surgeon: Newman Pies, MD;  Location: [place];  Service: Neurosurgery;  Laterality: N/A;  LAMINECTOMY AND FORAMINOTOMY LUMBAR 3- LUMBAR 4, LUMBAR 4- LUMBAR 5    throidectomy      Prior to Admission medications   Medication Sig Start Date End Date Taking? Authorizing Provider  allopurinol (ZYLOPRIM) 100 MG tablet Take 100 mg by mouth daily.   Yes [provider]  amLODipine (NORVASC) 10 MG tablet Take 10 mg by mouth daily.  07/05/16  Yes [provider]  aspirin 81 MG tablet Take 81 mg by mouth at bedtime.    Yes [provider]  cephALEXin (KEFLEX) 500 MG capsule Take 1 capsule (500 mg total) by mouth 4 (four) times  daily for 10 days. 01/24/19 02/03/19 Yes Blake Divine, MD  Dulaglutide (TRULICITY) 7.42 VZ/5.6LO SOPN Inject 0.75 mg into the skin once a week.   Yes [provider]  gabapentin (NEURONTIN) 100 MG capsule Take 100-200 mg by mouth 2 (two) times daily. Take two capsules (200 mg) every morning and one capsule (100 mg) every evening 08/22/18  Yes [provider]  HYDROcodone-acetaminophen (NORCO/VICODIN) 5-325 MG tablet Take 1 tablet by mouth every 6 (six) hours as needed. for pain 01/16/19  Yes [provider]  metFORMIN (GLUCOPHAGE) 500 MG tablet Take 1,000 mg by mouth 2 (two) times daily with a meal.  01/10/19  Yes [provider]  metoprolol (LOPRESSOR) 100 MG tablet Take 100 mg by mouth daily.    Yes [provider]  oxyCODONE-acetaminophen (PERCOCET) 5-325 MG tablet Take 1 tablet by mouth every 4 (four) hours as  needed for severe pain. 01/24/19 01/24/20 Yes Blake Divine, MD  simvastatin (ZOCOR) 20 MG tablet Take 20 mg by mouth at bedtime.    Yes [provider]  vitamin B-12 (CYANOCOBALAMIN) 1000 MCG tablet Take 1,000 mcg by mouth daily.   Yes [provider]    Family History  Problem Relation Age of Onset   Breast cancer Neg Hx      Social History   Tobacco Use   Smoking status: Never Smoker   Smokeless tobacco: Never Used  Substance Use Topics   Alcohol use: No   Drug use: No    Allergies as of 01/29/2019 - Review Complete 01/29/2019  Allergen Reaction Noted   Lactose intolerance (gi)  01/24/2019   Morphine and related Nausea And Vomiting 07/09/2016   Vicodin [hydrocodone-acetaminophen] Nausea And Vomiting 07/09/2016    Review of Systems:    All systems reviewed and negative except where noted in HPI.   Physical Exam:  BP (!) 150/84 (BP Location: Left Arm, Patient Position: Sitting, Cuff Size: Normal)    Pulse 64    Temp (!) 97.5 F (36.4 C) (Oral)    Ht 5\' 4"  (1.626 m)    Wt 209 lb 6 oz (95 kg)    BMI 35.94 kg/m  No LMP recorded. Patient is postmenopausal. Psych:  Alert and cooperative. Normal mood and affect. General:   Alert,  Well-developed, well-nourished, pleasant and cooperative in NAD Head:  Normocephalic and atraumatic. Eyes:  Sclera clear, no icterus.   Conjunctiva pink. Ears:  Normal auditory acuity. Nose:  No deformity, discharge, or lesions. Mouth:  No deformity or lesions,oropharynx pink & moist. Neck:  Supple; no masses or thyromegaly. Abdomen:  Normal bowel sounds.  No bruits.  Soft, non-tender and non-distended without masses, hepatosplenomegaly or hernias noted.  No guarding or rebound tenderness.    Msk:  Symmetrical without gross deformities. Good, equal movement & strength bilaterally. Pulses:  Normal pulses noted. Extremities:  No clubbing or edema.  No cyanosis. Neurologic:  Alert and oriented x3;  grossly normal  neurologically. Skin:  Intact without significant lesions or rashes. No jaundice. Lymph Nodes:  No significant cervical adenopathy. Psych:  Alert and cooperative. Normal mood and affect.   Labs: CBC    Component Value Date/Time   WBC 7.2 01/24/2019 1137   RBC 4.82 01/24/2019 1137   HGB 14.4 01/24/2019 1137   HGB 12.9 08/13/2013 1255   HCT 43.5 01/24/2019 1137   HCT 39.6 08/13/2013 1255   PLT 235 01/24/2019 1137   PLT 390 08/13/2013 1255   MCV 90.2 01/24/2019 1137   MCV 89  08/13/2013 1255   MCH 29.9 01/24/2019 1137   MCHC 33.1 01/24/2019 1137   RDW 13.2 01/24/2019 1137   RDW 14.2 08/13/2013 1255   LYMPHSABS 1.8 08/13/2013 1255   MONOABS 0.7 08/13/2013 1255   EOSABS 0.2 08/13/2013 1255   BASOSABS 0.1 08/13/2013 1255   CMP     Component Value Date/Time   NA 137 01/24/2019 1137   NA 139 08/13/2013 1255   K 4.0 01/24/2019 1137   K 4.1 08/13/2013 1255   CL 105 01/24/2019 1137   CL 104 08/13/2013 1255   CO2 23 01/24/2019 1137   CO2 30 08/13/2013 1255   GLUCOSE 161 (H) 01/24/2019 1137   GLUCOSE 147 (H) 08/13/2013 1255   BUN 22 01/24/2019 1137   BUN 14 08/13/2013 1255   CREATININE 1.42 (H) 01/24/2019 1137   CREATININE 1.62 (H) 08/13/2013 1255   CALCIUM 9.0 01/24/2019 1137   CALCIUM 9.1 08/13/2013 1255   PROT 6.3 (L) 01/24/2019 1157   PROT 6.2 (L) 07/28/2013 1213   ALBUMIN 3.3 (L) 01/24/2019 1157   ALBUMIN 2.9 (L) 07/28/2013 1213   AST 19 01/24/2019 1157   AST 24 07/28/2013 1213   ALT 21 01/24/2019 1157   ALT 24 07/28/2013 1213   ALKPHOS 65 01/24/2019 1157   ALKPHOS 60 07/28/2013 1213   BILITOT 0.8 01/24/2019 1157   BILITOT 1.3 (H) 07/28/2013 1213   GFRNONAA 36 (L) 01/24/2019 1137   GFRNONAA 32 (L) 08/13/2013 1255   GFRAA 42 (L) 01/24/2019 1137   GFRAA 37 (L) 08/13/2013 1255    Imaging Studies: Ct Renal Stone Study  Result Date: 01/24/2019 CLINICAL DATA:  Flank pain, recurrent stone disease EXAM: CT ABDOMEN AND PELVIS WITHOUT CONTRAST TECHNIQUE:  Multidetector CT imaging of the abdomen and pelvis was performed following the standard protocol without IV contrast. COMPARISON:  07/28/2013 FINDINGS: Lower chest: No acute abnormality. Hepatobiliary: Liver is normal. Normal gallbladder. Two 3 mm gallstones in the distal common bile duct. Pancreas: Unremarkable. No pancreatic ductal dilatation or surrounding inflammatory changes. Spleen: Normal in size without focal abnormality. Adrenals/Urinary Tract: Thickening of the left adrenal gland. Normal right adrenal gland. Atrophic bilateral kidneys. Bilateral nonobstructing renal calculi. No obstructive uropathy. Small amount air in the bladder which may be secondary to instrumentation versus cystitis. Stomach/Bowel: Stomach is within normal limits. Appendix appears normal. No evidence of bowel wall thickening, distention, or inflammatory changes. Vascular/Lymphatic: Normal caliber abdominal aorta with mild atherosclerosis. No lymphadenopathy. Reproductive: Small calcified uterine fibroids.  No adnexal masses. Other: No abdominal wall hernia or abnormality. No abdominopelvic ascites. Musculoskeletal: No acute osseous abnormality. No aggressive osseous lesion. Diffuse thoracolumbar spine spondylosis. IMPRESSION: 1. Choledocholithiasis with two 3 mm gallstones in the distal common bile duct. 2. Multiple bilateral nonobstructing renal calculi. 3. Small amount air in the bladder which may be secondary to instrumentation versus cystitis. 4.  Aortic Atherosclerosis (ICD10-I70.0). Electronically Signed   By: Kathreen Devoid   On: 01/24/2019 13:45   US Abdomen Limited Ruq  Result Date: 01/24/2019 CLINICAL DATA:  Right upper quadrant and flank pain for the past 3 days. EXAM: ULTRASOUND ABDOMEN LIMITED RIGHT UPPER QUADRANT COMPARISON:  CT abdomen pelvis from same day. FINDINGS: Gallbladder: No gallstones or wall thickening visualized. No sonographic Murphy sign noted by sonographer. Common bile duct: Diameter: 6 mm. The distal  common bile duct is not visualized due to overlying bowel gas. Liver: No focal lesion identified. Increased in parenchymal echogenicity. Portal vein is patent on color Doppler imaging with normal direction of blood  flow towards the liver. Other: None. IMPRESSION: 1. No acute abnormality. The distal common bile duct stones seen on CT today are not visualized due to overlying bowel gas. No biliary dilatation. Electronically Signed   By: Titus Dubin M.D.   On: 01/24/2019 16:23    Assessment and Plan:   Belanna Manring is a 73 y.o. y/o female has been referred for incidental finding of small stone seen in the distal common bile duct on a CT done for right-sided flank pain in the setting of UTI  Patient is completely asymptomatic with normal liver enzymes.  No evidence of obstruction at this time I discussed given the stones are small, they are likely to pass on their own.  However, we discussed need for MRCP to evaluate if the stones are still present and if so may need to be removed via ERCP.  Patient would like an open MRI done and will schedule with [facility] imaging  If symptoms occur such as right upper quadrant pain, nausea or vomiting or any other reason for concern patient was asked to notify us and verbalized understanding  Patient states she follows with Dr. Sabra Heck in regard to her UTI.  Also reports previous history of colonoscopy.  Last one was in 2016 with Dr. Vira Agar and one polyp was removed with granulation type tissue.  Patient is not interested in repeat colonoscopies for history of polyps.  Dr [name]  Speech recognition software was used to dictate the above note.

## 2019-02-12 DIAGNOSIS — M5489 Other dorsalgia: Secondary | ICD-10-CM | POA: Diagnosis not present

## 2019-02-12 DIAGNOSIS — N3090 Cystitis, unspecified without hematuria: Secondary | ICD-10-CM | POA: Diagnosis not present

## 2019-02-12 DIAGNOSIS — G8929 Other chronic pain: Secondary | ICD-10-CM | POA: Diagnosis not present

## 2019-02-12 DIAGNOSIS — K8050 Calculus of bile duct without cholangitis or cholecystitis without obstruction: Secondary | ICD-10-CM | POA: Diagnosis not present

## 2019-02-21 ENCOUNTER — Other Ambulatory Visit: Payer: Self-pay

## 2019-02-21 ENCOUNTER — Ambulatory Visit
Admission: RE | Admit: 2019-02-21 | Discharge: 2019-02-21 | Disposition: A | Discharge status: Home / Self Care | Payer: Medicare HMO | Source: Ambulatory Visit | Attending: Gastroenterology | Admitting: Gastroenterology

## 2019-02-21 DIAGNOSIS — K8020 Calculus of gallbladder without cholecystitis without obstruction: Secondary | ICD-10-CM

## 2019-02-21 DIAGNOSIS — K8070 Calculus of gallbladder and bile duct without cholecystitis without obstruction: Secondary | ICD-10-CM | POA: Diagnosis not present

## 2019-02-21 DIAGNOSIS — K8050 Calculus of bile duct without cholangitis or cholecystitis without obstruction: Secondary | ICD-10-CM

## 2019-02-21 DIAGNOSIS — K869 Disease of pancreas, unspecified: Secondary | ICD-10-CM | POA: Diagnosis not present

## 2019-02-21 MED ORDER — GADOBENATE DIMEGLUMINE 529 MG/ML IV SOLN
10.0000 mL | Freq: Once | INTRAVENOUS | Status: AC | PRN
  Administered 2019-02-21: 10 mL via INTRAVENOUS

## 2019-02-26 DIAGNOSIS — R8271 Bacteriuria: Secondary | ICD-10-CM | POA: Diagnosis not present

## 2019-03-06 ENCOUNTER — Other Ambulatory Visit: Payer: Self-pay

## 2019-03-06 DIAGNOSIS — K839 Disease of biliary tract, unspecified: Secondary | ICD-10-CM

## 2019-03-07 ENCOUNTER — Other Ambulatory Visit: Payer: Self-pay

## 2019-03-12 ENCOUNTER — Other Ambulatory Visit
Admission: RE | Admit: 2019-03-12 | Discharge: 2019-03-12 | Disposition: A | Discharge status: Home / Self Care | Payer: Medicare HMO | Source: Ambulatory Visit | Attending: Gastroenterology | Admitting: Gastroenterology

## 2019-03-12 ENCOUNTER — Other Ambulatory Visit: Payer: Self-pay

## 2019-03-12 DIAGNOSIS — Z01812 Encounter for preprocedural laboratory examination: Secondary | ICD-10-CM | POA: Diagnosis not present

## 2019-03-12 DIAGNOSIS — Z20828 Contact with and (suspected) exposure to other viral communicable diseases: Secondary | ICD-10-CM | POA: Diagnosis not present

## 2019-03-12 LAB — SARS CORONAVIRUS 2 (TAT 6-24 HRS): SARS Coronavirus 2: NEGATIVE

## 2019-03-15 ENCOUNTER — Ambulatory Visit: Payer: Medicare HMO | Admitting: Certified Registered"

## 2019-03-15 ENCOUNTER — Encounter: Payer: Self-pay | Admitting: Emergency Medicine

## 2019-03-15 ENCOUNTER — Other Ambulatory Visit: Payer: Self-pay

## 2019-03-15 ENCOUNTER — Ambulatory Visit: Payer: Medicare HMO

## 2019-03-15 ENCOUNTER — Telehealth: Payer: Self-pay

## 2019-03-15 ENCOUNTER — Ambulatory Visit
Admission: RE | Admit: 2019-03-15 | Discharge: 2019-03-15 | Disposition: A | Discharge status: Home / Self Care | Payer: Medicare HMO | Attending: Gastroenterology | Admitting: Gastroenterology

## 2019-03-15 ENCOUNTER — Encounter
Admission: RE | Disposition: A | Discharge status: Home / Self Care | Payer: Self-pay | Source: Home / Self Care | Attending: Gastroenterology

## 2019-03-15 DIAGNOSIS — G4730 Sleep apnea, unspecified: Secondary | ICD-10-CM | POA: Diagnosis not present

## 2019-03-15 DIAGNOSIS — I69351 Hemiplegia and hemiparesis following cerebral infarction affecting right dominant side: Secondary | ICD-10-CM | POA: Insufficient documentation

## 2019-03-15 DIAGNOSIS — Z87442 Personal history of urinary calculi: Secondary | ICD-10-CM | POA: Insufficient documentation

## 2019-03-15 DIAGNOSIS — K8050 Calculus of bile duct without cholangitis or cholecystitis without obstruction: Secondary | ICD-10-CM | POA: Diagnosis not present

## 2019-03-15 DIAGNOSIS — E1122 Type 2 diabetes mellitus with diabetic chronic kidney disease: Secondary | ICD-10-CM | POA: Diagnosis not present

## 2019-03-15 DIAGNOSIS — I739 Peripheral vascular disease, unspecified: Secondary | ICD-10-CM | POA: Insufficient documentation

## 2019-03-15 DIAGNOSIS — Z86010 Personal history of colonic polyps: Secondary | ICD-10-CM | POA: Diagnosis not present

## 2019-03-15 DIAGNOSIS — E739 Lactose intolerance, unspecified: Secondary | ICD-10-CM | POA: Diagnosis not present

## 2019-03-15 DIAGNOSIS — Z96652 Presence of left artificial knee joint: Secondary | ICD-10-CM | POA: Diagnosis not present

## 2019-03-15 DIAGNOSIS — I129 Hypertensive chronic kidney disease with stage 1 through stage 4 chronic kidney disease, or unspecified chronic kidney disease: Secondary | ICD-10-CM | POA: Insufficient documentation

## 2019-03-15 DIAGNOSIS — Z79899 Other long term (current) drug therapy: Secondary | ICD-10-CM | POA: Insufficient documentation

## 2019-03-15 DIAGNOSIS — N393 Stress incontinence (female) (male): Secondary | ICD-10-CM | POA: Diagnosis not present

## 2019-03-15 DIAGNOSIS — N189 Chronic kidney disease, unspecified: Secondary | ICD-10-CM | POA: Diagnosis not present

## 2019-03-15 DIAGNOSIS — Z885 Allergy status to narcotic agent status: Secondary | ICD-10-CM | POA: Diagnosis not present

## 2019-03-15 DIAGNOSIS — Z7982 Long term (current) use of aspirin: Secondary | ICD-10-CM | POA: Diagnosis not present

## 2019-03-15 DIAGNOSIS — Z9689 Presence of other specified functional implants: Secondary | ICD-10-CM | POA: Diagnosis not present

## 2019-03-15 DIAGNOSIS — M8580 Other specified disorders of bone density and structure, unspecified site: Secondary | ICD-10-CM | POA: Diagnosis not present

## 2019-03-15 DIAGNOSIS — Z7984 Long term (current) use of oral hypoglycemic drugs: Secondary | ICD-10-CM | POA: Diagnosis not present

## 2019-03-15 DIAGNOSIS — K829 Disease of gallbladder, unspecified: Secondary | ICD-10-CM | POA: Diagnosis not present

## 2019-03-15 DIAGNOSIS — K839 Disease of biliary tract, unspecified: Secondary | ICD-10-CM

## 2019-03-15 DIAGNOSIS — K219 Gastro-esophageal reflux disease without esophagitis: Secondary | ICD-10-CM | POA: Insufficient documentation

## 2019-03-15 DIAGNOSIS — E119 Type 2 diabetes mellitus without complications: Secondary | ICD-10-CM | POA: Diagnosis not present

## 2019-03-15 DIAGNOSIS — M1990 Unspecified osteoarthritis, unspecified site: Secondary | ICD-10-CM | POA: Insufficient documentation

## 2019-03-15 HISTORY — PX: ERCP: SHX5425

## 2019-03-15 LAB — GLUCOSE, CAPILLARY: Glucose-Capillary: 142 mg/dL — ABNORMAL HIGH (ref 70–99)

## 2019-03-15 SURGERY — ERCP, WITH INTERVENTION IF INDICATED
Anesthesia: General

## 2019-03-15 MED ORDER — SODIUM CHLORIDE 0.9 % IV SOLN: INTRAVENOUS | Status: DC

## 2019-03-15 MED ORDER — PROPOFOL 10 MG/ML IV BOLUS
INTRAVENOUS | Status: DC | PRN
  Administered 2019-03-15: 150 mg via INTRAVENOUS

## 2019-03-15 MED ORDER — GLYCOPYRROLATE 0.2 MG/ML IJ SOLN
INTRAMUSCULAR | Status: DC | PRN
  Administered 2019-03-15: 0.2 mg via INTRAVENOUS

## 2019-03-15 MED ORDER — INDOMETHACIN 50 MG RE SUPP
100.0000 mg | Freq: Once | RECTAL | Status: AC
  Administered 2019-03-15: 100 mg via RECTAL

## 2019-03-15 MED ORDER — LIDOCAINE HCL (CARDIAC) PF 100 MG/5ML IV SOSY
PREFILLED_SYRINGE | INTRAVENOUS | Status: DC | PRN
  Administered 2019-03-15: 100 mg via INTRATRACHEAL

## 2019-03-15 MED ORDER — ONDANSETRON HCL 4 MG/2ML IJ SOLN
INTRAMUSCULAR | Status: DC | PRN
  Administered 2019-03-15: 4 mg via INTRAVENOUS

## 2019-03-15 MED ORDER — ONDANSETRON HCL 4 MG/2ML IJ SOLN
4.0000 mg | Freq: Once | INTRAMUSCULAR | Status: AC | PRN
  Administered 2019-03-15: 12:00:00 4 mg via INTRAVENOUS

## 2019-03-15 MED ORDER — FENTANYL CITRATE (PF) 100 MCG/2ML IJ SOLN: 25.0000 ug | INTRAMUSCULAR | Status: DC | PRN

## 2019-03-15 MED ORDER — SUCCINYLCHOLINE CHLORIDE 20 MG/ML IJ SOLN
INTRAMUSCULAR | Status: DC | PRN
  Administered 2019-03-15: 100 mg via INTRAVENOUS

## 2019-03-15 MED ORDER — PHENYLEPHRINE HCL (PRESSORS) 10 MG/ML IV SOLN
INTRAVENOUS | Status: DC | PRN
  Administered 2019-03-15: 100 ug via INTRAVENOUS

## 2019-03-15 MED ORDER — ROCURONIUM BROMIDE 100 MG/10ML IV SOLN
INTRAVENOUS | Status: DC | PRN
  Administered 2019-03-15: 40 mg via INTRAVENOUS

## 2019-03-15 MED ORDER — ONDANSETRON HCL 4 MG/2ML IJ SOLN
INTRAMUSCULAR | Status: AC
  Filled 2019-03-15: qty 2

## 2019-03-15 MED ORDER — DEXAMETHASONE SODIUM PHOSPHATE 10 MG/ML IJ SOLN
INTRAMUSCULAR | Status: DC | PRN
  Administered 2019-03-15: 10 mg via INTRAVENOUS

## 2019-03-15 MED ORDER — SUGAMMADEX SODIUM 500 MG/5ML IV SOLN
INTRAVENOUS | Status: DC | PRN
  Administered 2019-03-15: 400 mg via INTRAVENOUS

## 2019-03-15 MED ORDER — INDOMETHACIN 50 MG RE SUPP
RECTAL | Status: AC
  Filled 2019-03-15: qty 2

## 2019-03-15 MED ORDER — LACTATED RINGERS IV SOLN
INTRAVENOUS | Status: DC
  Administered 2019-03-15: 1000 mL via INTRAVENOUS

## 2019-03-15 NOTE — Telephone Encounter (Signed)
-----   Message from Virgel Manifold, MD sent at 03/15/2019 11:08 AM EST ----- Please refer to Dr. Hampton Abbot for evaluation for cholecystectomy. Please let patient know this is to prevent future episodes of stones in the CBD

## 2019-03-15 NOTE — Anesthesia Preprocedure Evaluation (Addendum)
Anesthesia Evaluation  Patient identified by MRN, date of birth, ID band Patient awake    Reviewed: Allergy & Precautions, H&P , NPO status , Patient's Chart, lab work & pertinent test results, reviewed documented beta blocker date and time   History of Anesthesia Complications (+) PONV and history of anesthetic complications  Airway Mallampati: IV  TM Distance: >3 FB Neck ROM: full    Dental  (+) Missing, Partial Upper, Dental Advidsory Given   Pulmonary neg shortness of breath, sleep apnea (history, but resolved since 100lb weight loss) , neg COPD, neg recent URI,    Pulmonary exam normal        Cardiovascular Exercise Tolerance: Good hypertension, (-) angina+ Peripheral Vascular Disease  (-) CAD, (-) Past MI, (-) Cardiac Stents and (-) CABG Normal cardiovascular exam+ dysrhythmias (1 episode after a prior anesthetic, none since that time) Atrial Fibrillation (-) Valvular Problems/Murmurs     Neuro/Psych neg Seizures CVA (right sided weakness), Residual Symptoms negative psych ROS   GI/Hepatic Neg liver ROS, GERD  Controlled,  Endo/Other  diabetes  Renal/GU CRFRenal disease  negative genitourinary   Musculoskeletal   Abdominal   Peds  Hematology negative hematology ROS (+)   Anesthesia Other Findings Past Medical History:   Hypertension                                                 Stroke                                                       Arthritis                                                    Diabetes mellitus without complication                       Chronic kidney disease                                       Osteopenia                                                   Sleep apnea                                                  Dysuria                                                      Urine, incontinence, stress female  Adenomatous colon polyp                                       Reproductive/Obstetrics negative OB ROS                             Anesthesia Physical  Anesthesia Plan  ASA: III  Anesthesia Plan: General   Post-op Pain Management:    Induction: Intravenous  PONV Risk Score and Plan: 4 or greater and Ondansetron, Dexamethasone and Treatment may vary due to age or medical condition  Airway Management Planned: Oral ETT  Additional Equipment:   Intra-op Plan:   Post-operative Plan: Extubation in OR  Informed Consent: I have reviewed the patients History and Physical, chart, labs and discussed the procedure including the risks, benefits and alternatives for the proposed anesthesia with the patient or authorized representative who has indicated his/her understanding and acceptance.     Dental Advisory Given  Plan Discussed with: Anesthesiologist, CRNA and Surgeon  Anesthesia Plan Comments:        Anesthesia Quick Evaluation

## 2019-03-15 NOTE — Op Note (Signed)
[facility] Gastroenterology Patient Name: [name] Procedure Date: 03/15/2019 10:24 AM MRN: [id number] Account #: [id number] Date of Birth: 1945-06-30 Admit Type: Outpatient Age: 73 Room: [facility] ENDO ROOM 4 Gender: Female Note Status: Finalized Procedure:             ERCP Indications:           Common bile duct stone(s) Providers:             Lucilla Lame MD, MD Referring MD:          Rusty Aus, MD (Referring MD) Medicines:             General Anesthesia Complications:         No immediate complications. Procedure:             Pre-Anesthesia Assessment:                        - Prior to the procedure, a History and Physical was                         performed, and patient medications and allergies were                         reviewed. The patient's tolerance of previous                         anesthesia was also reviewed. The risks and benefits                         of the procedure and the sedation options and risks                         were discussed with the patient. All questions were                         answered, and informed consent was obtained. Prior                         Anticoagulants: The patient has taken no previous                         anticoagulant or antiplatelet agents. ASA Grade                         Assessment: II - A patient with mild systemic disease.                         After reviewing the risks and benefits, the patient                         was deemed in satisfactory condition to undergo the                         procedure.                        After obtaining informed consent, the scope was passed  under direct vision. Throughout the procedure, the                         patient's blood pressure, pulse, and oxygen                         saturations were monitored continuously. The                         Duodenoscope was introduced through the mouth, and      used to inject contrast into and used to cannulate the                         bile duct. The ERCP was accomplished without                         difficulty. The patient tolerated the procedure well. Findings:      The scout film was normal. The esophagus was successfully intubated       under direct vision. The scope was advanced to a normal major papilla in       the descending duodenum without detailed examination of the pharynx,       larynx and associated structures, and upper GI tract. The upper GI tract       was grossly normal. The bile duct was deeply cannulated with the       short-nosed traction sphincterotome. Contrast was injected. I personally       interpreted the bile duct images. There was brisk flow of contrast       through the ducts. Image quality was excellent. Contrast extended to the       entire biliary tree. The lower third of the main bile duct contained one       stone. A wire was passed into the biliary tree. A 7 mm biliary       sphincterotomy was made with a traction (standard) sphincterotome using       ERBE electrocautery. There was no post-sphincterotomy bleeding. The       biliary tree was swept with a 15 mm balloon starting at the bifurcation.       One stone was removed. No stones remained. Impression:            - Choledocholithiasis was found. Complete removal was                         accomplished by biliary sphincterotomy and balloon                         extraction.                        - A biliary sphincterotomy was performed.                        - The biliary tree was swept. Recommendation:        - Discharge patient to home.                        - Clear liquid diet today.                        -  Refer to a surgeon at appointment to be scheduled.                        - Watch for pancreatitis, bleeding, perforation, and                         cholangitis. Procedure Code(s):     --- Professional ---                         [phone], Endoscopic retrograde cholangiopancreatography                         (ERCP); with removal of calculi/debris from                         biliary/pancreatic duct(s)                        [id number], Endoscopic retrograde cholangiopancreatography                         (ERCP); with sphincterotomy/papillotomy                        [phone], Endoscopic catheterization of the biliary                         ductal system, radiological supervision and                         interpretation Diagnosis Code(s):     --- Professional ---                        K80.50, Calculus of bile duct without cholangitis or                         cholecystitis without obstruction CPT copyright 2019 American Medical Association. All rights reserved. The codes documented in this report are preliminary and upon coder review may  be revised to meet current compliance requirements. Lucilla Lame MD, MD 03/15/2019 11:11:32 AM This report has been signed electronically. Number of Addenda: 0 Note Initiated On: 03/15/2019 10:24 AM Estimated Blood Loss:  Estimated blood loss: none.      [facility]

## 2019-03-15 NOTE — Progress Notes (Signed)
Nausea better   Taking ice chips

## 2019-03-15 NOTE — Telephone Encounter (Signed)
Did referral and informed patient and patient verbalized understanding

## 2019-03-15 NOTE — H&P (Signed)
Lucilla Lame, MD [address] [place], [place] [address] Phone:[phone] Fax : [phone]  Primary Care Physician:  Rusty Aus, MD Primary Gastroenterologist:  Dr. Allen Norris  Pre-Procedure History & Physical: HPI:  Geraldean Walen is a 73 y.o. female is here for an ERCP.   Past Medical History:  Diagnosis Date  . A-fib ([place])    back in 2013, for stent placement in kidney, she had a bout of a-fib.  Now, back in rhythm  . Arthritis   . Chronic kidney disease   . Complication of anesthesia   . Diabetes mellitus without complication ([place])    type 2    only takes metformin  . Dysuria   . History of kidney stones   . Hypertension   . Osteopenia   . PONV (postoperative nausea and vomiting)   . Sleep apnea    lost over 120 lbs, and no long uses it (close to 2 yrs now)  . Stroke [facility])    mini 2003--affected right side of body.   took 6 weeks to get over.  . Urine, incontinence, stress female     Past Surgical History:  Procedure Laterality Date  . COLONOSCOPY W/ POLYPECTOMY Right   . COLONOSCOPY WITH PROPOFOL N/A 01/09/2015   Procedure: COLONOSCOPY WITH PROPOFOL;  Surgeon: Manya Silvas, MD;  Location: [facility] ENDOSCOPY;  Service: Endoscopy;  Laterality: N/A;  . EYE SURGERY     bilateral cataracts with implants  . JOINT REPLACEMENT     left knee  TKR  . KIDNEY STONE SURGERY     40 yrs ago, while pregnant, she had to 'be cut in 1/2 to get stone out'  . KNEE ARTHROSCOPY     left knee  . LITHOTRIPSY    . ltk Left   . LUMBAR LAMINECTOMY/DECOMPRESSION MICRODISCECTOMY N/A 09/06/2016   Procedure: LAMINECTOMY AND FORAMINOTOMY LUMBAR THREE- LUMBAR FOUR, LUMBAR FOUR- LUMBAR FIVE;  Surgeon: Newman Pies, MD;  Location: [facility];  Service: Neurosurgery;  Laterality: N/A;  LAMINECTOMY AND FORAMINOTOMY LUMBAR 3- LUMBAR 4, LUMBAR 4- LUMBAR 5   . throidectomy      Prior to Admission medications   Medication Sig Start Date End Date Taking? Authorizing Provider   allopurinol (ZYLOPRIM) 100 MG tablet Take 100 mg by mouth daily.   Yes [provider]  amLODipine (NORVASC) 10 MG tablet Take 10 mg by mouth daily.  07/05/16  Yes [provider]  aspirin 81 MG tablet Take 81 mg by mouth at bedtime.    Yes [provider]  Dulaglutide (TRULICITY) 6.22 WL/7.9GX SOPN Inject 0.75 mg into the skin once a week.   Yes [provider]  gabapentin (NEURONTIN) 100 MG capsule Take 100-200 mg by mouth 2 (two) times daily. Take two capsules (200 mg) every morning and one capsule (100 mg) every evening 08/22/18  Yes [provider]  HYDROcodone-acetaminophen (NORCO/VICODIN) 5-325 MG tablet Take 1 tablet by mouth every 6 (six) hours as needed. for pain 01/16/19  Yes [provider]  metFORMIN (GLUCOPHAGE) 500 MG tablet Take 1,000 mg by mouth 2 (two) times daily with a meal.  01/10/19  Yes [provider]  metoprolol (LOPRESSOR) 100 MG tablet Take 100 mg by mouth daily.    Yes [provider]  oxyCODONE-acetaminophen (PERCOCET) 5-325 MG tablet Take 1 tablet by mouth every 4 (four) hours as needed for severe pain. 01/24/19 01/24/20 Yes Blake Divine, MD  simvastatin (ZOCOR) 20 MG tablet Take 20 mg by  mouth at bedtime.    Yes [provider]  vitamin B-12 (CYANOCOBALAMIN) 1000 MCG tablet Take 1,000 mcg by mouth daily.   Yes [provider]    Allergies as of 03/07/2019 - Review Complete 01/29/2019  Allergen Reaction Noted  . Lactose intolerance (gi)  01/24/2019  . Morphine and related Nausea And Vomiting 07/09/2016  . Vicodin [hydrocodone-acetaminophen] Nausea And Vomiting 07/09/2016    Family History  Problem Relation Age of Onset  . Breast cancer Neg Hx     Social History   Socioeconomic History  . Marital status: Married    Spouse name: Not on file  . Number of children: Not on file  . Years of education: Not on file  . Highest education level: Not on file  Occupational History   . Not on file  Social Needs  . Financial resource strain: Not on file  . Food insecurity    Worry: Not on file    Inability: Not on file  . Transportation needs    Medical: Not on file    Non-medical: Not on file  Tobacco Use  . Smoking status: Never Smoker  . Smokeless tobacco: Never Used  Substance and Sexual Activity  . Alcohol use: No  . Drug use: No  . Sexual activity: Not on file  Lifestyle  . Physical activity    Days per week: Not on file    Minutes per session: Not on file  . Stress: Not on file  Relationships  . Social Herbalist on phone: Not on file    Gets together: Not on file    Attends religious service: Not on file    Active member of club or organization: Not on file    Attends meetings of clubs or organizations: Not on file    Relationship status: Not on file  . Intimate partner violence    Fear of current or ex partner: Not on file    Emotionally abused: Not on file    Physically abused: Not on file    Forced sexual activity: Not on file  Other Topics Concern  . Not on file  Social History Narrative  . Not on file    Review of Systems: See HPI, otherwise negative ROS  Physical Exam: BP (!) 157/85   Pulse (!) 56   Temp 97.7 F (36.5 C) (Skin)   Resp 18   Ht 5\' 4"  (1.626 m)   Wt 90.7 kg   SpO2 99%   BMI 34.33 kg/m  General:   Alert,  pleasant and cooperative in NAD Head:  Normocephalic and atraumatic. Neck:  Supple; no masses or thyromegaly. Lungs:  Clear throughout to auscultation.    Heart:  Regular rate and rhythm. Abdomen:  Soft, nontender and nondistended. Normal bowel sounds, without guarding, and without rebound.   Neurologic:  Alert and  oriented x4;  grossly normal neurologically.  Impression/Plan: Lakishia Bourassa is here for an ERCP to be performed for CBD stone  Risks, benefits, limitations, and alternatives regarding  colonoscopy have been reviewed with the patient.  Questions have been answered.  All  parties agreeable.   Lucilla Lame, MD  03/15/2019, 10:05 AM

## 2019-03-15 NOTE — Progress Notes (Signed)
zofran given for nausea  

## 2019-03-15 NOTE — Anesthesia Postprocedure Evaluation (Signed)
Anesthesia Post Note  Patient: [name]  Procedure(s) Performed: ENDOSCOPIC RETROGRADE CHOLANGIOPANCREATOGRAPHY (ERCP) (N/A )  Patient location during evaluation: Endoscopy Anesthesia Type: General Level of consciousness: awake and alert Pain management: pain level controlled Vital Signs Assessment: post-procedure vital signs reviewed and stable Respiratory status: spontaneous breathing, nonlabored ventilation, respiratory function stable and patient connected to nasal cannula oxygen Cardiovascular status: blood pressure returned to baseline and stable Postop Assessment: no apparent nausea or vomiting Anesthetic complications: no     Last Vitals:  Vitals:   03/15/19 1201 03/15/19 1206  BP:  (!) 169/93  Pulse: 68 68  Resp: 14 (!) 23  Temp: 36.6 C   SpO2: 99% 99%    Last Pain:  Vitals:   03/15/19 1206  TempSrc:   PainSc: 0-No pain                 Martha Clan

## 2019-03-15 NOTE — Anesthesia Post-op Follow-up Note (Signed)
Anesthesia QCDR form completed.        

## 2019-03-15 NOTE — Anesthesia Procedure Notes (Signed)
Procedure Name: Intubation Performed by: Fletcher-Harrison, Tavone Caesar, CRNA Pre-anesthesia Checklist: Patient identified, Emergency Drugs available, Suction available and Patient being monitored Patient Re-evaluated:Patient Re-evaluated prior to induction Oxygen Delivery Method: Circle system utilized Preoxygenation: Pre-oxygenation with 100% oxygen Induction Type: IV induction Ventilation: Mask ventilation without difficulty Laryngoscope Size: McGraph and 3 Grade View: Grade I Tube type: Oral Tube size: 7.0 mm Number of attempts: 1 Airway Equipment and Method: Stylet Placement Confirmation: ETT inserted through vocal cords under direct vision,  positive ETCO2,  CO2 detector and breath sounds checked- equal and bilateral Secured at: 21 cm Tube secured with: Tape Dental Injury: Teeth and Oropharynx as per pre-operative assessment        

## 2019-03-15 NOTE — Transfer of Care (Signed)
Immediate Anesthesia Transfer of Care Note  Patient: [name]  Procedure(s) Performed: ENDOSCOPIC RETROGRADE CHOLANGIOPANCREATOGRAPHY (ERCP) (N/A )  Patient Location: PACU  Anesthesia Type:General  Level of Consciousness: awake, alert  and patient cooperative  Airway & Oxygen Therapy: Patient Spontanous Breathing and Patient connected to face mask oxygen  Post-op Assessment: Report given to RN and Post -op Vital signs reviewed and stable  Post vital signs: Reviewed and stable  Last Vitals:  Vitals Value Taken Time  BP 126/110 03/15/19 1124  Temp    Pulse 73 03/15/19 1126  Resp 19 03/15/19 1126  SpO2 99 % 03/15/19 1126  Vitals shown include unvalidated device data.  Last Pain:  Vitals:   03/15/19 0949  TempSrc: Skin  PainSc: 4          Complications: No apparent anesthesia complications

## 2019-03-16 ENCOUNTER — Encounter: Payer: Self-pay | Admitting: Gastroenterology

## 2019-03-20 ENCOUNTER — Other Ambulatory Visit: Payer: Self-pay

## 2019-03-20 ENCOUNTER — Ambulatory Visit (INDEPENDENT_AMBULATORY_CARE_PROVIDER_SITE_OTHER): Payer: Medicare HMO | Admitting: Surgery

## 2019-03-20 ENCOUNTER — Encounter: Payer: Self-pay | Admitting: Surgery

## 2019-03-20 VITALS — BP 131/82 | HR 62 | Temp 91.6°F | Ht 64.0 in | Wt 202.6 lb

## 2019-03-20 DIAGNOSIS — K8050 Calculus of bile duct without cholangitis or cholecystitis without obstruction: Secondary | ICD-10-CM | POA: Diagnosis not present

## 2019-03-20 NOTE — Patient Instructions (Addendum)
Patient is to give our office a call when she would like to move forward with surgery. Patient is recommended to eat a low-fat diet and avoid greasy and spicy foods. Patient is to watch our for any new symtoms, she was informed to contact our office.  Cholelithiasis - Cholelithiasis is also called "gallstones." It is a kind of gallbladder disease. The gallbladder is an organ that stores a liquid (bile) that helps you digest fat. Gallstones may not cause symptoms (may be silent gallstones) until they cause a blockage, and then they can cause pain (gallbladder attack). Follow these instructions at home:  Take over-the-counter and prescription medicines only as told by your doctor.  Stay at a healthy weight.  Eat healthy foods. This includes: ? Eating fewer fatty foods, like fried foods. ? Eating fewer refined carbs (refined carbohydrates). Refined carbs are breads and grains that are highly processed, like white bread and white rice. Instead, choose whole grains like whole-wheat bread and brown rice. ? Eating more fiber. Almonds, fresh fruit, and beans are healthy sources of fiber.  Keep all follow-up visits as told by your doctor. This is important. Contact a doctor if:  You have sudden pain in the upper right side of your belly (abdomen). Pain might spread to your right shoulder or your chest. This may be a sign of a gallbladder attack.  You feel sick to your stomach (are nauseous).  You throw up (vomit).  You have been diagnosed with gallstones that have no symptoms and you get: ? Belly pain. ? Discomfort, burning, or fullness in the upper part of your belly (indigestion). Get help right away if:  You have sudden pain in the upper right side of your belly, and it lasts for more than 2 hours.  You have belly pain that lasts for more than 5 hours.  You have a fever or chills.  You keep feeling sick to your stomach or you keep throwing up.  Your skin or the whites of your eyes  turn yellow (jaundice).  You have dark-colored pee (urine).  You have light-colored poop (stool). Summary  Cholelithiasis is also called "gallstones."  The gallbladder is an organ that stores a liquid (bile) that helps you digest fat.  Silent gallstones are gallstones that do not cause symptoms.  A gallbladder attack may cause sudden pain in the upper right side of your belly. Pain might spread to your right shoulder or your chest. If this happens, contact your doctor.  If you have sudden pain in the upper right side of your belly that lasts for more than 2 hours, get help right away. This information is not intended to replace advice given to you by your health care provider. Make sure you discuss any questions you have with your health care provider. Document Released: 10/13/2007 Document Revised: 04/08/2017 Document Reviewed: 01/11/2016 Elsevier Patient Education  2020 [place].  [place] If you have a gallbladder condition, you may have trouble digesting fats. Eating a low-fat diet can help reduce your symptoms, and may be helpful before and after having surgery to remove your gallbladder (cholecystectomy). Your health care provider may recommend that you work with a diet and nutrition specialist (dietitian) to help you reduce the amount of fat in your diet. What are tips for following this plan? General guidelines  Limit your fat intake to less than 30% of your total daily calories. If you eat around 1,800 calories each day, this is less than 60 grams (g) of  fat per day.  Fat is an important part of a healthy diet. Eating a low-fat diet can make it hard to maintain a healthy body weight. Ask your dietitian how much fat, calories, and other nutrients you need each day.  Eat small, frequent meals throughout the day instead of three large meals.  Drink at least 8-10 cups of fluid a day. Drink enough fluid to keep your urine clear or pale yellow.  Limit alcohol  intake to no more than 1 drink a day for nonpregnant women and 2 drinks a day for men. One drink equals 12 oz of beer, 5 oz of wine, or 1 oz of hard liquor. Reading food labels  Check Nutrition Facts on food labels for the amount of fat per serving. Choose foods with less than 3 grams of fat per serving. Shopping  Choose nonfat and low-fat healthy foods. Look for the words "nonfat," "low fat," or "fat free."  Avoid buying processed or prepackaged foods. Cooking  Cook using low-fat methods, such as baking, broiling, grilling, or boiling.  Cook with small amounts of healthy fats, such as olive oil, grapeseed oil, canola oil, or sunflower oil. What foods are recommended?   All fresh, frozen, or canned fruits and vegetables.  Whole grains.  Low-fat or non-fat (skim) milk and yogurt.  Lean meat, skinless poultry, fish, eggs, and beans.  Low-fat protein supplement powders or drinks.  Spices and herbs. What foods are not recommended?  High-fat foods. These include baked goods, fast food, fatty cuts of meat, ice cream, french toast, sweet rolls, pizza, cheese bread, foods covered with butter, creamy sauces, or cheese.  Fried foods. These include french fries, tempura, battered fish, breaded chicken, fried breads, and sweets.  Foods with strong odors.  Foods that cause bloating and gas. Summary  A low-fat diet can be helpful if you have a gallbladder condition, or before and after gallbladder surgery.  Limit your fat intake to less than 30% of your total daily calories. This is about 60 g of fat if you eat 1,800 calories each day.  Eat small, frequent meals throughout the day instead of three large meals. This information is not intended to replace advice given to you by your health care provider. Make sure you discuss any questions you have with your health care provider. Document Released: 05/01/2013 Document Revised: 08/17/2018 Document Reviewed: 06/03/2016 Elsevier Patient  Education  2020 [employer].

## 2019-03-20 NOTE — Progress Notes (Signed)
03/20/2019  Reason for Visit:  Choledocholithiasis  Referring Provider:  [name], MD  History of Present Illness: [name] is a 73 y.o. female presenting for further management of her choledocholithiasis.  Patient presented to the ED on 9/16 with right flank pain and was diagnosed with UTI and pyelonephritis.  However, on CT scan and U/S in the ED, she was noted to have choledocholithiasis as well.  She had otherwise been asymptomatic from the GI standpoint.  Her laboratory workup in the ED showed normal LFTs.  She had an outpatient MRCP which did confirm presence of choledocholithiasis, and she underwent ERCP with Dr. Allen Norris on 11/5.  She reports that after her procedure, she had epigastric and chest discomfort, particularly feeling some difficulty or pain swallowing.  She mentioned that she felt some of her medications were getting stuck.  She also reports having orange-tinged urine.  Her abdominal/chest symptoms resolved today and her urine is getting clearer in color.  She reports that prior to this visit to the ED, she had not had issues with biliary colic and denies issues with nausea/vomiting.  She does report having issues in the past with kidney stones.  She denies any current symptoms and denies any dysuria.  Denies any jaundice.  Past Medical History: Past Medical History:  Diagnosis Date  . A-fib ([place])    back in 2013, for stent placement in kidney, she had a bout of a-fib.  Now, back in rhythm  . Arthritis   . Chronic kidney disease   . Complication of anesthesia   . Diabetes mellitus without complication ([place])    type 2    only takes metformin  . Dysuria   . History of kidney stones   . Hypertension   . Osteopenia   . PONV (postoperative nausea and vomiting)   . Sleep apnea    lost over 120 lbs, and no long uses it (close to 2 yrs now)  . Stroke [facility])    mini 2003--affected right side of body.   took 6 weeks to get over.  . Urine, incontinence,  stress female      Past Surgical History: Past Surgical History:  Procedure Laterality Date  . COLONOSCOPY W/ POLYPECTOMY Right   . COLONOSCOPY WITH PROPOFOL N/A 01/09/2015   Procedure: COLONOSCOPY WITH PROPOFOL;  Surgeon: Manya Silvas, MD;  Location: [facility] ENDOSCOPY;  Service: Endoscopy;  Laterality: N/A;  . ERCP N/A 03/15/2019   Procedure: ENDOSCOPIC RETROGRADE CHOLANGIOPANCREATOGRAPHY (ERCP);  Surgeon: Lucilla Lame, MD;  Location: [facility] ENDOSCOPY;  Service: Endoscopy;  Laterality: N/A;  . EYE SURGERY     bilateral cataracts with implants  . JOINT REPLACEMENT     left knee  TKR  . KIDNEY STONE SURGERY     40 yrs ago, while pregnant, she had to 'be cut in 1/2 to get stone out'  . KNEE ARTHROSCOPY     left knee  . LITHOTRIPSY    . ltk Left   . LUMBAR LAMINECTOMY/DECOMPRESSION MICRODISCECTOMY N/A 09/06/2016   Procedure: LAMINECTOMY AND FORAMINOTOMY LUMBAR THREE- LUMBAR FOUR, LUMBAR FOUR- LUMBAR FIVE;  Surgeon: Newman Pies, MD;  Location: [place];  Service: Neurosurgery;  Laterality: N/A;  LAMINECTOMY AND FORAMINOTOMY LUMBAR 3- LUMBAR 4, LUMBAR 4- LUMBAR 5   . throidectomy      Home Medications: Prior to Admission medications   Medication Sig Start Date End Date Taking? Authorizing Provider  allopurinol (ZYLOPRIM) 100 MG tablet Take 100 mg by mouth daily.   Yes [provider]  amLODipine (NORVASC) 10 MG tablet Take 10 mg by mouth daily.  07/05/16  Yes [provider]  aspirin 81 MG tablet Take 81 mg by mouth at bedtime.    Yes [provider]  Dulaglutide (TRULICITY) 1.61 WR/6.0AV SOPN Inject 0.75 mg into the skin once a week.   Yes [provider]  gabapentin (NEURONTIN) 100 MG capsule Take 100-200 mg by mouth 2 (two) times daily. Take two capsules (200 mg) every morning and one capsule (100 mg) every evening 08/22/18  Yes [provider]  HYDROcodone-acetaminophen (NORCO/VICODIN) 5-325 MG tablet Take 1 tablet by mouth every 6 (six) hours  as needed. for pain 01/16/19  Yes [provider]  metFORMIN (GLUCOPHAGE) 500 MG tablet Take 1,000 mg by mouth 2 (two) times daily with a meal.  01/10/19  Yes [provider]  metoprolol (LOPRESSOR) 100 MG tablet Take 100 mg by mouth daily.    Yes [provider]  oxyCODONE-acetaminophen (PERCOCET) 5-325 MG tablet Take 1 tablet by mouth every 4 (four) hours as needed for severe pain. 01/24/19 01/24/20 Yes Blake Divine, MD  simvastatin (ZOCOR) 20 MG tablet Take 20 mg by mouth at bedtime.    Yes [provider]  vitamin B-12 (CYANOCOBALAMIN) 1000 MCG tablet Take 1,000 mcg by mouth daily.   Yes [provider]    Allergies: Allergies  Allergen Reactions  . Lactose Intolerance (Gi)   . Morphine And Related Nausea And Vomiting  . Vicodin [Hydrocodone-Acetaminophen] Nausea And Vomiting    Tolerates acetaminophen    Social History:  reports that she has never smoked. She has never used smokeless tobacco. She reports that she does not drink alcohol or use drugs.   Family History: Family History  Problem Relation Age of Onset  . Breast cancer Neg Hx     Review of Systems: Review of Systems  Constitutional: Negative for chills and fever.  HENT: Negative for hearing loss.   Eyes: Negative for blurred vision.  Respiratory: Negative for shortness of breath.   Cardiovascular: Negative for chest pain.  Gastrointestinal: Negative for abdominal pain, nausea and vomiting.  Genitourinary: Negative for dysuria.  Musculoskeletal: Negative for myalgias.  Skin: Negative for rash.  Neurological: Negative for dizziness.  Psychiatric/Behavioral: Negative for depression.    Physical Exam BP 131/82   Pulse 62   Temp (!) 91.6 F (33.1 C) (Temporal)   Ht 5\' 4"  (1.626 m)   Wt 202 lb 9.6 oz (91.9 kg)   SpO2 97%   BMI 34.78 kg/m  CONSTITUTIONAL: No acute distress HEENT:  Normocephalic, atraumatic, extraocular motion intact. NECK: Trachea is midline, and  there is no jugular venous distension.  RESPIRATORY:  Lungs are clear, and breath sounds are equal bilaterally. Normal respiratory effort without pathologic use of accessory muscles. CARDIOVASCULAR: Heart is regular without murmurs, gallops, or rubs. GI: The abdomen is soft, obese, non-distended, non-tender to palpation, with only some mild discomfort in the epigastric and RUQ areas.  Negative Murphy's sign. GU:  No flank or CVA tenderness. MUSCULOSKELETAL:  Normal muscle strength and tone in all four extremities.  No peripheral edema or cyanosis. SKIN: Skin turgor is normal. There are no pathologic skin lesions.  NEUROLOGIC:  Motor and sensation is grossly normal.  Cranial nerves are grossly intact. PSYCH:  Alert and oriented to person, place and time. Affect is normal.  Laboratory Analysis: No results found for this or any previous visit (from the past 24 hour(s)).  Imaging: No results found.  Assessment  and Plan: This is a 73 y.o. female with recent incidental finding of choledocholithiasis, s/p ERCP.  Discussed with the patient that I cannot tell her how long she may have had stones in her CBD as these do not seem to have been causing any symptoms and her labs were normal in the ED.  Her pain in the ED could have been only from her UTI and pyelonephritis or could have some component of choledocholithiasis.  Discussed with her that the way to prevent any future potential episodes, or worsening symptoms or issues would be through cholecystectomy.  At this point, however, the patient is not interested in any surgery and wants to think about it further.  Discussed with her the potential for pancreatitis or cholangitis but she still declined any surgical intervention.  Discussed with her potential symptoms for cholelithiasis or choledocholithiasis to look out for, as well as conservative management with low fat diet.  Patient will call us if any questions, concerns, symptoms, or if she changes her  mind.  Face-to-face time spent with the patient and care providers was 40 minutes, with more than 50% of the time spent counseling, educating, and coordinating care of the patient.     Melvyn Neth, [facility]

## 2019-04-04 ENCOUNTER — Ambulatory Visit (INDEPENDENT_AMBULATORY_CARE_PROVIDER_SITE_OTHER): Payer: Medicare HMO | Admitting: Gastroenterology

## 2019-04-04 ENCOUNTER — Encounter: Payer: Self-pay | Admitting: Gastroenterology

## 2019-04-04 ENCOUNTER — Other Ambulatory Visit: Payer: Self-pay

## 2019-04-04 VITALS — BP 143/77 | HR 53 | Temp 97.8°F | Wt 207.2 lb

## 2019-04-04 DIAGNOSIS — K8050 Calculus of bile duct without cholangitis or cholecystitis without obstruction: Secondary | ICD-10-CM | POA: Diagnosis not present

## 2019-04-04 NOTE — Progress Notes (Signed)
Kelsey Antigua, MD [address]  [place]  [place], [place] [address]  Main: [phone]  Fax: [phone]   Primary Care Physician: Rusty Aus, MD   Chief Complaint  Patient presents with  . Abdominal Pain    Patient is having no symptoms     HPI: [name] is a 73 y.o. female here for follow-up of choledocholithiasis.  Patient was incidentally found to have choledocholithiasis on an outpatient imaging study done by urology.  She is not having any abdominal pain, nausea vomiting or any symptoms of choledocholithiasis itself.  She has since undergone an ERCP with stone removal and sphincterotomy.  She is also seeing Dr. Hampton Abbot of surgery to discuss cholecystectomy but is not interested in surgery.  The patient denies abdominal or flank pain, anorexia, nausea or vomiting, dysphagia, change in bowel habits or black or bloody stools or weight loss.   Current Outpatient Medications  Medication Sig Dispense Refill  . allopurinol (ZYLOPRIM) 100 MG tablet Take 100 mg by mouth daily.    Marland Kitchen amLODipine (NORVASC) 10 MG tablet Take 10 mg by mouth daily.     Marland Kitchen aspirin 81 MG tablet Take 81 mg by mouth at bedtime.     . Dulaglutide (TRULICITY) 3.15 QM/0.8QP SOPN Inject 0.75 mg into the skin once a week.    . gabapentin (NEURONTIN) 100 MG capsule Take 100-200 mg by mouth 2 (two) times daily. Take two capsules (200 mg) every morning and one capsule (100 mg) every evening    . HYDROcodone-acetaminophen (NORCO/VICODIN) 5-325 MG tablet Take 1 tablet by mouth every 6 (six) hours as needed. for pain    . metFORMIN (GLUCOPHAGE) 500 MG tablet Take 1,000 mg by mouth 2 (two) times daily with a meal.     . metoprolol (LOPRESSOR) 100 MG tablet Take 100 mg by mouth daily.     Marland Kitchen oxyCODONE-acetaminophen (PERCOCET) 5-325 MG tablet Take 1 tablet by mouth every 4 (four) hours as needed for severe pain. 12 tablet 0  . simvastatin (ZOCOR) 20 MG tablet Take 20 mg by mouth at bedtime.      . vitamin B-12 (CYANOCOBALAMIN) 1000 MCG tablet Take 1,000 mcg by mouth daily.     No current facility-administered medications for this visit.     Allergies as of 04/04/2019 - Review Complete 04/04/2019  Allergen Reaction Noted  . Lactose intolerance (gi)  01/24/2019  . Morphine and related Nausea And Vomiting 07/09/2016  . Vicodin [hydrocodone-acetaminophen] Nausea And Vomiting 07/09/2016    ROS:  General: Negative for anorexia, weight loss, fever, chills, fatigue, weakness. ENT: Negative for hoarseness, difficulty swallowing , nasal congestion. CV: Negative for chest pain, angina, palpitations, dyspnea on exertion, peripheral edema.  Respiratory: Negative for dyspnea at rest, dyspnea on exertion, cough, sputum, wheezing.  GI: See history of present illness. GU:  Negative for dysuria, hematuria, urinary incontinence, urinary frequency, nocturnal urination.  Endo: Negative for unusual weight change.    Physical Examination:   BP (!) 143/77 (BP Location: Left Arm, Patient Position: Sitting, Cuff Size: Normal)   Pulse (!) 53   Temp 97.8 F (36.6 C) (Oral)   Wt 207 lb 4 oz (94 kg)   BMI 35.57 kg/m   General: Well-nourished, well-developed in no acute distress.  Eyes: No icterus. Conjunctivae pink. Mouth: Oropharyngeal mucosa moist and pink , no lesions erythema or exudate. Neck: Supple, Trachea midline Abdomen: Bowel sounds are normal, nontender, nondistended, no hepatosplenomegaly or masses, no abdominal bruits or hernia ,  no rebound or guarding.   Extremities: No lower extremity edema. No clubbing or deformities. Neuro: Alert and oriented x 3.  Grossly intact. Skin: Warm and dry, no jaundice.   Psych: Alert and cooperative, normal mood and affect.   Labs: CMP     Component Value Date/Time   NA 137 01/24/2019 1137   NA 139 08/13/2013 1255   K 4.0 01/24/2019 1137   K 4.1 08/13/2013 1255   CL 105 01/24/2019 1137   CL 104 08/13/2013 1255   CO2 23 01/24/2019 1137    CO2 30 08/13/2013 1255   GLUCOSE 161 (H) 01/24/2019 1137   GLUCOSE 147 (H) 08/13/2013 1255   BUN 22 01/24/2019 1137   BUN 14 08/13/2013 1255   CREATININE 1.42 (H) 01/24/2019 1137   CREATININE 1.62 (H) 08/13/2013 1255   CALCIUM 9.0 01/24/2019 1137   CALCIUM 9.1 08/13/2013 1255   PROT 6.3 (L) 01/24/2019 1157   PROT 6.2 (L) 07/28/2013 1213   ALBUMIN 3.3 (L) 01/24/2019 1157   ALBUMIN 2.9 (L) 07/28/2013 1213   AST 19 01/24/2019 1157   AST 24 07/28/2013 1213   ALT 21 01/24/2019 1157   ALT 24 07/28/2013 1213   ALKPHOS 65 01/24/2019 1157   ALKPHOS 60 07/28/2013 1213   BILITOT 0.8 01/24/2019 1157   BILITOT 1.3 (H) 07/28/2013 1213   GFRNONAA 36 (L) 01/24/2019 1137   GFRNONAA 32 (L) 08/13/2013 1255   GFRAA 42 (L) 01/24/2019 1137   GFRAA 37 (L) 08/13/2013 1255   Lab Results  Component Value Date   WBC 7.2 01/24/2019   HGB 14.4 01/24/2019   HCT 43.5 01/24/2019   MCV 90.2 01/24/2019   PLT 235 01/24/2019    Imaging Studies: Dg C-arm 1-60 Min-no Report  Result Date: 03/15/2019 Fluoroscopy was utilized by the requesting physician.  No radiographic interpretation.    Assessment and Plan:   [name] is a 72 y.o. y/o female care for follow-up of choledocholithiasis  CBD stone removed by Dr. Verl Blalock Cholecystectomy discussed with patient by Dr. Hampton Abbot and patient refused procedure  I have again discussed the risks of not undergoing gallbladder surgery, and risks of recurrence of choledocholithiasis, and further complications of it in the future.  Patient understands these risks and does not want to undergo surgery.  She would rather have the surgery done if symptoms recur in the future.  Which I discussed with her that in the future the symptoms may be more complicated and she understands these risks  Follow-up with PCP as scheduled Follow-up with [place] as needed    Dr [name]

## 2019-04-20 DIAGNOSIS — H6122 Impacted cerumen, left ear: Secondary | ICD-10-CM | POA: Diagnosis not present

## 2019-07-09 DIAGNOSIS — E1151 Type 2 diabetes mellitus with diabetic peripheral angiopathy without gangrene: Secondary | ICD-10-CM | POA: Diagnosis not present

## 2019-07-09 DIAGNOSIS — M1000 Idiopathic gout, unspecified site: Secondary | ICD-10-CM | POA: Diagnosis not present

## 2019-07-16 DIAGNOSIS — M109 Gout, unspecified: Secondary | ICD-10-CM | POA: Diagnosis not present

## 2019-07-16 DIAGNOSIS — M48062 Spinal stenosis, lumbar region with neurogenic claudication: Secondary | ICD-10-CM | POA: Diagnosis not present

## 2019-07-16 DIAGNOSIS — E1122 Type 2 diabetes mellitus with diabetic chronic kidney disease: Secondary | ICD-10-CM | POA: Diagnosis not present

## 2019-07-16 DIAGNOSIS — E785 Hyperlipidemia, unspecified: Secondary | ICD-10-CM | POA: Diagnosis not present

## 2019-07-16 DIAGNOSIS — Z0000 Encounter for general adult medical examination without abnormal findings: Secondary | ICD-10-CM | POA: Diagnosis not present

## 2019-07-16 DIAGNOSIS — N183 Chronic kidney disease, stage 3 unspecified: Secondary | ICD-10-CM | POA: Diagnosis not present

## 2019-07-16 DIAGNOSIS — Z7984 Long term (current) use of oral hypoglycemic drugs: Secondary | ICD-10-CM | POA: Diagnosis not present

## 2019-07-16 DIAGNOSIS — R600 Localized edema: Secondary | ICD-10-CM | POA: Diagnosis not present

## 2019-07-19 DIAGNOSIS — Z780 Asymptomatic menopausal state: Secondary | ICD-10-CM | POA: Diagnosis not present

## 2019-07-19 DIAGNOSIS — M8588 Other specified disorders of bone density and structure, other site: Secondary | ICD-10-CM | POA: Diagnosis not present

## 2019-08-17 DIAGNOSIS — N1831 Chronic kidney disease, stage 3a: Secondary | ICD-10-CM | POA: Diagnosis not present

## 2019-08-17 DIAGNOSIS — R600 Localized edema: Secondary | ICD-10-CM | POA: Diagnosis not present

## 2019-08-17 DIAGNOSIS — Z79899 Other long term (current) drug therapy: Secondary | ICD-10-CM | POA: Diagnosis not present

## 2019-08-17 DIAGNOSIS — M545 Low back pain: Secondary | ICD-10-CM | POA: Diagnosis not present

## 2019-08-17 DIAGNOSIS — M48062 Spinal stenosis, lumbar region with neurogenic claudication: Secondary | ICD-10-CM | POA: Diagnosis not present

## 2019-08-17 DIAGNOSIS — R300 Dysuria: Secondary | ICD-10-CM | POA: Diagnosis not present

## 2019-08-20 DIAGNOSIS — E119 Type 2 diabetes mellitus without complications: Secondary | ICD-10-CM | POA: Diagnosis not present

## 2019-09-21 ENCOUNTER — Other Ambulatory Visit: Payer: Self-pay | Admitting: Internal Medicine

## 2019-09-21 DIAGNOSIS — Z1231 Encounter for screening mammogram for malignant neoplasm of breast: Secondary | ICD-10-CM

## 2019-10-31 ENCOUNTER — Ambulatory Visit
Admission: RE | Admit: 2019-10-31 | Discharge: 2019-10-31 | Disposition: A | Discharge status: Home / Self Care | Payer: Medicare HMO | Source: Ambulatory Visit | Attending: Internal Medicine | Admitting: Internal Medicine

## 2019-10-31 DIAGNOSIS — Z1231 Encounter for screening mammogram for malignant neoplasm of breast: Secondary | ICD-10-CM

## 2019-11-06 ENCOUNTER — Other Ambulatory Visit: Payer: Self-pay | Admitting: Internal Medicine

## 2019-11-06 DIAGNOSIS — R928 Other abnormal and inconclusive findings on diagnostic imaging of breast: Secondary | ICD-10-CM

## 2019-11-06 DIAGNOSIS — R921 Mammographic calcification found on diagnostic imaging of breast: Secondary | ICD-10-CM

## 2019-11-06 DIAGNOSIS — N6310 Unspecified lump in the right breast, unspecified quadrant: Secondary | ICD-10-CM

## 2019-11-09 ENCOUNTER — Ambulatory Visit
Admission: RE | Admit: 2019-11-09 | Discharge: 2019-11-09 | Disposition: A | Discharge status: Home / Self Care | Payer: Medicare HMO | Source: Ambulatory Visit | Attending: Internal Medicine | Admitting: Internal Medicine

## 2019-11-09 DIAGNOSIS — R928 Other abnormal and inconclusive findings on diagnostic imaging of breast: Secondary | ICD-10-CM

## 2019-11-09 DIAGNOSIS — R921 Mammographic calcification found on diagnostic imaging of breast: Secondary | ICD-10-CM | POA: Diagnosis not present

## 2019-11-09 DIAGNOSIS — N6313 Unspecified lump in the right breast, lower outer quadrant: Secondary | ICD-10-CM | POA: Diagnosis not present

## 2019-11-09 DIAGNOSIS — N6311 Unspecified lump in the right breast, upper outer quadrant: Secondary | ICD-10-CM | POA: Diagnosis not present

## 2019-11-09 DIAGNOSIS — N6310 Unspecified lump in the right breast, unspecified quadrant: Secondary | ICD-10-CM

## 2019-11-14 ENCOUNTER — Other Ambulatory Visit: Payer: Self-pay | Admitting: Internal Medicine

## 2019-11-14 DIAGNOSIS — N6310 Unspecified lump in the right breast, unspecified quadrant: Secondary | ICD-10-CM

## 2019-11-14 DIAGNOSIS — R921 Mammographic calcification found on diagnostic imaging of breast: Secondary | ICD-10-CM

## 2019-11-14 DIAGNOSIS — R928 Other abnormal and inconclusive findings on diagnostic imaging of breast: Secondary | ICD-10-CM

## 2019-11-16 DIAGNOSIS — J42 Unspecified chronic bronchitis: Secondary | ICD-10-CM | POA: Diagnosis not present

## 2019-11-16 DIAGNOSIS — Y92009 Unspecified place in unspecified non-institutional (private) residence as the place of occurrence of the external cause: Secondary | ICD-10-CM | POA: Diagnosis not present

## 2019-11-16 DIAGNOSIS — M79601 Pain in right arm: Secondary | ICD-10-CM | POA: Diagnosis not present

## 2019-11-16 DIAGNOSIS — W010XXA Fall on same level from slipping, tripping and stumbling without subsequent striking against object, initial encounter: Secondary | ICD-10-CM | POA: Diagnosis not present

## 2019-11-16 DIAGNOSIS — M19011 Primary osteoarthritis, right shoulder: Secondary | ICD-10-CM | POA: Diagnosis not present

## 2019-11-20 ENCOUNTER — Ambulatory Visit
Admission: RE | Admit: 2019-11-20 | Discharge: 2019-11-20 | Disposition: A | Discharge status: Home / Self Care | Payer: Medicare HMO | Source: Ambulatory Visit | Attending: Internal Medicine | Admitting: Internal Medicine

## 2019-11-20 DIAGNOSIS — R928 Other abnormal and inconclusive findings on diagnostic imaging of breast: Secondary | ICD-10-CM | POA: Diagnosis not present

## 2019-11-20 DIAGNOSIS — N6310 Unspecified lump in the right breast, unspecified quadrant: Secondary | ICD-10-CM

## 2019-11-20 DIAGNOSIS — R590 Localized enlarged lymph nodes: Secondary | ICD-10-CM | POA: Diagnosis not present

## 2019-11-20 DIAGNOSIS — R921 Mammographic calcification found on diagnostic imaging of breast: Secondary | ICD-10-CM

## 2019-11-20 DIAGNOSIS — R920 Mammographic microcalcification found on diagnostic imaging of breast: Secondary | ICD-10-CM | POA: Diagnosis not present

## 2019-11-21 LAB — SURGICAL PATHOLOGY

## 2019-11-22 HISTORY — PX: BREAST BIOPSY: SHX20

## 2020-01-09 DIAGNOSIS — E1151 Type 2 diabetes mellitus with diabetic peripheral angiopathy without gangrene: Secondary | ICD-10-CM | POA: Diagnosis not present

## 2020-01-09 DIAGNOSIS — E782 Mixed hyperlipidemia: Secondary | ICD-10-CM | POA: Diagnosis not present

## 2020-01-16 DIAGNOSIS — I700 Atherosclerosis of aorta: Secondary | ICD-10-CM | POA: Insufficient documentation

## 2020-01-16 DIAGNOSIS — E785 Hyperlipidemia, unspecified: Secondary | ICD-10-CM | POA: Diagnosis not present

## 2020-01-16 DIAGNOSIS — E1122 Type 2 diabetes mellitus with diabetic chronic kidney disease: Secondary | ICD-10-CM | POA: Diagnosis not present

## 2020-01-16 DIAGNOSIS — N183 Chronic kidney disease, stage 3 unspecified: Secondary | ICD-10-CM | POA: Diagnosis not present

## 2020-01-16 DIAGNOSIS — Z23 Encounter for immunization: Secondary | ICD-10-CM | POA: Diagnosis not present

## 2020-06-26 ENCOUNTER — Other Ambulatory Visit: Payer: Self-pay

## 2020-06-26 ENCOUNTER — Emergency Department: Payer: Medicare HMO

## 2020-06-26 ENCOUNTER — Emergency Department
Admission: EM | Admit: 2020-06-26 | Discharge: 2020-06-26 | Disposition: A | Discharge status: Home / Self Care | Payer: Medicare HMO | Attending: Emergency Medicine | Admitting: Emergency Medicine

## 2020-06-26 DIAGNOSIS — Z79899 Other long term (current) drug therapy: Secondary | ICD-10-CM | POA: Diagnosis not present

## 2020-06-26 DIAGNOSIS — N184 Chronic kidney disease, stage 4 (severe): Secondary | ICD-10-CM | POA: Diagnosis not present

## 2020-06-26 DIAGNOSIS — M47816 Spondylosis without myelopathy or radiculopathy, lumbar region: Secondary | ICD-10-CM | POA: Diagnosis not present

## 2020-06-26 DIAGNOSIS — I728 Aneurysm of other specified arteries: Secondary | ICD-10-CM | POA: Diagnosis not present

## 2020-06-26 DIAGNOSIS — E1122 Type 2 diabetes mellitus with diabetic chronic kidney disease: Secondary | ICD-10-CM | POA: Insufficient documentation

## 2020-06-26 DIAGNOSIS — N390 Urinary tract infection, site not specified: Secondary | ICD-10-CM

## 2020-06-26 DIAGNOSIS — I129 Hypertensive chronic kidney disease with stage 1 through stage 4 chronic kidney disease, or unspecified chronic kidney disease: Secondary | ICD-10-CM | POA: Diagnosis not present

## 2020-06-26 DIAGNOSIS — E1151 Type 2 diabetes mellitus with diabetic peripheral angiopathy without gangrene: Secondary | ICD-10-CM | POA: Insufficient documentation

## 2020-06-26 DIAGNOSIS — N261 Atrophy of kidney (terminal): Secondary | ICD-10-CM | POA: Diagnosis not present

## 2020-06-26 DIAGNOSIS — Z7982 Long term (current) use of aspirin: Secondary | ICD-10-CM | POA: Diagnosis not present

## 2020-06-26 DIAGNOSIS — B9689 Other specified bacterial agents as the cause of diseases classified elsewhere: Secondary | ICD-10-CM | POA: Insufficient documentation

## 2020-06-26 DIAGNOSIS — Z96652 Presence of left artificial knee joint: Secondary | ICD-10-CM | POA: Insufficient documentation

## 2020-06-26 DIAGNOSIS — Z7984 Long term (current) use of oral hypoglycemic drugs: Secondary | ICD-10-CM | POA: Insufficient documentation

## 2020-06-26 DIAGNOSIS — R109 Unspecified abdominal pain: Secondary | ICD-10-CM

## 2020-06-26 DIAGNOSIS — N202 Calculus of kidney with calculus of ureter: Secondary | ICD-10-CM | POA: Diagnosis not present

## 2020-06-26 LAB — CBC
HCT: 46.3 % — ABNORMAL HIGH (ref 36.0–46.0)
Hemoglobin: 15.7 g/dL — ABNORMAL HIGH (ref 12.0–15.0)
MCH: 30.8 pg (ref 26.0–34.0)
MCHC: 33.9 g/dL (ref 30.0–36.0)
MCV: 90.8 fL (ref 80.0–100.0)
Platelets: 239 10*3/uL (ref 150–400)
RBC: 5.1 MIL/uL (ref 3.87–5.11)
RDW: 13.2 % (ref 11.5–15.5)
WBC: 7.7 10*3/uL (ref 4.0–10.5)
nRBC: 0 % (ref 0.0–0.2)

## 2020-06-26 LAB — BASIC METABOLIC PANEL
Anion gap: 11 (ref 5–15)
BUN: 23 mg/dL (ref 8–23)
CO2: 25 mmol/L (ref 22–32)
Calcium: 10.1 mg/dL (ref 8.9–10.3)
Chloride: 102 mmol/L (ref 98–111)
Creatinine, Ser: 1.59 mg/dL — ABNORMAL HIGH (ref 0.44–1.00)
GFR, Estimated: 34 mL/min — ABNORMAL LOW (ref >60)
Glucose, Bld: 213 mg/dL — ABNORMAL HIGH (ref 70–99)
Potassium: 4.4 mmol/L (ref 3.5–5.1)
Sodium: 138 mmol/L (ref 135–145)

## 2020-06-26 LAB — URINALYSIS, COMPLETE (UACMP) WITH MICROSCOPIC
Bilirubin Urine: NEGATIVE
Glucose, UA: NEGATIVE mg/dL
Hgb urine dipstick: NEGATIVE
Ketones, ur: 5 mg/dL — AB
Nitrite: NEGATIVE
Protein, ur: >=300 mg/dL — AB
Specific Gravity, Urine: 1.023 (ref 1.005–1.030)
WBC, UA: >50 WBC/hpf — ABNORMAL HIGH (ref 0–5)
pH: 5 (ref 5.0–8.0)

## 2020-06-26 MED ORDER — CEPHALEXIN 500 MG PO CAPS
500.0000 mg | ORAL_CAPSULE | Freq: Four times a day (QID) | ORAL | 0 refills | Status: AC
Start: 2020-06-26 — End: 2020-07-06

## 2020-06-26 MED ORDER — SODIUM CHLORIDE 0.9 % IV BOLUS
1000.0000 mL | Freq: Once | INTRAVENOUS | Status: AC
  Administered 2020-06-26: 1000 mL via INTRAVENOUS

## 2020-06-26 MED ORDER — SODIUM CHLORIDE 0.9 % IV SOLN
1.0000 g | Freq: Once | INTRAVENOUS | Status: AC
  Administered 2020-06-26: 1 g via INTRAVENOUS
  Filled 2020-06-26: qty 10

## 2020-06-26 NOTE — ED Notes (Signed)
Patient transported to CT 

## 2020-06-26 NOTE — ED Provider Notes (Signed)
[facility] Emergency Department Provider Note   ____________________________________________   I have reviewed the triage vital signs and the nursing notes.   HISTORY  Chief Complaint Flank Pain   History limited by: Not Limited   HPI [name] is a 75 y.o. female who presents to the emergency department today because of concern for right sided flank pain. The patient states that the pain started yesterday.  The pain started suddenly.  Is located in her right flank and has not significantly moved since then.  The patient states that she has also noticed today some difficulty with urination.  Patient does have a history of kidney stones and feels like this somewhat reminds her of the pain.  The patient has not had any fevers.  No significant nausea or vomiting.  Records reviewed. Per medical record review patient has a history of kidney stones.  Past Medical History:  Diagnosis Date  . A-fib ([place])    back in 2013, for stent placement in kidney, she had a bout of a-fib.  Now, back in rhythm  . Arthritis   . Chronic kidney disease   . Complication of anesthesia   . Diabetes mellitus without complication ([place])    type 2    only takes metformin  . Dysuria   . History of kidney stones   . Hypertension   . Osteopenia   . PONV (postoperative nausea and vomiting)   . Sleep apnea    lost over 120 lbs, and no long uses it (close to 2 yrs now)  . Stroke [facility])    mini 2003--affected right side of body.   took 6 weeks to get over.  . Urine, incontinence, stress female     Patient Active Problem List   Diagnosis Date Noted  . Calculus of hepatic duct   . Diabetes mellitus with peripheral angiopathy ([place]) 01/12/2018  . Osteoarthritis 07/09/2016  . Recurrent nephrolithiasis 07/09/2016  . Benign essential hypertension 07/05/2016  . History of CVA (cerebrovascular accident) 07/05/2016  . Medicare annual wellness visit, initial 07/05/2016  . Spinal  stenosis of lumbar region with neurogenic claudication 04/19/2016  . OSA on CPAP 12/29/2015  . CKD (chronic kidney disease) stage 4, GFR 15-29 ml/min (H[facility]) 05/29/2015  . H/O adenomatous polyp of colon 01/02/2015  . Polyarticular gout 05/22/2014  . Diabetes mellitus type 2, controlled, with complications ([place]) 40/81/4481  . Foreign body in bladder and urethra 08/31/2013  . Uric acid nephrolithiasis 08/31/2013  . Kidney stone 08/09/2013  . Ureteric stone 08/09/2013  . Hydronephrosis 08/09/2013  . Renal colic 85/63/1497    Past Surgical History:  Procedure Laterality Date  . COLONOSCOPY W/ POLYPECTOMY Right   . COLONOSCOPY WITH PROPOFOL N/A 01/09/2015   Procedure: COLONOSCOPY WITH PROPOFOL;  Surgeon: Manya Silvas, MD;  Location: [facility] ENDOSCOPY;  Service: Endoscopy;  Laterality: N/A;  . ERCP N/A 03/15/2019   Procedure: ENDOSCOPIC RETROGRADE CHOLANGIOPANCREATOGRAPHY (ERCP);  Surgeon: Lucilla Lame, MD;  Location: [facility] ENDOSCOPY;  Service: Endoscopy;  Laterality: N/A;  . EYE SURGERY     bilateral cataracts with implants  . JOINT REPLACEMENT     left knee  TKR  . KIDNEY STONE SURGERY     40 yrs ago, while pregnant, she had to 'be cut in 1/2 to get stone out'  . KNEE ARTHROSCOPY     left knee  . LITHOTRIPSY    . ltk Left   . LUMBAR LAMINECTOMY/DECOMPRESSION MICRODISCECTOMY N/A 09/06/2016   Procedure: LAMINECTOMY AND FORAMINOTOMY LUMBAR  THREE- LUMBAR FOUR, LUMBAR FOUR- LUMBAR FIVE;  Surgeon: Newman Pies, MD;  Location: [place];  Service: Neurosurgery;  Laterality: N/A;  LAMINECTOMY AND FORAMINOTOMY LUMBAR 3- LUMBAR 4, LUMBAR 4- LUMBAR 5   . throidectomy      Prior to Admission medications   Medication Sig Start Date End Date Taking? Authorizing Provider  allopurinol (ZYLOPRIM) 100 MG tablet Take 100 mg by mouth daily.    [provider]  amLODipine (NORVASC) 10 MG tablet Take 10 mg by mouth daily.  07/05/16   [provider]  aspirin 81 MG tablet Take 81 mg by mouth  at bedtime.     [provider]  Dulaglutide (TRULICITY) 1.61 WR/6.0AV SOPN Inject 0.75 mg into the skin once a week.    [provider]  gabapentin (NEURONTIN) 100 MG capsule Take 100-200 mg by mouth 2 (two) times daily. Take two capsules (200 mg) every morning and one capsule (100 mg) every evening 08/22/18   [provider]  HYDROcodone-acetaminophen (NORCO/VICODIN) 5-325 MG tablet Take 1 tablet by mouth every 6 (six) hours as needed. for pain 01/16/19   [provider]  metFORMIN (GLUCOPHAGE) 500 MG tablet Take 1,000 mg by mouth 2 (two) times daily with a meal.  01/10/19   [provider]  metoprolol (LOPRESSOR) 100 MG tablet Take 100 mg by mouth daily.     [provider]  simvastatin (ZOCOR) 20 MG tablet Take 20 mg by mouth at bedtime.     [provider]  vitamin B-12 (CYANOCOBALAMIN) 1000 MCG tablet Take 1,000 mcg by mouth daily.    [provider]    Allergies Lactose intolerance (gi), Morphine and related, and Vicodin [hydrocodone-acetaminophen]  Family History  Problem Relation Age of Onset  . Breast cancer Neg Hx     Social History Social History   Tobacco Use  . Smoking status: Never Smoker  . Smokeless tobacco: Never Used  Vaping Use  . Vaping Use: Never used  Substance Use Topics  . Alcohol use: No  . Drug use: No    Review of Systems Constitutional: No fever/chills Eyes: No visual changes. ENT: No sore throat. Cardiovascular: Denies chest pain. Respiratory: Denies shortness of breath. Gastrointestinal: Positive for right flank pain. Genitourinary: Positive for difficulty with urination. Musculoskeletal: Negative for back pain. Skin: Negative for rash. Neurological: Negative for headaches, focal weakness or numbness.  ____________________________________________   PHYSICAL EXAM:  VITAL SIGNS: ED Triage Vitals  Enc Vitals Group     BP 06/26/20 1612 (!) 143/75     Pulse Rate 06/26/20  1612 (!) 59     Resp 06/26/20 1612 14     Temp 06/26/20 1612 98.4 F (36.9 C)     Temp Source 06/26/20 1612 Oral     SpO2 06/26/20 1612 96 %     Weight 06/26/20 1613 210 lb (95.3 kg)     Height 06/26/20 1613 5\' 4"  (1.626 m)     Head Circumference --      Peak Flow --      Pain Score 06/26/20 1613 5   Constitutional: Alert and oriented.  Eyes: Conjunctivae are normal.  ENT      Head: Normocephalic and atraumatic.      Nose: No congestion/rhinnorhea.      Mouth/Throat: Mucous membranes are moist.      Neck: No stridor. Hematological/Lymphatic/Immunilogical: No cervical lymphadenopathy. Cardiovascular: Normal rate, regular rhythm.  No murmurs, rubs, or gallops.  Respiratory: Normal respiratory effort without tachypnea nor  retractions. Breath sounds are clear and equal bilaterally. No wheezes/rales/rhonchi. Gastrointestinal: Soft and non tender. No rebound. No guarding.  Genitourinary: Deferred Musculoskeletal: Normal range of motion in all extremities. No lower extremity edema. Neurologic:  Normal speech and language. No gross focal neurologic deficits are appreciated.  Skin:  Skin is warm, dry and intact. No rash noted. Psychiatric: Mood and affect are normal. Speech and behavior are normal. Patient exhibits appropriate insight and judgment.  ____________________________________________    LABS (pertinent positives/negatives)  BMP wnl except glu 213, cr 1.59 CBC wbc 7.7, hgb 15.7, plt 239 UA cloudy, large leukocytes, 6-10 RBC, >50 WBC, many bacteria ____________________________________________   EKG  None  ____________________________________________    RADIOLOGY  CT renal No obstructing kidney stone. Findings consistent with cystitis.  ____________________________________________   PROCEDURES  Procedures  ____________________________________________   INITIAL IMPRESSION / ASSESSMENT AND PLAN / ED COURSE  Pertinent labs & imaging results that were  available during my care of the patient were reviewed by me and considered in my medical decision making (see chart for details).   Patient presented to the emergency department today because of concerns for right flank pain.  Patient's urine did have findings concerning for urinary tract infection.  She also had some red blood cells.  Patient history of kidney stone so a CT scan was performed.  This did not show any obstructing lesion.  I did discuss findings with patient.  I do think it is possible that she might of passed a kidney stone recently however given findings in the urine will give dose of IV antibiotics here.  Will plan on discharging with further antibiotics.   ____________________________________________   FINAL CLINICAL IMPRESSION(S) / ED DIAGNOSES  Final diagnoses:  Right flank pain  Lower urinary tract infectious disease     Note: This dictation was prepared with Dragon dictation. Any transcriptional errors that result from this process are unintentional     Nance Pear, MD 06/26/20 1932

## 2020-06-26 NOTE — ED Triage Notes (Addendum)
Pt to ER via POV from home.  Pt reports onset of right sided flank pain that radiates into lower back that started yesterday. Reports difficulty urinating due to pain but denies frequency, urgency, or dysuria. No fevers at home.   Reports history of 6-7 kidney stones that required lithotripsy's. Reports this pain feels the same.

## 2020-06-26 NOTE — Discharge Instructions (Addendum)
Please seek medical attention for any high fevers, chest pain, shortness of breath, change in behavior, persistent vomiting, bloody stool or any other new or concerning symptoms.  

## 2020-06-29 LAB — URINE CULTURE
Culture: >=100000 — AB
Culture: >=100000 — AB

## 2020-07-11 DIAGNOSIS — N390 Urinary tract infection, site not specified: Secondary | ICD-10-CM | POA: Diagnosis not present

## 2020-07-17 DIAGNOSIS — M109 Gout, unspecified: Secondary | ICD-10-CM | POA: Diagnosis not present

## 2020-07-17 DIAGNOSIS — E1151 Type 2 diabetes mellitus with diabetic peripheral angiopathy without gangrene: Secondary | ICD-10-CM | POA: Diagnosis not present

## 2020-07-24 DIAGNOSIS — I700 Atherosclerosis of aorta: Secondary | ICD-10-CM | POA: Diagnosis not present

## 2020-07-24 DIAGNOSIS — E669 Obesity, unspecified: Secondary | ICD-10-CM | POA: Diagnosis not present

## 2020-07-24 DIAGNOSIS — N183 Chronic kidney disease, stage 3 unspecified: Secondary | ICD-10-CM | POA: Diagnosis not present

## 2020-07-24 DIAGNOSIS — Z0000 Encounter for general adult medical examination without abnormal findings: Secondary | ICD-10-CM | POA: Diagnosis not present

## 2020-07-24 DIAGNOSIS — M159 Polyosteoarthritis, unspecified: Secondary | ICD-10-CM | POA: Diagnosis not present

## 2020-07-24 DIAGNOSIS — E1122 Type 2 diabetes mellitus with diabetic chronic kidney disease: Secondary | ICD-10-CM | POA: Diagnosis not present

## 2020-07-24 DIAGNOSIS — M109 Gout, unspecified: Secondary | ICD-10-CM | POA: Diagnosis not present

## 2020-08-19 DIAGNOSIS — W010XXA Fall on same level from slipping, tripping and stumbling without subsequent striking against object, initial encounter: Secondary | ICD-10-CM | POA: Diagnosis not present

## 2020-08-19 DIAGNOSIS — Y92009 Unspecified place in unspecified non-institutional (private) residence as the place of occurrence of the external cause: Secondary | ICD-10-CM | POA: Diagnosis not present

## 2020-08-19 DIAGNOSIS — M25561 Pain in right knee: Secondary | ICD-10-CM | POA: Diagnosis not present

## 2020-08-20 DIAGNOSIS — E119 Type 2 diabetes mellitus without complications: Secondary | ICD-10-CM | POA: Diagnosis not present

## 2020-09-07 ENCOUNTER — Emergency Department
Admission: EM | Admit: 2020-09-07 | Discharge: 2020-09-07 | Disposition: A | Discharge status: Home / Self Care | Payer: Medicare HMO | Attending: Emergency Medicine | Admitting: Emergency Medicine

## 2020-09-07 ENCOUNTER — Emergency Department: Payer: Medicare HMO

## 2020-09-07 ENCOUNTER — Other Ambulatory Visit: Payer: Self-pay

## 2020-09-07 DIAGNOSIS — E1151 Type 2 diabetes mellitus with diabetic peripheral angiopathy without gangrene: Secondary | ICD-10-CM | POA: Insufficient documentation

## 2020-09-07 DIAGNOSIS — Z96652 Presence of left artificial knee joint: Secondary | ICD-10-CM | POA: Insufficient documentation

## 2020-09-07 DIAGNOSIS — W19XXXA Unspecified fall, initial encounter: Secondary | ICD-10-CM | POA: Diagnosis not present

## 2020-09-07 DIAGNOSIS — M25519 Pain in unspecified shoulder: Secondary | ICD-10-CM | POA: Diagnosis not present

## 2020-09-07 DIAGNOSIS — S43014A Anterior dislocation of right humerus, initial encounter: Secondary | ICD-10-CM | POA: Insufficient documentation

## 2020-09-07 DIAGNOSIS — S0990XA Unspecified injury of head, initial encounter: Secondary | ICD-10-CM | POA: Diagnosis not present

## 2020-09-07 DIAGNOSIS — E1122 Type 2 diabetes mellitus with diabetic chronic kidney disease: Secondary | ICD-10-CM | POA: Insufficient documentation

## 2020-09-07 DIAGNOSIS — Z7984 Long term (current) use of oral hypoglycemic drugs: Secondary | ICD-10-CM | POA: Diagnosis not present

## 2020-09-07 DIAGNOSIS — E1165 Type 2 diabetes mellitus with hyperglycemia: Secondary | ICD-10-CM | POA: Diagnosis not present

## 2020-09-07 DIAGNOSIS — S43034A Inferior dislocation of right humerus, initial encounter: Secondary | ICD-10-CM | POA: Diagnosis not present

## 2020-09-07 DIAGNOSIS — S43004A Unspecified dislocation of right shoulder joint, initial encounter: Secondary | ICD-10-CM

## 2020-09-07 DIAGNOSIS — I129 Hypertensive chronic kidney disease with stage 1 through stage 4 chronic kidney disease, or unspecified chronic kidney disease: Secondary | ICD-10-CM | POA: Diagnosis not present

## 2020-09-07 DIAGNOSIS — D72829 Elevated white blood cell count, unspecified: Secondary | ICD-10-CM | POA: Diagnosis not present

## 2020-09-07 DIAGNOSIS — Z79899 Other long term (current) drug therapy: Secondary | ICD-10-CM | POA: Insufficient documentation

## 2020-09-07 DIAGNOSIS — Y92009 Unspecified place in unspecified non-institutional (private) residence as the place of occurrence of the external cause: Secondary | ICD-10-CM | POA: Diagnosis not present

## 2020-09-07 DIAGNOSIS — N184 Chronic kidney disease, stage 4 (severe): Secondary | ICD-10-CM | POA: Diagnosis not present

## 2020-09-07 DIAGNOSIS — Z8673 Personal history of transient ischemic attack (TIA), and cerebral infarction without residual deficits: Secondary | ICD-10-CM | POA: Diagnosis not present

## 2020-09-07 DIAGNOSIS — S4991XA Unspecified injury of right shoulder and upper arm, initial encounter: Secondary | ICD-10-CM | POA: Diagnosis present

## 2020-09-07 DIAGNOSIS — I10 Essential (primary) hypertension: Secondary | ICD-10-CM | POA: Diagnosis not present

## 2020-09-07 DIAGNOSIS — W010XXA Fall on same level from slipping, tripping and stumbling without subsequent striking against object, initial encounter: Secondary | ICD-10-CM | POA: Insufficient documentation

## 2020-09-07 LAB — CBC
HCT: 48.2 % — ABNORMAL HIGH (ref 36.0–46.0)
Hemoglobin: 16.3 g/dL — ABNORMAL HIGH (ref 12.0–15.0)
MCH: 30.1 pg (ref 26.0–34.0)
MCHC: 33.8 g/dL (ref 30.0–36.0)
MCV: 88.9 fL (ref 80.0–100.0)
Platelets: 283 10*3/uL (ref 150–400)
RBC: 5.42 MIL/uL — ABNORMAL HIGH (ref 3.87–5.11)
RDW: 13 % (ref 11.5–15.5)
WBC: 16.4 10*3/uL — ABNORMAL HIGH (ref 4.0–10.5)
nRBC: 0 % (ref 0.0–0.2)

## 2020-09-07 LAB — BASIC METABOLIC PANEL
Anion gap: 13 (ref 5–15)
BUN: 25 mg/dL — ABNORMAL HIGH (ref 8–23)
CO2: 22 mmol/L (ref 22–32)
Calcium: 9.7 mg/dL (ref 8.9–10.3)
Chloride: 103 mmol/L (ref 98–111)
Creatinine, Ser: 1.42 mg/dL — ABNORMAL HIGH (ref 0.44–1.00)
GFR, Estimated: 38 mL/min — ABNORMAL LOW (ref >60)
Glucose, Bld: 270 mg/dL — ABNORMAL HIGH (ref 70–99)
Potassium: 4.7 mmol/L (ref 3.5–5.1)
Sodium: 138 mmol/L (ref 135–145)

## 2020-09-07 MED ORDER — FENTANYL CITRATE (PF) 100 MCG/2ML IJ SOLN
50.0000 ug | Freq: Once | INTRAMUSCULAR | Status: AC
  Administered 2020-09-07: 50 ug via INTRAVENOUS
  Filled 2020-09-07: qty 2

## 2020-09-07 MED ORDER — ONDANSETRON HCL 4 MG/2ML IJ SOLN
INTRAMUSCULAR | Status: AC
  Administered 2020-09-07: 4 mg
  Filled 2020-09-07: qty 2

## 2020-09-07 MED ORDER — PROPOFOL 10 MG/ML IV BOLUS
0.5000 mg/kg | Freq: Once | INTRAVENOUS | Status: DC
  Filled 2020-09-07: qty 20

## 2020-09-07 MED ORDER — PROPOFOL 10 MG/ML IV BOLUS
INTRAVENOUS | Status: AC | PRN
  Administered 2020-09-07: 47.7 mg via INTRAVENOUS
  Administered 2020-09-07 (×2): 25 mg via INTRAVENOUS

## 2020-09-07 NOTE — ED Triage Notes (Addendum)
Pt from home, tripped over her robot vacuum and landed on her right shoulder. She states she did hit her head on the floor but "mostly fell on her shoulder". Denies LOC, takes 81mg  aspirin qhs. C/o right shoulder pain, denies pain in any other location. No deformities of right shoulder observed, however, it was splinted prior to arrival to ED.

## 2020-09-07 NOTE — ED Provider Notes (Addendum)
[facility] Emergency Department Provider Note   ____________________________________________    I have reviewed the triage vital signs and the nursing notes.   HISTORY  Chief Complaint Shoulder Pain and Fall     HPI [name] is a 75 y.o. female with history as noted below who presents after a mechanical fall.  Patient reports she tripped over her robotic vacuum cleaner, landed on her right arm.  Complains of right shoulder pain only.  Reports she had a glancing blow to the right side of her face which she states is only mildly bruised and not broken.  No significant head injury.  Blood thinners not listed in MAR, received 100 mcg of fentanyl via EMS,  Past Medical History:  Diagnosis Date  . A-fib ([place])    back in 2013, for stent placement in kidney, she had a bout of a-fib.  Now, back in rhythm  . Arthritis   . Chronic kidney disease   . Complication of anesthesia   . Diabetes mellitus without complication ([place])    type 2    only takes metformin  . Dysuria   . History of kidney stones   . Hypertension   . Osteopenia   . PONV (postoperative nausea and vomiting)   . Sleep apnea    lost over 120 lbs, and no long uses it (close to 2 yrs now)  . Stroke [facility])    mini 2003--affected right side of body.   took 6 weeks to get over.  . Urine, incontinence, stress female     Patient Active Problem List   Diagnosis Date Noted  . Calculus of hepatic duct   . Diabetes mellitus with peripheral angiopathy ([place]) 01/12/2018  . Osteoarthritis 07/09/2016  . Recurrent nephrolithiasis 07/09/2016  . Benign essential hypertension 07/05/2016  . History of CVA (cerebrovascular accident) 07/05/2016  . Medicare annual wellness visit, initial 07/05/2016  . Spinal stenosis of lumbar region with neurogenic claudication 04/19/2016  . OSA on CPAP 12/29/2015  . CKD (chronic kidney disease) stage 4, GFR 15-29 ml/min (HCC) 05/29/2015  . H/O adenomatous  polyp of colon 01/02/2015  . Polyarticular gout 05/22/2014  . Diabetes mellitus type 2, controlled, with complications ([place]) 20/02/711  . Foreign body in bladder and urethra 08/31/2013  . Uric acid nephrolithiasis 08/31/2013  . Kidney stone 08/09/2013  . Ureteric stone 08/09/2013  . Hydronephrosis 08/09/2013  . Renal colic 19/75/8832    Past Surgical History:  Procedure Laterality Date  . COLONOSCOPY W/ POLYPECTOMY Right   . COLONOSCOPY WITH PROPOFOL N/A 01/09/2015   Procedure: COLONOSCOPY WITH PROPOFOL;  Surgeon: Manya Silvas, MD;  Location: [facility] ENDOSCOPY;  Service: Endoscopy;  Laterality: N/A;  . ERCP N/A 03/15/2019   Procedure: ENDOSCOPIC RETROGRADE CHOLANGIOPANCREATOGRAPHY (ERCP);  Surgeon: Lucilla Lame, MD;  Location: [facility] ENDOSCOPY;  Service: Endoscopy;  Laterality: N/A;  . EYE SURGERY     bilateral cataracts with implants  . JOINT REPLACEMENT     left knee  TKR  . KIDNEY STONE SURGERY     40 yrs ago, while pregnant, she had to 'be cut in 1/2 to get stone out'  . KNEE ARTHROSCOPY     left knee  . LITHOTRIPSY    . ltk Left   . LUMBAR LAMINECTOMY/DECOMPRESSION MICRODISCECTOMY N/A 09/06/2016   Procedure: LAMINECTOMY AND FORAMINOTOMY LUMBAR THREE- LUMBAR FOUR, LUMBAR FOUR- LUMBAR FIVE;  Surgeon: Newman Pies, MD;  Location: [place];  Service: Neurosurgery;  Laterality: N/A;  LAMINECTOMY AND FORAMINOTOMY LUMBAR  3- LUMBAR 4, LUMBAR 4- LUMBAR 5   . throidectomy      Prior to Admission medications   Medication Sig Start Date End Date Taking? Authorizing Provider  allopurinol (ZYLOPRIM) 100 MG tablet Take 100 mg by mouth daily.    [provider]  amLODipine (NORVASC) 10 MG tablet Take 10 mg by mouth daily.  07/05/16   [provider]  aspirin 81 MG tablet Take 81 mg by mouth at bedtime.     [provider]  Dulaglutide (TRULICITY) 8.52 DP/8.2UM SOPN Inject 0.75 mg into the skin once a week.    [provider]  gabapentin (NEURONTIN) 100 MG  capsule Take 100-200 mg by mouth 2 (two) times daily. Take two capsules (200 mg) every morning and one capsule (100 mg) every evening 08/22/18   [provider]  HYDROcodone-acetaminophen (NORCO/VICODIN) 5-325 MG tablet Take 1 tablet by mouth every 6 (six) hours as needed. for pain 01/16/19   [provider]  metFORMIN (GLUCOPHAGE) 500 MG tablet Take 1,000 mg by mouth 2 (two) times daily with a meal.  01/10/19   [provider]  metoprolol (LOPRESSOR) 100 MG tablet Take 100 mg by mouth daily.     [provider]  simvastatin (ZOCOR) 20 MG tablet Take 20 mg by mouth at bedtime.     [provider]  vitamin B-12 (CYANOCOBALAMIN) 1000 MCG tablet Take 1,000 mcg by mouth daily.    [provider]     Allergies Lactose intolerance (gi), Morphine and related, and Vicodin [hydrocodone-acetaminophen]  Family History  Problem Relation Age of Onset  . Breast cancer Neg Hx     Social History Social History   Tobacco Use  . Smoking status: Never Smoker  . Smokeless tobacco: Never Used  Vaping Use  . Vaping Use: Never used  Substance Use Topics  . Alcohol use: No  . Drug use: No    Review of Systems  Constitutional: No dizziness     Gastrointestinal: No abdominal pain.  No nausea, no vomiting.    Musculoskeletal: Right shoulder pain Skin: Negative for laceration or abrasion Neurological: Negative for headaches     ____________________________________________   PHYSICAL EXAM:  VITAL SIGNS: ED Triage Vitals  Enc Vitals Group     BP 09/07/20 1134 (!) 221/92     Pulse Rate 09/07/20 1134 (!) 52     Resp 09/07/20 1134 20     Temp 09/07/20 1134 98.4 F (36.9 C)     Temp src --      SpO2 09/07/20 1134 95 %     Weight 09/07/20 1136 95.3 kg (210 lb)     Height 09/07/20 1136 1.626 m (5\' 4" )     Head Circumference --      Peak Flow --      Pain Score 09/07/20 1135 7     Pain Loc --      Pain Edu? --      Excl. in [place]? --       Constitutional: Alert and oriented. No acute distress. Pleasant and interactive Eyes: Conjunctivae are normal.  Head: Atraumatic.  No hematoma Nose: No congestion/rhinnorhea. Mouth/Throat: Mucous membranes are moist.   Cardiovascular: Normal rate, regular rhythm.  Respiratory: Normal respiratory effort.  No retractions. Genitourinary: deferred Musculoskeletal: Patient holding right arm abducted, tenderness along the proximal humerus, no bony normalities palpated, warm and well-perfused distally, 2+ pulses.  No vertebral tenderness palpation, no chest wall tenderness palpation, no tenderness over the clavicles  or sternum.  Normal range of motion of all other extremities Neurologic:  Normal speech and language. No gross focal neurologic deficits are appreciated.   Skin:  Skin is warm, dry and intact.     ____________________________________________   LABS (all labs ordered are listed, but only abnormal results are displayed)  Labs Reviewed  CBC - Abnormal; Notable for the following components:      Result Value   WBC 16.4 (*)    RBC 5.42 (*)    Hemoglobin 16.3 (*)    HCT 48.2 (*)    All other components within normal limits  BASIC METABOLIC PANEL - Abnormal; Notable for the following components:   Glucose, Bld 270 (*)    BUN 25 (*)    Creatinine, Ser 1.42 (*)    GFR, Estimated 38 (*)    All other components within normal limits   ____________________________________________  EKG ED ECG REPORT I, Lavonia Drafts, the attending physician, personally viewed and interpreted this ECG.  Date: 09/07/2020  Rhythm: normal sinus rhythm QRS Axis: normal Intervals: IVCD ST/T Wave abnormalities: normal Narrative Interpretation: no evidence of acute ischemia   ____________________________________________  RADIOLOGY  Shoulder x-ray reviewed by me, consistent with anterior dislocation ____________________________________________   PROCEDURES  Procedure(s) performed:  No  .Ortho Injury Treatment  Date/Time: 09/07/2020 1:12 PM Performed by: Lavonia Drafts, MD Authorized by: Lavonia Drafts, MD   Consent:    Consent obtained:  Verbal and written   Consent given by:  Patient   Risks discussed:  Fracture, nerve damage, vascular damage, irreducible dislocation, stiffness and recurrent dislocation   Alternatives discussed:  No treatmentInjury location: shoulder Location details: right shoulder Injury type: dislocation Dislocation type: anterior Hill-Sachs deformity: no Chronicity: new Pre-procedure neurovascular assessment: neurovascularly intact Pre-procedure distal perfusion: normal Pre-procedure neurological function: normal Pre-procedure range of motion: normal  Patient sedated: Yes. Refer to sedation procedure documentation for details of sedation. Manipulation performed: yes Reduction method: external rotation Reduction successful: yes X-ray confirmed reduction: yes Immobilization: splint Post-procedure neurovascular assessment: post-procedure neurovascularly intact Post-procedure distal perfusion: normal Post-procedure neurological function: normal Post-procedure range of motion: normal  .Sedation  Date/Time: 09/07/2020 1:24 PM Performed by: Lavonia Drafts, MD Authorized by: Lavonia Drafts, MD   Consent:    Consent obtained:  Verbal and written   Consent given by:  Patient   Risks discussed:  Prolonged hypoxia resulting in organ damage, dysrhythmia, prolonged sedation necessitating reversal, inadequate sedation, nausea, vomiting and respiratory compromise necessitating ventilatory assistance and intubation   Alternatives discussed:  Analgesia without sedation Universal protocol:    Procedure explained and questions answered to patient or proxy's satisfaction: yes     Immediately prior to procedure, a time out was called: yes   Indications:    Procedure performed:  Dislocation reduction   Procedure necessitating sedation performed by:   Physician performing sedation Pre-sedation assessment:    Time since last food or drink:  5   NPO status caution: urgency dictates proceeding with non-ideal NPO status     ASA classification: class 2 - patient with mild systemic disease     Mouth opening:  3 or more finger widths   Mallampati score:  III - soft palate, base of uvula visible   Pre-sedation assessments completed and reviewed: airway patency, cardiovascular function, hydration status, mental status, pain level and respiratory function     Pre-sedation assessment completed:  09/07/2020 12:45 PM Immediate pre-procedure details:    Reassessment: Patient reassessed immediately prior to procedure     Reviewed:  vital signs and relevant labs/tests     Verified: bag valve mask available, emergency equipment available, intubation equipment available, oxygen available and suction available   Procedure details (see MAR for exact dosages):    Preoxygenation:  Nasal cannula   Sedation:  Propofol   Intended level of sedation: deep   Intra-procedure monitoring:  Blood pressure monitoring, continuous capnometry, cardiac monitor, continuous pulse oximetry and frequent vital sign checks   Intra-procedure events: hypoxia     Intra-procedure management:  BVM ventilation   Total Provider sedation time (minutes):  10 Post-procedure details:    Post-sedation assessment completed:  09/07/2020 1:26 PM   Attendance: Constant attendance by certified staff until patient recovered     Recovery: Patient returned to pre-procedure baseline     Post-sedation assessments completed and reviewed: airway patency, cardiovascular function, mental status, nausea/vomiting and pain level     Patient is stable for discharge or admission: yes     Procedure completion:  Tolerated well, no immediate complications     Critical Care performed: No ____________________________________________   INITIAL IMPRESSION / ASSESSMENT AND PLAN / ED COURSE  Pertinent labs &  imaging results that were available during my care of the patient were reviewed by me and considered in my medical decision making (see chart for details).  Patient presents after a mechanical fall as detailed above, presentation suspicious for proximal humerus fracture, pending x-ray, fentanyl for analgesia  Shoulder x-ray reviewed by me, consistent with anterior dislocation, patient is tolerated propofol in the past, consent obtained  Pending labs, EKG  CBC demonstrates elevated white blood cell count likely related to dislocation/pain, no evidence of infection  Successful reduction, please see procedure note  ----------------------------------------- 2:23 PM on 09/07/2020 -----------------------------------------  Patient is at her baseline, tolerating p.o.'s, feeling much better, has follow-up with orthopedics in 2 days.   ____________________________________________   FINAL CLINICAL IMPRESSION(S) / ED DIAGNOSES  Final diagnoses:  Shoulder dislocation, right, initial encounter      NEW MEDICATIONS STARTED DURING THIS VISIT:  New Prescriptions   No medications on file     Note:  This document was prepared using Dragon voice recognition software and may include unintentional dictation errors.   Lavonia Drafts, MD 09/07/20 1428    Lavonia Drafts, MD 09/07/20 [phone]

## 2020-09-07 NOTE — Sedation Documentation (Signed)
Procedure ended. Sling applied to right arm. X-ray called to obtain post procedure x-ray.

## 2020-09-07 NOTE — Sedation Documentation (Signed)
Procedure began. Cardiac monitoring, pulse ox monitoring, CO2 monitoring in use. See charted vitals.

## 2020-09-07 NOTE — Sedation Documentation (Signed)
Oxygen saturation 90%, patient responding to verbal stimuli. Respirations 15, even and unlabored.

## 2020-09-07 NOTE — Sedation Documentation (Signed)
Patient unresponsive to stimuli, oxygen saturation 60%, respirations 6. Dr. Corky Downs performed jaw thrust and began bagging patient with ambubag.

## 2020-09-09 DIAGNOSIS — M25561 Pain in right knee: Secondary | ICD-10-CM | POA: Diagnosis not present

## 2020-09-09 DIAGNOSIS — S43016A Anterior dislocation of unspecified humerus, initial encounter: Secondary | ICD-10-CM | POA: Diagnosis not present

## 2020-09-09 DIAGNOSIS — M1711 Unilateral primary osteoarthritis, right knee: Secondary | ICD-10-CM | POA: Diagnosis not present

## 2020-09-09 DIAGNOSIS — M25511 Pain in right shoulder: Secondary | ICD-10-CM | POA: Diagnosis not present

## 2020-10-01 DIAGNOSIS — S46011D Strain of muscle(s) and tendon(s) of the rotator cuff of right shoulder, subsequent encounter: Secondary | ICD-10-CM | POA: Diagnosis not present

## 2020-10-01 DIAGNOSIS — S43014A Anterior dislocation of right humerus, initial encounter: Secondary | ICD-10-CM | POA: Diagnosis not present

## 2020-10-01 DIAGNOSIS — M1711 Unilateral primary osteoarthritis, right knee: Secondary | ICD-10-CM | POA: Diagnosis not present

## 2020-10-13 ENCOUNTER — Other Ambulatory Visit: Payer: Self-pay | Admitting: Orthopedic Surgery

## 2020-10-13 DIAGNOSIS — S46011D Strain of muscle(s) and tendon(s) of the rotator cuff of right shoulder, subsequent encounter: Secondary | ICD-10-CM

## 2020-10-13 DIAGNOSIS — S43016A Anterior dislocation of unspecified humerus, initial encounter: Secondary | ICD-10-CM

## 2020-10-19 ENCOUNTER — Other Ambulatory Visit: Payer: Self-pay

## 2020-10-19 ENCOUNTER — Ambulatory Visit
Admission: RE | Admit: 2020-10-19 | Discharge: 2020-10-19 | Disposition: A | Discharge status: Home / Self Care | Payer: Medicare HMO | Source: Ambulatory Visit | Attending: Orthopedic Surgery | Admitting: Orthopedic Surgery

## 2020-10-19 ENCOUNTER — Other Ambulatory Visit: Payer: Medicare HMO

## 2020-10-19 DIAGNOSIS — M25511 Pain in right shoulder: Secondary | ICD-10-CM | POA: Diagnosis not present

## 2020-10-19 DIAGNOSIS — S46011D Strain of muscle(s) and tendon(s) of the rotator cuff of right shoulder, subsequent encounter: Secondary | ICD-10-CM

## 2020-10-19 DIAGNOSIS — S43016A Anterior dislocation of unspecified humerus, initial encounter: Secondary | ICD-10-CM

## 2020-10-21 ENCOUNTER — Other Ambulatory Visit: Payer: Medicare HMO

## 2020-11-04 DIAGNOSIS — E1165 Type 2 diabetes mellitus with hyperglycemia: Secondary | ICD-10-CM | POA: Diagnosis not present

## 2020-11-04 DIAGNOSIS — R0600 Dyspnea, unspecified: Secondary | ICD-10-CM | POA: Diagnosis not present

## 2020-11-04 DIAGNOSIS — M25511 Pain in right shoulder: Secondary | ICD-10-CM | POA: Diagnosis not present

## 2020-11-19 ENCOUNTER — Ambulatory Visit: Payer: Medicare HMO

## 2020-11-19 ENCOUNTER — Other Ambulatory Visit: Payer: Self-pay | Admitting: Orthopedic Surgery

## 2020-11-19 ENCOUNTER — Ambulatory Visit: Admission: RE | Admit: 2020-11-19 | Payer: Medicare HMO | Source: Ambulatory Visit

## 2020-11-19 DIAGNOSIS — S46011D Strain of muscle(s) and tendon(s) of the rotator cuff of right shoulder, subsequent encounter: Secondary | ICD-10-CM

## 2020-11-20 ENCOUNTER — Other Ambulatory Visit: Payer: Self-pay | Admitting: Orthopedic Surgery

## 2020-11-20 ENCOUNTER — Other Ambulatory Visit: Payer: Self-pay

## 2020-11-20 ENCOUNTER — Ambulatory Visit
Admission: RE | Admit: 2020-11-20 | Discharge: 2020-11-20 | Disposition: A | Discharge status: Home / Self Care | Payer: Medicare HMO | Source: Ambulatory Visit | Attending: Orthopedic Surgery | Admitting: Orthopedic Surgery

## 2020-11-20 ENCOUNTER — Ambulatory Visit: Admission: RE | Admit: 2020-11-20 | Payer: Medicare HMO | Source: Ambulatory Visit

## 2020-11-20 DIAGNOSIS — S46011D Strain of muscle(s) and tendon(s) of the rotator cuff of right shoulder, subsequent encounter: Secondary | ICD-10-CM | POA: Diagnosis not present

## 2020-11-20 DIAGNOSIS — S43004A Unspecified dislocation of right shoulder joint, initial encounter: Secondary | ICD-10-CM | POA: Diagnosis not present

## 2020-11-20 DIAGNOSIS — M19011 Primary osteoarthritis, right shoulder: Secondary | ICD-10-CM | POA: Diagnosis not present

## 2020-11-28 ENCOUNTER — Other Ambulatory Visit: Payer: Self-pay | Admitting: Internal Medicine

## 2020-11-28 ENCOUNTER — Other Ambulatory Visit: Payer: Self-pay

## 2020-11-28 ENCOUNTER — Other Ambulatory Visit
Admission: RE | Admit: 2020-11-28 | Discharge: 2020-11-28 | Disposition: A | Discharge status: Home / Self Care | Payer: Medicare HMO | Source: Ambulatory Visit | Attending: Orthopedic Surgery | Admitting: Orthopedic Surgery

## 2020-11-28 DIAGNOSIS — Z01818 Encounter for other preprocedural examination: Secondary | ICD-10-CM | POA: Diagnosis not present

## 2020-11-28 DIAGNOSIS — Z1231 Encounter for screening mammogram for malignant neoplasm of breast: Secondary | ICD-10-CM

## 2020-11-28 HISTORY — DX: Dyspnea, unspecified: R06.00

## 2020-11-28 LAB — CBC WITH DIFFERENTIAL/PLATELET
Abs Immature Granulocytes: 0.03 10*3/uL (ref 0.00–0.07)
Basophils Absolute: 0.1 10*3/uL (ref 0.0–0.1)
Basophils Relative: 1 %
Eosinophils Absolute: 0.3 10*3/uL (ref 0.0–0.5)
Eosinophils Relative: 4 %
HCT: 48.2 % — ABNORMAL HIGH (ref 36.0–46.0)
Hemoglobin: 16.3 g/dL — ABNORMAL HIGH (ref 12.0–15.0)
Immature Granulocytes: 0 %
Lymphocytes Relative: 23 %
Lymphs Abs: 2 10*3/uL (ref 0.7–4.0)
MCH: 31.8 pg (ref 26.0–34.0)
MCHC: 33.8 g/dL (ref 30.0–36.0)
MCV: 94 fL (ref 80.0–100.0)
Monocytes Absolute: 0.7 10*3/uL (ref 0.1–1.0)
Monocytes Relative: 8 %
Neutro Abs: 5.6 10*3/uL (ref 1.7–7.7)
Neutrophils Relative %: 64 %
Platelets: 265 10*3/uL (ref 150–400)
RBC: 5.13 MIL/uL — ABNORMAL HIGH (ref 3.87–5.11)
RDW: 14.1 % (ref 11.5–15.5)
WBC: 8.7 10*3/uL (ref 4.0–10.5)
nRBC: 0 % (ref 0.0–0.2)

## 2020-11-28 LAB — TYPE AND SCREEN
ABO/RH(D): A POS
Antibody Screen: NEGATIVE

## 2020-11-28 LAB — SURGICAL PCR SCREEN
MRSA, PCR: NEGATIVE
Staphylococcus aureus: NEGATIVE

## 2020-11-28 NOTE — Patient Instructions (Addendum)
Your procedure is scheduled on: Monday December 08, 2020. Report to Day Surgery inside [place] 2nd floor stop by admissions desk first before getting on elevator. To find out your arrival time please call [phone] between 1PM - 3PM on Friday December 04, 2020.  Remember: Instructions that are not followed completely may result in serious medical risk,  up to and including death, or upon the discretion of your surgeon and anesthesiologist your  surgery may need to be rescheduled.     _X__ 1. Do not eat food after midnight the night before your procedure.                 No chewing gum or hard candies. You may drink clear liquids up to 2 hours                 before you are scheduled to arrive for your surgery- DO not drink clear                 liquids within 2 hours of the start of your surgery.                 Clear Liquids include:  water, apple juice without pulp, clear Gatorade, G2 or                  Gatorade Zero (avoid Red/Purple/Blue), Black Coffee or Tea (Do not add                 anything to coffee or tea).  __X__2.   Complete the "Ensure Clear Pre-surgery Clear Carbohydrate Drink" provided to you, 2 hours before arrival. **If you are diabetic you will be provided with an alternative drink, Gatorade Zero or G2.  __X__3.  On the morning of surgery brush your teeth with toothpaste and water, you                may rinse your mouth with mouthwash if you wish.  Do not swallow any toothpaste of mouthwash.     _X__ 4.  No Alcohol for 24 hours before or after surgery.   _X__ 5.  Do Not Smoke or use e-cigarettes For 24 Hours Prior to Your Surgery.                 Do not use any chewable tobacco products for at least 6 hours prior to                 Surgery.  _X__  6.  Do not use any recreational drugs (marijuana, cocaine, heroin, ecstasy, MDMA or other)                For at least one week prior to your surgery.  Combination of these drugs with anesthesia                 May have life threatening results.  __X__  7.  Notify your doctor if there is any change in your medical condition      (cold, fever, infections).     Do not wear jewelry, make-up, hairpins, clips or nail polish. Do not wear lotions, powders, or perfumes. You may wear deodorant. Do not shave 48 hours prior to surgery. Men may shave face and neck. Do not bring valuables to the hospital.    [facility] is not responsible for any belongings or valuables.  Contacts, dentures or bridgework may not be worn into surgery. Leave your suitcase in the car. After  surgery it may be brought to your room. For patients admitted to the hospital, discharge time is determined by your treatment team.   Patients discharged the day of surgery will not be allowed to drive home.   Make arrangements for someone to be with you for the first 24 hours of your Same Day Discharge.   __X__ Take these medicines the morning of surgery with A SIP OF WATER:    1. allopurinol (ZYLOPRIM) 100 MG   2. amLODipine (NORVASC) 10 MG  3. gabapentin (NEURONTIN) 200 MG  4. metoprolol (LOPRESSOR) 100 MG  5.  6.  ____ Fleet Enema (as directed)   __X__ Use CHG Soap (or wipes) as directed  __X__ Use Benzoyl Peroxide Gel as instructed  ____ Use inhalers on the day of surgery  __X__ Stop metformin 2 days prior to surgery last dose Friday December 05, 2020.   ____ Take 1/2 of usual insulin dose the night before surgery. No insulin the morning          of surgery.   __X__ Already stopped aspirin 81 mg  before your surgery.   __X__ One Week prior to surgery- Stop Anti-inflammatories such as Ibuprofen, Aleve, Advil, Motrin, meloxicam (MOBIC), diclofenac, etodolac, ketorolac, Toradol, Daypro, piroxicam, Goody's or BC powders. OK TO USE TYLENOL IF NEEDED   __X__ Stop supplements until after surgery.    ____ Bring C-Pap to the hospital.    If you have any questions regarding your pre-procedure instructions,   Please call Pre-admit Testing at [phone]

## 2020-12-04 ENCOUNTER — Other Ambulatory Visit: Payer: Self-pay

## 2020-12-04 ENCOUNTER — Encounter: Payer: Self-pay | Admitting: Urgent Care

## 2020-12-04 ENCOUNTER — Other Ambulatory Visit
Admission: RE | Admit: 2020-12-04 | Discharge: 2020-12-04 | Disposition: A | Discharge status: Home / Self Care | Payer: Medicare HMO | Source: Ambulatory Visit | Attending: Orthopedic Surgery | Admitting: Orthopedic Surgery

## 2020-12-04 DIAGNOSIS — M7521 Bicipital tendinitis, right shoulder: Secondary | ICD-10-CM | POA: Diagnosis not present

## 2020-12-04 DIAGNOSIS — R5381 Other malaise: Secondary | ICD-10-CM | POA: Diagnosis not present

## 2020-12-04 DIAGNOSIS — R8290 Unspecified abnormal findings in urine: Secondary | ICD-10-CM | POA: Insufficient documentation

## 2020-12-04 DIAGNOSIS — E739 Lactose intolerance, unspecified: Secondary | ICD-10-CM | POA: Diagnosis present

## 2020-12-04 DIAGNOSIS — Z4731 Aftercare following explantation of shoulder joint prosthesis: Secondary | ICD-10-CM | POA: Diagnosis not present

## 2020-12-04 DIAGNOSIS — Z8249 Family history of ischemic heart disease and other diseases of the circulatory system: Secondary | ICD-10-CM | POA: Diagnosis not present

## 2020-12-04 DIAGNOSIS — Z9181 History of falling: Secondary | ICD-10-CM | POA: Diagnosis not present

## 2020-12-04 DIAGNOSIS — Z20822 Contact with and (suspected) exposure to covid-19: Secondary | ICD-10-CM | POA: Insufficient documentation

## 2020-12-04 DIAGNOSIS — Z7984 Long term (current) use of oral hypoglycemic drugs: Secondary | ICD-10-CM | POA: Diagnosis not present

## 2020-12-04 DIAGNOSIS — G4733 Obstructive sleep apnea (adult) (pediatric): Secondary | ICD-10-CM | POA: Diagnosis not present

## 2020-12-04 DIAGNOSIS — L89321 Pressure ulcer of left buttock, stage 1: Secondary | ICD-10-CM | POA: Diagnosis present

## 2020-12-04 DIAGNOSIS — F419 Anxiety disorder, unspecified: Secondary | ICD-10-CM | POA: Diagnosis present

## 2020-12-04 DIAGNOSIS — R2681 Unsteadiness on feet: Secondary | ICD-10-CM | POA: Diagnosis not present

## 2020-12-04 DIAGNOSIS — Z823 Family history of stroke: Secondary | ICD-10-CM | POA: Diagnosis not present

## 2020-12-04 DIAGNOSIS — E119 Type 2 diabetes mellitus without complications: Secondary | ICD-10-CM | POA: Diagnosis present

## 2020-12-04 DIAGNOSIS — Z885 Allergy status to narcotic agent status: Secondary | ICD-10-CM | POA: Diagnosis not present

## 2020-12-04 DIAGNOSIS — G8918 Other acute postprocedural pain: Secondary | ICD-10-CM | POA: Diagnosis not present

## 2020-12-04 DIAGNOSIS — Z87442 Personal history of urinary calculi: Secondary | ICD-10-CM | POA: Diagnosis not present

## 2020-12-04 DIAGNOSIS — Z888 Allergy status to other drugs, medicaments and biological substances status: Secondary | ICD-10-CM | POA: Diagnosis not present

## 2020-12-04 DIAGNOSIS — Z7982 Long term (current) use of aspirin: Secondary | ICD-10-CM | POA: Diagnosis not present

## 2020-12-04 DIAGNOSIS — N189 Chronic kidney disease, unspecified: Secondary | ICD-10-CM | POA: Diagnosis present

## 2020-12-04 DIAGNOSIS — Z471 Aftercare following joint replacement surgery: Secondary | ICD-10-CM | POA: Diagnosis not present

## 2020-12-04 DIAGNOSIS — Z96652 Presence of left artificial knee joint: Secondary | ICD-10-CM | POA: Diagnosis present

## 2020-12-04 DIAGNOSIS — K219 Gastro-esophageal reflux disease without esophagitis: Secondary | ICD-10-CM | POA: Diagnosis present

## 2020-12-04 DIAGNOSIS — I69351 Hemiplegia and hemiparesis following cerebral infarction affecting right dominant side: Secondary | ICD-10-CM | POA: Diagnosis not present

## 2020-12-04 DIAGNOSIS — M25511 Pain in right shoulder: Secondary | ICD-10-CM | POA: Diagnosis not present

## 2020-12-04 DIAGNOSIS — I129 Hypertensive chronic kidney disease with stage 1 through stage 4 chronic kidney disease, or unspecified chronic kidney disease: Secondary | ICD-10-CM | POA: Diagnosis present

## 2020-12-04 DIAGNOSIS — M19011 Primary osteoarthritis, right shoulder: Secondary | ICD-10-CM | POA: Diagnosis present

## 2020-12-04 DIAGNOSIS — Z4789 Encounter for other orthopedic aftercare: Secondary | ICD-10-CM | POA: Diagnosis not present

## 2020-12-04 DIAGNOSIS — S46011A Strain of muscle(s) and tendon(s) of the rotator cuff of right shoulder, initial encounter: Secondary | ICD-10-CM | POA: Diagnosis not present

## 2020-12-04 DIAGNOSIS — S46011D Strain of muscle(s) and tendon(s) of the rotator cuff of right shoulder, subsequent encounter: Secondary | ICD-10-CM | POA: Diagnosis not present

## 2020-12-04 DIAGNOSIS — Z96611 Presence of right artificial shoulder joint: Secondary | ICD-10-CM | POA: Diagnosis not present

## 2020-12-04 DIAGNOSIS — Z01812 Encounter for preprocedural laboratory examination: Secondary | ICD-10-CM | POA: Diagnosis present

## 2020-12-04 DIAGNOSIS — Z79899 Other long term (current) drug therapy: Secondary | ICD-10-CM | POA: Diagnosis not present

## 2020-12-04 DIAGNOSIS — J439 Emphysema, unspecified: Secondary | ICD-10-CM | POA: Diagnosis not present

## 2020-12-04 DIAGNOSIS — E1122 Type 2 diabetes mellitus with diabetic chronic kidney disease: Secondary | ICD-10-CM | POA: Diagnosis present

## 2020-12-04 DIAGNOSIS — M6281 Muscle weakness (generalized): Secondary | ICD-10-CM | POA: Diagnosis not present

## 2020-12-04 DIAGNOSIS — M67813 Other specified disorders of tendon, right shoulder: Secondary | ICD-10-CM | POA: Diagnosis not present

## 2020-12-04 DIAGNOSIS — L89311 Pressure ulcer of right buttock, stage 1: Secondary | ICD-10-CM | POA: Diagnosis present

## 2020-12-04 DIAGNOSIS — M75101 Unspecified rotator cuff tear or rupture of right shoulder, not specified as traumatic: Secondary | ICD-10-CM | POA: Diagnosis present

## 2020-12-04 DIAGNOSIS — R279 Unspecified lack of coordination: Secondary | ICD-10-CM | POA: Diagnosis not present

## 2020-12-04 DIAGNOSIS — M25711 Osteophyte, right shoulder: Secondary | ICD-10-CM | POA: Diagnosis present

## 2020-12-04 LAB — URINALYSIS, ROUTINE W REFLEX MICROSCOPIC
Bilirubin Urine: NEGATIVE
Glucose, UA: NEGATIVE mg/dL
Ketones, ur: NEGATIVE mg/dL
Nitrite: NEGATIVE
Protein, ur: 100 mg/dL — AB
Specific Gravity, Urine: 1.014 (ref 1.005–1.030)
WBC, UA: >50 WBC/hpf — ABNORMAL HIGH (ref 0–5)
pH: 6 (ref 5.0–8.0)

## 2020-12-04 LAB — SARS CORONAVIRUS 2 (TAT 6-24 HRS): SARS Coronavirus 2: NEGATIVE

## 2020-12-04 NOTE — Progress Notes (Signed)
  [facility] Perioperative Services: Pre-Admission/Anesthesia Testing  Abnormal Lab Notification   Date: 12/04/20  Name: [name] MRN:   [id number]  Re: Abnormal labs noted during PAT appointment   Provider(s) Notified: Leim Fabry, MD Notification mode: Routed and/or faxed via [place] LAB VALUE(S): Lab Results  Component Value Date   COLORURINE YELLOW (A) 12/04/2020   APPEARANCEUR CLOUDY (A) 12/04/2020   LABSPEC 1.014 12/04/2020   PHURINE 6.0 12/04/2020   GLUCOSEU NEGATIVE 12/04/2020   HGBUR SMALL (A) 12/04/2020   BILIRUBINUR NEGATIVE 12/04/2020   KETONESUR NEGATIVE 12/04/2020   PROTEINUR 100 (A) 12/04/2020   NITRITE NEGATIVE 12/04/2020   LEUKOCYTESUR LARGE (A) 12/04/2020   EPIU 6-10 12/04/2020   WBCU >50 (H) 12/04/2020   RBCU 0-5 12/04/2020   BACTERIA RARE (A) 12/04/2020   Notes:  Patient is scheduled for a RIGHT reverse total shoulder arthroplasty and possible biceps tenodesis on 12/08/2020. Patient was unable to provide urine sample on 11/28/2020 when initially here (PAT) for lab testing. She returned sample today (12/04/2020) when she came in for SARS-CoV-2 testing.   UA performed in PAT concerning for infection.  No leukocytosis noted on CBC; WBC 8700 on 11/28/2020 Urine C&S added to assess for pathogenically significant growth.  Will forward UA, and subsequent C&S results, to attending surgeon for review and Tx as deemed appropriate.   This is a Community education officer; no formal response is required.  Honor Loh, MSN, APRN, FNP-C, CEN [facility]  Peri-operative Services Nurse Practitioner Phone: [phone] Fax: [phone] 12/04/20 12:04 PM

## 2020-12-07 LAB — URINE CULTURE: Culture: >=100000 — AB

## 2020-12-08 ENCOUNTER — Inpatient Hospital Stay
Admission: AD | Admit: 2020-12-08 | Discharge: 2020-12-11 | DRG: 483 | Disposition: A | Discharge status: Skilled Nursing Facility | Payer: Medicare HMO | Attending: Orthopedic Surgery | Admitting: Orthopedic Surgery

## 2020-12-08 ENCOUNTER — Inpatient Hospital Stay: Payer: Medicare HMO

## 2020-12-08 ENCOUNTER — Encounter: Payer: Self-pay | Admitting: Orthopedic Surgery

## 2020-12-08 ENCOUNTER — Ambulatory Visit: Payer: Medicare HMO | Admitting: Urgent Care

## 2020-12-08 ENCOUNTER — Encounter
Admission: AD | Disposition: A | Discharge status: Skilled Nursing Facility | Payer: Self-pay | Source: Home / Self Care | Attending: Orthopedic Surgery

## 2020-12-08 ENCOUNTER — Other Ambulatory Visit: Payer: Self-pay

## 2020-12-08 ENCOUNTER — Ambulatory Visit: Payer: Medicare HMO

## 2020-12-08 DIAGNOSIS — J439 Emphysema, unspecified: Secondary | ICD-10-CM | POA: Diagnosis not present

## 2020-12-08 DIAGNOSIS — M25711 Osteophyte, right shoulder: Secondary | ICD-10-CM | POA: Diagnosis present

## 2020-12-08 DIAGNOSIS — E119 Type 2 diabetes mellitus without complications: Secondary | ICD-10-CM | POA: Diagnosis present

## 2020-12-08 DIAGNOSIS — E739 Lactose intolerance, unspecified: Secondary | ICD-10-CM | POA: Diagnosis present

## 2020-12-08 DIAGNOSIS — L89311 Pressure ulcer of right buttock, stage 1: Secondary | ICD-10-CM | POA: Diagnosis present

## 2020-12-08 DIAGNOSIS — Z7984 Long term (current) use of oral hypoglycemic drugs: Secondary | ICD-10-CM

## 2020-12-08 DIAGNOSIS — Z7982 Long term (current) use of aspirin: Secondary | ICD-10-CM

## 2020-12-08 DIAGNOSIS — M75101 Unspecified rotator cuff tear or rupture of right shoulder, not specified as traumatic: Secondary | ICD-10-CM | POA: Diagnosis present

## 2020-12-08 DIAGNOSIS — Z885 Allergy status to narcotic agent status: Secondary | ICD-10-CM | POA: Diagnosis not present

## 2020-12-08 DIAGNOSIS — Z96652 Presence of left artificial knee joint: Secondary | ICD-10-CM | POA: Diagnosis present

## 2020-12-08 DIAGNOSIS — Z20822 Contact with and (suspected) exposure to covid-19: Secondary | ICD-10-CM | POA: Diagnosis present

## 2020-12-08 DIAGNOSIS — I69351 Hemiplegia and hemiparesis following cerebral infarction affecting right dominant side: Secondary | ICD-10-CM | POA: Diagnosis not present

## 2020-12-08 DIAGNOSIS — Z823 Family history of stroke: Secondary | ICD-10-CM

## 2020-12-08 DIAGNOSIS — L89321 Pressure ulcer of left buttock, stage 1: Secondary | ICD-10-CM | POA: Diagnosis present

## 2020-12-08 DIAGNOSIS — Z79899 Other long term (current) drug therapy: Secondary | ICD-10-CM | POA: Diagnosis not present

## 2020-12-08 DIAGNOSIS — M7521 Bicipital tendinitis, right shoulder: Secondary | ICD-10-CM | POA: Diagnosis not present

## 2020-12-08 DIAGNOSIS — Z96619 Presence of unspecified artificial shoulder joint: Secondary | ICD-10-CM

## 2020-12-08 DIAGNOSIS — M25511 Pain in right shoulder: Secondary | ICD-10-CM | POA: Diagnosis not present

## 2020-12-08 DIAGNOSIS — I129 Hypertensive chronic kidney disease with stage 1 through stage 4 chronic kidney disease, or unspecified chronic kidney disease: Secondary | ICD-10-CM | POA: Diagnosis present

## 2020-12-08 DIAGNOSIS — Z8249 Family history of ischemic heart disease and other diseases of the circulatory system: Secondary | ICD-10-CM

## 2020-12-08 DIAGNOSIS — N189 Chronic kidney disease, unspecified: Secondary | ICD-10-CM | POA: Diagnosis present

## 2020-12-08 DIAGNOSIS — R5381 Other malaise: Secondary | ICD-10-CM | POA: Diagnosis not present

## 2020-12-08 DIAGNOSIS — F419 Anxiety disorder, unspecified: Secondary | ICD-10-CM | POA: Diagnosis present

## 2020-12-08 DIAGNOSIS — K219 Gastro-esophageal reflux disease without esophagitis: Secondary | ICD-10-CM | POA: Diagnosis present

## 2020-12-08 DIAGNOSIS — Z471 Aftercare following joint replacement surgery: Secondary | ICD-10-CM | POA: Diagnosis not present

## 2020-12-08 DIAGNOSIS — E1122 Type 2 diabetes mellitus with diabetic chronic kidney disease: Secondary | ICD-10-CM | POA: Diagnosis present

## 2020-12-08 DIAGNOSIS — S46011A Strain of muscle(s) and tendon(s) of the rotator cuff of right shoulder, initial encounter: Secondary | ICD-10-CM | POA: Diagnosis not present

## 2020-12-08 DIAGNOSIS — M19011 Primary osteoarthritis, right shoulder: Principal | ICD-10-CM | POA: Diagnosis present

## 2020-12-08 DIAGNOSIS — S46011D Strain of muscle(s) and tendon(s) of the rotator cuff of right shoulder, subsequent encounter: Secondary | ICD-10-CM | POA: Diagnosis not present

## 2020-12-08 DIAGNOSIS — Z888 Allergy status to other drugs, medicaments and biological substances status: Secondary | ICD-10-CM | POA: Diagnosis not present

## 2020-12-08 DIAGNOSIS — L8990 Pressure ulcer of unspecified site, unspecified stage: Secondary | ICD-10-CM | POA: Insufficient documentation

## 2020-12-08 DIAGNOSIS — Z87442 Personal history of urinary calculi: Secondary | ICD-10-CM

## 2020-12-08 DIAGNOSIS — Z96611 Presence of right artificial shoulder joint: Secondary | ICD-10-CM | POA: Diagnosis not present

## 2020-12-08 DIAGNOSIS — M12819 Other specific arthropathies, not elsewhere classified, unspecified shoulder: Secondary | ICD-10-CM | POA: Diagnosis present

## 2020-12-08 DIAGNOSIS — G4733 Obstructive sleep apnea (adult) (pediatric): Secondary | ICD-10-CM | POA: Diagnosis not present

## 2020-12-08 DIAGNOSIS — R279 Unspecified lack of coordination: Secondary | ICD-10-CM | POA: Diagnosis not present

## 2020-12-08 DIAGNOSIS — Z419 Encounter for procedure for purposes other than remedying health state, unspecified: Secondary | ICD-10-CM

## 2020-12-08 DIAGNOSIS — G8918 Other acute postprocedural pain: Secondary | ICD-10-CM | POA: Diagnosis not present

## 2020-12-08 DIAGNOSIS — M6281 Muscle weakness (generalized): Secondary | ICD-10-CM | POA: Diagnosis not present

## 2020-12-08 HISTORY — PX: REVERSE SHOULDER ARTHROPLASTY: SHX5054

## 2020-12-08 LAB — BASIC METABOLIC PANEL
Anion gap: 7 (ref 5–15)
BUN: 26 mg/dL — ABNORMAL HIGH (ref 8–23)
CO2: 21 mmol/L — ABNORMAL LOW (ref 22–32)
Calcium: 8.7 mg/dL — ABNORMAL LOW (ref 8.9–10.3)
Chloride: 109 mmol/L (ref 98–111)
Creatinine, Ser: 1.68 mg/dL — ABNORMAL HIGH (ref 0.44–1.00)
GFR, Estimated: 31 mL/min — ABNORMAL LOW (ref >60)
Glucose, Bld: 196 mg/dL — ABNORMAL HIGH (ref 70–99)
Potassium: 4.5 mmol/L (ref 3.5–5.1)
Sodium: 137 mmol/L (ref 135–145)

## 2020-12-08 LAB — GLUCOSE, CAPILLARY
Glucose-Capillary: 130 mg/dL — ABNORMAL HIGH (ref 70–99)
Glucose-Capillary: 113 mg/dL — ABNORMAL HIGH (ref 70–99)

## 2020-12-08 LAB — ABO/RH: ABO/RH(D): A POS

## 2020-12-08 SURGERY — ARTHROPLASTY, SHOULDER, TOTAL, REVERSE
Anesthesia: General | Site: Shoulder | Laterality: Right

## 2020-12-08 MED ORDER — SULFAMETHOXAZOLE-TRIMETHOPRIM 800-160 MG PO TABS: 1.0000 | ORAL_TABLET | Freq: Two times a day (BID) | ORAL | Status: DC

## 2020-12-08 MED ORDER — DOCUSATE SODIUM 100 MG PO CAPS
100.0000 mg | ORAL_CAPSULE | Freq: Two times a day (BID) | ORAL | Status: DC
  Administered 2020-12-08 – 2020-12-11 (×6): 100 mg via ORAL
  Filled 2020-12-08 (×6): qty 1

## 2020-12-08 MED ORDER — FAMOTIDINE 20 MG PO TABS
ORAL_TABLET | ORAL | Status: AC
  Administered 2020-12-08: 20 mg via ORAL
  Filled 2020-12-08: qty 1

## 2020-12-08 MED ORDER — LIDOCAINE HCL (PF) 1 % IJ SOLN
INTRAMUSCULAR | Status: AC
  Filled 2020-12-08: qty 5

## 2020-12-08 MED ORDER — GLIPIZIDE 5 MG PO TABS
5.0000 mg | ORAL_TABLET | Freq: Every day | ORAL | Status: DC
  Administered 2020-12-09 – 2020-12-11 (×3): 5 mg via ORAL
  Filled 2020-12-08 (×3): qty 1

## 2020-12-08 MED ORDER — SUGAMMADEX SODIUM 200 MG/2ML IV SOLN
INTRAVENOUS | Status: DC | PRN
  Administered 2020-12-08: 200 mg via INTRAVENOUS

## 2020-12-08 MED ORDER — FENTANYL CITRATE (PF) 100 MCG/2ML IJ SOLN
25.0000 ug | INTRAMUSCULAR | Status: DC | PRN
  Administered 2020-12-08 (×3): 25 ug via INTRAVENOUS

## 2020-12-08 MED ORDER — ASPIRIN EC 325 MG PO TBEC
325.0000 mg | DELAYED_RELEASE_TABLET | Freq: Every day | ORAL | Status: DC
  Administered 2020-12-09 – 2020-12-11 (×3): 325 mg via ORAL
  Filled 2020-12-08 (×3): qty 1

## 2020-12-08 MED ORDER — MENTHOL 3 MG MT LOZG
1.0000 | LOZENGE | OROMUCOSAL | Status: DC | PRN
  Filled 2020-12-08: qty 9

## 2020-12-08 MED ORDER — TRANEXAMIC ACID-NACL 1000-0.7 MG/100ML-% IV SOLN
INTRAVENOUS | Status: AC
  Administered 2020-12-08: 1000 mg via INTRAVENOUS
  Filled 2020-12-08: qty 100

## 2020-12-08 MED ORDER — HYDROMORPHONE HCL 1 MG/ML IJ SOLN: 0.2000 mg | INTRAMUSCULAR | Status: DC | PRN

## 2020-12-08 MED ORDER — VANCOMYCIN HCL 1000 MG IV SOLR
INTRAVENOUS | Status: DC | PRN
  Administered 2020-12-08: 1000 mg via TOPICAL

## 2020-12-08 MED ORDER — BISACODYL 10 MG RE SUPP: 10.0000 mg | Freq: Every day | RECTAL | Status: DC | PRN

## 2020-12-08 MED ORDER — CHLORHEXIDINE GLUCONATE 0.12 % MT SOLN
OROMUCOSAL | Status: AC
  Administered 2020-12-08: 15 mL via OROMUCOSAL
  Filled 2020-12-08: qty 15

## 2020-12-08 MED ORDER — LIDOCAINE HCL (CARDIAC) PF 100 MG/5ML IV SOSY
PREFILLED_SYRINGE | INTRAVENOUS | Status: DC | PRN
  Administered 2020-12-08: 100 mg via INTRAVENOUS

## 2020-12-08 MED ORDER — BUPIVACAINE HCL (PF) 0.5 % IJ SOLN
INTRAMUSCULAR | Status: AC
  Filled 2020-12-08: qty 10

## 2020-12-08 MED ORDER — SEVOFLURANE IN SOLN
RESPIRATORY_TRACT | Status: AC
  Filled 2020-12-08: qty 250

## 2020-12-08 MED ORDER — SODIUM CHLORIDE 0.9 % IV SOLN: INTRAVENOUS | Status: DC

## 2020-12-08 MED ORDER — PHENYLEPHRINE HCL (PRESSORS) 10 MG/ML IV SOLN
INTRAVENOUS | Status: DC | PRN
  Administered 2020-12-08: 30 ug via INTRAVENOUS

## 2020-12-08 MED ORDER — FENTANYL CITRATE (PF) 100 MCG/2ML IJ SOLN
INTRAMUSCULAR | Status: AC
  Administered 2020-12-08: 25 ug via INTRAVENOUS
  Filled 2020-12-08: qty 2

## 2020-12-08 MED ORDER — VANCOMYCIN HCL 1000 MG IV SOLR
INTRAVENOUS | Status: AC
  Filled 2020-12-08: qty 1000

## 2020-12-08 MED ORDER — APREPITANT 40 MG PO CAPS: 40.0000 mg | ORAL_CAPSULE | Freq: Once | ORAL | Status: AC

## 2020-12-08 MED ORDER — AMLODIPINE BESYLATE 10 MG PO TABS
10.0000 mg | ORAL_TABLET | Freq: Every day | ORAL | Status: DC
  Administered 2020-12-09 – 2020-12-11 (×3): 10 mg via ORAL
  Filled 2020-12-08 (×3): qty 1

## 2020-12-08 MED ORDER — ALUM & MAG HYDROXIDE-SIMETH 200-200-20 MG/5ML PO SUSP: 30.0000 mL | ORAL | Status: DC | PRN

## 2020-12-08 MED ORDER — ONDANSETRON HCL 4 MG/2ML IJ SOLN
4.0000 mg | Freq: Four times a day (QID) | INTRAMUSCULAR | Status: DC | PRN
  Filled 2020-12-08: qty 2

## 2020-12-08 MED ORDER — METOPROLOL TARTRATE 50 MG PO TABS
100.0000 mg | ORAL_TABLET | Freq: Every day | ORAL | Status: DC
  Administered 2020-12-09 – 2020-12-11 (×3): 100 mg via ORAL
  Filled 2020-12-08 (×3): qty 2

## 2020-12-08 MED ORDER — ALLOPURINOL 100 MG PO TABS
100.0000 mg | ORAL_TABLET | Freq: Every day | ORAL | Status: DC
  Administered 2020-12-09 – 2020-12-11 (×3): 100 mg via ORAL
  Filled 2020-12-08 (×3): qty 1

## 2020-12-08 MED ORDER — TRAMADOL HCL 50 MG PO TABS
50.0000 mg | ORAL_TABLET | Freq: Four times a day (QID) | ORAL | Status: DC | PRN
Start: 2020-12-08 — End: 2020-12-11
  Administered 2020-12-10: 50 mg via ORAL
  Filled 2020-12-08: qty 1

## 2020-12-08 MED ORDER — ONDANSETRON HCL 4 MG/2ML IJ SOLN
INTRAMUSCULAR | Status: DC | PRN
  Administered 2020-12-08: 4 mg via INTRAVENOUS

## 2020-12-08 MED ORDER — DEXAMETHASONE SODIUM PHOSPHATE 10 MG/ML IJ SOLN
INTRAMUSCULAR | Status: DC | PRN
  Administered 2020-12-08: 10 mg via INTRAVENOUS

## 2020-12-08 MED ORDER — ONDANSETRON HCL 4 MG PO TABS: 4.0000 mg | ORAL_TABLET | Freq: Four times a day (QID) | ORAL | Status: DC | PRN

## 2020-12-08 MED ORDER — ORAL CARE MOUTH RINSE: 15.0000 mL | Freq: Once | OROMUCOSAL | Status: AC

## 2020-12-08 MED ORDER — NEOMYCIN-POLYMYXIN B GU 40-200000 IR SOLN
Status: AC
  Filled 2020-12-08: qty 20

## 2020-12-08 MED ORDER — METOCLOPRAMIDE HCL 5 MG/ML IJ SOLN
5.0000 mg | Freq: Three times a day (TID) | INTRAMUSCULAR | Status: DC | PRN
  Administered 2020-12-09: 10 mg via INTRAVENOUS
  Filled 2020-12-08: qty 2

## 2020-12-08 MED ORDER — DEXAMETHASONE SODIUM PHOSPHATE 10 MG/ML IJ SOLN
INTRAMUSCULAR | Status: AC
  Filled 2020-12-08: qty 1

## 2020-12-08 MED ORDER — LIDOCAINE HCL (PF) 1 % IJ SOLN
INTRAMUSCULAR | Status: DC | PRN
  Administered 2020-12-08: 5 mL via SUBCUTANEOUS

## 2020-12-08 MED ORDER — EPHEDRINE 5 MG/ML INJ
INTRAVENOUS | Status: AC
  Filled 2020-12-08: qty 5

## 2020-12-08 MED ORDER — CEFAZOLIN SODIUM-DEXTROSE 2-4 GM/100ML-% IV SOLN
INTRAVENOUS | Status: AC
  Administered 2020-12-09: 2 g via INTRAVENOUS
  Filled 2020-12-08: qty 100

## 2020-12-08 MED ORDER — FENTANYL CITRATE (PF) 100 MCG/2ML IJ SOLN
INTRAMUSCULAR | Status: AC
  Filled 2020-12-08: qty 2

## 2020-12-08 MED ORDER — GLYCOPYRROLATE 0.2 MG/ML IJ SOLN
INTRAMUSCULAR | Status: DC | PRN
  Administered 2020-12-08: .2 mg via INTRAVENOUS

## 2020-12-08 MED ORDER — SUCCINYLCHOLINE CHLORIDE 200 MG/10ML IV SOSY
PREFILLED_SYRINGE | INTRAVENOUS | Status: DC | PRN
  Administered 2020-12-08: 120 mg via INTRAVENOUS

## 2020-12-08 MED ORDER — TRANEXAMIC ACID-NACL 1000-0.7 MG/100ML-% IV SOLN
1000.0000 mg | INTRAVENOUS | Status: AC
  Administered 2020-12-08: 1000 mg via INTRAVENOUS

## 2020-12-08 MED ORDER — ACETAMINOPHEN 500 MG PO TABS
1000.0000 mg | ORAL_TABLET | Freq: Three times a day (TID) | ORAL | Status: DC
  Administered 2020-12-08 – 2020-12-11 (×10): 1000 mg via ORAL
  Filled 2020-12-08 (×10): qty 2

## 2020-12-08 MED ORDER — FENTANYL CITRATE (PF) 100 MCG/2ML IJ SOLN
INTRAMUSCULAR | Status: DC | PRN
  Administered 2020-12-08: 50 ug via INTRAVENOUS

## 2020-12-08 MED ORDER — BUPIVACAINE LIPOSOME 1.3 % IJ SUSP
INTRAMUSCULAR | Status: AC
  Filled 2020-12-08: qty 20

## 2020-12-08 MED ORDER — PROPOFOL 500 MG/50ML IV EMUL
INTRAVENOUS | Status: DC | PRN
  Administered 2020-12-08: 10 ug/kg/min via INTRAVENOUS

## 2020-12-08 MED ORDER — PROPOFOL 10 MG/ML IV BOLUS
INTRAVENOUS | Status: AC
  Filled 2020-12-08: qty 20

## 2020-12-08 MED ORDER — REMIFENTANIL HCL 1 MG IV SOLR
INTRAVENOUS | Status: AC
  Filled 2020-12-08: qty 1000

## 2020-12-08 MED ORDER — BUPIVACAINE HCL (PF) 0.5 % IJ SOLN
INTRAMUSCULAR | Status: DC | PRN
  Administered 2020-12-08: 10 mL via PERINEURAL

## 2020-12-08 MED ORDER — TRANEXAMIC ACID-NACL 1000-0.7 MG/100ML-% IV SOLN: 1000.0000 mg | Freq: Once | INTRAVENOUS | Status: AC

## 2020-12-08 MED ORDER — SODIUM CHLORIDE 0.9 % IR SOLN
Status: DC | PRN
  Administered 2020-12-08: 1000 mL

## 2020-12-08 MED ORDER — CEFAZOLIN SODIUM-DEXTROSE 2-4 GM/100ML-% IV SOLN
2.0000 g | INTRAVENOUS | Status: AC
  Administered 2020-12-08: 2 g via INTRAVENOUS

## 2020-12-08 MED ORDER — ALPRAZOLAM 0.5 MG PO TABS
0.5000 mg | ORAL_TABLET | Freq: Two times a day (BID) | ORAL | Status: DC | PRN
  Administered 2020-12-09 – 2020-12-11 (×4): 0.5 mg via ORAL
  Filled 2020-12-08 (×5): qty 1

## 2020-12-08 MED ORDER — GABAPENTIN 100 MG PO CAPS: 100.0000 mg | ORAL_CAPSULE | ORAL | Status: DC

## 2020-12-08 MED ORDER — NEOMYCIN-POLYMYXIN B GU 40-200000 IR SOLN
Status: DC | PRN
  Administered 2020-12-08: 12 mL

## 2020-12-08 MED ORDER — SENNOSIDES-DOCUSATE SODIUM 8.6-50 MG PO TABS: 1.0000 | ORAL_TABLET | Freq: Every evening | ORAL | Status: DC | PRN

## 2020-12-08 MED ORDER — PROPOFOL 10 MG/ML IV BOLUS
INTRAVENOUS | Status: DC | PRN
  Administered 2020-12-08: 120 mg via INTRAVENOUS

## 2020-12-08 MED ORDER — SODIUM CHLORIDE 0.9 % IV SOLN
INTRAVENOUS | Status: DC | PRN
  Administered 2020-12-08: 30 ug/min via INTRAVENOUS

## 2020-12-08 MED ORDER — SUCCINYLCHOLINE CHLORIDE 200 MG/10ML IV SOSY
PREFILLED_SYRINGE | INTRAVENOUS | Status: AC
  Filled 2020-12-08: qty 10

## 2020-12-08 MED ORDER — CEFAZOLIN SODIUM-DEXTROSE 2-4 GM/100ML-% IV SOLN
2.0000 g | Freq: Four times a day (QID) | INTRAVENOUS | Status: AC
  Administered 2020-12-08 (×2): 2 g via INTRAVENOUS
  Filled 2020-12-08 (×4): qty 100

## 2020-12-08 MED ORDER — ROCURONIUM BROMIDE 10 MG/ML (PF) SYRINGE
PREFILLED_SYRINGE | INTRAVENOUS | Status: AC
  Filled 2020-12-08: qty 20

## 2020-12-08 MED ORDER — 0.9 % SODIUM CHLORIDE (POUR BTL) OPTIME
TOPICAL | Status: DC | PRN
  Administered 2020-12-08: 1000 mL

## 2020-12-08 MED ORDER — ONDANSETRON HCL 4 MG/2ML IJ SOLN
INTRAMUSCULAR | Status: AC
  Filled 2020-12-08: qty 2

## 2020-12-08 MED ORDER — FENTANYL CITRATE (PF) 100 MCG/2ML IJ SOLN: 50.0000 ug | INTRAMUSCULAR | Status: DC | PRN

## 2020-12-08 MED ORDER — FAMOTIDINE 20 MG PO TABS: 20.0000 mg | ORAL_TABLET | Freq: Once | ORAL | Status: AC

## 2020-12-08 MED ORDER — TRANEXAMIC ACID-NACL 1000-0.7 MG/100ML-% IV SOLN
INTRAVENOUS | Status: AC
  Filled 2020-12-08: qty 100

## 2020-12-08 MED ORDER — PHENOL 1.4 % MT LIQD
1.0000 | OROMUCOSAL | Status: DC | PRN
  Filled 2020-12-08: qty 177

## 2020-12-08 MED ORDER — MIDAZOLAM HCL 2 MG/2ML IJ SOLN
INTRAMUSCULAR | Status: AC
  Administered 2020-12-08: 1 mg via INTRAVENOUS
  Filled 2020-12-08: qty 2

## 2020-12-08 MED ORDER — EPHEDRINE SULFATE 50 MG/ML IJ SOLN
INTRAMUSCULAR | Status: DC | PRN
Start: 2020-12-08 — End: 2020-12-08
  Administered 2020-12-08: 5 mg via INTRAVENOUS
  Administered 2020-12-08: 10 mg via INTRAVENOUS

## 2020-12-08 MED ORDER — CHLORHEXIDINE GLUCONATE 0.12 % MT SOLN: 15.0000 mL | Freq: Once | OROMUCOSAL | Status: AC

## 2020-12-08 MED ORDER — GABAPENTIN 100 MG PO CAPS
100.0000 mg | ORAL_CAPSULE | Freq: Every day | ORAL | Status: DC
  Administered 2020-12-08 – 2020-12-10 (×3): 100 mg via ORAL
  Filled 2020-12-08 (×3): qty 1

## 2020-12-08 MED ORDER — ACETAMINOPHEN 10 MG/ML IV SOLN
INTRAVENOUS | Status: AC
  Filled 2020-12-08: qty 100

## 2020-12-08 MED ORDER — OXYCODONE HCL 5 MG PO TABS
10.0000 mg | ORAL_TABLET | ORAL | Status: DC | PRN
  Administered 2020-12-09: 10 mg via ORAL

## 2020-12-08 MED ORDER — METOCLOPRAMIDE HCL 10 MG PO TABS: 5.0000 mg | ORAL_TABLET | Freq: Three times a day (TID) | ORAL | Status: DC | PRN

## 2020-12-08 MED ORDER — REMIFENTANIL HCL 1 MG IV SOLR
INTRAVENOUS | Status: DC | PRN
  Administered 2020-12-08: .05 ug/kg/min via INTRAVENOUS

## 2020-12-08 MED ORDER — ROCURONIUM BROMIDE 100 MG/10ML IV SOLN
INTRAVENOUS | Status: DC | PRN
  Administered 2020-12-08: 20 mg via INTRAVENOUS

## 2020-12-08 MED ORDER — APREPITANT 40 MG PO CAPS
ORAL_CAPSULE | ORAL | Status: AC
  Administered 2020-12-08: 40 mg via ORAL
  Filled 2020-12-08: qty 1

## 2020-12-08 MED ORDER — ACETAMINOPHEN 10 MG/ML IV SOLN
INTRAVENOUS | Status: DC | PRN
  Administered 2020-12-08: 1000 mg via INTRAVENOUS

## 2020-12-08 MED ORDER — GABAPENTIN 100 MG PO CAPS
200.0000 mg | ORAL_CAPSULE | Freq: Every morning | ORAL | Status: DC
  Administered 2020-12-09 – 2020-12-11 (×3): 200 mg via ORAL
  Filled 2020-12-08 (×2): qty 2

## 2020-12-08 MED ORDER — PHENYLEPHRINE HCL (PRESSORS) 10 MG/ML IV SOLN
INTRAVENOUS | Status: AC
  Filled 2020-12-08: qty 1

## 2020-12-08 MED ORDER — GLYCOPYRROLATE 0.2 MG/ML IJ SOLN
INTRAMUSCULAR | Status: AC
  Filled 2020-12-08: qty 1

## 2020-12-08 MED ORDER — FENTANYL CITRATE (PF) 100 MCG/2ML IJ SOLN
INTRAMUSCULAR | Status: AC
  Administered 2020-12-08: 50 ug via INTRAVENOUS
  Filled 2020-12-08: qty 2

## 2020-12-08 MED ORDER — OXYCODONE HCL 5 MG PO TABS
5.0000 mg | ORAL_TABLET | ORAL | Status: DC | PRN
  Administered 2020-12-08: 10 mg via ORAL
  Filled 2020-12-08 (×2): qty 2

## 2020-12-08 MED ORDER — ROSUVASTATIN CALCIUM 10 MG PO TABS
10.0000 mg | ORAL_TABLET | Freq: Every day | ORAL | Status: DC
  Administered 2020-12-08 – 2020-12-11 (×4): 10 mg via ORAL
  Filled 2020-12-08 (×4): qty 1

## 2020-12-08 MED ORDER — MIDAZOLAM HCL 2 MG/2ML IJ SOLN
1.0000 mg | INTRAMUSCULAR | Status: DC | PRN
Start: 2020-12-08 — End: 2020-12-08

## 2020-12-08 MED ORDER — BUPIVACAINE LIPOSOME 1.3 % IJ SUSP
INTRAMUSCULAR | Status: DC | PRN
  Administered 2020-12-08: 20 mL via PERINEURAL

## 2020-12-08 MED ORDER — KETOROLAC TROMETHAMINE 15 MG/ML IJ SOLN
7.5000 mg | Freq: Four times a day (QID) | INTRAMUSCULAR | Status: AC
  Administered 2020-12-08 – 2020-12-09 (×4): 7.5 mg via INTRAVENOUS
  Filled 2020-12-08 (×4): qty 1

## 2020-12-08 MED ORDER — METFORMIN HCL 500 MG PO TABS
500.0000 mg | ORAL_TABLET | Freq: Two times a day (BID) | ORAL | Status: DC
  Administered 2020-12-08 – 2020-12-11 (×6): 500 mg via ORAL
  Filled 2020-12-08 (×6): qty 1

## 2020-12-08 MED ORDER — ONDANSETRON HCL 4 MG/2ML IJ SOLN: 4.0000 mg | Freq: Once | INTRAMUSCULAR | Status: DC | PRN

## 2020-12-08 SURGICAL SUPPLY — 80 items
BASEPLATE GLENOSPHERE 25 STD (Miscellaneous) ×2 IMPLANT
BIT DRILL 3.2 PERIPHERAL SCREW (BIT) ×2 IMPLANT
BLADE SAGITTAL WIDE XTHICK NO (BLADE) ×2 IMPLANT
CANISTER SUCT 1200ML W/VALVE (MISCELLANEOUS) IMPLANT
CHLORAPREP W/TINT 26 (MISCELLANEOUS) ×2 IMPLANT
CNTNR SPEC 2.5X3XGRAD LEK (MISCELLANEOUS) ×1
CONT SPEC 4OZ STER OR WHT (MISCELLANEOUS) ×1
CONT SPEC 4OZ STRL OR WHT (MISCELLANEOUS) ×1
CONTAINER SPEC 2.5X3XGRAD LEK (MISCELLANEOUS) ×1 IMPLANT
COOLER POLAR GLACIER W/PUMP (MISCELLANEOUS) ×2 IMPLANT
COVER BACK TABLE REUSABLE LG (DRAPES) ×2 IMPLANT
DRAPE 3/4 80X56 (DRAPES) ×4 IMPLANT
DRAPE IMP U-DRAPE 54X76 (DRAPES) ×4 IMPLANT
DRAPE INCISE IOBAN 66X45 STRL (DRAPES) ×4 IMPLANT
DRAPE U-SHAPE 47X51 STRL (DRAPES) ×2 IMPLANT
DRSG OPSITE POSTOP 3X4 (GAUZE/BANDAGES/DRESSINGS) IMPLANT
DRSG OPSITE POSTOP 4X6 (GAUZE/BANDAGES/DRESSINGS) ×2 IMPLANT
DRSG OPSITE POSTOP 4X8 (GAUZE/BANDAGES/DRESSINGS) ×2 IMPLANT
DRSG TEGADERM 2-3/8X2-3/4 SM (GAUZE/BANDAGES/DRESSINGS) ×2 IMPLANT
DRSG TEGADERM 4X10 (GAUZE/BANDAGES/DRESSINGS) IMPLANT
ELECT REM PT RETURN 9FT ADLT (ELECTROSURGICAL) ×2
ELECTRODE REM PT RTRN 9FT ADLT (ELECTROSURGICAL) ×1 IMPLANT
GAUZE 4X4 16PLY ~~LOC~~+RFID DBL (SPONGE) IMPLANT
GAUZE XEROFORM 1X8 LF (GAUZE/BANDAGES/DRESSINGS) ×2 IMPLANT
GLENOSPHERE +2 INF OFFSET 36 (Shoulder) ×2 IMPLANT
GLOVE SRG 8 PF TXTR STRL LF DI (GLOVE) ×2 IMPLANT
GLOVE SURG ORTHO LTX SZ8 (GLOVE) ×4 IMPLANT
GLOVE SURG SYN 8.0 (GLOVE) ×2 IMPLANT
GLOVE SURG UNDER POLY LF SZ8 (GLOVE) ×4
GOWN STRL REUS W/ TWL LRG LVL3 (GOWN DISPOSABLE) ×2 IMPLANT
GOWN STRL REUS W/ TWL XL LVL3 (GOWN DISPOSABLE) ×1 IMPLANT
GOWN STRL REUS W/TWL LRG LVL3 (GOWN DISPOSABLE) ×4
GOWN STRL REUS W/TWL XL LVL3 (GOWN DISPOSABLE) ×2
GUIDE GLENOID PATIENT REVERSED (MISCELLANEOUS) ×2 IMPLANT
GUIDEWIRE GLENOID 2.5X220 (WIRE) ×4 IMPLANT
HEMOVAC 400CC 10FR (MISCELLANEOUS) IMPLANT
HOLDER FOLEY CATH W/STRAP (MISCELLANEOUS) ×2 IMPLANT
HOOD PEEL AWAY FLYTE STAYCOOL (MISCELLANEOUS) ×6 IMPLANT
IMPL REVERSE SHOULDER 0X3.5 (Shoulder) ×1 IMPLANT
IMPLANT REVERSE SHOULDER 0X3.5 (Shoulder) ×2 IMPLANT
INSERT HUMERAL 36X6MM 12.5DEG (Insert) ×2 IMPLANT
IV NS IRRIG 3000ML ARTHROMATIC (IV SOLUTION) ×2 IMPLANT
KIT STABILIZATION SHOULDER (MISCELLANEOUS) ×2 IMPLANT
MANIFOLD NEPTUNE II (INSTRUMENTS) ×2 IMPLANT
MASK FACE SPIDER DISP (MASK) ×2 IMPLANT
MAT ABSORB  FLUID 56X50 GRAY (MISCELLANEOUS) ×4
MAT ABSORB FLUID 56X50 GRAY (MISCELLANEOUS) ×2 IMPLANT
NEEDLE REVERSE CUT 1/2 CRC (NEEDLE) ×2 IMPLANT
NEEDLE SPNL 20GX3.5 QUINCKE YW (NEEDLE) ×2 IMPLANT
NS IRRIG 1000ML POUR BTL (IV SOLUTION) ×2 IMPLANT
PACK ARTHROSCOPY SHOULDER (MISCELLANEOUS) ×2 IMPLANT
PAD WRAPON POLAR SHDR XLG (MISCELLANEOUS) ×1 IMPLANT
PASSER SUT SWANSON 36MM LOOP (INSTRUMENTS) IMPLANT
PENCIL SMOKE EVACUATOR (MISCELLANEOUS) ×2 IMPLANT
PULSAVAC PLUS IRRIG FAN TIP (DISPOSABLE) ×2
RETRIEVER SUT HEWSON (MISCELLANEOUS) IMPLANT
SCREW 5.0X18 (Screw) ×6 IMPLANT
SCREW 5.5X22 (Screw) ×2 IMPLANT
SCREW BONE THREAD 6.5X35 (Screw) ×2 IMPLANT
SLING ULTRA II LG (MISCELLANEOUS) ×2 IMPLANT
SLING ULTRA II M (MISCELLANEOUS) IMPLANT
SPONGE T-LAP 18X18 ~~LOC~~+RFID (SPONGE) ×4 IMPLANT
SPONGE T-LAP 18X36 ~~LOC~~+RFID STR (SPONGE) IMPLANT
STAPLER SKIN PROX 35W (STAPLE) ×2 IMPLANT
STEM HUMERAL SZ2B STND 70 PTC (Stem) ×2 IMPLANT
STEM HUMERAL SZ2BSTD 70 PTC (Stem) ×1 IMPLANT
STRAP SAFETY 5IN WIDE (MISCELLANEOUS) ×4 IMPLANT
STRIP CLOSURE SKIN 1/2X4 (GAUZE/BANDAGES/DRESSINGS) ×2 IMPLANT
SUT FIBERWIRE #2 38 BLUE 1/2 (SUTURE) ×4
SUT MNCRL AB 4-0 PS2 18 (SUTURE) IMPLANT
SUT PROLENE 6 0 P 1 18 (SUTURE) IMPLANT
SUT TICRON 2-0 30IN 311381 (SUTURE) ×2 IMPLANT
SUT VIC AB 0 CT1 36 (SUTURE) ×2 IMPLANT
SUT VIC AB 2-0 CT2 27 (SUTURE) ×4 IMPLANT
SUTURE FIBERWR #2 38 BLUE 1/2 (SUTURE) ×2 IMPLANT
SYR 20ML LL LF (SYRINGE) ×2 IMPLANT
SYR 30ML LL (SYRINGE) ×4 IMPLANT
TIP FAN IRRIG PULSAVAC PLUS (DISPOSABLE) ×1 IMPLANT
TRAY FOLEY SLVR 16FR LF STAT (SET/KITS/TRAYS/PACK) ×2 IMPLANT
WRAPON POLAR PAD SHDR XLG (MISCELLANEOUS) ×2

## 2020-12-08 NOTE — TOC Progression Note (Signed)
Transition of Care [facility]) - Progression Note    Patient Details  Name: [name] MRN: [id number] Date of Birth: 1945-08-19  Transition of [facility]) CM/SW [place], RN Phone Number: 12/08/2020, 4:29 PM  Clinical Narrative:    Spoke with the patient to discuss DC plan and needs She lives at home with her husband, she is willing to go to [facility] SNF.  PASSR obtained, FL2 completed, Bedsearch sent        Expected Discharge Plan and Services                                                 Social Determinants of Health (SDOH) Interventions    Readmission Risk Interventions No flowsheet data found.

## 2020-12-08 NOTE — Anesthesia Procedure Notes (Signed)
Procedure Name: Intubation Date/Time: 12/08/2020 7:52 AM Performed by: Lily Peer, [place], CRNA Pre-anesthesia Checklist: Patient identified, Emergency Drugs available, Suction available and Patient being monitored Patient Re-evaluated:Patient Re-evaluated prior to induction Oxygen Delivery Method: Circle system utilized Preoxygenation: Pre-oxygenation with 100% oxygen Induction Type: IV induction Ventilation: Mask ventilation without difficulty Laryngoscope Size: McGraph and 3 Grade View: Grade I Tube type: Oral Tube size: 7.0 mm Number of attempts: 1 Airway Equipment and Method: Stylet Placement Confirmation: ETT inserted through vocal cords under direct vision, positive ETCO2 and breath sounds checked- equal and bilateral Secured at: 21 cm Tube secured with: Tape Dental Injury: Teeth and Oropharynx as per pre-operative assessment

## 2020-12-08 NOTE — Op Note (Signed)
Operative Note    SURGERY DATE: 12/08/2020   PRE-OP DIAGNOSIS:  1. Right shoulder glenohumeral osteoarthritis 2. Right shoulder rotator cuff tear   POST-OP DIAGNOSIS:  1. Right shoulder glenohumeral osteoarthritis 2. Right shoulder rotator cuff tear 3. Right shoulder biceps tendinopathy  PROCEDURES:  1. Right reverse total shoulder arthroplasty 2. Right biceps tenodesis   SURGEON: Cato Mulligan, MD   ASSISTANT: Anitra Lauth, PA  ANESTHESIA: Gen + interscalene block   ESTIMATED BLOOD LOSS: 100cc   TOTAL IV FLUIDS: See anesthesia record  IMPLANTS: [place]ier: Standard 69mm baseplate with 6.5 x 01UX central screw; 5.85mm screws to baseplate x 4; 32TF eccentric (+2 inferior offset) glenosphere; Aequalis Ascend Flex size 2B short humeral stem; high (3.5) eccentricity reversed tray; reversed insert +17mm   INDICATION(S): [name] is a 75 y.o. female who sustained a glenohumeral joint dislocation on 09/07/2020.  This required reduction in the emergency department.  Since that time, the patient has had pseudoparalysis Of the extremity with inability to lift the arm.  Prior to this injury, the patient states she had relatively good function of the arm without significant dysfunction. Clinical exam and imaging were consistent with severe degenerative changes of the glenohumeral joint as well as massive rotator cuff tear with significant muscle atrophy consistent with rotator cuff arthropathy.  Patient had failed conservative management.  After discussion of risks, benefits, and alternatives to surgery, the patient elected to proceed with reverse shoulder arthroplasty and biceps tenodesis.   OPERATIVE FINDINGS: Complete tears of the supraspinatus and infraspinatus as well as subscapularis with significant retraction; significant biceps tendinopathy   OPERATIVE REPORT:   I identified [name] in the pre-operative holding area. Informed consent was obtained and the surgical  site was marked. I reviewed the risks and benefits of the proposed surgical intervention and the patient (and/or patient's guardian) wished to proceed. An interscalene block was administered by the Anesthesia team. The patient was transferred to the operative suite and general anesthesia was administered. The patient was placed in the beach chair position with the head of the bed elevated approximately 45 degrees. All down side pressure points were appropriately padded. Pre-op exam under anesthesia confirmed some stiffness and crepitus. Appropriate IV antibiotics were administered. Tranexamic acid was also administered. The extremity was then prepped and draped in standard fashion. A time out was performed confirming the correct extremity, correct patient, and correct procedure.   We used the standard deltopectoral incision from the coracoid to ~12cm distal. We found the cephalic vein and took it laterally.  We opened the deltopectoral interval widely and placed retractors under the CA ligament in the subacromial space and under the deltoid tendon at its insertion. We then abducted and internally rotated the arm and released the underlying bursa between these retractors, taking care not to damage the circumflex branch of the axillary nerve.   Next, we brought the arm back in adduction at slight forward flexion with external rotation. We opened the clavipectoral fascia lateral to the conjoint tendon. We gently palpated the axillary nerve and verified its position and continuity on both sides of the humerus with a Tug test. Note, this test was repeated multiple times during the procedure for nerve localization and confirmed to be intact at the end of the case. We then cauterized the anterior humeral circumflex ("Three sisters") vessels. The arm was then internally rotated, we cut the falciform ligament at approximately 1 cm of the upper portion of the pectoralis major insertion. Next we  unroofed the bicipital  groove.  There was a large loose body measuring approximately 3 x 2 x 1 cm in the bicipital groove.  This was removed.  Additional smaller loose bodies were also removed.  We proceeded with a soft tissue biceps tenodesis given the pathology of the tendon.  After opening the biceps tendon sheath all the way to the supraglenoid tubercle, we performed a biceps tenodesis with two #2 TiCron sutures to the upper border of the pectoralis major. The proximal portion of the tendon was excised.   Next, the remnants of the anterior capsule were peeled off of the lesser tuberosity and inferior humeral head.  We released the inferior capsule from the humerus all the way to the posterior band of the inferior glenohumeral ligament. When this was complete we gently dislocated the shoulder up into the wound. We removed any osteophytes and made our cut with the appropriate inclination in 30 degrees of retroversion. The humeral canal was sequentially broached to the appropriate size.     We then turned our attention to the glenoid. The proximal humerus was retracted posteriorly. The anterior capsule was dissected free from the retracted subscapularis. The anterior capsule was then excised, exposing the anterior glenoid. We then grasped the labrum and removed it circumferentially. During the glenoid exposure, the axillary nerve was protected the entire time.     A patient-specific guide was used to drill the central guidepin.  Osteophytes were carefully removed from the glenoid.  Standard instrumentation was performed.  The baseplate with 6.5 mm central screw was placed.  This allowed for excellent compression of the baseplate to the glenoid with appropriate seating. Next the compression screw was drilled and placed.  The remaining locking screws were also placed. The peripheral reamer was used to ensure appropriate glenosphere seating.  Next, the eccentric glenosphere with higher eccentricity portion inferior was impacted into  place.  The central screw of the glenosphere was tightened.   We then turned our attention back to the humerus. The trial tray and poly were placed and humerus was reduced.  The shoulder was trialed and noted to have satisfactory stability, motion, and deltoid tension with these implants.  The trial implants were removed. The humeral canal was pulse lavaged. Next, the humeral stem was placed with the appropriate retroversion. Stability was confirmed.  Humeral tray and poly insert were placed.  The humerus was reduced and motion, tension, and stability were satisfactory. A Hemovac drain was placed.  There was no subscapularis to repair.  We closed the deltopectoral interval with a running, 0-Vicryl suture. The skin was closed with 2-0 Vicryl and staples. Sterile dressing was applied. A PolarCare unit and sling were placed. Patient was extubated, transferred to a stretcher bed and to the post antesthesia care unit in stable condition.   Of note, assistance from a PA was essential to performing the surgery.  PA was present for the entire surgery.  PA assisted with patient positioning, retraction, instrumentation, and wound closure. The surgery would have been more difficult and had longer operative time without PA assistance.      POSTOPERATIVE PLAN: The patient will be admitted.  Perioperative antibiotics x24 hours.  Operative arm to remain in sling at all times except RoM exercises and hygiene. Can perform pendulums, elbow/wrist/hand RoM exercises. Passive RoM allowed to 90 FF and 30 ER. ASA 325mg  x 6 weeks for DVT ppx. Patient to return to clinic in ~2 weeks for post-operative appointment.

## 2020-12-08 NOTE — NC FL2 (Signed)
[place] LEVEL OF CARE SCREENING TOOL     IDENTIFICATION  Patient Name: [name] Birthdate: 01-Jan-1946 Sex: female Admission Date (Current Location): 12/08/2020  [facility] and [place] Number:  Engineering geologist and Address:  [facility], [address] [place], [place] [address]      Provider Number: [id number]  Attending Physician Name and Address:  Leim Fabry, MD  Relative Name and Phone Number:  Lavone Orn [phone]    Current Level of Care: Hospital Recommended Level of Care: [place] Prior Approval Number:    Date Approved/Denied:   PASRR Number: [id number]  Discharge Plan: SNF    Current Diagnoses: Patient Active Problem List   Diagnosis Date Noted   Rotator cuff arthropathy 12/08/2020   Pressure injury of skin 12/08/2020   Calculus of hepatic duct    Diabetes mellitus with peripheral angiopathy ([place]) 01/12/2018   Osteoarthritis 07/09/2016   Recurrent nephrolithiasis 07/09/2016   Benign essential hypertension 07/05/2016   History of CVA (cerebrovascular accident) 07/05/2016   Medicare annual wellness visit, initial 07/05/2016   Spinal stenosis of lumbar region with neurogenic claudication 04/19/2016   OSA on CPAP 12/29/2015   CKD (chronic kidney disease) stage 4, GFR 15-29 ml/min ([place]) 05/29/2015   H/O adenomatous polyp of colon 01/02/2015   Polyarticular gout 05/22/2014   Diabetes mellitus type 2, controlled, with complications ([place]) 02/77/4128   Foreign body in bladder and urethra 08/31/2013   Uric acid nephrolithiasis 08/31/2013   Kidney stone 08/09/2013   Ureteric stone 08/09/2013   Hydronephrosis 78/67/6720   Renal colic 94/70/9628    Orientation RESPIRATION BLADDER Height & Weight     Self, Time, Situation, Place  Normal Continent Weight: 95.3 kg Height:  5\' 4"  (162.6 cm)  BEHAVIORAL SYMPTOMS/MOOD NEUROLOGICAL BOWEL NUTRITION STATUS      Continent Diet (Regular)  AMBULATORY  STATUS COMMUNICATION OF NEEDS Skin   Extensive Assist Verbally Normal, Surgical wounds                       Personal Care Assistance Level of Assistance  Bathing, Dressing Bathing Assistance: Limited assistance   Dressing Assistance: Limited assistance     Functional Limitations Info             SPECIAL CARE FACTORS FREQUENCY  PT (By licensed PT), OT (By licensed OT)     PT Frequency: 5 times per week OT Frequency: 5 times per week            Contractures Contractures Info: Not present    Additional Factors Info  Code Status, Allergies Code Status Info: full Allergies Info: Lactose Intolerance Morphine And Related, Vicodin Hydrocodone-acetaminophen           Current Medications (12/08/2020):  This is the current hospital active medication list Current Facility-Administered Medications  Medication Dose Route Frequency Provider Last Rate Last Admin   0.9 %  sodium chloride infusion   Intravenous Continuous Leim Fabry, MD 75 mL/hr at 12/08/20 1206 New Bag at 12/08/20 1206   acetaminophen (TYLENOL) tablet 1,000 mg  1,000 mg Oral Q8H Leim Fabry, MD   1,000 mg at 12/08/20 1406   [START ON 12/09/2020] allopurinol (ZYLOPRIM) tablet 100 mg  100 mg Oral Daily Leim Fabry, MD       ALPRAZolam Duanne Moron) tablet 0.5 mg  0.5 mg Oral BID PRN Leim Fabry, MD       alum & mag hydroxide-simeth (MAALOX/MYLANTA) 200-200-20 MG/5ML suspension 30 mL  30  mL Oral Q4H PRN Leim Fabry, MD       Derrill Memo ON 12/09/2020] amLODipine (NORVASC) tablet 10 mg  10 mg Oral Daily Leim Fabry, MD       [START ON 12/09/2020] aspirin EC tablet 325 mg  325 mg Oral Daily Leim Fabry, MD       bisacodyl (DULCOLAX) suppository 10 mg  10 mg Rectal Daily PRN Leim Fabry, MD       ceFAZolin (ANCEF) IVPB 2g/100 mL premix  2 g Intravenous Q6H Leim Fabry, MD 200 mL/hr at 12/08/20 1413 2 g at 12/08/20 1413   docusate sodium (COLACE) capsule 100 mg  100 mg Oral BID Leim Fabry, MD       gabapentin (NEURONTIN)  capsule 100 mg  100 mg Oral QHS Lorna Dibble, RPH       [START ON 12/09/2020] gabapentin (NEURONTIN) capsule 200 mg  200 mg Oral q AM Beers, Shanon Brow, RPH       [START ON 12/09/2020] glipiZIDE (GLUCOTROL) tablet 5 mg  5 mg Oral QAC breakfast Leim Fabry, MD       HYDROmorphone (DILAUDID) injection 0.2-0.4 mg  0.2-0.4 mg Intravenous Q4H PRN Leim Fabry, MD       ketorolac (TORADOL) 15 MG/ML injection 7.5 mg  7.5 mg Intravenous Q6H Leim Fabry, MD   7.5 mg at 12/08/20 1407   menthol-cetylpyridinium (CEPACOL) lozenge 3 mg  1 lozenge Oral PRN Leim Fabry, MD       Or   phenol (CHLORASEPTIC) mouth spray 1 spray  1 spray Mouth/Throat PRN Leim Fabry, MD       metFORMIN (GLUCOPHAGE) tablet 500 mg  500 mg Oral BID WC Leim Fabry, MD       metoCLOPramide (REGLAN) tablet 5-10 mg  5-10 mg Oral Q8H PRN Leim Fabry, MD       Or   metoCLOPramide (REGLAN) injection 5-10 mg  5-10 mg Intravenous Q8H PRN Leim Fabry, MD       Derrill Memo ON 12/09/2020] metoprolol tartrate (LOPRESSOR) tablet 100 mg  100 mg Oral Daily Leim Fabry, MD       ondansetron [facility]) tablet 4 mg  4 mg Oral Q6H PRN Leim Fabry, MD       Or   ondansetron [facility]) injection 4 mg  4 mg Intravenous Q6H PRN Leim Fabry, MD       oxyCODONE (Oxy IR/ROXICODONE) immediate release tablet 10-15 mg  10-15 mg Oral Q4H PRN Leim Fabry, MD       oxyCODONE (Oxy IR/ROXICODONE) immediate release tablet 5-10 mg  5-10 mg Oral Q4H PRN Leim Fabry, MD   10 mg at 12/08/20 1553   rosuvastatin (CRESTOR) tablet 10 mg  10 mg Oral Daily Leim Fabry, MD   10 mg at 12/08/20 1407   senna-docusate (Senokot-S) tablet 1 tablet  1 tablet Oral QHS PRN Leim Fabry, MD       traMADol Veatrice Bourbon) tablet 50 mg  50 mg Oral Q6H PRN Leim Fabry, MD         Discharge Medications: Please see discharge summary for a list of discharge medications.  Relevant Imaging Results:  Relevant Lab Results:   Additional Information SS# Let[place],  RN

## 2020-12-08 NOTE — Anesthesia Preprocedure Evaluation (Addendum)
Anesthesia Evaluation  Patient identified by MRN, date of birth, ID band Patient awake    Reviewed: Allergy & Precautions, H&P , NPO status , Patient's Chart, lab work & pertinent test results, reviewed documented beta blocker date and time Preop documentation limited or incomplete due to emergent nature of procedure.  History of Anesthesia Complications (+) PONV and history of anesthetic complications  Airway Mallampati: IV  TM Distance: >3 FB Neck ROM: full    Dental  (+) Missing, Partial Upper, Dental Advidsory Given   Pulmonary neg shortness of breath, sleep apnea (history, but resolved since 100lb weight loss) , neg COPD, neg recent URI,    Pulmonary exam normal        Cardiovascular Exercise Tolerance: Good hypertension, (-) angina+ Peripheral Vascular Disease  (-) CAD, (-) Past MI, (-) Cardiac Stents and (-) CABG Normal cardiovascular exam+ dysrhythmias (1 episode after a prior anesthetic, none since that time) Atrial Fibrillation (-) Valvular Problems/Murmurs     Neuro/Psych neg Seizures CVA (right sided weakness), Residual Symptoms negative psych ROS   GI/Hepatic Neg liver ROS, GERD  Controlled,  Endo/Other  diabetes  Renal/GU CRFRenal disease  negative genitourinary   Musculoskeletal   Abdominal   Peds  Hematology negative hematology ROS (+)   Anesthesia Other Findings Past Medical History:   Hypertension                                                 Stroke                                                       Arthritis                                                    Diabetes mellitus without complication                       Chronic kidney disease                                       Osteopenia                                                   Sleep apnea                                                  Dysuria  Urine, incontinence, stress female                            Adenomatous colon polyp                                      Reproductive/Obstetrics negative OB ROS                            Anesthesia Physical  Anesthesia Plan  ASA: 3  Anesthesia Plan: General   Post-op Pain Management:  Regional for Post-op pain   Induction: Intravenous  PONV Risk Score and Plan: 4 or greater and Ondansetron, Dexamethasone, Treatment may vary due to age or medical condition, Aprepitant and Midazolam  Airway Management Planned: Oral ETT  Additional Equipment:   Intra-op Plan:   Post-operative Plan: Extubation in OR  Informed Consent: I have reviewed the patients History and Physical, chart, labs and discussed the procedure including the risks, benefits and alternatives for the proposed anesthesia with the patient or authorized representative who has indicated his/her understanding and acceptance.     Dental Advisory Given  Plan Discussed with: Anesthesiologist, CRNA and Surgeon  Anesthesia Plan Comments:        Anesthesia Quick Evaluation

## 2020-12-08 NOTE — H&P (Signed)
Paper H&P to be scanned into permanent record. H&P reviewed. No significant changes noted.  Patient started on Bactrim DS x7 days as of 12/06/20 after urine culture results.

## 2020-12-08 NOTE — Evaluation (Signed)
Physical Therapy Evaluation Patient Details Name: [name] MRN: [id number] DOB: Sep 11, 1945 Today's Date: 12/08/2020   History of Present Illness  Pt is a 75 y.o. F s/p R RSA, R bicep tenodesis on 12/08/20. PMH includes HTN, PAD, A-fib, CKD, and hx of CVA w/ R sided weakenss.  Clinical Impression  Pt alert working with OT seated at EOB beginning of treatment session. PLOF includes utilizing rollator for household distances. Pt and pt's daughter reported assistance is necessary for ADLs secondary to R knee pain. Pt notes having several falls within the last year due to R knee pain.  Pt requires mod-A for bed mobility to support trunk and RLE. Transfers require min-A w/hemiwalker for stability and support. Pt ambulated bed > recliner w/ hemi-walker, min-A for walker management. Due to change in PLOF, SNF is recommended at discharge. Skilled PT intervention is indicated to address deficits in function, mobility, and to return to PLOF as able.        Follow Up Recommendations SNF;Supervision for mobility/OOB    Equipment Recommendations  Other (comment) (TBD next venue of care)    Recommendations for Other Services       Precautions / Restrictions Precautions Precautions: Shoulder Type of Shoulder Precautions: PROM flexion 0-90 and ER 0-30. Can perform pendulums. Shoulder Interventions: At all times;Off for dressing/bathing/exercises;Don joy ultra sling Required Braces or Orthoses: Other Brace (abduction pillow) Restrictions Weight Bearing Restrictions: Yes RUE Weight Bearing: Non weight bearing      Mobility  Bed Mobility Overal bed mobility: Needs Assistance Bed Mobility: Supine to Sit     Supine to sit: Mod assist;HOB elevated     General bed mobility comments: trunk and R LE    Transfers Overall transfer level: Needs assistance Equipment used: Hemi-walker Transfers: Sit to/from Stand Sit to Stand: Min assist Stand pivot transfers: Mod assist           Ambulation/Gait Ambulation/Gait assistance: Min assist Gait Distance (Feet): 1 Feet Assistive device: Hemi-walker Gait Pattern/deviations: Step-to pattern     General Gait Details: Min-A for hemiwalker management  Stairs            Wheelchair Mobility    Modified Rankin (Stroke Patients Only)       Balance Overall balance assessment: Needs assistance Sitting-balance support: Feet supported;Single extremity supported Sitting balance-Leahy Scale: Fair     Standing balance support: During functional activity Standing balance-Leahy Scale: Poor Standing balance comment: Requires min-gaurd for safet;                             Pertinent Vitals/Pain Pain Assessment: 0-10 Pain Score: 2  Pain Location: R UE Pain Descriptors / Indicators: Discomfort;Guarding Pain Intervention(s): Limited activity within patient's tolerance;Monitored during session;Repositioned;Ice applied    Home Living Family/patient expects to be discharged to:: Skilled nursing facility Living Arrangements: Spouse/significant other               Additional Comments: Pt sleeps in lift chair    Prior Function Level of Independence: Needs assistance   Gait / Transfers Assistance Needed: rollator  ADL's / Homemaking Assistance Needed: shower seat  Comments: Pt notes increase support is necessary for mobility secondary to R knee pain     Hand Dominance   Dominant Hand: Right    Extremity/Trunk Assessment   Upper Extremity Assessment Upper Extremity Assessment: RUE deficits/detail RUE Deficits / Details: secondary to s/p limitations RUE: Unable to fully assess due to immobilization  Lower Extremity Assessment Lower Extremity Assessment: Overall WFL for tasks assessed (Able to raiseB LE against gravity, sensation intact dorsal/plantar surface of foot)       Communication   Communication: No difficulties  Cognition Arousal/Alertness: Awake/alert Behavior During  Therapy: WFL for tasks assessed/performed Overall Cognitive Status: Within Functional Limits for tasks assessed                                 General Comments: AOx4      General Comments General comments (skin integrity, edema, etc.): Pt noted increased lightheadedness with standing, difficulty achieving reliable BP in standing; Pt notes several falls within the last year, but none in the last 6 months 2/2 to R knee pain    Exercises     Assessment/Plan    PT Assessment Patient needs continued PT services  PT Problem List Decreased strength;Decreased range of motion;Decreased activity tolerance;Decreased balance;Decreased mobility;Decreased coordination       PT Treatment Interventions Gait training;Stair training;Functional mobility training;Therapeutic activities;Therapeutic exercise;Neuromuscular re-education;Balance training    PT Goals (Current goals can be found in the Care Plan section)  Acute Rehab PT Goals Patient Stated Goal: to go to rehab and get better PT Goal Formulation: With patient Time For Goal Achievement: 12/22/20 Potential to Achieve Goals: Good    Frequency 7X/week   Barriers to discharge        Co-evaluation               AM-PAC PT "6 Clicks" Mobility  Outcome Measure Help needed turning from your back to your side while in a flat bed without using bedrails?: A Lot Help needed moving from lying on your back to sitting on the side of a flat bed without using bedrails?: A Lot Help needed moving to and from a bed to a chair (including a wheelchair)?: A Lot Help needed standing up from a chair using your arms (e.g., wheelchair or bedside chair)?: A Little Help needed to walk in hospital room?: A Lot Help needed climbing 3-5 steps with a railing? : A Lot 6 Click Score: 13    End of Session Equipment Utilized During Treatment: Gait belt Activity Tolerance: Patient tolerated treatment well Patient left: in chair;with  family/visitor present;with call bell/phone within reach;with chair alarm set Nurse Communication: Mobility status PT Visit Diagnosis: Repeated falls (R29.6);Muscle weakness (generalized) (M62.81)    Time: 7614-7092 PT Time Calculation (min) (ACUTE ONLY): 47 min   Charges:              [employer], SPT

## 2020-12-08 NOTE — Evaluation (Signed)
Occupational Therapy Evaluation Patient Details Name: [name] MRN: [id number] DOB: November 16, 1945 Today's Date: 12/08/2020    History of Present Illness Pt is 75 y/o female s/p R reverse shoulder replacement. PMH of CVA, HTN, CKD, lumbar surgery, and L TKA.   Clinical Impression   Patient presenting with decreased I in self care, balance, functional mobility, transfers, endurance, and safety awareness. Patient reports living at home with husband with use of rollator for mobility PTA. Daughter present in the room and reports family has been assisting pt more and more because pt is having more difficulty with mobility and self care at home. Her husband is unable to provide any physical assistance. Patient currently mod - max A overall for mobility and self care tasks. Pt's BP seated on EOB is 119/58 and pt unable to stand long enough for BP to be measured in standing secondary to pt feeling dizzy and seeing "spiders on the wall". Pt taking several side steps L <>R with mod A and posterior bias. OT reviewed precautions and readjusted sling for comfort. Patient will benefit from acute OT to increase overall independence in the areas of ADLs, functional mobility, and safety awareness in order to safely discharge to next venue of care.     Follow Up Recommendations  SNF;Supervision/Assistance - 24 hour    Equipment Recommendations  Other (comment) (defer to next venue of care)       Precautions / Restrictions Precautions Precautions: Shoulder Type of Shoulder Precautions: PROM flexion 0-90 and ER 0-30. Can perform pendulums. Shoulder Interventions: At all times;Off for dressing/bathing/exercises;Don joy ultra sling Restrictions Weight Bearing Restrictions: Yes RUE Weight Bearing: Non weight bearing      Mobility Bed Mobility Overal bed mobility: Needs Assistance Bed Mobility: Supine to Sit     Supine to sit: Mod assist;HOB elevated     General bed mobility comments: trunk and  R LE    Transfers Overall transfer level: Needs assistance Equipment used: 1 person hand held assist Transfers: Sit to/from [facility] Sit to Stand: Mod assist Stand pivot transfers: Mod assist            Balance Overall balance assessment: Needs assistance Sitting-balance support: Feet supported Sitting balance-Leahy Scale: Fair     Standing balance support: During functional activity Standing balance-Leahy Scale: Poor                             ADL either performed or assessed with clinical judgement   ADL Overall ADL's : Needs assistance/impaired                                     Functional mobility during ADLs: Moderate assistance General ADL Comments: OT anticipates pt needing mod - max A for all aspects of self care and functional transfers based on evaluation. Pt needing mod lifting assistance to stand and for standing balance with side steps. Posterior bias noted. Total A for all R UE tasks.     Vision Patient Visual Report: No change from baseline              Pertinent Vitals/Pain Pain Assessment: 0-10 Pain Score: 6  Pain Location: R UE Pain Descriptors / Indicators: Aching;Discomfort;Guarding Pain Intervention(s): Limited activity within patient's tolerance;Monitored during session;Repositioned;RN gave pain meds during session     Hand Dominance Right   Extremity/Trunk Assessment Upper Extremity Assessment  Upper Extremity Assessment: RUE deficits/detail RUE Deficits / Details: secondary to surgery and limitations   Lower Extremity Assessment Lower Extremity Assessment: Generalized weakness       Communication Communication Communication: No difficulties   Cognition Arousal/Alertness: Awake/alert Behavior During Therapy: WFL for tasks assessed/performed                                   General Comments: Family reports pt has some confusion at baseline and pt reports, " I am just  loopy from this stuff (meds)".              Home Living Family/patient expects to be discharged to:: Skilled nursing facility Living Arrangements: Spouse/significant other                               Additional Comments: Pt sleeps in lift chair      Prior Functioning/Environment Level of Independence: Needs assistance  Gait / Transfers Assistance Needed: rollator ADL's / Homemaking Assistance Needed: shower seat   Comments: Daughter in room reports pt has been needing more and more assistance as shoulder and R knee have been getting worse. Pt reports R knee will also buckle without warning.        OT Problem List: Decreased strength;Pain;Impaired sensation;Decreased cognition;Decreased range of motion;Decreased activity tolerance;Decreased safety awareness;Impaired balance (sitting and/or standing);Decreased knowledge of use of DME or AE;Impaired vision/perception;Decreased knowledge of precautions      OT Treatment/Interventions: Self-care/ADL training;Modalities;Balance training;Therapeutic exercise;Therapeutic activities;Energy conservation;DME and/or AE instruction;Manual therapy;Patient/family education    OT Goals(Current goals can be found in the care plan section) Acute Rehab OT Goals Patient Stated Goal: to go to rehab and get better OT Goal Formulation: With patient/family Time For Goal Achievement: 12/22/20 Potential to Achieve Goals: Good ADL Goals Pt Will Perform Grooming: with modified independence;standing Pt Will Perform Upper Body Dressing: with min assist;sitting Pt Will Transfer to Toilet: with supervision;ambulating Pt Will Perform Toileting - Clothing Manipulation and hygiene: with supervision;sit to/from stand  OT Frequency: Min 2X/week   Barriers to D/C: Decreased caregiver support  Pt lives with husband who is unable to provide physical assistance.          AM-PAC OT "6 Clicks" Daily Activity     Outcome Measure Help from another  person eating meals?: A Little Help from another person taking care of personal grooming?: A Little Help from another person toileting, which includes using toliet, bedpan, or urinal?: A Lot Help from another person bathing (including washing, rinsing, drying)?: A Lot Help from another person to put on and taking off regular upper body clothing?: A Lot Help from another person to put on and taking off regular lower body clothing?: A Lot 6 Click Score: 14   End of Session Nurse Communication: Mobility status  Activity Tolerance: Patient limited by fatigue;Other (comment) (orthostatic) Patient left: Other (comment) (Pt seated on EOB with RN giving meds and daughter present in the room. PT arrives and pt transitions to that evaluation.)  OT Visit Diagnosis: Unsteadiness on feet (R26.81);Muscle weakness (generalized) (M62.81);History of falling (Z91.81);Pain Pain - Right/Left: Right Pain - part of body: Shoulder                Time: 4081-4481 OT Time Calculation (min): 35 min Charges:  OT General Charges $OT Visit: 1 Visit OT Evaluation $OT Eval Moderate Complexity: 1 Mod OT Treatments $  Self Care/Home Management : 8-22 mins $Therapeutic Activity: 8-22 mins  Darleen Crocker, MS, OTR/L , CBIS ascom [phone]  12/08/20, 3:31 PM

## 2020-12-08 NOTE — Transfer of Care (Signed)
Immediate Anesthesia Transfer of Care Note  Patient: [name]  Procedure(s) Performed: Right reverse total shoulder arthroplasty and  biceps tenodesis (Right: Shoulder)  Patient Location: PACU  Anesthesia Type:General  Level of Consciousness: drowsy  Airway & Oxygen Therapy: Patient Spontanous Breathing and Patient connected to face mask oxygen  Post-op Assessment: Report given to RN and Post -op Vital signs reviewed and stable  Post vital signs: Reviewed and stable  Last Vitals:  Vitals Value Taken Time  BP 133/66 12/08/20 1030  Temp    Pulse 58 12/08/20 1031  Resp 11 12/08/20 1031  SpO2 96 % 12/08/20 1031  Vitals shown include unvalidated device data.  Last Pain:  Vitals:   12/08/20 0621  TempSrc: Temporal  PainSc: 0-No pain         Complications: No notable events documented.

## 2020-12-08 NOTE — Anesthesia Procedure Notes (Signed)
Anesthesia Regional Block: Interscalene brachial plexus block   Pre-Anesthetic Checklist: , timeout performed,  Correct Patient, Correct Site, Correct Laterality,  Correct Procedure, Correct Position, site marked,  Risks and benefits discussed,  Surgical consent,  Pre-op evaluation,  At surgeon's request and post-op pain management  Laterality: Right and Upper  Prep: chloraprep       Needles:  Injection technique: Single-shot  Needle Type: Stimiplex     Needle Length: 5cm  Needle Gauge: 22     Additional Needles:   Procedures:,,,, ultrasound used (permanent image in chart),,    Narrative:  Start time: 12/08/2020 7:33 AM End time: 12/08/2020 7:36 AM Injection made incrementally with aspirations every 5 mL.  Performed by: Personally  Anesthesiologist: Martha Clan, MD  Additional Notes: Functioning IV was confirmed and monitors were applied.  A 60mm 22ga Stimuplex needle was used. Sterile prep and drape,hand hygiene and sterile gloves were used.  Negative aspiration and negative test dose prior to incremental administration of local anesthetic. The patient tolerated the procedure well.

## 2020-12-08 NOTE — Discharge Instructions (Signed)
[name] H. Kelsey Twitty, MD  [name]  Phone: [phone]  Fax: [phone]   Discharge Instructions after Reverse Shoulder Replacement    1. Activity/Sling: You are to be non-weight bearing on operative extremity. A sling/shoulder immobilizer has been provided for you. Only remove the sling to perform elbow, wrist, and hand RoM exercises and hygiene/dressing. Active reaching and lifting are not permitted. You will be given further instructions on sling use at your first physical therapy visit and postoperative visit with Dr. Jenavive Lamboy.   2. Dressings: Dressing may be removed at 1st physical therapy visit (~3-4 days after surgery). Afterwards, you may either leave open to air (if no drainage) or cover with dry, sterile dressing. If you have steri-strips on your wound, please do not remove them. They will fall off on their own. You may shower 5 days after surgery. Please pat incision dry. Do not rub or place any shear forces across incision. If there is drainage or any opening of incision after 5 days, please notify our offices immediately.    3. Driving:  Plan on not driving for six weeks. Please note that you are advised NOT to drive while taking narcotic pain medications as you may be impaired and unsafe to drive.   4. Medications:  - You have been provided a prescription for narcotic pain medicine (usually oxycodone). After surgery, take 1-2 narcotic tablets every 4 hours if needed for severe pain. Please start this as soon as you begin to start having pain (if you received a nerve block, start taking as soon as this wears off).  - A prescription for anti-nausea medication will be provided in case the narcotic medicine causes nausea - take 1 tablet every 6 hours only if nauseated.  - Take enteric coated aspirin 325 mg once daily for 6 weeks to prevent blood clots. Do not take aspirin if you have an aspirin sensitivity/allergy or asthma or are on an anticoagulant (blood thinner) already. If so, then  your home anticoagulant will be resume and managed - do not take aspirin. -Take tylenol 1000mg (2 Extra strength or 3 regular strength tablets) every 8 hours for pain. This will reduce the amount of narcotic medication needed. May stop tylenol when you are having minimal pain. - Take a stool softener (Colace, Dulcolax or Senakot) if you are using narcotic pain medications to help with constipation that is associated with narcotic use. - DO NOT take ANY nonsteroidal anti-inflammatory pain medications: Advil, Motrin, Ibuprofen, Aleve, Naproxen, or Naprosyn.   If you are taking prescription medication for anxiety, depression, insomnia, muscle spasm, chronic pain, or for attention deficit disorder you are advised that you are at a higher risk of adverse effects with use of narcotics post-op, including narcotic addiction/dependence, depressed breathing, death. If you use non-prescribed substances: alcohol, marijuana, cocaine, heroin, methamphetamines, etc., you are at a higher risk of adverse effects with use of narcotics post-op, including narcotic addiction/dependence, depressed breathing, death. You are advised that taking > 50 morphine milligram equivalents (MME) of narcotic pain medication per day results in twice the risk of overdose or death. For your prescription provided: oxycodone 5 mg - taking more than 6 tablets per day after the first few days of surgery.   5. Physical Therapy: 1-2 times per week for ~12 weeks. Therapy typically starts on post operative Day 3 or 4. You have been provided an order for physical therapy. The therapist will provide home exercises. Please contact our offices if this appointment has not been scheduled.      6. Work: May do light duty/desk job in approximately 2 weeks when off of narcotics, pain is well-controlled, and swelling has decreased if able to function with one arm in sling. Full work may take 6 weeks if light motions and function of both arms is required.  Lifting jobs may require 12 weeks.   7. Post-Op Appointments: Your first post-op appointment will be with Dr. [name] in approximately 2 weeks time.    If you find that they have not been scheduled please call the Orthopaedic Appointment front desk at [phone].                               [name] H. Kelsey Smeal, MD [name] Phone: [phone] Fax: [phone]   REVERSE SHOULDER ARTHROPLASTY REHAB GUIDELINES   These guidelines should be tailored to individual patients based on their rehab goals, age, precautions, quality of repair, etc.  Progression should be based on patient progress and approval by the referring physician.  PHASE 1 - Day 1 through Week 2  GENERAL GUIDELINES AND PRECAUTIONS Sling wear 24/7 except during grooming and home exercises (3 to 5 times daily) Avoid shoulder extension such that the arm is posterior the frontal plane.  When patients recline, a pillow should be placed behind the upper arm and sling should be on.  They should be advised to always be able to see the elbow Avoid combined IR/ADD/EXT, such as hand behind back to prevent dislocation Avoid combined IR and ADD such as reaching across the chest to prevent dislocation No AROM No submersion in pool/water for 4 weeks No weight bearing through operative arm (as in transfers, walker use, etc.)  GOALS Maintain integrity of joint replacement; protect soft tissue healing Increase PROM for elevation to 120 and ER to 30 (will remain the goal for first 6 weeks) Optimize distal UE circulation and muscle activity (elbow, wrist and hand) Instruct in use of sling for proper fit, polar care device for ice application after HEP, signs/symptoms of infection  EXERCISES Active elbow, wrist and hand Passive forward elevation in scapular plane to 90-120 max motion; ER in scapular plane to 30 Active scapular retraction with arms resting in neutral position  CRITERIA TO PROGRESS TO  PHASE 2 Low pain (less than 3/10) with shoulder PROM Healing of incision without signs of infection Clearance by MD to advance after 2 week MD check up  PHASE 2 - 2 weeks - 6 weeks  GENERAL GUIDELINES AND PRECAUTIONS Sling may be removed while at home; worn in community without abduction pillow May use arm for light activities of daily living (such as feeding, brushing teeth, dressing.) with elbow near  the side of the body  and arm in front of the body- no active lifting of the arm May submerge in water (tub, pool, Jacuzzi, etc.) after 4 weeks Continue to avoid WBing through the operative arm Continue to avoid combined IR/EXT/ADD (hand behind the back) and IR/ADD  (reaching across chest) for dislocation precautions  GOALS  Achieve passive elevation to 120 and ER to 30  Low (less than 3/10) to no pain  Ability to fire all heads of the deltoid  EXERCISES May discontinue grip, and active elbow and wrist exercises since using the arm in ADL's  with sling removed around the home Continue passive elevation to 120 and ER to 30, both in scapular plane with arm supported on table top Add submaximal isometrics, pain free effort, for all   functional heads of deltoid (anterior, posterior, middle)  Ensure that with posterior deltoid isometric the shoulder does not move into extension and the arm remains anterior the frontal plane At 4 weeks:  begin to place arm in balanced position of 90 deg elevation in supine; when patient able to hold this position with ease, may begin reverse pendulums clockwise and counterclockwise  CRITERIA TO PROGRESS TO PHASE 3 Passive forward elevation in scapular plane to 120; passive ER in scapular plane to 30 Ability to fire isometrically all heads of the deltoid muscle without pain Ability to place and hold the arm in balanced position (90 deg elevation in supine)  PHASE 3 - 6 weeks to 3 months  GENERAL GUIDELINES AND PRECAUTIONS Discontinue use of sling Avoid  forcing end range motion in any direction to prevent dislocation  May advance use of the arm actively in ADL's without being restricted to arm by the side of the body, however, avoid heavy lifting and sports (forever!) May initiate functional IR behind the back gently NO UPPER BODY ERGOMETER   GOALS Optimize PROM for elevation and ER in scapular plane with realistic expectation that max  mobility for elevation is usually around 145-160 passively; ER 40 to 50 passively; functional IR to L1 Recover AROM to approach as close to PROM available as possible; may expect 135-150 deg active elevation; 30 deg active ER; active functional IR to L1 Establish dynamic stability of the shoulder with deltoid and periscapular muscle gradual strengthening  EXERCISES Forward elevation in scapular plane active progression: supine to incline, to vertical; short to long lever arm Balanced position long lever arm AROM Active ER/IR with arm at side Scapular retraction with light band resistance Functional IR with hand slide up back - very gentle and gradual NO UPPER BODY ERGOMETER     CRITERIA TO PROGRESS TO PHASE 4  AROM equals/approaches PROM with good mechanics for elevation   No pain  Higher level demand on shoulder than ADL functions   PHASE 4 12 months and beyond  GENERAL GUIDELINES AND PRECAUTIONS No heavy lifting and no overhead sports No heavy pushing activity Gradually increase strength of deltoid and scapular stabilizers; also the rotator cuff if present with weights not to exceed 5 lbs NO UPPER BODY ERGOMETER   GOALS  Optimize functional use of the operative UE to meet the desired demands  Gradual increase in deltoid, scapular muscle, and rotator cuff strength  Pain free functional activities   EXERCISES Add light hand weights for deltoid up to and not to exceed 3 lbs for anterior and posterior with long arm lift against gravity; elbow bent to 90 deg for abduction in scapular  plane Theraband progression for extension to hip with scapular depression/retraction Theraband progression for serratus anterior punches in supine; avoid wall, incline or prone pressups for serratus anterior End range stretching gently without forceful overpressure in all planes (elevation in scapular plane, ER in scapular plane, functional IR) with stretching done for life as part of a daily routine NO UPPER BODY ERGOMETER     CRITERIA FOR DISCHARGE FROM SKILLED PHYSICAL THERAPY  Pain free AROM for shoulder elevation (expect around 135-150)  Functional strength for all ADL's, work tasks, and hobbies approved by surgeon  Independence with home maintenance program   NOTES: 1. With proper exercise, motion, strength, and function continue to improve even after one year. 2. The complication rate after surgery is 5 - 8%. Complications include infection, fracture, heterotopic bone formation, nerve injury, instability, rotator cuff   tear, and tuberosity nonunion. Please look for clinical signs, unusual symptoms, or lack of progress with therapy and report those to Dr. Cylinda Santoli. Prefer more communication than less.  3. The therapy plan above only serves as a guide. Please be aware of specific individualized patient instructions as written on the prescription or through discussions with the surgeon. 4. Please call Dr. Makyia Erxleben if you have any specific questions or concerns [phone]    

## 2020-12-09 ENCOUNTER — Encounter: Payer: Self-pay | Admitting: Orthopedic Surgery

## 2020-12-09 LAB — GLUCOSE, CAPILLARY
Glucose-Capillary: 198 mg/dL — ABNORMAL HIGH (ref 70–99)
Glucose-Capillary: 146 mg/dL — ABNORMAL HIGH (ref 70–99)
Glucose-Capillary: 178 mg/dL — ABNORMAL HIGH (ref 70–99)

## 2020-12-09 LAB — CBC
HCT: 43.2 % (ref 36.0–46.0)
Hemoglobin: 14.4 g/dL (ref 12.0–15.0)
MCH: 30.7 pg (ref 26.0–34.0)
MCHC: 33.3 g/dL (ref 30.0–36.0)
MCV: 92.1 fL (ref 80.0–100.0)
Platelets: 245 10*3/uL (ref 150–400)
RBC: 4.69 MIL/uL (ref 3.87–5.11)
RDW: 13.2 % (ref 11.5–15.5)
WBC: 13.9 10*3/uL — ABNORMAL HIGH (ref 4.0–10.5)
nRBC: 0 % (ref 0.0–0.2)

## 2020-12-09 LAB — BASIC METABOLIC PANEL
Anion gap: 6 (ref 5–15)
BUN: 32 mg/dL — ABNORMAL HIGH (ref 8–23)
CO2: 22 mmol/L (ref 22–32)
Calcium: 8.3 mg/dL — ABNORMAL LOW (ref 8.9–10.3)
Chloride: 104 mmol/L (ref 98–111)
Creatinine, Ser: 1.74 mg/dL — ABNORMAL HIGH (ref 0.44–1.00)
GFR, Estimated: 30 mL/min — ABNORMAL LOW (ref >60)
Glucose, Bld: 270 mg/dL — ABNORMAL HIGH (ref 70–99)
Potassium: 4.6 mmol/L (ref 3.5–5.1)
Sodium: 132 mmol/L — ABNORMAL LOW (ref 135–145)

## 2020-12-09 MED ORDER — OXYCODONE HCL 5 MG PO TABS: 5.0000 mg | ORAL_TABLET | ORAL | 0 refills | Status: DC | PRN

## 2020-12-09 MED ORDER — COVID-19 MRNA VACC (MODERNA) 50 MCG/0.25ML IM SUSP
0.2500 mL | Freq: Once | INTRAMUSCULAR | Status: AC
  Administered 2020-12-09: 0.25 mL via INTRAMUSCULAR
  Filled 2020-12-09: qty 0.25

## 2020-12-09 MED ORDER — ONDANSETRON HCL 4 MG PO TABS: 4.0000 mg | ORAL_TABLET | Freq: Four times a day (QID) | ORAL | 0 refills | Status: DC | PRN

## 2020-12-09 MED ORDER — INSULIN ASPART 100 UNIT/ML IJ SOLN: 0.0000 [IU] | Freq: Every day | INTRAMUSCULAR | Status: DC

## 2020-12-09 MED ORDER — INSULIN ASPART 100 UNIT/ML IJ SOLN
0.0000 [IU] | Freq: Three times a day (TID) | INTRAMUSCULAR | Status: DC
  Administered 2020-12-09: 2 [IU] via SUBCUTANEOUS
  Administered 2020-12-09 – 2020-12-10 (×2): 3 [IU] via SUBCUTANEOUS
  Administered 2020-12-10: 5 [IU] via SUBCUTANEOUS
  Administered 2020-12-11: 3 [IU] via SUBCUTANEOUS
  Filled 2020-12-09 (×5): qty 1

## 2020-12-09 MED ORDER — ASPIRIN 325 MG PO TBEC: 325.0000 mg | DELAYED_RELEASE_TABLET | Freq: Every day | ORAL | 0 refills | Status: DC

## 2020-12-09 MED ORDER — TRAMADOL HCL 50 MG PO TABS: 50.0000 mg | ORAL_TABLET | Freq: Four times a day (QID) | ORAL | 0 refills | Status: DC | PRN

## 2020-12-09 NOTE — Progress Notes (Signed)
Occupational Therapy Treatment Patient Details Name: [name] MRN: [id number] DOB: 1946/01/25 Today's Date: 12/09/2020    History of present illness Pt is a 75 y.o. F s/p R RSA, R bicep tenodesis on 12/08/20. PMH includes HTN, PAD, A-fib, CKD, and hx of CVA w/ R sided weakenss.   OT comments  Upon entering the room, pt supine in bed and sleeping soundly. Pt with 5/10 c/o knee pain and reports sleeping unwell and some anxiety in regards to current situation. Pt very pleasant and cooperative this session. OT removed sling and polar care and again discussed precautions. OT gave detailed instructions and pt performed 3 sets of 10 AROM exercise for R hand and wrist. OT provided PROM exercises for ER 0-30 and flexion 0-90 with 3 sets of 10 and pt reporting no pain with exercise. OT repositioned pt in bed for comfort and adjusted sling for proper positioning. OT assisted pt with calling her family before exiting the room. Pt does report continued R knee pain even in supine. All needs within reach and pt continues to benefit from OT intervention with recommendation for short term rehab at discharge.    Follow Up Recommendations  SNF;Supervision/Assistance - 24 hour    Equipment Recommendations  Other (comment) (defer to next venue of care)       Precautions / Restrictions Precautions Precautions: Shoulder Type of Shoulder Precautions: PROM flexion 0-90 and ER 0-30. Can perform pendulums. Shoulder Interventions: At all times;Off for dressing/bathing/exercises;Don joy ultra sling;Shoulder abduction pillow Restrictions Weight Bearing Restrictions: Yes RUE Weight Bearing: Non weight bearing              ADL either performed or assessed with clinical judgement     Vision Patient Visual Report: No change from baseline            Cognition Arousal/Alertness: Awake/alert Behavior During Therapy: WFL for tasks assessed/performed Overall Cognitive Status: Within Functional Limits  for tasks assessed                                          Exercises Shoulder Exercises Shoulder Flexion: PROM;Right;Supine;Other (comment) (0-90 degrees 3 sets of 10) Wrist Flexion: AROM;Supine;Other (comment) (3 sets of 10) Wrist Extension: AROM;Supine (3 sets of 10) Digit Composite Flexion: AROM (3 sets of 10) Composite Extension: AROM (3 sets of 10)           Pertinent Vitals/ Pain       Pain Assessment: 0-10 Pain Score: 5  Pain Location: R knee Pain Descriptors / Indicators: Aching;Discomfort Pain Intervention(s): Limited activity within patient's tolerance;Repositioned;Premedicated before session   Frequency  Min 2X/week        Progress Toward Goals  OT Goals(current goals can now be found in the care plan section)  Progress towards OT goals: Progressing toward goals  Acute Rehab OT Goals Patient Stated Goal: to go to rehab and get better OT Goal Formulation: With patient/family Time For Goal Achievement: 12/22/20 Potential to Achieve Goals: Good  Plan Discharge plan remains appropriate;Frequency remains appropriate       AM-PAC OT "6 Clicks" Daily Activity     Outcome Measure   Help from another person eating meals?: A Little Help from another person taking care of personal grooming?: A Little Help from another person toileting, which includes using toliet, bedpan, or urinal?: A Lot Help from another person bathing (including washing, rinsing, drying)?: A Lot  Help from another person to put on and taking off regular upper body clothing?: A Lot Help from another person to put on and taking off regular lower body clothing?: A Lot 6 Click Score: 14    End of Session    OT Visit Diagnosis: Unsteadiness on feet (R26.81);Muscle weakness (generalized) (M62.81);History of falling (Z91.81);Pain   Activity Tolerance Patient limited by fatigue   Patient Left in bed;with call bell/phone within reach;with bed alarm set   Nurse Communication  Mobility status        Time: 5956-3875 OT Time Calculation (min): 42 min  Charges: OT General Charges $OT Visit: 1 Visit OT Treatments $Therapeutic Activity: 8-22 mins $Therapeutic Exercise: 23-37 mins  Darleen Crocker, MS, OTR/L , CBIS ascom [phone]  12/09/20, 11:41 AM

## 2020-12-09 NOTE — Progress Notes (Signed)
Vitals entered manually ° °

## 2020-12-09 NOTE — Anesthesia Postprocedure Evaluation (Signed)
Anesthesia Post Note  Patient: Breniyah Romm  Procedure(s) Performed: Right reverse total shoulder arthroplasty and  biceps tenodesis (Right: Shoulder)  Patient location during evaluation: PACU Anesthesia Type: General Level of consciousness: awake and alert Pain management: pain level controlled Vital Signs Assessment: post-procedure vital signs reviewed and stable Respiratory status: spontaneous breathing, nonlabored ventilation, respiratory function stable and patient connected to nasal cannula oxygen Cardiovascular status: blood pressure returned to baseline and stable Postop Assessment: no apparent nausea or vomiting Anesthetic complications: no   No notable events documented.   Last Vitals:  Vitals:   12/09/20 0040 12/09/20 0501  BP: 119/73 130/72  Pulse: (!) 58 (!) 55  Resp: 17 17  Temp: 36.8 C 36.8 C  SpO2: 93% 94%    Last Pain:  Vitals:   12/09/20 0346  TempSrc:   PainSc: 8                  Martha Clan

## 2020-12-09 NOTE — Progress Notes (Signed)
Physical Therapy Treatment Patient Details Name: [name] MRN: [id number] DOB: [birth year] Today's Date: 12/09/2020    History of Present Illness Pt is a 75 y.o. F s/p R RSA, R bicep tenodesis on 12/08/20. PMH includes HTN, PAD, A-fib, CKD, and hx of CVA w/ R sided weakenss.    PT Comments    Pt alert, talkative with therapy. Pt requires min-A with HOB elevated for trunk and LE assistance. Sit to stand w/ hemi-walker x 2, min-A for momentum and support. Progressed ambulation to 7 ft w/ chair follow, hemi-walker, min-A for walker maneuvering. Pt noted increased RLE weakness with high fatigue levels of 9/10 with in room ambulation limiting further distance. Skilled PT intervention is indicated to address deficits in function, mobility, and to return to PLOF as able.  Discharge recommendations are SNF.    Follow Up Recommendations  SNF;Supervision for mobility/OOB     Equipment Recommendations  Other (comment) (TBD next venue of care)    Recommendations for Other Services       Precautions / Restrictions Precautions Precautions: Shoulder Type of Shoulder Precautions: PROM flexion 0-90 and ER 0-30. Can perform pendulums. Shoulder Interventions: At all times;Off for dressing/bathing/exercises;Don joy ultra sling;Shoulder abduction pillow Required Braces or Orthoses: Sling Restrictions Weight Bearing Restrictions: Yes RUE Weight Bearing: Non weight bearing    Mobility  Bed Mobility Overal bed mobility: Needs Assistance Bed Mobility: Supine to Sit     Supine to sit: Min assist     General bed mobility comments: trunk and R LE    Transfers Overall transfer level: Needs assistance Equipment used: Hemi-walker Transfers: Sit to/from Stand Sit to Stand: Min assist            Ambulation/Gait Ambulation/Gait assistance: Herbalist (Feet): 7 Feet Assistive device: Hemi-walker Gait Pattern/deviations: Step-to pattern Gait velocity: Decreased    General Gait Details: Gait limited 2/2 R knee pain, perceived weakness   Stairs             Wheelchair Mobility    Modified Rankin (Stroke Patients Only)       Balance Overall balance assessment: Needs assistance Sitting-balance support: Feet supported;Single extremity supported Sitting balance-Leahy Scale: Fair     Standing balance support: Single extremity supported;During functional activity Standing balance-Leahy Scale: Poor                              Cognition Arousal/Alertness: Awake/alert Behavior During Therapy: WFL for tasks assessed/performed Overall Cognitive Status: Within Functional Limits for tasks assessed                                        Exercises Other Exercises Other Exercises: Bed > chair transfer x1 , hemi-walker, min-A    General Comments        Pertinent Vitals/Pain Pain Assessment: 0-10 Pain Score: 6  Pain Location: R knee Pain Descriptors / Indicators: Aching;Discomfort Pain Intervention(s): Limited activity within patient's tolerance;Monitored during session;Repositioned    Home Living                      Prior Function            PT Goals (current goals can now be found in the care plan section) Acute Rehab PT Goals Patient Stated Goal: to go to rehab and get better PT Goal Formulation:  With patient Time For Goal Achievement: 12/22/20 Potential to Achieve Goals: Good Progress towards PT goals: Progressing toward goals    Frequency    7X/week      PT Plan Current plan remains appropriate    Co-evaluation              AM-PAC PT "6 Clicks" Mobility   Outcome Measure  Help needed turning from your back to your side while in a flat bed without using bedrails?: A Lot Help needed moving from lying on your back to sitting on the side of a flat bed without using bedrails?: A Little Help needed moving to and from a bed to a chair (including a wheelchair)?: A Lot Help  needed standing up from a chair using your arms (e.g., wheelchair or bedside chair)?: A Little Help needed to walk in hospital room?: A Lot Help needed climbing 3-5 steps with a railing? : A Lot 6 Click Score: 14    End of Session Equipment Utilized During Treatment: Gait belt Activity Tolerance: Patient tolerated treatment well;Patient limited by pain (R knee pain) Patient left: in chair;with family/visitor present;with call bell/phone within reach;with chair alarm set;with SCD's reapplied Nurse Communication: Mobility status PT Visit Diagnosis: Repeated falls (R29.6);Muscle weakness (generalized) (M62.81)     Time: 8088-1103 PT Time Calculation (min) (ACUTE ONLY): 47 min  Charges:                        [employer], SPT

## 2020-12-09 NOTE — TOC Progression Note (Addendum)
Transition of Care [facility]) - Progression Note    Patient Details  Name: [name] MRN: [id number] Date of Birth: [birth year]  Transition of Care [facility]) CM/SW [place], RN Phone Number: 12/09/2020, 1:22 PM  Clinical Narrative:     Reviewed bed offer with the patient and start ratings, she chose [facility], I notified Tonya at [facility], insurance to be obtained by [facility] regular [employer]       Expected Discharge Plan and Services                                                 Social Determinants of Health (SDOH) Interventions    Readmission Risk Interventions No flowsheet data found.

## 2020-12-09 NOTE — Progress Notes (Signed)
  Subjective: 1 Day Post-Op Procedure(s) (LRB): Right reverse total shoulder arthroplasty and  biceps tenodesis (Right) Patient reports pain as mild.  She is having some anxiety and feels like she is shaking.  She did not sleep well last night. Patient is well, and has had no acute complaints or problems Plan is to go Rehab after hospital stay. Negative for chest pain and shortness of breath Fever: no Gastrointestinal: Negative for nausea and vomiting  Objective: Vital signs in last 24 hours: Temp:  [96.8 F (36 C)-98.3 F (36.8 C)] 98.2 F (36.8 C) (08/02 0501) Pulse Rate:  [55-65] 55 (08/02 0501) Resp:  [8-17] 17 (08/02 0501) BP: (119-148)/(60-76) 130/72 (08/02 0501) SpO2:  [84 %-100 %] 94 % (08/02 0501)  Intake/Output from previous day:  Intake/Output Summary (Last 24 hours) at 12/09/2020 0710 Last data filed at 12/09/2020 0043 Gross per 24 hour  Intake 1170.01 ml  Output 660 ml  Net 510.01 ml    Intake/Output this shift: No intake/output data recorded.  Labs: Recent Labs    12/09/20 0419  HGB 14.4   Recent Labs    12/09/20 0419  WBC 13.9*  RBC 4.69  HCT 43.2  PLT 245   Recent Labs    12/08/20 1150 12/09/20 0419  NA 137 132*  K 4.5 4.6  CL 109 104  CO2 21* 22  BUN 26* 32*  CREATININE 1.68* 1.74*  GLUCOSE 196* 270*  CALCIUM 8.7* 8.3*   No results for input(s): LABPT, INR in the last 72 hours.   EXAM General - Patient is Alert and Oriented Extremity - Neurovascular intact Sensation intact distally Dorsiflexion/Plantar flexion intact Dressing/Incision - clean, dry, no drainage, with the Hemovac removed Motor Function - intact, moving fingers and elbow well on exam.   Past Medical History:  Diagnosis Date   A-fib ([place])    back in 2013, for stent placement in kidney, she had a bout of a-fib.  Now, back in rhythm   Arthritis    Chronic kidney disease    Complication of anesthesia    Diabetes mellitus without complication ([place])    type 2    only  takes metformin   Dyspnea    Dysuria    History of kidney stones    Hypertension    Osteopenia    PONV (postoperative nausea and vomiting)    Sleep apnea    lost over 120 lbs, and no long uses it (close to 2 yrs now)   Stroke [facility])    mini 2003--affected right side of body.   took 6 weeks to get over.   Urine, incontinence, stress female     Assessment/Plan: 1 Day Post-Op Procedure(s) (LRB): Right reverse total shoulder arthroplasty and  biceps tenodesis (Right) Active Problems:   Rotator cuff arthropathy   Pressure injury of skin  Estimated body mass index is 36.05 kg/m as calculated from the following:   Height as of this encounter: 5\' 4"  (1.626 m).   Weight as of this encounter: 95.3 kg. Advance diet Up with therapy D/C IV fluids Discharge to SNF when bed offer from care management  DVT Prophylaxis - Aspirin Shoulder immobilizer to right shoulder all times except for bathing and physical therapy with range of motion  Reche Dixon, PA-C Orthopaedic Surgery 12/09/2020, 7:10 AM

## 2020-12-09 NOTE — Discharge Summary (Addendum)
Physician Discharge Summary  Subjective: 3 Days Post-Op Procedure(s) (LRB): Right reverse total shoulder arthroplasty and  biceps tenodesis (Right) Patient reports pain as mild.   Patient seen in rounds with Dr. Posey Pronto. Patient is well, and has had no acute complaints or problems.  She is having some anxiety and did not sleep well. Patient is ready to go to rehab for physical therapy  Physician Discharge Summary  Patient ID: [name] MRN: [id number] DOB/AGE: 04/22/1946 75 y.o.  Admit date: 12/08/2020 Discharge date: 12/11/2020  Admission Diagnoses:  Discharge Diagnoses:  Active Problems:   Rotator cuff arthropathy   Pressure injury of skin   Discharged Condition: fair  Hospital Course: The patient is postop day 1 from a reverse shoulder replacement done by Dr. Posey Pronto.  Her vitals have remained stable.  She has had minimal blood loss.  Her drain was removed this morning.  She did some physical therapy yesterday to the chair.  She is anxious and has been shaking some during the night.  She did not sleep well.  Treatments: surgery:  1. Right reverse total shoulder arthroplasty 2. Right biceps tenodesis   SURGEON: Cato Mulligan, MD   ASSISTANT: Anitra Lauth, PA   ANESTHESIA: Gen + interscalene block   ESTIMATED BLOOD LOSS: 100cc   TOTAL IV FLUIDS: See anesthesia record   IMPLANTS: [place]: Standard 86mm baseplate with 6.5 x 42VZ central screw; 5.5mm screws to baseplate x 4; 56LO eccentric (+2 inferior offset) glenosphere; Aequalis Ascend Flex size 2B short humeral stem; high (3.5) eccentricity reversed tray; reversed insert +56mm  Discharge Exam: Blood pressure (!) 149/68, pulse 65, temperature 98.9 F (37.2 C), resp. rate 16, height 5\' 4"  (1.626 m), weight 95.3 kg, SpO2 95 %.   Disposition: Discharge disposition: 03-Skilled Nursing Facility       Allergies as of 12/11/2020       Reactions   Lactose Intolerance (gi)    Milk    Morphine And Related  Nausea And Vomiting   Vicodin [hydrocodone-acetaminophen] Nausea And Vomiting   Tolerates acetaminophen        Medication List     STOP taking these medications    aspirin 81 MG tablet Replaced by: aspirin 325 MG EC tablet   oxyCODONE-acetaminophen 5-325 MG tablet Commonly known as: PERCOCET/ROXICET       TAKE these medications    allopurinol 100 MG tablet Commonly known as: ZYLOPRIM Take 100 mg by mouth daily.   ALPRAZolam 0.5 MG tablet Commonly known as: XANAX Take 0.5 mg by mouth 2 (two) times daily as needed for anxiety. Notes to patient: Not given in hospital   amLODipine 10 MG tablet Commonly known as: NORVASC Take 10 mg by mouth daily.   aspirin 325 MG EC tablet Take 1 tablet (325 mg total) by mouth daily. Replaces: aspirin 81 MG tablet   gabapentin 100 MG capsule Commonly known as: NEURONTIN Take 100-200 mg by mouth See admin instructions. Take two capsules (200 mg) every morning and one capsule (100 mg) every evening   glipiZIDE 5 MG tablet Commonly known as: GLUCOTROL Take 5 mg by mouth daily before breakfast.   metFORMIN 500 MG tablet Commonly known as: GLUCOPHAGE Take 500 mg by mouth 2 (two) times daily with a meal.   metoprolol tartrate 100 MG tablet Commonly known as: LOPRESSOR Take 100 mg by mouth daily.   nitrofurantoin (macrocrystal-monohydrate) 100 MG capsule Commonly known as: MACROBID Take 100 mg by mouth daily. Notes to patient: Not given in hospital  ondansetron 4 MG tablet Commonly known as: ZOFRAN Take 1 tablet (4 mg total) by mouth every 6 (six) hours as needed for nausea.   oxyCODONE 5 MG immediate release tablet Commonly known as: Oxy IR/ROXICODONE Take 1-2 tablets (5-10 mg total) by mouth every 4 (four) hours as needed for moderate pain (pain score 4-6).   rosuvastatin 10 MG tablet Commonly known as: CRESTOR Take 10 mg by mouth daily.   traMADol 50 MG tablet Commonly known as: ULTRAM Take 1 tablet (50 mg total) by  mouth every 6 (six) hours as needed for moderate pain.       ASK your doctor about these medications    furosemide 20 MG tablet Commonly known as: LASIX Take 20 mg by mouth daily as needed for edema.   OVER THE COUNTER MEDICATION Apply 1 application topically daily as needed (pain). CBD Cream   vitamin B-12 500 MCG tablet Commonly known as: CYANOCOBALAMIN Take 500 mcg by mouth daily.        Contact information for follow-up providers     Leim Fabry, MD. Go on 12/24/2020.   Specialty: Orthopedic Surgery Why: For staple removal;  Appt @ 9:00 am Contact information: [place] [address] [phone]              Contact information for after-discharge care     Destination     [facility] Preferred SNF .   Service: Skilled Nursing Contact information: [address] [place] [phone]                     Signed: Prescott Parma, Damesha Lawler 12/11/2020, 8:26 AM   Objective: Vital signs in last 24 hours: Temp:  [97.7 F (36.5 C)-98.9 F (37.2 C)] 98.9 F (37.2 C) (08/04 0741) Pulse Rate:  [63-73] 65 (08/04 0741) Resp:  [16-19] 16 (08/04 0741) BP: (126-153)/(64-76) 149/68 (08/04 0741) SpO2:  [93 %-97 %] 95 % (08/04 0741)  Intake/Output from previous day:  Intake/Output Summary (Last 24 hours) at 12/11/2020 0826 Last data filed at 12/11/2020 0447 Gross per 24 hour  Intake 680 ml  Output 550 ml  Net 130 ml    Intake/Output this shift: No intake/output data recorded.  Labs: Recent Labs    12/09/20 0419  HGB 14.4   Recent Labs    12/09/20 0419  WBC 13.9*  RBC 4.69  HCT 43.2  PLT 245   Recent Labs    12/08/20 1150 12/09/20 0419  NA 137 132*  K 4.5 4.6  CL 109 104  CO2 21* 22  BUN 26* 32*  CREATININE 1.68* 1.74*  GLUCOSE 196* 270*  CALCIUM 8.7* 8.3*   No results for input(s): LABPT, INR in the last 72 hours.  EXAM: General - Patient is Alert and Oriented Extremity -  Neurovascular intact Sensation intact distally Dorsiflexion/Plantar flexion intact Incision - clean, dry, with the Hemovac removed Motor Function -grip strength intact.  Able to give shoulder posterior pressure.  Assessment/Plan: 3 Days Post-Op Procedure(s) (LRB): Right reverse total shoulder arthroplasty and  biceps tenodesis (Right) Procedure(s) (LRB): Right reverse total shoulder arthroplasty and  biceps tenodesis (Right) Past Medical History:  Diagnosis Date   A-fib ([place])    back in 2013, for stent placement in kidney, she had a bout of a-fib.  Now, back in rhythm   Arthritis    Chronic kidney disease    Complication of anesthesia    Diabetes mellitus without complication ([place])  type 2    only takes metformin   Dyspnea    Dysuria    History of kidney stones    Hypertension    Osteopenia    PONV (postoperative nausea and vomiting)    Sleep apnea    lost over 120 lbs, and no long uses it (close to 2 yrs now)   Stroke [facility])    mini 2003--affected right side of body.   took 6 weeks to get over.   Urine, incontinence, stress female    Active Problems:   Rotator cuff arthropathy   Pressure injury of skin  Estimated body mass index is 36.05 kg/m as calculated from the following:   Height as of this encounter: 5\' 4"  (1.626 m).   Weight as of this encounter: 95.3 kg. Advance diet Up with therapy D/C IV fluids Discharge to SNF when bed offer through care management Diet - Regular diet Follow up - in 2 weeks Activity - WBAT with the shoulder immobilizer on at all times.  She will remove it only for range of motion and bathing. Disposition - Rehab Condition Upon Discharge - Stable DVT Prophylaxis - Aspirin  Reche Dixon, PA-C Orthopaedic Surgery 12/11/2020, 8:26 AM

## 2020-12-10 ENCOUNTER — Encounter: Payer: Self-pay | Admitting: Orthopedic Surgery

## 2020-12-10 LAB — GLUCOSE, CAPILLARY
Glucose-Capillary: 193 mg/dL — ABNORMAL HIGH (ref 70–99)
Glucose-Capillary: 202 mg/dL — ABNORMAL HIGH (ref 70–99)
Glucose-Capillary: 109 mg/dL — ABNORMAL HIGH (ref 70–99)
Glucose-Capillary: 184 mg/dL — ABNORMAL HIGH (ref 70–99)

## 2020-12-10 LAB — SURGICAL PATHOLOGY

## 2020-12-10 NOTE — TOC Progression Note (Addendum)
Transition of Care [name]) - Progression Note    Patient Details  Name: [name] MRN: [id number] Date of Birth: 10/17/1945  Transition of Care [facility]) CM/SW [place], RN Phone Number: 12/10/2020, 8:58 AM  Clinical Narrative:    Reached out to [facility] checking on status of the insurance auth, awaiting a responce  Received notification that Josem Kaufmann is still pending       Expected Discharge Plan and Services           Expected Discharge Date: 12/10/20                                     Social Determinants of Health (SDOH) Interventions    Readmission Risk Interventions No flowsheet data found.

## 2020-12-10 NOTE — Plan of Care (Signed)
No acute events during the night. VSS. Bed low. Wheels locked. Requested prn xanex during the night.

## 2020-12-10 NOTE — Progress Notes (Signed)
  Subjective: 2 Days Post-Op Procedure(s) (LRB): Right reverse total shoulder arthroplasty and  biceps tenodesis (Right) Patient reports pain as mild.  She is much improved this morning.  Her anxiety has diminished. Patient is well, and has had no acute complaints or problems Plan is to go Rehab after hospital stay. Negative for chest pain and shortness of breath Fever: no Gastrointestinal: Negative for nausea and vomiting  Objective: Vital signs in last 24 hours: Temp:  [97.7 F (36.5 C)-98.2 F (36.8 C)] 97.8 F (36.6 C) (08/03 0406) Pulse Rate:  [59-65] 62 (08/03 0406) Resp:  [15-18] 15 (08/03 0406) BP: (128-146)/(57-77) 134/77 (08/03 0406) SpO2:  [93 %-96 %] 94 % (08/03 0406)  Intake/Output from previous day:  Intake/Output Summary (Last 24 hours) at 12/10/2020 0708 Last data filed at 12/10/2020 0406 Gross per 24 hour  Intake --  Output 300 ml  Net -300 ml    Intake/Output this shift: No intake/output data recorded.  Labs: Recent Labs    12/09/20 0419  HGB 14.4   Recent Labs    12/09/20 0419  WBC 13.9*  RBC 4.69  HCT 43.2  PLT 245   Recent Labs    12/08/20 1150 12/09/20 0419  NA 137 132*  K 4.5 4.6  CL 109 104  CO2 21* 22  BUN 26* 32*  CREATININE 1.68* 1.74*  GLUCOSE 196* 270*  CALCIUM 8.7* 8.3*   No results for input(s): LABPT, INR in the last 72 hours.   EXAM General - Patient is Alert and Oriented Extremity - Neurovascular intact Sensation intact distally Dorsiflexion/Plantar flexion intact Dressing/Incision - clean, dry, no drainage Motor Function - intact, moving fingers and elbow well on exam.  She ambulated 7 feet with physical therapy  Past Medical History:  Diagnosis Date   A-fib ([place])    back in 2013, for stent placement in kidney, she had a bout of a-fib.  Now, back in rhythm   Arthritis    Chronic kidney disease    Complication of anesthesia    Diabetes mellitus without complication ([place])    type 2    only takes metformin    Dyspnea    Dysuria    History of kidney stones    Hypertension    Osteopenia    PONV (postoperative nausea and vomiting)    Sleep apnea    lost over 120 lbs, and no long uses it (close to 2 yrs now)   Stroke [facility])    mini 2003--affected right side of body.   took 6 weeks to get over.   Urine, incontinence, stress female     Assessment/Plan: 2 Days Post-Op Procedure(s) (LRB): Right reverse total shoulder arthroplasty and  biceps tenodesis (Right) Active Problems:   Rotator cuff arthropathy   Pressure injury of skin  Estimated body mass index is 36.05 kg/m as calculated from the following:   Height as of this encounter: 5\' 4"  (1.626 m).   Weight as of this encounter: 95.3 kg. Advance diet Up with therapy D/C IV fluids Discharge to SNF today  DVT Prophylaxis - Aspirin Shoulder immobilizer to right shoulder all times except for bathing and physical therapy with range of motion  Reche Dixon, PA-C Orthopaedic Surgery 12/10/2020, 7:08 AM

## 2020-12-10 NOTE — Progress Notes (Addendum)
Physical Therapy Treatment Patient Details Name: [name] MRN: [id number] DOB: Feb 05, 1946 Today's Date: 12/10/2020    History of Present Illness Pt is a 75 y.o. F s/p R RSA, R bicep tenodesis on 12/08/20. PMH includes HTN, PAD, A-fib, CKD, and hx of CVA w/ R sided weakenss.    PT Comments    Pt seated in recliner, enthusiastic for therapy. Performed sit <> stand x 3 w/ hemi-walker, min-A. Pt demonstrated improvement in technique following multi-modal cues. Static stance remains limited secondary to R knee pain and decreased balance relying on LLE. Pt able to ambulate 5 ft in room with min-A for hemi-walker maneuvering and support for stability. Skilled PT intervention is indicated to address deficits in function, mobility, and to return to PLOF as able.  Discharge recommendations are SNF.     Follow Up Recommendations  SNF;Supervision for mobility/OOB     Equipment Recommendations     TBD at Next Venue of Care  Recommendations for Other Services       Precautions / Restrictions Precautions Precautions: Shoulder Type of Shoulder Precautions: PROM flexion 0-90 and ER 0-30. Can perform pendulums. Shoulder Interventions: At all times;Off for dressing/bathing/exercises;Don joy ultra sling;Shoulder abduction pillow Required Braces or Orthoses: Sling Restrictions Weight Bearing Restrictions: Yes RUE Weight Bearing: Non weight bearing    Mobility  Bed Mobility               General bed mobility comments: Pt recieved at recliner    Transfers Overall transfer level: Needs assistance Equipment used: Hemi-walker Transfers: Sit to/from Stand Sit to Stand: Min assist         General transfer comment: Cues for techniques, unable to stand for prolonged periods 2/2 R knee pain  Ambulation/Gait Ambulation/Gait assistance: Min assist Gait Distance (Feet): 5 Feet Assistive device: Hemi-walker Gait Pattern/deviations: Step-to pattern         Stairs              Wheelchair Mobility    Modified Rankin (Stroke Patients Only)       Balance Overall balance assessment: Needs assistance Sitting-balance support: Feet supported;Single extremity supported Sitting balance-Leahy Scale: Fair     Standing balance support: Single extremity supported;During functional activity Standing balance-Leahy Scale: Fair Standing balance comment: Requires min-gaurd for safety                            Cognition                                              Exercises Other Exercises Other Exercises: Sit > stand x 3 w/ hemi-walker, min-A with multimodal cues for technique    General Comments        Pertinent Vitals/Pain Pain Assessment: 0-10 Pain Score: 8  Pain Location: R knee Pain Descriptors / Indicators: Aching;Discomfort Pain Intervention(s): Limited activity within patient's tolerance;Monitored during session;Repositioned    Home Living                      Prior Function            PT Goals (current goals can now be found in the care plan section) Acute Rehab PT Goals Patient Stated Goal: to go to rehab and get better PT Goal Formulation: With patient Time For Goal Achievement: 12/22/20 Potential to  Achieve Goals: Good Progress towards PT goals: Progressing toward goals    Frequency    7X/week      PT Plan Current plan remains appropriate    Co-evaluation              AM-PAC PT "6 Clicks" Mobility   Outcome Measure  Help needed turning from your back to your side while in a flat bed without using bedrails?: A Lot Help needed moving from lying on your back to sitting on the side of a flat bed without using bedrails?: A Little Help needed moving to and from a bed to a chair (including a wheelchair)?: A Lot Help needed standing up from a chair using your arms (e.g., wheelchair or bedside chair)?: A Little Help needed to walk in hospital room?: A Lot Help needed climbing 3-5  steps with a railing? : A Lot 6 Click Score: 14    End of Session Equipment Utilized During Treatment: Gait belt Activity Tolerance: Patient tolerated treatment well;Patient limited by pain Patient left: in chair;with call bell/phone within reach;with chair alarm set;with family/visitor present   PT Visit Diagnosis: Repeated falls (R29.6);Muscle weakness (generalized) (M62.81)     Time: 7017-7939 PT Time Calculation (min) (ACUTE ONLY): 44 min  Charges:                       [employer], SPT

## 2020-12-11 DIAGNOSIS — I480 Paroxysmal atrial fibrillation: Secondary | ICD-10-CM | POA: Diagnosis not present

## 2020-12-11 DIAGNOSIS — I10 Essential (primary) hypertension: Secondary | ICD-10-CM | POA: Diagnosis not present

## 2020-12-11 DIAGNOSIS — Z9181 History of falling: Secondary | ICD-10-CM | POA: Diagnosis not present

## 2020-12-11 DIAGNOSIS — R531 Weakness: Secondary | ICD-10-CM | POA: Diagnosis not present

## 2020-12-11 DIAGNOSIS — J439 Emphysema, unspecified: Secondary | ICD-10-CM | POA: Diagnosis not present

## 2020-12-11 DIAGNOSIS — M899 Disorder of bone, unspecified: Secondary | ICD-10-CM | POA: Diagnosis not present

## 2020-12-11 DIAGNOSIS — Z4789 Encounter for other orthopedic aftercare: Secondary | ICD-10-CM | POA: Diagnosis not present

## 2020-12-11 DIAGNOSIS — R2681 Unsteadiness on feet: Secondary | ICD-10-CM | POA: Diagnosis not present

## 2020-12-11 DIAGNOSIS — Z7409 Other reduced mobility: Secondary | ICD-10-CM | POA: Diagnosis not present

## 2020-12-11 DIAGNOSIS — Z8673 Personal history of transient ischemic attack (TIA), and cerebral infarction without residual deficits: Secondary | ICD-10-CM | POA: Diagnosis not present

## 2020-12-11 DIAGNOSIS — E1142 Type 2 diabetes mellitus with diabetic polyneuropathy: Secondary | ICD-10-CM | POA: Diagnosis not present

## 2020-12-11 DIAGNOSIS — R279 Unspecified lack of coordination: Secondary | ICD-10-CM | POA: Diagnosis not present

## 2020-12-11 DIAGNOSIS — Z4731 Aftercare following explantation of shoulder joint prosthesis: Secondary | ICD-10-CM | POA: Diagnosis not present

## 2020-12-11 DIAGNOSIS — B372 Candidiasis of skin and nail: Secondary | ICD-10-CM | POA: Diagnosis not present

## 2020-12-11 DIAGNOSIS — M25512 Pain in left shoulder: Secondary | ICD-10-CM | POA: Diagnosis not present

## 2020-12-11 DIAGNOSIS — R300 Dysuria: Secondary | ICD-10-CM | POA: Diagnosis not present

## 2020-12-11 DIAGNOSIS — M67813 Other specified disorders of tendon, right shoulder: Secondary | ICD-10-CM | POA: Diagnosis not present

## 2020-12-11 DIAGNOSIS — M109 Gout, unspecified: Secondary | ICD-10-CM | POA: Diagnosis not present

## 2020-12-11 DIAGNOSIS — Z96611 Presence of right artificial shoulder joint: Secondary | ICD-10-CM | POA: Diagnosis not present

## 2020-12-11 DIAGNOSIS — Z471 Aftercare following joint replacement surgery: Secondary | ICD-10-CM | POA: Diagnosis not present

## 2020-12-11 DIAGNOSIS — M6281 Muscle weakness (generalized): Secondary | ICD-10-CM | POA: Diagnosis not present

## 2020-12-11 DIAGNOSIS — E782 Mixed hyperlipidemia: Secondary | ICD-10-CM | POA: Diagnosis not present

## 2020-12-11 DIAGNOSIS — F411 Generalized anxiety disorder: Secondary | ICD-10-CM | POA: Diagnosis not present

## 2020-12-11 DIAGNOSIS — M1990 Unspecified osteoarthritis, unspecified site: Secondary | ICD-10-CM | POA: Diagnosis not present

## 2020-12-11 DIAGNOSIS — R5381 Other malaise: Secondary | ICD-10-CM | POA: Diagnosis not present

## 2020-12-11 DIAGNOSIS — N390 Urinary tract infection, site not specified: Secondary | ICD-10-CM | POA: Diagnosis not present

## 2020-12-11 DIAGNOSIS — F430 Acute stress reaction: Secondary | ICD-10-CM | POA: Diagnosis not present

## 2020-12-11 DIAGNOSIS — G4730 Sleep apnea, unspecified: Secondary | ICD-10-CM | POA: Diagnosis not present

## 2020-12-11 DIAGNOSIS — D518 Other vitamin B12 deficiency anemias: Secondary | ICD-10-CM | POA: Diagnosis not present

## 2020-12-11 DIAGNOSIS — R1111 Vomiting without nausea: Secondary | ICD-10-CM | POA: Diagnosis not present

## 2020-12-11 DIAGNOSIS — E1122 Type 2 diabetes mellitus with diabetic chronic kidney disease: Secondary | ICD-10-CM | POA: Diagnosis not present

## 2020-12-11 DIAGNOSIS — I4891 Unspecified atrial fibrillation: Secondary | ICD-10-CM | POA: Diagnosis not present

## 2020-12-11 DIAGNOSIS — G4733 Obstructive sleep apnea (adult) (pediatric): Secondary | ICD-10-CM | POA: Diagnosis not present

## 2020-12-11 LAB — RESP PANEL BY RT-PCR (FLU A&B, COVID) ARPGX2
Influenza A by PCR: NEGATIVE
Influenza B by PCR: NEGATIVE
SARS Coronavirus 2 by RT PCR: NEGATIVE

## 2020-12-11 LAB — GLUCOSE, CAPILLARY
Glucose-Capillary: 110 mg/dL — ABNORMAL HIGH (ref 70–99)
Glucose-Capillary: 181 mg/dL — ABNORMAL HIGH (ref 70–99)

## 2020-12-11 MED ORDER — COVID-19 MRNA VACC (MODERNA) 50 MCG/0.25ML IM SUSP: 0.2500 mL | Freq: Once | INTRAMUSCULAR | Status: DC

## 2020-12-11 NOTE — Plan of Care (Signed)
  Problem: Education: Goal: Understanding of activity limitations/precautions following surgery will improve 12/11/2020 1402 by Quintella Reichert, LPN Outcome: Adequate for Discharge 12/11/2020 1400 by Quintella Reichert, LPN Outcome: Adequate for Discharge   Problem: Activity: Goal: Ability to tolerate increased activity will improve 12/11/2020 1402 by Quintella Reichert, LPN Outcome: Adequate for Discharge 12/11/2020 1400 by Quintella Reichert, LPN Outcome: Adequate for Discharge   Problem: Education: Goal: Knowledge of General Education information will improve Description: Including pain rating scale, medication(s)/side effects and non-pharmacologic comfort measures 12/11/2020 1402 by Quintella Reichert, LPN Outcome: Adequate for Discharge 12/11/2020 1400 by Quintella Reichert, LPN Outcome: Adequate for Discharge   Problem: Health Behavior/Discharge Planning: Goal: Ability to manage health-related needs will improve 12/11/2020 1402 by Quintella Reichert, LPN Outcome: Adequate for Discharge 12/11/2020 1400 by Quintella Reichert, LPN Outcome: Adequate for Discharge   Problem: Clinical Measurements: Goal: Ability to maintain clinical measurements within normal limits will improve 12/11/2020 1402 by Quintella Reichert, LPN Outcome: Adequate for Discharge 12/11/2020 1400 by Quintella Reichert, LPN Outcome: Adequate for Discharge Goal: Will remain free from infection 12/11/2020 1402 by Quintella Reichert, LPN Outcome: Adequate for Discharge 12/11/2020 1400 by Quintella Reichert, LPN Outcome: Adequate for Discharge Goal: Respiratory complications will improve 12/11/2020 1402 by Quintella Reichert, LPN Outcome: Adequate for Discharge 12/11/2020 1400 by Quintella Reichert, LPN Outcome: Adequate for Discharge   Problem: Activity: Goal: Risk for activity intolerance will decrease 12/11/2020 1402 by Quintella Reichert, LPN Outcome: Adequate for Discharge 12/11/2020 1400 by Quintella Reichert, LPN Outcome: Adequate for Discharge   Problem: Nutrition: Goal: Adequate nutrition will  be maintained 12/11/2020 1402 by Quintella Reichert, LPN Outcome: Adequate for Discharge 12/11/2020 1400 by Quintella Reichert, LPN Outcome: Adequate for Discharge   Problem: Coping: Goal: Level of anxiety will decrease 12/11/2020 1402 by Quintella Reichert, LPN Outcome: Adequate for Discharge 12/11/2020 1400 by Quintella Reichert, LPN Outcome: Adequate for Discharge   Problem: Elimination: Goal: Will not experience complications related to urinary retention 12/11/2020 1402 by Quintella Reichert, LPN Outcome: Adequate for Discharge 12/11/2020 1400 by Quintella Reichert, LPN Outcome: Adequate for Discharge   Problem: Pain Managment: Goal: General experience of comfort will improve 12/11/2020 1402 by Quintella Reichert, LPN Outcome: Adequate for Discharge 12/11/2020 1400 by Quintella Reichert, LPN Outcome: Adequate for Discharge   Problem: Safety: Goal: Ability to remain free from injury will improve 12/11/2020 1402 by Quintella Reichert, LPN Outcome: Adequate for Discharge 12/11/2020 1400 by Quintella Reichert, LPN Outcome: Adequate for Discharge   Problem: Skin Integrity: Goal: Risk for impaired skin integrity will decrease 12/11/2020 1402 by Quintella Reichert, LPN Outcome: Adequate for Discharge 12/11/2020 1400 by Quintella Reichert, LPN Outcome: Adequate for Discharge

## 2020-12-11 NOTE — Plan of Care (Signed)
Problem: Education: Goal: Understanding of activity limitations/precautions following surgery will improve 12/11/2020 1404 by Quintella Reichert, LPN Outcome: Completed/Met 12/11/2020 1403 by Quintella Reichert, LPN Outcome: Adequate for Discharge 12/11/2020 1402 by Quintella Reichert, LPN Outcome: Adequate for Discharge 12/11/2020 1400 by Quintella Reichert, LPN Outcome: Adequate for Discharge   Problem: Activity: Goal: Ability to tolerate increased activity will improve 12/11/2020 1404 by Quintella Reichert, LPN Outcome: Completed/Met 12/11/2020 1403 by Quintella Reichert, LPN Outcome: Adequate for Discharge 12/11/2020 1402 by Quintella Reichert, LPN Outcome: Adequate for Discharge 12/11/2020 1400 by Quintella Reichert, LPN Outcome: Adequate for Discharge   Problem: Education: Goal: Knowledge of General Education information will improve Description: Including pain rating scale, medication(s)/side effects and non-pharmacologic comfort measures 12/11/2020 1404 by Quintella Reichert, LPN Outcome: Completed/Met 12/11/2020 1403 by Quintella Reichert, LPN Outcome: Adequate for Discharge 12/11/2020 1402 by Quintella Reichert, LPN Outcome: Adequate for Discharge 12/11/2020 1400 by Quintella Reichert, LPN Outcome: Adequate for Discharge   Problem: Health Behavior/Discharge Planning: Goal: Ability to manage health-related needs will improve 12/11/2020 1404 by Quintella Reichert, LPN Outcome: Completed/Met 12/11/2020 1403 by Quintella Reichert, LPN Outcome: Adequate for Discharge 12/11/2020 1402 by Quintella Reichert, LPN Outcome: Adequate for Discharge 12/11/2020 1400 by Quintella Reichert, LPN Outcome: Adequate for Discharge   Problem: Clinical Measurements: Goal: Ability to maintain clinical measurements within normal limits will improve 12/11/2020 1404 by Quintella Reichert, LPN Outcome: Completed/Met 12/11/2020 1403 by Quintella Reichert, LPN Outcome: Adequate for Discharge 12/11/2020 1402 by Quintella Reichert, LPN Outcome: Adequate for Discharge 12/11/2020 1400 by Quintella Reichert, LPN Outcome:  Adequate for Discharge Goal: Will remain free from infection 12/11/2020 1404 by Quintella Reichert, LPN Outcome: Completed/Met 12/11/2020 1403 by Quintella Reichert, LPN Outcome: Adequate for Discharge 12/11/2020 1402 by Quintella Reichert, LPN Outcome: Adequate for Discharge 12/11/2020 1400 by Quintella Reichert, LPN Outcome: Adequate for Discharge Goal: Respiratory complications will improve 12/11/2020 1404 by Quintella Reichert, LPN Outcome: Completed/Met 12/11/2020 1403 by Quintella Reichert, LPN Outcome: Adequate for Discharge 12/11/2020 1402 by Quintella Reichert, LPN Outcome: Adequate for Discharge 12/11/2020 1400 by Quintella Reichert, LPN Outcome: Adequate for Discharge   Problem: Activity: Goal: Risk for activity intolerance will decrease 12/11/2020 1404 by Quintella Reichert, LPN Outcome: Completed/Met 12/11/2020 1403 by Quintella Reichert, LPN Outcome: Adequate for Discharge 12/11/2020 1402 by Quintella Reichert, LPN Outcome: Adequate for Discharge 12/11/2020 1400 by Quintella Reichert, LPN Outcome: Adequate for Discharge   Problem: Nutrition: Goal: Adequate nutrition will be maintained 12/11/2020 1404 by Quintella Reichert, LPN Outcome: Completed/Met 12/11/2020 1403 by Quintella Reichert, LPN Outcome: Adequate for Discharge 12/11/2020 1402 by Quintella Reichert, LPN Outcome: Adequate for Discharge 12/11/2020 1400 by Quintella Reichert, LPN Outcome: Adequate for Discharge   Problem: Coping: Goal: Level of anxiety will decrease 12/11/2020 1404 by Quintella Reichert, LPN Outcome: Completed/Met 12/11/2020 1403 by Quintella Reichert, LPN Outcome: Adequate for Discharge 12/11/2020 1402 by Quintella Reichert, LPN Outcome: Adequate for Discharge 12/11/2020 1400 by Quintella Reichert, LPN Outcome: Adequate for Discharge   Problem: Elimination: Goal: Will not experience complications related to urinary retention 12/11/2020 1404 by Quintella Reichert, LPN Outcome: Completed/Met 12/11/2020 1403 by Quintella Reichert, LPN Outcome: Adequate for Discharge 12/11/2020 1402 by Quintella Reichert, LPN Outcome: Adequate  for Discharge 12/11/2020 1400 by Quintella Reichert, LPN Outcome: Adequate for Discharge   Problem: Pain Managment:  Goal: General experience of comfort will improve 12/11/2020 1404 by Quintella Reichert, LPN Outcome: Completed/Met 12/11/2020 1403 by Quintella Reichert, LPN Outcome: Adequate for Discharge 12/11/2020 1402 by Quintella Reichert, LPN Outcome: Adequate for Discharge 12/11/2020 1400 by Quintella Reichert, LPN Outcome: Adequate for Discharge   Problem: Safety: Goal: Ability to remain free from injury will improve 12/11/2020 1404 by Quintella Reichert, LPN Outcome: Completed/Met 12/11/2020 1403 by Quintella Reichert, LPN Outcome: Adequate for Discharge 12/11/2020 1402 by Quintella Reichert, LPN Outcome: Adequate for Discharge 12/11/2020 1400 by Quintella Reichert, LPN Outcome: Adequate for Discharge   Problem: Skin Integrity: Goal: Risk for impaired skin integrity will decrease 12/11/2020 1404 by Quintella Reichert, LPN Outcome: Completed/Met 12/11/2020 1403 by Quintella Reichert, LPN Outcome: Adequate for Discharge 12/11/2020 1402 by Quintella Reichert, LPN Outcome: Adequate for Discharge 12/11/2020 1400 by Quintella Reichert, LPN Outcome: Adequate for Discharge

## 2020-12-11 NOTE — Progress Notes (Signed)
Patient resting comfortably. No complaints.   RUE: +ain/pin/u/deltoid motor SILT r/u/m/ax +rad pulse Sling & polar care in place  Plan for DC today to SNF. PT per rehab protocol outlined in DC instructions. F/U with me ~2 weeks postop. Appt already made. ASA 325mg /day x 6 weeks for DVT ppx

## 2020-12-11 NOTE — Progress Notes (Addendum)
Patient A & O and able to make needs known. Will discharge to room 602 at [facility], SNF, today at 3:30 via EMS stretcher. Called facility, [phone], to give report. Spoke with receptionist, Abigail Butts, who took my name and number and stated receiving nurse would call me back after lunch.  1400 spoke with [place], Jeanell Sparrow and report given.

## 2020-12-11 NOTE — Plan of Care (Signed)
Problem: Education: Goal: Understanding of activity limitations/precautions following surgery will improve 12/11/2020 1403 by Quintella Reichert, LPN Outcome: Adequate for Discharge 12/11/2020 1402 by Quintella Reichert, LPN Outcome: Adequate for Discharge 12/11/2020 1400 by Quintella Reichert, LPN Outcome: Adequate for Discharge   Problem: Activity: Goal: Ability to tolerate increased activity will improve 12/11/2020 1403 by Quintella Reichert, LPN Outcome: Adequate for Discharge 12/11/2020 1402 by Quintella Reichert, LPN Outcome: Adequate for Discharge 12/11/2020 1400 by Quintella Reichert, LPN Outcome: Adequate for Discharge   Problem: Education: Goal: Knowledge of General Education information will improve Description: Including pain rating scale, medication(s)/side effects and non-pharmacologic comfort measures 12/11/2020 1403 by Quintella Reichert, LPN Outcome: Adequate for Discharge 12/11/2020 1402 by Quintella Reichert, LPN Outcome: Adequate for Discharge 12/11/2020 1400 by Quintella Reichert, LPN Outcome: Adequate for Discharge   Problem: Health Behavior/Discharge Planning: Goal: Ability to manage health-related needs will improve 12/11/2020 1403 by Quintella Reichert, LPN Outcome: Adequate for Discharge 12/11/2020 1402 by Quintella Reichert, LPN Outcome: Adequate for Discharge 12/11/2020 1400 by Quintella Reichert, LPN Outcome: Adequate for Discharge   Problem: Clinical Measurements: Goal: Ability to maintain clinical measurements within normal limits will improve 12/11/2020 1403 by Quintella Reichert, LPN Outcome: Adequate for Discharge 12/11/2020 1402 by Quintella Reichert, LPN Outcome: Adequate for Discharge 12/11/2020 1400 by Quintella Reichert, LPN Outcome: Adequate for Discharge Goal: Will remain free from infection 12/11/2020 1403 by Quintella Reichert, LPN Outcome: Adequate for Discharge 12/11/2020 1402 by Quintella Reichert, LPN Outcome: Adequate for Discharge 12/11/2020 1400 by Quintella Reichert, LPN Outcome: Adequate for Discharge Goal: Respiratory complications  will improve 12/11/2020 1403 by Quintella Reichert, LPN Outcome: Adequate for Discharge 12/11/2020 1402 by Quintella Reichert, LPN Outcome: Adequate for Discharge 12/11/2020 1400 by Quintella Reichert, LPN Outcome: Adequate for Discharge   Problem: Activity: Goal: Risk for activity intolerance will decrease 12/11/2020 1403 by Quintella Reichert, LPN Outcome: Adequate for Discharge 12/11/2020 1402 by Quintella Reichert, LPN Outcome: Adequate for Discharge 12/11/2020 1400 by Quintella Reichert, LPN Outcome: Adequate for Discharge   Problem: Nutrition: Goal: Adequate nutrition will be maintained 12/11/2020 1403 by Quintella Reichert, LPN Outcome: Adequate for Discharge 12/11/2020 1402 by Quintella Reichert, LPN Outcome: Adequate for Discharge 12/11/2020 1400 by Quintella Reichert, LPN Outcome: Adequate for Discharge   Problem: Coping: Goal: Level of anxiety will decrease 12/11/2020 1403 by Quintella Reichert, LPN Outcome: Adequate for Discharge 12/11/2020 1402 by Quintella Reichert, LPN Outcome: Adequate for Discharge 12/11/2020 1400 by Quintella Reichert, LPN Outcome: Adequate for Discharge   Problem: Elimination: Goal: Will not experience complications related to urinary retention 12/11/2020 1403 by Quintella Reichert, LPN Outcome: Adequate for Discharge 12/11/2020 1402 by Quintella Reichert, LPN Outcome: Adequate for Discharge 12/11/2020 1400 by Quintella Reichert, LPN Outcome: Adequate for Discharge   Problem: Pain Managment: Goal: General experience of comfort will improve 12/11/2020 1403 by Quintella Reichert, LPN Outcome: Adequate for Discharge 12/11/2020 1402 by Quintella Reichert, LPN Outcome: Adequate for Discharge 12/11/2020 1400 by Quintella Reichert, LPN Outcome: Adequate for Discharge   Problem: Safety: Goal: Ability to remain free from injury will improve 12/11/2020 1403 by Quintella Reichert, LPN Outcome: Adequate for Discharge 12/11/2020 1402 by Quintella Reichert, LPN Outcome: Adequate for Discharge 12/11/2020 1400 by Quintella Reichert, LPN Outcome: Adequate for Discharge   Problem:  Skin Integrity: Goal: Risk for impaired skin integrity will decrease  12/11/2020 1403 by Quintella Reichert, LPN Outcome: Adequate for Discharge 12/11/2020 1402 by Quintella Reichert, LPN Outcome: Adequate for Discharge 12/11/2020 1400 by Quintella Reichert, LPN Outcome: Adequate for Discharge

## 2020-12-11 NOTE — Care Management Important Message (Signed)
Important Message  Patient Details  Name: [name] MRN: [id number] Date of Birth: [birth year]   Medicare Important Message Given:  Yes  I reviewed Important Message from Medicare with the patient but she was unable to sign the copy as her primary arm was in a sling.  I let her know that was fine and I would just document it.  She is in agreement with her discharge and I wished her a speedy recovery and thanked her for her time.  Juliann Pulse A Orianna Biskup 12/11/2020, 10:04 AM

## 2020-12-11 NOTE — Plan of Care (Signed)
  Problem: Education: Goal: Understanding of activity limitations/precautions following surgery will improve Outcome: Adequate for Discharge   Problem: Activity: Goal: Ability to tolerate increased activity will improve Outcome: Adequate for Discharge   Problem: Education: Goal: Knowledge of General Education information will improve Description: Including pain rating scale, medication(s)/side effects and non-pharmacologic comfort measures Outcome: Adequate for Discharge   Problem: Health Behavior/Discharge Planning: Goal: Ability to manage health-related needs will improve Outcome: Adequate for Discharge   Problem: Clinical Measurements: Goal: Ability to maintain clinical measurements within normal limits will improve Outcome: Adequate for Discharge Goal: Will remain free from infection Outcome: Adequate for Discharge Goal: Respiratory complications will improve Outcome: Adequate for Discharge   Problem: Activity: Goal: Risk for activity intolerance will decrease Outcome: Adequate for Discharge   Problem: Nutrition: Goal: Adequate nutrition will be maintained Outcome: Adequate for Discharge   Problem: Coping: Goal: Level of anxiety will decrease Outcome: Adequate for Discharge   Problem: Elimination: Goal: Will not experience complications related to urinary retention Outcome: Adequate for Discharge   Problem: Pain Managment: Goal: General experience of comfort will improve Outcome: Adequate for Discharge   Problem: Safety: Goal: Ability to remain free from injury will improve Outcome: Adequate for Discharge   Problem: Skin Integrity: Goal: Risk for impaired skin integrity will decrease Outcome: Adequate for Discharge   Problem: Education: Goal: Understanding of activity limitations/precautions following surgery will improve Outcome: Adequate for Discharge   Problem: Activity: Goal: Ability to tolerate increased activity will improve Outcome: Adequate for  Discharge   Problem: Education: Goal: Knowledge of General Education information will improve Description: Including pain rating scale, medication(s)/side effects and non-pharmacologic comfort measures Outcome: Adequate for Discharge   Problem: Health Behavior/Discharge Planning: Goal: Ability to manage health-related needs will improve Outcome: Adequate for Discharge   Problem: Clinical Measurements: Goal: Ability to maintain clinical measurements within normal limits will improve Outcome: Adequate for Discharge Goal: Will remain free from infection Outcome: Adequate for Discharge Goal: Respiratory complications will improve Outcome: Adequate for Discharge   Problem: Activity: Goal: Risk for activity intolerance will decrease Outcome: Adequate for Discharge   Problem: Nutrition: Goal: Adequate nutrition will be maintained Outcome: Adequate for Discharge   Problem: Coping: Goal: Level of anxiety will decrease Outcome: Adequate for Discharge   Problem: Elimination: Goal: Will not experience complications related to urinary retention Outcome: Adequate for Discharge   Problem: Pain Managment: Goal: General experience of comfort will improve Outcome: Adequate for Discharge   Problem: Safety: Goal: Ability to remain free from injury will improve Outcome: Adequate for Discharge   Problem: Skin Integrity: Goal: Risk for impaired skin integrity will decrease Outcome: Adequate for Discharge

## 2020-12-11 NOTE — TOC Progression Note (Addendum)
Transition of Care [facility]) - Progression Note    Patient Details  Name: Garima Chronis MRN: [id number] Date of Birth: 05-04-1946  Transition of Care [facility]) CM/SW Contact  Su Hilt, RN Phone Number: 12/11/2020, 8:39 AM  Clinical Narrative:     Received notification that the patient has insurance approval to go to [employer] today.   DC summary sent thru the Hub, notified [employer], I notified the patient of the approval and she called her family, She requested to be transported by EMS after lunch today,she stated she does not feel safe riding in a car       Expected Discharge Plan and Services           Expected Discharge Date: 12/11/20                                     Social Determinants of Health (SDOH) Interventions    Readmission Risk Interventions No flowsheet data found.

## 2020-12-15 DIAGNOSIS — I10 Essential (primary) hypertension: Secondary | ICD-10-CM | POA: Diagnosis not present

## 2020-12-15 DIAGNOSIS — M109 Gout, unspecified: Secondary | ICD-10-CM | POA: Diagnosis not present

## 2020-12-15 DIAGNOSIS — Z7409 Other reduced mobility: Secondary | ICD-10-CM | POA: Diagnosis not present

## 2020-12-15 DIAGNOSIS — M25512 Pain in left shoulder: Secondary | ICD-10-CM | POA: Diagnosis not present

## 2020-12-15 DIAGNOSIS — E1142 Type 2 diabetes mellitus with diabetic polyneuropathy: Secondary | ICD-10-CM | POA: Diagnosis not present

## 2020-12-15 DIAGNOSIS — D518 Other vitamin B12 deficiency anemias: Secondary | ICD-10-CM | POA: Diagnosis not present

## 2020-12-15 DIAGNOSIS — G4733 Obstructive sleep apnea (adult) (pediatric): Secondary | ICD-10-CM | POA: Diagnosis not present

## 2020-12-15 DIAGNOSIS — R1111 Vomiting without nausea: Secondary | ICD-10-CM | POA: Diagnosis not present

## 2020-12-15 DIAGNOSIS — M6281 Muscle weakness (generalized): Secondary | ICD-10-CM | POA: Diagnosis not present

## 2020-12-16 DIAGNOSIS — G4730 Sleep apnea, unspecified: Secondary | ICD-10-CM | POA: Diagnosis not present

## 2020-12-16 DIAGNOSIS — I4891 Unspecified atrial fibrillation: Secondary | ICD-10-CM | POA: Diagnosis not present

## 2020-12-16 DIAGNOSIS — B372 Candidiasis of skin and nail: Secondary | ICD-10-CM | POA: Diagnosis not present

## 2020-12-16 DIAGNOSIS — I10 Essential (primary) hypertension: Secondary | ICD-10-CM | POA: Diagnosis not present

## 2020-12-16 DIAGNOSIS — M1990 Unspecified osteoarthritis, unspecified site: Secondary | ICD-10-CM | POA: Diagnosis not present

## 2020-12-16 DIAGNOSIS — M899 Disorder of bone, unspecified: Secondary | ICD-10-CM | POA: Diagnosis not present

## 2020-12-16 DIAGNOSIS — Z96611 Presence of right artificial shoulder joint: Secondary | ICD-10-CM | POA: Diagnosis not present

## 2020-12-16 DIAGNOSIS — R531 Weakness: Secondary | ICD-10-CM | POA: Diagnosis not present

## 2020-12-16 DIAGNOSIS — R2681 Unsteadiness on feet: Secondary | ICD-10-CM | POA: Diagnosis not present

## 2020-12-16 DIAGNOSIS — Z8673 Personal history of transient ischemic attack (TIA), and cerebral infarction without residual deficits: Secondary | ICD-10-CM | POA: Diagnosis not present

## 2020-12-17 DIAGNOSIS — E1142 Type 2 diabetes mellitus with diabetic polyneuropathy: Secondary | ICD-10-CM | POA: Diagnosis not present

## 2020-12-17 DIAGNOSIS — B372 Candidiasis of skin and nail: Secondary | ICD-10-CM | POA: Diagnosis not present

## 2020-12-17 DIAGNOSIS — G4733 Obstructive sleep apnea (adult) (pediatric): Secondary | ICD-10-CM | POA: Diagnosis not present

## 2020-12-17 DIAGNOSIS — M25512 Pain in left shoulder: Secondary | ICD-10-CM | POA: Diagnosis not present

## 2020-12-17 DIAGNOSIS — M6281 Muscle weakness (generalized): Secondary | ICD-10-CM | POA: Diagnosis not present

## 2020-12-17 DIAGNOSIS — Z7409 Other reduced mobility: Secondary | ICD-10-CM | POA: Diagnosis not present

## 2020-12-17 DIAGNOSIS — R1111 Vomiting without nausea: Secondary | ICD-10-CM | POA: Diagnosis not present

## 2020-12-17 DIAGNOSIS — D518 Other vitamin B12 deficiency anemias: Secondary | ICD-10-CM | POA: Diagnosis not present

## 2020-12-17 DIAGNOSIS — I10 Essential (primary) hypertension: Secondary | ICD-10-CM | POA: Diagnosis not present

## 2020-12-19 DIAGNOSIS — N390 Urinary tract infection, site not specified: Secondary | ICD-10-CM | POA: Diagnosis not present

## 2020-12-19 DIAGNOSIS — I10 Essential (primary) hypertension: Secondary | ICD-10-CM | POA: Diagnosis not present

## 2020-12-19 DIAGNOSIS — M6281 Muscle weakness (generalized): Secondary | ICD-10-CM | POA: Diagnosis not present

## 2020-12-19 DIAGNOSIS — E1122 Type 2 diabetes mellitus with diabetic chronic kidney disease: Secondary | ICD-10-CM | POA: Diagnosis not present

## 2020-12-19 DIAGNOSIS — I480 Paroxysmal atrial fibrillation: Secondary | ICD-10-CM | POA: Diagnosis not present

## 2020-12-19 DIAGNOSIS — E782 Mixed hyperlipidemia: Secondary | ICD-10-CM | POA: Diagnosis not present

## 2020-12-19 DIAGNOSIS — Z471 Aftercare following joint replacement surgery: Secondary | ICD-10-CM | POA: Diagnosis not present

## 2020-12-19 DIAGNOSIS — E1142 Type 2 diabetes mellitus with diabetic polyneuropathy: Secondary | ICD-10-CM | POA: Diagnosis not present

## 2020-12-19 DIAGNOSIS — R300 Dysuria: Secondary | ICD-10-CM | POA: Diagnosis not present

## 2020-12-19 DIAGNOSIS — B372 Candidiasis of skin and nail: Secondary | ICD-10-CM | POA: Diagnosis not present

## 2020-12-22 DIAGNOSIS — F430 Acute stress reaction: Secondary | ICD-10-CM | POA: Diagnosis not present

## 2020-12-22 DIAGNOSIS — F411 Generalized anxiety disorder: Secondary | ICD-10-CM | POA: Diagnosis not present

## 2020-12-23 DIAGNOSIS — Z8673 Personal history of transient ischemic attack (TIA), and cerebral infarction without residual deficits: Secondary | ICD-10-CM | POA: Diagnosis not present

## 2020-12-23 DIAGNOSIS — R2681 Unsteadiness on feet: Secondary | ICD-10-CM | POA: Diagnosis not present

## 2020-12-23 DIAGNOSIS — E782 Mixed hyperlipidemia: Secondary | ICD-10-CM | POA: Diagnosis not present

## 2020-12-23 DIAGNOSIS — M6281 Muscle weakness (generalized): Secondary | ICD-10-CM | POA: Diagnosis not present

## 2020-12-23 DIAGNOSIS — I480 Paroxysmal atrial fibrillation: Secondary | ICD-10-CM | POA: Diagnosis not present

## 2020-12-23 DIAGNOSIS — E1142 Type 2 diabetes mellitus with diabetic polyneuropathy: Secondary | ICD-10-CM | POA: Diagnosis not present

## 2020-12-23 DIAGNOSIS — Z96611 Presence of right artificial shoulder joint: Secondary | ICD-10-CM | POA: Diagnosis not present

## 2020-12-23 DIAGNOSIS — R531 Weakness: Secondary | ICD-10-CM | POA: Diagnosis not present

## 2020-12-23 DIAGNOSIS — I4891 Unspecified atrial fibrillation: Secondary | ICD-10-CM | POA: Diagnosis not present

## 2020-12-23 DIAGNOSIS — Z471 Aftercare following joint replacement surgery: Secondary | ICD-10-CM | POA: Diagnosis not present

## 2020-12-23 DIAGNOSIS — M899 Disorder of bone, unspecified: Secondary | ICD-10-CM | POA: Diagnosis not present

## 2020-12-23 DIAGNOSIS — G4730 Sleep apnea, unspecified: Secondary | ICD-10-CM | POA: Diagnosis not present

## 2020-12-23 DIAGNOSIS — M1990 Unspecified osteoarthritis, unspecified site: Secondary | ICD-10-CM | POA: Diagnosis not present

## 2020-12-23 DIAGNOSIS — G4733 Obstructive sleep apnea (adult) (pediatric): Secondary | ICD-10-CM | POA: Diagnosis not present

## 2020-12-23 DIAGNOSIS — B372 Candidiasis of skin and nail: Secondary | ICD-10-CM | POA: Diagnosis not present

## 2020-12-23 DIAGNOSIS — I10 Essential (primary) hypertension: Secondary | ICD-10-CM | POA: Diagnosis not present

## 2020-12-23 DIAGNOSIS — N390 Urinary tract infection, site not specified: Secondary | ICD-10-CM | POA: Diagnosis not present

## 2020-12-24 DIAGNOSIS — Z96611 Presence of right artificial shoulder joint: Secondary | ICD-10-CM | POA: Diagnosis not present

## 2020-12-26 DIAGNOSIS — I480 Paroxysmal atrial fibrillation: Secondary | ICD-10-CM | POA: Diagnosis not present

## 2020-12-26 DIAGNOSIS — E782 Mixed hyperlipidemia: Secondary | ICD-10-CM | POA: Diagnosis not present

## 2020-12-26 DIAGNOSIS — E1122 Type 2 diabetes mellitus with diabetic chronic kidney disease: Secondary | ICD-10-CM | POA: Diagnosis not present

## 2020-12-26 DIAGNOSIS — Z471 Aftercare following joint replacement surgery: Secondary | ICD-10-CM | POA: Diagnosis not present

## 2020-12-26 DIAGNOSIS — M6281 Muscle weakness (generalized): Secondary | ICD-10-CM | POA: Diagnosis not present

## 2020-12-26 DIAGNOSIS — B372 Candidiasis of skin and nail: Secondary | ICD-10-CM | POA: Diagnosis not present

## 2020-12-26 DIAGNOSIS — D518 Other vitamin B12 deficiency anemias: Secondary | ICD-10-CM | POA: Diagnosis not present

## 2020-12-26 DIAGNOSIS — I10 Essential (primary) hypertension: Secondary | ICD-10-CM | POA: Diagnosis not present

## 2020-12-26 DIAGNOSIS — G4733 Obstructive sleep apnea (adult) (pediatric): Secondary | ICD-10-CM | POA: Diagnosis not present

## 2020-12-30 DIAGNOSIS — R2681 Unsteadiness on feet: Secondary | ICD-10-CM | POA: Diagnosis not present

## 2020-12-30 DIAGNOSIS — E782 Mixed hyperlipidemia: Secondary | ICD-10-CM | POA: Diagnosis not present

## 2020-12-30 DIAGNOSIS — Z8673 Personal history of transient ischemic attack (TIA), and cerebral infarction without residual deficits: Secondary | ICD-10-CM | POA: Diagnosis not present

## 2020-12-30 DIAGNOSIS — M6281 Muscle weakness (generalized): Secondary | ICD-10-CM | POA: Diagnosis not present

## 2020-12-30 DIAGNOSIS — G4730 Sleep apnea, unspecified: Secondary | ICD-10-CM | POA: Diagnosis not present

## 2020-12-30 DIAGNOSIS — B372 Candidiasis of skin and nail: Secondary | ICD-10-CM | POA: Diagnosis not present

## 2020-12-30 DIAGNOSIS — R531 Weakness: Secondary | ICD-10-CM | POA: Diagnosis not present

## 2020-12-30 DIAGNOSIS — N390 Urinary tract infection, site not specified: Secondary | ICD-10-CM | POA: Diagnosis not present

## 2020-12-30 DIAGNOSIS — I4891 Unspecified atrial fibrillation: Secondary | ICD-10-CM | POA: Diagnosis not present

## 2020-12-30 DIAGNOSIS — M899 Disorder of bone, unspecified: Secondary | ICD-10-CM | POA: Diagnosis not present

## 2020-12-30 DIAGNOSIS — M1990 Unspecified osteoarthritis, unspecified site: Secondary | ICD-10-CM | POA: Diagnosis not present

## 2020-12-30 DIAGNOSIS — G4733 Obstructive sleep apnea (adult) (pediatric): Secondary | ICD-10-CM | POA: Diagnosis not present

## 2020-12-30 DIAGNOSIS — I10 Essential (primary) hypertension: Secondary | ICD-10-CM | POA: Diagnosis not present

## 2020-12-30 DIAGNOSIS — I480 Paroxysmal atrial fibrillation: Secondary | ICD-10-CM | POA: Diagnosis not present

## 2020-12-30 DIAGNOSIS — Z471 Aftercare following joint replacement surgery: Secondary | ICD-10-CM | POA: Diagnosis not present

## 2020-12-30 DIAGNOSIS — E1142 Type 2 diabetes mellitus with diabetic polyneuropathy: Secondary | ICD-10-CM | POA: Diagnosis not present

## 2020-12-30 DIAGNOSIS — Z96611 Presence of right artificial shoulder joint: Secondary | ICD-10-CM | POA: Diagnosis not present

## 2021-01-05 DIAGNOSIS — F419 Anxiety disorder, unspecified: Secondary | ICD-10-CM | POA: Diagnosis not present

## 2021-01-05 DIAGNOSIS — I69351 Hemiplegia and hemiparesis following cerebral infarction affecting right dominant side: Secondary | ICD-10-CM | POA: Diagnosis not present

## 2021-01-05 DIAGNOSIS — M19011 Primary osteoarthritis, right shoulder: Secondary | ICD-10-CM | POA: Diagnosis not present

## 2021-01-05 DIAGNOSIS — N184 Chronic kidney disease, stage 4 (severe): Secondary | ICD-10-CM | POA: Diagnosis not present

## 2021-01-05 DIAGNOSIS — I10 Essential (primary) hypertension: Secondary | ICD-10-CM | POA: Diagnosis not present

## 2021-01-05 DIAGNOSIS — K219 Gastro-esophageal reflux disease without esophagitis: Secondary | ICD-10-CM | POA: Diagnosis not present

## 2021-01-08 DIAGNOSIS — E1142 Type 2 diabetes mellitus with diabetic polyneuropathy: Secondary | ICD-10-CM | POA: Diagnosis not present

## 2021-01-08 DIAGNOSIS — N189 Chronic kidney disease, unspecified: Secondary | ICD-10-CM | POA: Diagnosis not present

## 2021-01-08 DIAGNOSIS — I129 Hypertensive chronic kidney disease with stage 1 through stage 4 chronic kidney disease, or unspecified chronic kidney disease: Secondary | ICD-10-CM | POA: Diagnosis not present

## 2021-01-08 DIAGNOSIS — M1990 Unspecified osteoarthritis, unspecified site: Secondary | ICD-10-CM | POA: Diagnosis not present

## 2021-01-08 DIAGNOSIS — E538 Deficiency of other specified B group vitamins: Secondary | ICD-10-CM | POA: Diagnosis not present

## 2021-01-08 DIAGNOSIS — Z471 Aftercare following joint replacement surgery: Secondary | ICD-10-CM | POA: Diagnosis not present

## 2021-01-08 DIAGNOSIS — E1122 Type 2 diabetes mellitus with diabetic chronic kidney disease: Secondary | ICD-10-CM | POA: Diagnosis not present

## 2021-01-08 DIAGNOSIS — I480 Paroxysmal atrial fibrillation: Secondary | ICD-10-CM | POA: Diagnosis not present

## 2021-01-08 DIAGNOSIS — F411 Generalized anxiety disorder: Secondary | ICD-10-CM | POA: Diagnosis not present

## 2021-01-09 DIAGNOSIS — M1990 Unspecified osteoarthritis, unspecified site: Secondary | ICD-10-CM | POA: Diagnosis not present

## 2021-01-09 DIAGNOSIS — I129 Hypertensive chronic kidney disease with stage 1 through stage 4 chronic kidney disease, or unspecified chronic kidney disease: Secondary | ICD-10-CM | POA: Diagnosis not present

## 2021-01-09 DIAGNOSIS — F411 Generalized anxiety disorder: Secondary | ICD-10-CM | POA: Diagnosis not present

## 2021-01-09 DIAGNOSIS — E538 Deficiency of other specified B group vitamins: Secondary | ICD-10-CM | POA: Diagnosis not present

## 2021-01-09 DIAGNOSIS — E1142 Type 2 diabetes mellitus with diabetic polyneuropathy: Secondary | ICD-10-CM | POA: Diagnosis not present

## 2021-01-09 DIAGNOSIS — N189 Chronic kidney disease, unspecified: Secondary | ICD-10-CM | POA: Diagnosis not present

## 2021-01-09 DIAGNOSIS — Z471 Aftercare following joint replacement surgery: Secondary | ICD-10-CM | POA: Diagnosis not present

## 2021-01-09 DIAGNOSIS — I480 Paroxysmal atrial fibrillation: Secondary | ICD-10-CM | POA: Diagnosis not present

## 2021-01-09 DIAGNOSIS — E1122 Type 2 diabetes mellitus with diabetic chronic kidney disease: Secondary | ICD-10-CM | POA: Diagnosis not present

## 2021-01-13 DIAGNOSIS — E1142 Type 2 diabetes mellitus with diabetic polyneuropathy: Secondary | ICD-10-CM | POA: Diagnosis not present

## 2021-01-13 DIAGNOSIS — E538 Deficiency of other specified B group vitamins: Secondary | ICD-10-CM | POA: Diagnosis not present

## 2021-01-13 DIAGNOSIS — I480 Paroxysmal atrial fibrillation: Secondary | ICD-10-CM | POA: Diagnosis not present

## 2021-01-13 DIAGNOSIS — M1990 Unspecified osteoarthritis, unspecified site: Secondary | ICD-10-CM | POA: Diagnosis not present

## 2021-01-13 DIAGNOSIS — F411 Generalized anxiety disorder: Secondary | ICD-10-CM | POA: Diagnosis not present

## 2021-01-13 DIAGNOSIS — Z471 Aftercare following joint replacement surgery: Secondary | ICD-10-CM | POA: Diagnosis not present

## 2021-01-13 DIAGNOSIS — I129 Hypertensive chronic kidney disease with stage 1 through stage 4 chronic kidney disease, or unspecified chronic kidney disease: Secondary | ICD-10-CM | POA: Diagnosis not present

## 2021-01-13 DIAGNOSIS — N189 Chronic kidney disease, unspecified: Secondary | ICD-10-CM | POA: Diagnosis not present

## 2021-01-13 DIAGNOSIS — E1122 Type 2 diabetes mellitus with diabetic chronic kidney disease: Secondary | ICD-10-CM | POA: Diagnosis not present

## 2021-01-14 DIAGNOSIS — N189 Chronic kidney disease, unspecified: Secondary | ICD-10-CM | POA: Diagnosis not present

## 2021-01-14 DIAGNOSIS — E1142 Type 2 diabetes mellitus with diabetic polyneuropathy: Secondary | ICD-10-CM | POA: Diagnosis not present

## 2021-01-14 DIAGNOSIS — I129 Hypertensive chronic kidney disease with stage 1 through stage 4 chronic kidney disease, or unspecified chronic kidney disease: Secondary | ICD-10-CM | POA: Diagnosis not present

## 2021-01-14 DIAGNOSIS — M1990 Unspecified osteoarthritis, unspecified site: Secondary | ICD-10-CM | POA: Diagnosis not present

## 2021-01-14 DIAGNOSIS — E1122 Type 2 diabetes mellitus with diabetic chronic kidney disease: Secondary | ICD-10-CM | POA: Diagnosis not present

## 2021-01-14 DIAGNOSIS — F411 Generalized anxiety disorder: Secondary | ICD-10-CM | POA: Diagnosis not present

## 2021-01-14 DIAGNOSIS — E538 Deficiency of other specified B group vitamins: Secondary | ICD-10-CM | POA: Diagnosis not present

## 2021-01-14 DIAGNOSIS — I480 Paroxysmal atrial fibrillation: Secondary | ICD-10-CM | POA: Diagnosis not present

## 2021-01-14 DIAGNOSIS — Z471 Aftercare following joint replacement surgery: Secondary | ICD-10-CM | POA: Diagnosis not present

## 2021-01-15 DIAGNOSIS — F411 Generalized anxiety disorder: Secondary | ICD-10-CM | POA: Diagnosis not present

## 2021-01-15 DIAGNOSIS — E1122 Type 2 diabetes mellitus with diabetic chronic kidney disease: Secondary | ICD-10-CM | POA: Diagnosis not present

## 2021-01-15 DIAGNOSIS — E1142 Type 2 diabetes mellitus with diabetic polyneuropathy: Secondary | ICD-10-CM | POA: Diagnosis not present

## 2021-01-15 DIAGNOSIS — N189 Chronic kidney disease, unspecified: Secondary | ICD-10-CM | POA: Diagnosis not present

## 2021-01-15 DIAGNOSIS — I480 Paroxysmal atrial fibrillation: Secondary | ICD-10-CM | POA: Diagnosis not present

## 2021-01-15 DIAGNOSIS — Z471 Aftercare following joint replacement surgery: Secondary | ICD-10-CM | POA: Diagnosis not present

## 2021-01-15 DIAGNOSIS — E538 Deficiency of other specified B group vitamins: Secondary | ICD-10-CM | POA: Diagnosis not present

## 2021-01-15 DIAGNOSIS — I129 Hypertensive chronic kidney disease with stage 1 through stage 4 chronic kidney disease, or unspecified chronic kidney disease: Secondary | ICD-10-CM | POA: Diagnosis not present

## 2021-01-15 DIAGNOSIS — M1990 Unspecified osteoarthritis, unspecified site: Secondary | ICD-10-CM | POA: Diagnosis not present

## 2021-01-19 DIAGNOSIS — I129 Hypertensive chronic kidney disease with stage 1 through stage 4 chronic kidney disease, or unspecified chronic kidney disease: Secondary | ICD-10-CM | POA: Diagnosis not present

## 2021-01-19 DIAGNOSIS — M1990 Unspecified osteoarthritis, unspecified site: Secondary | ICD-10-CM | POA: Diagnosis not present

## 2021-01-19 DIAGNOSIS — E1122 Type 2 diabetes mellitus with diabetic chronic kidney disease: Secondary | ICD-10-CM | POA: Diagnosis not present

## 2021-01-19 DIAGNOSIS — Z471 Aftercare following joint replacement surgery: Secondary | ICD-10-CM | POA: Diagnosis not present

## 2021-01-19 DIAGNOSIS — E538 Deficiency of other specified B group vitamins: Secondary | ICD-10-CM | POA: Diagnosis not present

## 2021-01-19 DIAGNOSIS — E1142 Type 2 diabetes mellitus with diabetic polyneuropathy: Secondary | ICD-10-CM | POA: Diagnosis not present

## 2021-01-19 DIAGNOSIS — I480 Paroxysmal atrial fibrillation: Secondary | ICD-10-CM | POA: Diagnosis not present

## 2021-01-19 DIAGNOSIS — N189 Chronic kidney disease, unspecified: Secondary | ICD-10-CM | POA: Diagnosis not present

## 2021-01-19 DIAGNOSIS — F411 Generalized anxiety disorder: Secondary | ICD-10-CM | POA: Diagnosis not present

## 2021-01-20 DIAGNOSIS — E1142 Type 2 diabetes mellitus with diabetic polyneuropathy: Secondary | ICD-10-CM | POA: Diagnosis not present

## 2021-01-20 DIAGNOSIS — I480 Paroxysmal atrial fibrillation: Secondary | ICD-10-CM | POA: Diagnosis not present

## 2021-01-20 DIAGNOSIS — N189 Chronic kidney disease, unspecified: Secondary | ICD-10-CM | POA: Diagnosis not present

## 2021-01-20 DIAGNOSIS — Z471 Aftercare following joint replacement surgery: Secondary | ICD-10-CM | POA: Diagnosis not present

## 2021-01-20 DIAGNOSIS — E1122 Type 2 diabetes mellitus with diabetic chronic kidney disease: Secondary | ICD-10-CM | POA: Diagnosis not present

## 2021-01-20 DIAGNOSIS — M1990 Unspecified osteoarthritis, unspecified site: Secondary | ICD-10-CM | POA: Diagnosis not present

## 2021-01-20 DIAGNOSIS — I129 Hypertensive chronic kidney disease with stage 1 through stage 4 chronic kidney disease, or unspecified chronic kidney disease: Secondary | ICD-10-CM | POA: Diagnosis not present

## 2021-01-21 DIAGNOSIS — Z96611 Presence of right artificial shoulder joint: Secondary | ICD-10-CM | POA: Diagnosis not present

## 2021-01-22 DIAGNOSIS — K219 Gastro-esophageal reflux disease without esophagitis: Secondary | ICD-10-CM | POA: Diagnosis not present

## 2021-01-22 DIAGNOSIS — I10 Essential (primary) hypertension: Secondary | ICD-10-CM | POA: Diagnosis not present

## 2021-01-22 DIAGNOSIS — F419 Anxiety disorder, unspecified: Secondary | ICD-10-CM | POA: Diagnosis not present

## 2021-01-22 DIAGNOSIS — I69351 Hemiplegia and hemiparesis following cerebral infarction affecting right dominant side: Secondary | ICD-10-CM | POA: Diagnosis not present

## 2021-01-22 DIAGNOSIS — N184 Chronic kidney disease, stage 4 (severe): Secondary | ICD-10-CM | POA: Diagnosis not present

## 2021-01-23 DIAGNOSIS — M1990 Unspecified osteoarthritis, unspecified site: Secondary | ICD-10-CM | POA: Diagnosis not present

## 2021-01-23 DIAGNOSIS — F411 Generalized anxiety disorder: Secondary | ICD-10-CM | POA: Diagnosis not present

## 2021-01-23 DIAGNOSIS — N189 Chronic kidney disease, unspecified: Secondary | ICD-10-CM | POA: Diagnosis not present

## 2021-01-23 DIAGNOSIS — I129 Hypertensive chronic kidney disease with stage 1 through stage 4 chronic kidney disease, or unspecified chronic kidney disease: Secondary | ICD-10-CM | POA: Diagnosis not present

## 2021-01-23 DIAGNOSIS — E1142 Type 2 diabetes mellitus with diabetic polyneuropathy: Secondary | ICD-10-CM | POA: Diagnosis not present

## 2021-01-23 DIAGNOSIS — E1122 Type 2 diabetes mellitus with diabetic chronic kidney disease: Secondary | ICD-10-CM | POA: Diagnosis not present

## 2021-01-23 DIAGNOSIS — E538 Deficiency of other specified B group vitamins: Secondary | ICD-10-CM | POA: Diagnosis not present

## 2021-01-23 DIAGNOSIS — Z471 Aftercare following joint replacement surgery: Secondary | ICD-10-CM | POA: Diagnosis not present

## 2021-01-23 DIAGNOSIS — I480 Paroxysmal atrial fibrillation: Secondary | ICD-10-CM | POA: Diagnosis not present

## 2021-01-27 DIAGNOSIS — E538 Deficiency of other specified B group vitamins: Secondary | ICD-10-CM | POA: Diagnosis not present

## 2021-01-27 DIAGNOSIS — I480 Paroxysmal atrial fibrillation: Secondary | ICD-10-CM | POA: Diagnosis not present

## 2021-01-27 DIAGNOSIS — E1142 Type 2 diabetes mellitus with diabetic polyneuropathy: Secondary | ICD-10-CM | POA: Diagnosis not present

## 2021-01-27 DIAGNOSIS — F411 Generalized anxiety disorder: Secondary | ICD-10-CM | POA: Diagnosis not present

## 2021-01-27 DIAGNOSIS — Z471 Aftercare following joint replacement surgery: Secondary | ICD-10-CM | POA: Diagnosis not present

## 2021-01-27 DIAGNOSIS — I129 Hypertensive chronic kidney disease with stage 1 through stage 4 chronic kidney disease, or unspecified chronic kidney disease: Secondary | ICD-10-CM | POA: Diagnosis not present

## 2021-01-27 DIAGNOSIS — N189 Chronic kidney disease, unspecified: Secondary | ICD-10-CM | POA: Diagnosis not present

## 2021-01-27 DIAGNOSIS — E1122 Type 2 diabetes mellitus with diabetic chronic kidney disease: Secondary | ICD-10-CM | POA: Diagnosis not present

## 2021-01-27 DIAGNOSIS — M1990 Unspecified osteoarthritis, unspecified site: Secondary | ICD-10-CM | POA: Diagnosis not present

## 2021-01-28 DIAGNOSIS — E1142 Type 2 diabetes mellitus with diabetic polyneuropathy: Secondary | ICD-10-CM | POA: Diagnosis not present

## 2021-01-28 DIAGNOSIS — E538 Deficiency of other specified B group vitamins: Secondary | ICD-10-CM | POA: Diagnosis not present

## 2021-01-28 DIAGNOSIS — E1122 Type 2 diabetes mellitus with diabetic chronic kidney disease: Secondary | ICD-10-CM | POA: Diagnosis not present

## 2021-01-28 DIAGNOSIS — I129 Hypertensive chronic kidney disease with stage 1 through stage 4 chronic kidney disease, or unspecified chronic kidney disease: Secondary | ICD-10-CM | POA: Diagnosis not present

## 2021-01-28 DIAGNOSIS — Z471 Aftercare following joint replacement surgery: Secondary | ICD-10-CM | POA: Diagnosis not present

## 2021-01-28 DIAGNOSIS — N189 Chronic kidney disease, unspecified: Secondary | ICD-10-CM | POA: Diagnosis not present

## 2021-01-28 DIAGNOSIS — I480 Paroxysmal atrial fibrillation: Secondary | ICD-10-CM | POA: Diagnosis not present

## 2021-01-28 DIAGNOSIS — F411 Generalized anxiety disorder: Secondary | ICD-10-CM | POA: Diagnosis not present

## 2021-01-28 DIAGNOSIS — M1990 Unspecified osteoarthritis, unspecified site: Secondary | ICD-10-CM | POA: Diagnosis not present

## 2021-02-03 DIAGNOSIS — E538 Deficiency of other specified B group vitamins: Secondary | ICD-10-CM | POA: Diagnosis not present

## 2021-02-03 DIAGNOSIS — I129 Hypertensive chronic kidney disease with stage 1 through stage 4 chronic kidney disease, or unspecified chronic kidney disease: Secondary | ICD-10-CM | POA: Diagnosis not present

## 2021-02-03 DIAGNOSIS — Z471 Aftercare following joint replacement surgery: Secondary | ICD-10-CM | POA: Diagnosis not present

## 2021-02-03 DIAGNOSIS — N189 Chronic kidney disease, unspecified: Secondary | ICD-10-CM | POA: Diagnosis not present

## 2021-02-03 DIAGNOSIS — M1990 Unspecified osteoarthritis, unspecified site: Secondary | ICD-10-CM | POA: Diagnosis not present

## 2021-02-03 DIAGNOSIS — E1142 Type 2 diabetes mellitus with diabetic polyneuropathy: Secondary | ICD-10-CM | POA: Diagnosis not present

## 2021-02-03 DIAGNOSIS — F411 Generalized anxiety disorder: Secondary | ICD-10-CM | POA: Diagnosis not present

## 2021-02-03 DIAGNOSIS — I480 Paroxysmal atrial fibrillation: Secondary | ICD-10-CM | POA: Diagnosis not present

## 2021-02-03 DIAGNOSIS — E1122 Type 2 diabetes mellitus with diabetic chronic kidney disease: Secondary | ICD-10-CM | POA: Diagnosis not present

## 2021-02-04 DIAGNOSIS — E1151 Type 2 diabetes mellitus with diabetic peripheral angiopathy without gangrene: Secondary | ICD-10-CM | POA: Diagnosis not present

## 2021-02-04 DIAGNOSIS — M109 Gout, unspecified: Secondary | ICD-10-CM | POA: Diagnosis not present

## 2021-02-04 DIAGNOSIS — E782 Mixed hyperlipidemia: Secondary | ICD-10-CM | POA: Diagnosis not present

## 2021-02-06 DIAGNOSIS — I69351 Hemiplegia and hemiparesis following cerebral infarction affecting right dominant side: Secondary | ICD-10-CM | POA: Diagnosis not present

## 2021-02-06 DIAGNOSIS — K219 Gastro-esophageal reflux disease without esophagitis: Secondary | ICD-10-CM | POA: Diagnosis not present

## 2021-02-06 DIAGNOSIS — F419 Anxiety disorder, unspecified: Secondary | ICD-10-CM | POA: Diagnosis not present

## 2021-02-06 DIAGNOSIS — I10 Essential (primary) hypertension: Secondary | ICD-10-CM | POA: Diagnosis not present

## 2021-02-06 DIAGNOSIS — N184 Chronic kidney disease, stage 4 (severe): Secondary | ICD-10-CM | POA: Diagnosis not present

## 2021-02-08 DIAGNOSIS — I129 Hypertensive chronic kidney disease with stage 1 through stage 4 chronic kidney disease, or unspecified chronic kidney disease: Secondary | ICD-10-CM | POA: Diagnosis not present

## 2021-02-08 DIAGNOSIS — M1990 Unspecified osteoarthritis, unspecified site: Secondary | ICD-10-CM | POA: Diagnosis not present

## 2021-02-08 DIAGNOSIS — N189 Chronic kidney disease, unspecified: Secondary | ICD-10-CM | POA: Diagnosis not present

## 2021-02-08 DIAGNOSIS — E1122 Type 2 diabetes mellitus with diabetic chronic kidney disease: Secondary | ICD-10-CM | POA: Diagnosis not present

## 2021-02-08 DIAGNOSIS — Z471 Aftercare following joint replacement surgery: Secondary | ICD-10-CM | POA: Diagnosis not present

## 2021-02-08 DIAGNOSIS — I480 Paroxysmal atrial fibrillation: Secondary | ICD-10-CM | POA: Diagnosis not present

## 2021-02-08 DIAGNOSIS — E538 Deficiency of other specified B group vitamins: Secondary | ICD-10-CM | POA: Diagnosis not present

## 2021-02-08 DIAGNOSIS — F411 Generalized anxiety disorder: Secondary | ICD-10-CM | POA: Diagnosis not present

## 2021-02-08 DIAGNOSIS — E1142 Type 2 diabetes mellitus with diabetic polyneuropathy: Secondary | ICD-10-CM | POA: Diagnosis not present

## 2021-02-09 DIAGNOSIS — N189 Chronic kidney disease, unspecified: Secondary | ICD-10-CM | POA: Diagnosis not present

## 2021-02-09 DIAGNOSIS — I480 Paroxysmal atrial fibrillation: Secondary | ICD-10-CM | POA: Diagnosis not present

## 2021-02-09 DIAGNOSIS — I129 Hypertensive chronic kidney disease with stage 1 through stage 4 chronic kidney disease, or unspecified chronic kidney disease: Secondary | ICD-10-CM | POA: Diagnosis not present

## 2021-02-09 DIAGNOSIS — E1142 Type 2 diabetes mellitus with diabetic polyneuropathy: Secondary | ICD-10-CM | POA: Diagnosis not present

## 2021-02-09 DIAGNOSIS — Z471 Aftercare following joint replacement surgery: Secondary | ICD-10-CM | POA: Diagnosis not present

## 2021-02-09 DIAGNOSIS — E1122 Type 2 diabetes mellitus with diabetic chronic kidney disease: Secondary | ICD-10-CM | POA: Diagnosis not present

## 2021-02-09 DIAGNOSIS — M1990 Unspecified osteoarthritis, unspecified site: Secondary | ICD-10-CM | POA: Diagnosis not present

## 2021-02-09 DIAGNOSIS — E538 Deficiency of other specified B group vitamins: Secondary | ICD-10-CM | POA: Diagnosis not present

## 2021-02-09 DIAGNOSIS — F411 Generalized anxiety disorder: Secondary | ICD-10-CM | POA: Diagnosis not present

## 2021-02-11 DIAGNOSIS — E1151 Type 2 diabetes mellitus with diabetic peripheral angiopathy without gangrene: Secondary | ICD-10-CM | POA: Diagnosis not present

## 2021-02-11 DIAGNOSIS — E785 Hyperlipidemia, unspecified: Secondary | ICD-10-CM | POA: Diagnosis not present

## 2021-02-11 DIAGNOSIS — M1000 Idiopathic gout, unspecified site: Secondary | ICD-10-CM | POA: Diagnosis not present

## 2021-02-11 DIAGNOSIS — E1140 Type 2 diabetes mellitus with diabetic neuropathy, unspecified: Secondary | ICD-10-CM | POA: Diagnosis not present

## 2021-02-11 DIAGNOSIS — Z96611 Presence of right artificial shoulder joint: Secondary | ICD-10-CM | POA: Insufficient documentation

## 2021-02-11 DIAGNOSIS — Z23 Encounter for immunization: Secondary | ICD-10-CM | POA: Diagnosis not present

## 2021-02-12 DIAGNOSIS — Z471 Aftercare following joint replacement surgery: Secondary | ICD-10-CM | POA: Diagnosis not present

## 2021-02-12 DIAGNOSIS — E538 Deficiency of other specified B group vitamins: Secondary | ICD-10-CM | POA: Diagnosis not present

## 2021-02-12 DIAGNOSIS — F411 Generalized anxiety disorder: Secondary | ICD-10-CM | POA: Diagnosis not present

## 2021-02-12 DIAGNOSIS — M1990 Unspecified osteoarthritis, unspecified site: Secondary | ICD-10-CM | POA: Diagnosis not present

## 2021-02-12 DIAGNOSIS — N189 Chronic kidney disease, unspecified: Secondary | ICD-10-CM | POA: Diagnosis not present

## 2021-02-12 DIAGNOSIS — I480 Paroxysmal atrial fibrillation: Secondary | ICD-10-CM | POA: Diagnosis not present

## 2021-02-12 DIAGNOSIS — E1142 Type 2 diabetes mellitus with diabetic polyneuropathy: Secondary | ICD-10-CM | POA: Diagnosis not present

## 2021-02-12 DIAGNOSIS — E1122 Type 2 diabetes mellitus with diabetic chronic kidney disease: Secondary | ICD-10-CM | POA: Diagnosis not present

## 2021-02-12 DIAGNOSIS — I129 Hypertensive chronic kidney disease with stage 1 through stage 4 chronic kidney disease, or unspecified chronic kidney disease: Secondary | ICD-10-CM | POA: Diagnosis not present

## 2021-02-16 ENCOUNTER — Other Ambulatory Visit: Payer: Self-pay

## 2021-02-16 ENCOUNTER — Ambulatory Visit
Admission: RE | Admit: 2021-02-16 | Discharge: 2021-02-16 | Disposition: A | Discharge status: Home / Self Care | Payer: Medicare HMO | Source: Ambulatory Visit | Attending: Internal Medicine | Admitting: Internal Medicine

## 2021-02-16 DIAGNOSIS — N184 Chronic kidney disease, stage 4 (severe): Secondary | ICD-10-CM | POA: Diagnosis not present

## 2021-02-16 DIAGNOSIS — K219 Gastro-esophageal reflux disease without esophagitis: Secondary | ICD-10-CM | POA: Diagnosis not present

## 2021-02-16 DIAGNOSIS — F419 Anxiety disorder, unspecified: Secondary | ICD-10-CM | POA: Diagnosis not present

## 2021-02-16 DIAGNOSIS — Z1231 Encounter for screening mammogram for malignant neoplasm of breast: Secondary | ICD-10-CM

## 2021-02-16 DIAGNOSIS — I10 Essential (primary) hypertension: Secondary | ICD-10-CM | POA: Diagnosis not present

## 2021-02-16 DIAGNOSIS — I69351 Hemiplegia and hemiparesis following cerebral infarction affecting right dominant side: Secondary | ICD-10-CM | POA: Diagnosis not present

## 2021-02-17 DIAGNOSIS — M1990 Unspecified osteoarthritis, unspecified site: Secondary | ICD-10-CM | POA: Diagnosis not present

## 2021-02-17 DIAGNOSIS — E1122 Type 2 diabetes mellitus with diabetic chronic kidney disease: Secondary | ICD-10-CM | POA: Diagnosis not present

## 2021-02-17 DIAGNOSIS — F411 Generalized anxiety disorder: Secondary | ICD-10-CM | POA: Diagnosis not present

## 2021-02-17 DIAGNOSIS — I129 Hypertensive chronic kidney disease with stage 1 through stage 4 chronic kidney disease, or unspecified chronic kidney disease: Secondary | ICD-10-CM | POA: Diagnosis not present

## 2021-02-17 DIAGNOSIS — I480 Paroxysmal atrial fibrillation: Secondary | ICD-10-CM | POA: Diagnosis not present

## 2021-02-17 DIAGNOSIS — Z471 Aftercare following joint replacement surgery: Secondary | ICD-10-CM | POA: Diagnosis not present

## 2021-02-17 DIAGNOSIS — N189 Chronic kidney disease, unspecified: Secondary | ICD-10-CM | POA: Diagnosis not present

## 2021-02-17 DIAGNOSIS — E538 Deficiency of other specified B group vitamins: Secondary | ICD-10-CM | POA: Diagnosis not present

## 2021-02-17 DIAGNOSIS — E1142 Type 2 diabetes mellitus with diabetic polyneuropathy: Secondary | ICD-10-CM | POA: Diagnosis not present

## 2021-02-18 DIAGNOSIS — I129 Hypertensive chronic kidney disease with stage 1 through stage 4 chronic kidney disease, or unspecified chronic kidney disease: Secondary | ICD-10-CM | POA: Diagnosis not present

## 2021-02-18 DIAGNOSIS — M1990 Unspecified osteoarthritis, unspecified site: Secondary | ICD-10-CM | POA: Diagnosis not present

## 2021-02-18 DIAGNOSIS — E1142 Type 2 diabetes mellitus with diabetic polyneuropathy: Secondary | ICD-10-CM | POA: Diagnosis not present

## 2021-02-18 DIAGNOSIS — F411 Generalized anxiety disorder: Secondary | ICD-10-CM | POA: Diagnosis not present

## 2021-02-18 DIAGNOSIS — E1122 Type 2 diabetes mellitus with diabetic chronic kidney disease: Secondary | ICD-10-CM | POA: Diagnosis not present

## 2021-02-18 DIAGNOSIS — I480 Paroxysmal atrial fibrillation: Secondary | ICD-10-CM | POA: Diagnosis not present

## 2021-02-18 DIAGNOSIS — E538 Deficiency of other specified B group vitamins: Secondary | ICD-10-CM | POA: Diagnosis not present

## 2021-02-18 DIAGNOSIS — N189 Chronic kidney disease, unspecified: Secondary | ICD-10-CM | POA: Diagnosis not present

## 2021-02-18 DIAGNOSIS — Z471 Aftercare following joint replacement surgery: Secondary | ICD-10-CM | POA: Diagnosis not present

## 2021-02-23 DIAGNOSIS — E1122 Type 2 diabetes mellitus with diabetic chronic kidney disease: Secondary | ICD-10-CM | POA: Diagnosis not present

## 2021-02-23 DIAGNOSIS — F411 Generalized anxiety disorder: Secondary | ICD-10-CM | POA: Diagnosis not present

## 2021-02-23 DIAGNOSIS — E1142 Type 2 diabetes mellitus with diabetic polyneuropathy: Secondary | ICD-10-CM | POA: Diagnosis not present

## 2021-02-23 DIAGNOSIS — I480 Paroxysmal atrial fibrillation: Secondary | ICD-10-CM | POA: Diagnosis not present

## 2021-02-23 DIAGNOSIS — M1990 Unspecified osteoarthritis, unspecified site: Secondary | ICD-10-CM | POA: Diagnosis not present

## 2021-02-23 DIAGNOSIS — E538 Deficiency of other specified B group vitamins: Secondary | ICD-10-CM | POA: Diagnosis not present

## 2021-02-23 DIAGNOSIS — I129 Hypertensive chronic kidney disease with stage 1 through stage 4 chronic kidney disease, or unspecified chronic kidney disease: Secondary | ICD-10-CM | POA: Diagnosis not present

## 2021-02-23 DIAGNOSIS — N189 Chronic kidney disease, unspecified: Secondary | ICD-10-CM | POA: Diagnosis not present

## 2021-02-23 DIAGNOSIS — Z471 Aftercare following joint replacement surgery: Secondary | ICD-10-CM | POA: Diagnosis not present

## 2021-02-25 DIAGNOSIS — E1122 Type 2 diabetes mellitus with diabetic chronic kidney disease: Secondary | ICD-10-CM | POA: Diagnosis not present

## 2021-02-25 DIAGNOSIS — Z471 Aftercare following joint replacement surgery: Secondary | ICD-10-CM | POA: Diagnosis not present

## 2021-02-25 DIAGNOSIS — N189 Chronic kidney disease, unspecified: Secondary | ICD-10-CM | POA: Diagnosis not present

## 2021-02-25 DIAGNOSIS — I480 Paroxysmal atrial fibrillation: Secondary | ICD-10-CM | POA: Diagnosis not present

## 2021-02-25 DIAGNOSIS — E538 Deficiency of other specified B group vitamins: Secondary | ICD-10-CM | POA: Diagnosis not present

## 2021-02-25 DIAGNOSIS — F411 Generalized anxiety disorder: Secondary | ICD-10-CM | POA: Diagnosis not present

## 2021-02-25 DIAGNOSIS — E1142 Type 2 diabetes mellitus with diabetic polyneuropathy: Secondary | ICD-10-CM | POA: Diagnosis not present

## 2021-02-25 DIAGNOSIS — M1990 Unspecified osteoarthritis, unspecified site: Secondary | ICD-10-CM | POA: Diagnosis not present

## 2021-02-25 DIAGNOSIS — I129 Hypertensive chronic kidney disease with stage 1 through stage 4 chronic kidney disease, or unspecified chronic kidney disease: Secondary | ICD-10-CM | POA: Diagnosis not present

## 2021-02-27 DIAGNOSIS — N184 Chronic kidney disease, stage 4 (severe): Secondary | ICD-10-CM | POA: Diagnosis not present

## 2021-02-27 DIAGNOSIS — K219 Gastro-esophageal reflux disease without esophagitis: Secondary | ICD-10-CM | POA: Diagnosis not present

## 2021-02-27 DIAGNOSIS — I69351 Hemiplegia and hemiparesis following cerebral infarction affecting right dominant side: Secondary | ICD-10-CM | POA: Diagnosis not present

## 2021-02-27 DIAGNOSIS — I10 Essential (primary) hypertension: Secondary | ICD-10-CM | POA: Diagnosis not present

## 2021-02-27 DIAGNOSIS — F419 Anxiety disorder, unspecified: Secondary | ICD-10-CM | POA: Diagnosis not present

## 2021-03-04 DIAGNOSIS — M1990 Unspecified osteoarthritis, unspecified site: Secondary | ICD-10-CM | POA: Diagnosis not present

## 2021-03-04 DIAGNOSIS — I129 Hypertensive chronic kidney disease with stage 1 through stage 4 chronic kidney disease, or unspecified chronic kidney disease: Secondary | ICD-10-CM | POA: Diagnosis not present

## 2021-03-04 DIAGNOSIS — Z471 Aftercare following joint replacement surgery: Secondary | ICD-10-CM | POA: Diagnosis not present

## 2021-03-04 DIAGNOSIS — E1122 Type 2 diabetes mellitus with diabetic chronic kidney disease: Secondary | ICD-10-CM | POA: Diagnosis not present

## 2021-03-04 DIAGNOSIS — E1142 Type 2 diabetes mellitus with diabetic polyneuropathy: Secondary | ICD-10-CM | POA: Diagnosis not present

## 2021-03-04 DIAGNOSIS — E538 Deficiency of other specified B group vitamins: Secondary | ICD-10-CM | POA: Diagnosis not present

## 2021-03-04 DIAGNOSIS — I480 Paroxysmal atrial fibrillation: Secondary | ICD-10-CM | POA: Diagnosis not present

## 2021-03-04 DIAGNOSIS — N189 Chronic kidney disease, unspecified: Secondary | ICD-10-CM | POA: Diagnosis not present

## 2021-03-04 DIAGNOSIS — F411 Generalized anxiety disorder: Secondary | ICD-10-CM | POA: Diagnosis not present

## 2021-03-05 DIAGNOSIS — M1990 Unspecified osteoarthritis, unspecified site: Secondary | ICD-10-CM | POA: Diagnosis not present

## 2021-03-05 DIAGNOSIS — I129 Hypertensive chronic kidney disease with stage 1 through stage 4 chronic kidney disease, or unspecified chronic kidney disease: Secondary | ICD-10-CM | POA: Diagnosis not present

## 2021-03-05 DIAGNOSIS — N189 Chronic kidney disease, unspecified: Secondary | ICD-10-CM | POA: Diagnosis not present

## 2021-03-05 DIAGNOSIS — E1142 Type 2 diabetes mellitus with diabetic polyneuropathy: Secondary | ICD-10-CM | POA: Diagnosis not present

## 2021-03-05 DIAGNOSIS — I480 Paroxysmal atrial fibrillation: Secondary | ICD-10-CM | POA: Diagnosis not present

## 2021-03-05 DIAGNOSIS — E1122 Type 2 diabetes mellitus with diabetic chronic kidney disease: Secondary | ICD-10-CM | POA: Diagnosis not present

## 2021-03-05 DIAGNOSIS — Z471 Aftercare following joint replacement surgery: Secondary | ICD-10-CM | POA: Diagnosis not present

## 2021-03-05 DIAGNOSIS — E538 Deficiency of other specified B group vitamins: Secondary | ICD-10-CM | POA: Diagnosis not present

## 2021-03-05 DIAGNOSIS — F411 Generalized anxiety disorder: Secondary | ICD-10-CM | POA: Diagnosis not present

## 2021-03-10 DIAGNOSIS — I129 Hypertensive chronic kidney disease with stage 1 through stage 4 chronic kidney disease, or unspecified chronic kidney disease: Secondary | ICD-10-CM | POA: Diagnosis not present

## 2021-03-10 DIAGNOSIS — N189 Chronic kidney disease, unspecified: Secondary | ICD-10-CM | POA: Diagnosis not present

## 2021-03-10 DIAGNOSIS — E1122 Type 2 diabetes mellitus with diabetic chronic kidney disease: Secondary | ICD-10-CM | POA: Diagnosis not present

## 2021-03-10 DIAGNOSIS — F411 Generalized anxiety disorder: Secondary | ICD-10-CM | POA: Diagnosis not present

## 2021-03-10 DIAGNOSIS — E1142 Type 2 diabetes mellitus with diabetic polyneuropathy: Secondary | ICD-10-CM | POA: Diagnosis not present

## 2021-03-10 DIAGNOSIS — I480 Paroxysmal atrial fibrillation: Secondary | ICD-10-CM | POA: Diagnosis not present

## 2021-03-10 DIAGNOSIS — M1990 Unspecified osteoarthritis, unspecified site: Secondary | ICD-10-CM | POA: Diagnosis not present

## 2021-03-11 DIAGNOSIS — E1142 Type 2 diabetes mellitus with diabetic polyneuropathy: Secondary | ICD-10-CM | POA: Diagnosis not present

## 2021-03-11 DIAGNOSIS — E538 Deficiency of other specified B group vitamins: Secondary | ICD-10-CM | POA: Diagnosis not present

## 2021-03-11 DIAGNOSIS — E1122 Type 2 diabetes mellitus with diabetic chronic kidney disease: Secondary | ICD-10-CM | POA: Diagnosis not present

## 2021-03-11 DIAGNOSIS — N189 Chronic kidney disease, unspecified: Secondary | ICD-10-CM | POA: Diagnosis not present

## 2021-03-11 DIAGNOSIS — I129 Hypertensive chronic kidney disease with stage 1 through stage 4 chronic kidney disease, or unspecified chronic kidney disease: Secondary | ICD-10-CM | POA: Diagnosis not present

## 2021-03-11 DIAGNOSIS — M1000 Idiopathic gout, unspecified site: Secondary | ICD-10-CM | POA: Diagnosis not present

## 2021-03-11 DIAGNOSIS — F411 Generalized anxiety disorder: Secondary | ICD-10-CM | POA: Diagnosis not present

## 2021-03-11 DIAGNOSIS — I480 Paroxysmal atrial fibrillation: Secondary | ICD-10-CM | POA: Diagnosis not present

## 2021-03-11 DIAGNOSIS — M1990 Unspecified osteoarthritis, unspecified site: Secondary | ICD-10-CM | POA: Diagnosis not present

## 2021-03-16 DIAGNOSIS — I129 Hypertensive chronic kidney disease with stage 1 through stage 4 chronic kidney disease, or unspecified chronic kidney disease: Secondary | ICD-10-CM | POA: Diagnosis not present

## 2021-03-16 DIAGNOSIS — E1122 Type 2 diabetes mellitus with diabetic chronic kidney disease: Secondary | ICD-10-CM | POA: Diagnosis not present

## 2021-03-16 DIAGNOSIS — M1990 Unspecified osteoarthritis, unspecified site: Secondary | ICD-10-CM | POA: Diagnosis not present

## 2021-03-16 DIAGNOSIS — I480 Paroxysmal atrial fibrillation: Secondary | ICD-10-CM | POA: Diagnosis not present

## 2021-03-16 DIAGNOSIS — M1000 Idiopathic gout, unspecified site: Secondary | ICD-10-CM | POA: Diagnosis not present

## 2021-03-16 DIAGNOSIS — E538 Deficiency of other specified B group vitamins: Secondary | ICD-10-CM | POA: Diagnosis not present

## 2021-03-16 DIAGNOSIS — N189 Chronic kidney disease, unspecified: Secondary | ICD-10-CM | POA: Diagnosis not present

## 2021-03-16 DIAGNOSIS — E1142 Type 2 diabetes mellitus with diabetic polyneuropathy: Secondary | ICD-10-CM | POA: Diagnosis not present

## 2021-03-16 DIAGNOSIS — F411 Generalized anxiety disorder: Secondary | ICD-10-CM | POA: Diagnosis not present

## 2021-03-23 DIAGNOSIS — E1142 Type 2 diabetes mellitus with diabetic polyneuropathy: Secondary | ICD-10-CM | POA: Diagnosis not present

## 2021-03-23 DIAGNOSIS — I129 Hypertensive chronic kidney disease with stage 1 through stage 4 chronic kidney disease, or unspecified chronic kidney disease: Secondary | ICD-10-CM | POA: Diagnosis not present

## 2021-03-23 DIAGNOSIS — I480 Paroxysmal atrial fibrillation: Secondary | ICD-10-CM | POA: Diagnosis not present

## 2021-03-23 DIAGNOSIS — E538 Deficiency of other specified B group vitamins: Secondary | ICD-10-CM | POA: Diagnosis not present

## 2021-03-23 DIAGNOSIS — N189 Chronic kidney disease, unspecified: Secondary | ICD-10-CM | POA: Diagnosis not present

## 2021-03-23 DIAGNOSIS — M1000 Idiopathic gout, unspecified site: Secondary | ICD-10-CM | POA: Diagnosis not present

## 2021-03-23 DIAGNOSIS — F411 Generalized anxiety disorder: Secondary | ICD-10-CM | POA: Diagnosis not present

## 2021-03-23 DIAGNOSIS — M1990 Unspecified osteoarthritis, unspecified site: Secondary | ICD-10-CM | POA: Diagnosis not present

## 2021-03-23 DIAGNOSIS — E1122 Type 2 diabetes mellitus with diabetic chronic kidney disease: Secondary | ICD-10-CM | POA: Diagnosis not present

## 2021-03-25 DIAGNOSIS — S46011D Strain of muscle(s) and tendon(s) of the rotator cuff of right shoulder, subsequent encounter: Secondary | ICD-10-CM | POA: Diagnosis not present

## 2021-03-30 DIAGNOSIS — I129 Hypertensive chronic kidney disease with stage 1 through stage 4 chronic kidney disease, or unspecified chronic kidney disease: Secondary | ICD-10-CM | POA: Diagnosis not present

## 2021-03-30 DIAGNOSIS — M1000 Idiopathic gout, unspecified site: Secondary | ICD-10-CM | POA: Diagnosis not present

## 2021-03-30 DIAGNOSIS — N189 Chronic kidney disease, unspecified: Secondary | ICD-10-CM | POA: Diagnosis not present

## 2021-03-30 DIAGNOSIS — E1142 Type 2 diabetes mellitus with diabetic polyneuropathy: Secondary | ICD-10-CM | POA: Diagnosis not present

## 2021-03-30 DIAGNOSIS — E1122 Type 2 diabetes mellitus with diabetic chronic kidney disease: Secondary | ICD-10-CM | POA: Diagnosis not present

## 2021-03-30 DIAGNOSIS — F411 Generalized anxiety disorder: Secondary | ICD-10-CM | POA: Diagnosis not present

## 2021-03-30 DIAGNOSIS — E538 Deficiency of other specified B group vitamins: Secondary | ICD-10-CM | POA: Diagnosis not present

## 2021-03-30 DIAGNOSIS — I480 Paroxysmal atrial fibrillation: Secondary | ICD-10-CM | POA: Diagnosis not present

## 2021-03-30 DIAGNOSIS — M1990 Unspecified osteoarthritis, unspecified site: Secondary | ICD-10-CM | POA: Diagnosis not present

## 2021-04-06 DIAGNOSIS — I129 Hypertensive chronic kidney disease with stage 1 through stage 4 chronic kidney disease, or unspecified chronic kidney disease: Secondary | ICD-10-CM | POA: Diagnosis not present

## 2021-04-06 DIAGNOSIS — E1122 Type 2 diabetes mellitus with diabetic chronic kidney disease: Secondary | ICD-10-CM | POA: Diagnosis not present

## 2021-04-06 DIAGNOSIS — M1000 Idiopathic gout, unspecified site: Secondary | ICD-10-CM | POA: Diagnosis not present

## 2021-04-06 DIAGNOSIS — F411 Generalized anxiety disorder: Secondary | ICD-10-CM | POA: Diagnosis not present

## 2021-04-06 DIAGNOSIS — E538 Deficiency of other specified B group vitamins: Secondary | ICD-10-CM | POA: Diagnosis not present

## 2021-04-06 DIAGNOSIS — M1990 Unspecified osteoarthritis, unspecified site: Secondary | ICD-10-CM | POA: Diagnosis not present

## 2021-04-06 DIAGNOSIS — E1142 Type 2 diabetes mellitus with diabetic polyneuropathy: Secondary | ICD-10-CM | POA: Diagnosis not present

## 2021-04-06 DIAGNOSIS — I480 Paroxysmal atrial fibrillation: Secondary | ICD-10-CM | POA: Diagnosis not present

## 2021-04-06 DIAGNOSIS — N189 Chronic kidney disease, unspecified: Secondary | ICD-10-CM | POA: Diagnosis not present

## 2021-04-13 DIAGNOSIS — M1000 Idiopathic gout, unspecified site: Secondary | ICD-10-CM | POA: Diagnosis not present

## 2021-04-13 DIAGNOSIS — F411 Generalized anxiety disorder: Secondary | ICD-10-CM | POA: Diagnosis not present

## 2021-04-13 DIAGNOSIS — I480 Paroxysmal atrial fibrillation: Secondary | ICD-10-CM | POA: Diagnosis not present

## 2021-04-13 DIAGNOSIS — M1990 Unspecified osteoarthritis, unspecified site: Secondary | ICD-10-CM | POA: Diagnosis not present

## 2021-04-13 DIAGNOSIS — E1142 Type 2 diabetes mellitus with diabetic polyneuropathy: Secondary | ICD-10-CM | POA: Diagnosis not present

## 2021-04-13 DIAGNOSIS — I129 Hypertensive chronic kidney disease with stage 1 through stage 4 chronic kidney disease, or unspecified chronic kidney disease: Secondary | ICD-10-CM | POA: Diagnosis not present

## 2021-04-13 DIAGNOSIS — N189 Chronic kidney disease, unspecified: Secondary | ICD-10-CM | POA: Diagnosis not present

## 2021-04-13 DIAGNOSIS — E538 Deficiency of other specified B group vitamins: Secondary | ICD-10-CM | POA: Diagnosis not present

## 2021-04-13 DIAGNOSIS — E1122 Type 2 diabetes mellitus with diabetic chronic kidney disease: Secondary | ICD-10-CM | POA: Diagnosis not present

## 2021-04-23 DIAGNOSIS — E1142 Type 2 diabetes mellitus with diabetic polyneuropathy: Secondary | ICD-10-CM | POA: Diagnosis not present

## 2021-04-23 DIAGNOSIS — E1122 Type 2 diabetes mellitus with diabetic chronic kidney disease: Secondary | ICD-10-CM | POA: Diagnosis not present

## 2021-04-23 DIAGNOSIS — F411 Generalized anxiety disorder: Secondary | ICD-10-CM | POA: Diagnosis not present

## 2021-04-23 DIAGNOSIS — M1000 Idiopathic gout, unspecified site: Secondary | ICD-10-CM | POA: Diagnosis not present

## 2021-04-23 DIAGNOSIS — I480 Paroxysmal atrial fibrillation: Secondary | ICD-10-CM | POA: Diagnosis not present

## 2021-04-23 DIAGNOSIS — M1990 Unspecified osteoarthritis, unspecified site: Secondary | ICD-10-CM | POA: Diagnosis not present

## 2021-04-23 DIAGNOSIS — E538 Deficiency of other specified B group vitamins: Secondary | ICD-10-CM | POA: Diagnosis not present

## 2021-04-23 DIAGNOSIS — N189 Chronic kidney disease, unspecified: Secondary | ICD-10-CM | POA: Diagnosis not present

## 2021-04-23 DIAGNOSIS — I129 Hypertensive chronic kidney disease with stage 1 through stage 4 chronic kidney disease, or unspecified chronic kidney disease: Secondary | ICD-10-CM | POA: Diagnosis not present

## 2021-04-28 DIAGNOSIS — E538 Deficiency of other specified B group vitamins: Secondary | ICD-10-CM | POA: Diagnosis not present

## 2021-04-28 DIAGNOSIS — M1000 Idiopathic gout, unspecified site: Secondary | ICD-10-CM | POA: Diagnosis not present

## 2021-04-28 DIAGNOSIS — F411 Generalized anxiety disorder: Secondary | ICD-10-CM | POA: Diagnosis not present

## 2021-04-28 DIAGNOSIS — I129 Hypertensive chronic kidney disease with stage 1 through stage 4 chronic kidney disease, or unspecified chronic kidney disease: Secondary | ICD-10-CM | POA: Diagnosis not present

## 2021-04-28 DIAGNOSIS — M1990 Unspecified osteoarthritis, unspecified site: Secondary | ICD-10-CM | POA: Diagnosis not present

## 2021-04-28 DIAGNOSIS — I480 Paroxysmal atrial fibrillation: Secondary | ICD-10-CM | POA: Diagnosis not present

## 2021-04-28 DIAGNOSIS — N189 Chronic kidney disease, unspecified: Secondary | ICD-10-CM | POA: Diagnosis not present

## 2021-04-28 DIAGNOSIS — E1142 Type 2 diabetes mellitus with diabetic polyneuropathy: Secondary | ICD-10-CM | POA: Diagnosis not present

## 2021-04-28 DIAGNOSIS — E1122 Type 2 diabetes mellitus with diabetic chronic kidney disease: Secondary | ICD-10-CM | POA: Diagnosis not present

## 2021-05-07 DIAGNOSIS — E538 Deficiency of other specified B group vitamins: Secondary | ICD-10-CM | POA: Diagnosis not present

## 2021-05-07 DIAGNOSIS — M1990 Unspecified osteoarthritis, unspecified site: Secondary | ICD-10-CM | POA: Diagnosis not present

## 2021-05-07 DIAGNOSIS — N189 Chronic kidney disease, unspecified: Secondary | ICD-10-CM | POA: Diagnosis not present

## 2021-05-07 DIAGNOSIS — I480 Paroxysmal atrial fibrillation: Secondary | ICD-10-CM | POA: Diagnosis not present

## 2021-05-07 DIAGNOSIS — M1000 Idiopathic gout, unspecified site: Secondary | ICD-10-CM | POA: Diagnosis not present

## 2021-05-07 DIAGNOSIS — I129 Hypertensive chronic kidney disease with stage 1 through stage 4 chronic kidney disease, or unspecified chronic kidney disease: Secondary | ICD-10-CM | POA: Diagnosis not present

## 2021-05-07 DIAGNOSIS — F411 Generalized anxiety disorder: Secondary | ICD-10-CM | POA: Diagnosis not present

## 2021-05-07 DIAGNOSIS — E1122 Type 2 diabetes mellitus with diabetic chronic kidney disease: Secondary | ICD-10-CM | POA: Diagnosis not present

## 2021-05-07 DIAGNOSIS — E1142 Type 2 diabetes mellitus with diabetic polyneuropathy: Secondary | ICD-10-CM | POA: Diagnosis not present

## 2021-06-30 DIAGNOSIS — H6122 Impacted cerumen, left ear: Secondary | ICD-10-CM | POA: Diagnosis not present

## 2021-08-05 DIAGNOSIS — M109 Gout, unspecified: Secondary | ICD-10-CM | POA: Diagnosis not present

## 2021-08-05 DIAGNOSIS — Z79899 Other long term (current) drug therapy: Secondary | ICD-10-CM | POA: Diagnosis not present

## 2021-08-05 DIAGNOSIS — E1151 Type 2 diabetes mellitus with diabetic peripheral angiopathy without gangrene: Secondary | ICD-10-CM | POA: Diagnosis not present

## 2021-08-11 DIAGNOSIS — E1151 Type 2 diabetes mellitus with diabetic peripheral angiopathy without gangrene: Secondary | ICD-10-CM | POA: Diagnosis not present

## 2021-08-11 DIAGNOSIS — M1711 Unilateral primary osteoarthritis, right knee: Secondary | ICD-10-CM | POA: Diagnosis not present

## 2021-08-12 DIAGNOSIS — Z1389 Encounter for screening for other disorder: Secondary | ICD-10-CM | POA: Diagnosis not present

## 2021-08-12 DIAGNOSIS — Z0000 Encounter for general adult medical examination without abnormal findings: Secondary | ICD-10-CM | POA: Diagnosis not present

## 2021-08-12 DIAGNOSIS — E1122 Type 2 diabetes mellitus with diabetic chronic kidney disease: Secondary | ICD-10-CM | POA: Diagnosis not present

## 2021-08-12 DIAGNOSIS — N183 Chronic kidney disease, stage 3 unspecified: Secondary | ICD-10-CM | POA: Diagnosis not present

## 2021-08-12 DIAGNOSIS — I69351 Hemiplegia and hemiparesis following cerebral infarction affecting right dominant side: Secondary | ICD-10-CM | POA: Diagnosis not present

## 2021-08-12 DIAGNOSIS — M109 Gout, unspecified: Secondary | ICD-10-CM | POA: Diagnosis not present

## 2021-08-26 DIAGNOSIS — H35372 Puckering of macula, left eye: Secondary | ICD-10-CM | POA: Diagnosis not present

## 2021-10-01 DIAGNOSIS — M1711 Unilateral primary osteoarthritis, right knee: Secondary | ICD-10-CM | POA: Diagnosis not present

## 2021-10-04 DIAGNOSIS — M1711 Unilateral primary osteoarthritis, right knee: Secondary | ICD-10-CM | POA: Insufficient documentation

## 2021-10-21 NOTE — Discharge Instructions (Signed)
Instructions after Total Knee Replacement   Tzirel Leonor P. Sandee Bernath, Jr., M.D.     Dept. of Orthopaedics & Sports Medicine  [name]  [address]  [place], [place]  [address]  Phone: [phone]   Fax: [phone]    DIET: Drink plenty of non-alcoholic fluids. Resume your normal diet. Include foods high in fiber.  ACTIVITY:  You may use crutches or a walker with weight-bearing as tolerated, unless instructed otherwise. You may be weaned off of the walker or crutches by your Physical Therapist.  Do NOT place pillows under the knee. Anything placed under the knee could limit your ability to straighten the knee.   Continue doing gentle exercises. Exercising will reduce the pain and swelling, increase motion, and prevent muscle weakness.   Please continue to use the TED compression stockings for 6 weeks. You may remove the stockings at night, but should reapply them in the morning. Do not drive or operate any equipment until instructed.  WOUND CARE:  Continue to use the PolarCare or ice packs periodically to reduce pain and swelling. You may bathe or shower after the staples are removed at the first office visit following surgery.  MEDICATIONS: You may resume your regular medications. Please take the pain medication as prescribed on the medication. Do not take pain medication on an empty stomach. You have been given a prescription for a blood thinner (Lovenox or Coumadin). Please take the medication as instructed. (NOTE: After completing a 2 week course of Lovenox, take one Enteric-coated aspirin once a day. This along with elevation will help reduce the possibility of phlebitis in your operated leg.) Do not drive or drink alcoholic beverages when taking pain medications.  CALL THE OFFICE FOR: Temperature above 101 degrees Excessive bleeding or drainage on the dressing. Excessive swelling, coldness, or paleness of the toes. Persistent nausea and vomiting.  FOLLOW-UP:  You  should have an appointment to return to the office in 10-14 days after surgery. Arrangements have been made for continuation of Physical Therapy (either home therapy or outpatient therapy).   [name] Department Directory         [email or web address]       [email or web address]          Cardiology  Appointments: [place] - [phone] [place] - [phone]  Endocrinology  Appointments: [place] - [phone] [place] - [phone]  Gastroenterology  Appointments: [place] - [phone] [place] - [phone]        General Surgery   Appointments: [place] - [phone]  Internal Medicine/Family Medicine  Appointments: [place] - [phone] [place] - [phone] [place] - [phone]  Metabolic and Weigh Loss Surgery  Appointments: [place] - [phone]        Neurology  Appointments: [place] - [phone] [place] - [phone]  Neurosurgery  Appointments: [place] - [phone]  Obstetrics & Gynecology  Appointments: [place] - [phone] [place] - [phone]        Pediatrics  Appointments: [place] - [phone] [place] - [phone]  Physiatry  Appointments: [place] -[phone]  Physical Therapy  Appointments: [place] - [phone] [place] - [phone]        Podiatry  Appointments: [place] - [phone] [place] - [phone]  Pulmonology  Appointments: [place] - [phone]  Rheumatology  Appointments: [place] - [phone]        [place] Location: [name]  [address] [place], [place]  [address]  [place] Location: [name] [address] [place], [place]  [address]  [place] Location: [name] [address] [place], [place]  [address]    

## 2021-10-23 ENCOUNTER — Encounter: Payer: Self-pay | Admitting: *Deleted

## 2021-10-23 ENCOUNTER — Encounter
Admission: RE | Admit: 2021-10-23 | Discharge: 2021-10-23 | Disposition: A | Discharge status: Home / Self Care | Payer: Medicare HMO | Source: Ambulatory Visit | Attending: Orthopedic Surgery | Admitting: Orthopedic Surgery

## 2021-10-23 VITALS — BP 122/75 | HR 62 | Temp 98.2°F | Resp 16 | Ht 62.0 in | Wt 204.5 lb

## 2021-10-23 DIAGNOSIS — Z01818 Encounter for other preprocedural examination: Secondary | ICD-10-CM | POA: Diagnosis not present

## 2021-10-23 DIAGNOSIS — M109 Gout, unspecified: Secondary | ICD-10-CM | POA: Diagnosis not present

## 2021-10-23 DIAGNOSIS — E1151 Type 2 diabetes mellitus with diabetic peripheral angiopathy without gangrene: Secondary | ICD-10-CM | POA: Insufficient documentation

## 2021-10-23 DIAGNOSIS — N184 Chronic kidney disease, stage 4 (severe): Secondary | ICD-10-CM | POA: Insufficient documentation

## 2021-10-23 DIAGNOSIS — Z01810 Encounter for preprocedural cardiovascular examination: Secondary | ICD-10-CM | POA: Diagnosis not present

## 2021-10-23 DIAGNOSIS — M1711 Unilateral primary osteoarthritis, right knee: Secondary | ICD-10-CM | POA: Diagnosis not present

## 2021-10-23 DIAGNOSIS — Z01812 Encounter for preprocedural laboratory examination: Secondary | ICD-10-CM

## 2021-10-23 DIAGNOSIS — E1122 Type 2 diabetes mellitus with diabetic chronic kidney disease: Secondary | ICD-10-CM | POA: Insufficient documentation

## 2021-10-23 LAB — CBC
HCT: 47.4 % — ABNORMAL HIGH (ref 36.0–46.0)
Hemoglobin: 15.6 g/dL — ABNORMAL HIGH (ref 12.0–15.0)
MCH: 29.3 pg (ref 26.0–34.0)
MCHC: 32.9 g/dL (ref 30.0–36.0)
MCV: 89.1 fL (ref 80.0–100.0)
Platelets: 279 10*3/uL (ref 150–400)
RBC: 5.32 MIL/uL — ABNORMAL HIGH (ref 3.87–5.11)
RDW: 13.2 % (ref 11.5–15.5)
WBC: 9.5 10*3/uL (ref 4.0–10.5)
nRBC: 0 % (ref 0.0–0.2)

## 2021-10-23 LAB — COMPREHENSIVE METABOLIC PANEL
ALT: 30 U/L (ref 0–44)
AST: 31 U/L (ref 15–41)
Albumin: 3.8 g/dL (ref 3.5–5.0)
Alkaline Phosphatase: 59 U/L (ref 38–126)
Anion gap: 8 (ref 5–15)
BUN: 27 mg/dL — ABNORMAL HIGH (ref 8–23)
CO2: 26 mmol/L (ref 22–32)
Calcium: 9.8 mg/dL (ref 8.9–10.3)
Chloride: 103 mmol/L (ref 98–111)
Creatinine, Ser: 1.38 mg/dL — ABNORMAL HIGH (ref 0.44–1.00)
GFR, Estimated: 39 mL/min — ABNORMAL LOW (ref >60)
Glucose, Bld: 107 mg/dL — ABNORMAL HIGH (ref 70–99)
Potassium: 4.4 mmol/L (ref 3.5–5.1)
Sodium: 137 mmol/L (ref 135–145)
Total Bilirubin: 1.2 mg/dL (ref 0.3–1.2)
Total Protein: 7.5 g/dL (ref 6.5–8.1)

## 2021-10-23 LAB — TYPE AND SCREEN
ABO/RH(D): A POS
Antibody Screen: NEGATIVE

## 2021-10-23 LAB — C-REACTIVE PROTEIN: CRP: 0.5 mg/dL (ref <1.0)

## 2021-10-23 LAB — HEMOGLOBIN A1C
Hgb A1c MFr Bld: 6.7 % — ABNORMAL HIGH (ref 4.8–5.6)
Mean Plasma Glucose: 145.59 mg/dL

## 2021-10-23 LAB — SEDIMENTATION RATE: Sed Rate: 13 mm/hr (ref 0–30)

## 2021-10-23 LAB — SURGICAL PCR SCREEN
MRSA, PCR: NEGATIVE
Staphylococcus aureus: NEGATIVE

## 2021-10-23 NOTE — Patient Instructions (Addendum)
Your procedure is scheduled on: Friday November 06, 2021. Report to Day Surgery inside [facility] 2nd floor, stop by admissions desk before getting on elevator.  To find out your arrival time please call [phone] between 1PM - 3PM on Thursday November 05, 2021.  Remember: Instructions that are not followed completely may result in serious medical risk,  up to and including death, or upon the discretion of your surgeon and anesthesiologist your  surgery may need to be rescheduled.     _X__ 1. Do not eat food after midnight the night before your procedure.                 No chewing gum or hard candies. You may drink clear liquids up to 2 hours                 before you are scheduled to arrive for your surgery- DO not drink clear                 liquids within 2 hours of the start of your surgery.                 Clear Liquids include:  water, apple juice without pulp, clear Gatorade, G2 or                  Gatorade Zero (avoid Red/Purple/Blue), Black Coffee or Tea (Do not add                 anything to coffee or tea).  __X__2.   Complete the "Ensure Clear Pre-surgery Clear Carbohydrate Drink" provided to you, 2 hours before arrival. **If you are diabetic you will be provided with an alternative drink, Gatorade Zero or G2.  __X__3.  On the morning of surgery brush your teeth with toothpaste and water, you                may rinse your mouth with mouthwash if you wish.  Do not swallow any toothpaste of mouthwash.     _X__ 4.  No Alcohol for 24 hours before or after surgery.   _X__ 5.  Do Not Smoke or use e-cigarettes For 24 Hours Prior to Your Surgery.                 Do not use any chewable tobacco products for at least 6 hours prior to                 Surgery.  _X__  6.  Do not use any recreational drugs (marijuana, cocaine, heroin, ecstasy, MDMA or other)                For at least one week prior to your surgery.  Combination of these drugs with anesthesia                 May have life threatening results.  ____ 7.  Bring all medications with you on the day of surgery if instructed.   __X_ 8.  Notify your doctor if there is any change in your medical condition      (cold, fever, infections).     Do not wear jewelry, make-up, hairpins, clips or nail polish. Do not wear lotions, powders, or perfumes. You may wear deodorant. Do not shave 48 hours prior to surgery. Men may shave face and neck. Do not bring valuables to the hospital.    [facility] is not responsible for any belongings or valuables.  Contacts,  dentures or bridgework may not be worn into surgery. Leave your suitcase in the car. After surgery it may be brought to your room. For patients admitted to the hospital, discharge time is determined by your treatment team.   Patients discharged the day of surgery will not be allowed to drive home.   Make arrangements for someone to be with you for the first 24 hours of your Same Day Discharge.   __X__ Take these medicines the morning of surgery with A SIP OF WATER:    1. allopurinol (ZYLOPRIM) 100 MG  2. ALPRAZolam (XANAX) 0.5 MG (if needed)  3. amLODipine (NORVASC) 10 MG  4. gabapentin (NEURONTIN) 100 MG  5. metoprolol (LOPRESSOR) 100 MG  6.   ____ Fleet Enema (as directed)   __X__ Use CHG Soap (or wipes) as directed  ____ Use Benzoyl Peroxide Gel as instructed  ____ Use inhalers on the day of surgery  __X__ Stop metFORMIN (GLUCOPHAGE) 500 mg  2 days prior to surgery (take last dose Tuesday 11/03/21)    ____ Take 1/2 of usual insulin dose the night before surgery. No insulin the morning          of surgery.   __X__ Ask your doctor on 10/29/21 when you need to stop aspirin 81 mg before your surgery.   __X__ One Week prior to surgery- Stop Anti-inflammatories such as Ibuprofen, Aleve, Advil, Motrin, meloxicam (MOBIC), diclofenac, etodolac, ketorolac, Toradol, Daypro, piroxicam, Goody's or BC powders. OK TO USE TYLENOL IF  NEEDED   ___X_ Stop these supplements until after surgery. Melatonin, doTerra  ____ Bring C-Pap to the hospital.    If you have any questions regarding your pre-procedure instructions,  Please call Pre-admit Testing at [phone]

## 2021-10-25 ENCOUNTER — Encounter: Payer: Self-pay | Admitting: Medical Oncology

## 2021-10-25 ENCOUNTER — Emergency Department: Payer: Medicare HMO

## 2021-10-25 ENCOUNTER — Emergency Department
Admission: EM | Admit: 2021-10-25 | Discharge: 2021-10-25 | Disposition: A | Discharge status: Home / Self Care | Payer: Medicare HMO | Attending: Emergency Medicine | Admitting: Emergency Medicine

## 2021-10-25 DIAGNOSIS — M545 Low back pain, unspecified: Secondary | ICD-10-CM | POA: Insufficient documentation

## 2021-10-25 DIAGNOSIS — R109 Unspecified abdominal pain: Secondary | ICD-10-CM | POA: Insufficient documentation

## 2021-10-25 DIAGNOSIS — N184 Chronic kidney disease, stage 4 (severe): Secondary | ICD-10-CM | POA: Insufficient documentation

## 2021-10-25 DIAGNOSIS — E1122 Type 2 diabetes mellitus with diabetic chronic kidney disease: Secondary | ICD-10-CM | POA: Insufficient documentation

## 2021-10-25 DIAGNOSIS — N390 Urinary tract infection, site not specified: Secondary | ICD-10-CM | POA: Insufficient documentation

## 2021-10-25 DIAGNOSIS — M546 Pain in thoracic spine: Secondary | ICD-10-CM | POA: Diagnosis not present

## 2021-10-25 DIAGNOSIS — R350 Frequency of micturition: Secondary | ICD-10-CM | POA: Diagnosis present

## 2021-10-25 DIAGNOSIS — N200 Calculus of kidney: Secondary | ICD-10-CM | POA: Diagnosis not present

## 2021-10-25 DIAGNOSIS — I129 Hypertensive chronic kidney disease with stage 1 through stage 4 chronic kidney disease, or unspecified chronic kidney disease: Secondary | ICD-10-CM | POA: Diagnosis not present

## 2021-10-25 DIAGNOSIS — K8689 Other specified diseases of pancreas: Secondary | ICD-10-CM | POA: Diagnosis not present

## 2021-10-25 DIAGNOSIS — N3090 Cystitis, unspecified without hematuria: Secondary | ICD-10-CM | POA: Diagnosis not present

## 2021-10-25 DIAGNOSIS — D259 Leiomyoma of uterus, unspecified: Secondary | ICD-10-CM | POA: Diagnosis not present

## 2021-10-25 LAB — URINALYSIS, ROUTINE W REFLEX MICROSCOPIC
Bilirubin Urine: NEGATIVE
Glucose, UA: NEGATIVE mg/dL
Hgb urine dipstick: NEGATIVE
Ketones, ur: NEGATIVE mg/dL
Nitrite: NEGATIVE
Protein, ur: 100 mg/dL — AB
Specific Gravity, Urine: 1.014 (ref 1.005–1.030)
WBC, UA: >50 WBC/hpf — ABNORMAL HIGH (ref 0–5)
pH: 5 (ref 5.0–8.0)

## 2021-10-25 LAB — CBC WITH DIFFERENTIAL/PLATELET
Abs Immature Granulocytes: 0.04 10*3/uL (ref 0.00–0.07)
Basophils Absolute: 0.1 10*3/uL (ref 0.0–0.1)
Basophils Relative: 1 %
Eosinophils Absolute: 0.5 10*3/uL (ref 0.0–0.5)
Eosinophils Relative: 6 %
HCT: 46.2 % — ABNORMAL HIGH (ref 36.0–46.0)
Hemoglobin: 15.2 g/dL — ABNORMAL HIGH (ref 12.0–15.0)
Immature Granulocytes: 1 %
Lymphocytes Relative: 29 %
Lymphs Abs: 2.5 10*3/uL (ref 0.7–4.0)
MCH: 29.5 pg (ref 26.0–34.0)
MCHC: 32.9 g/dL (ref 30.0–36.0)
MCV: 89.7 fL (ref 80.0–100.0)
Monocytes Absolute: 0.6 10*3/uL (ref 0.1–1.0)
Monocytes Relative: 8 %
Neutro Abs: 4.8 10*3/uL (ref 1.7–7.7)
Neutrophils Relative %: 55 %
Platelets: 257 10*3/uL (ref 150–400)
RBC: 5.15 MIL/uL — ABNORMAL HIGH (ref 3.87–5.11)
RDW: 13.2 % (ref 11.5–15.5)
WBC: 8.6 10*3/uL (ref 4.0–10.5)
nRBC: 0 % (ref 0.0–0.2)

## 2021-10-25 LAB — BASIC METABOLIC PANEL
Anion gap: 9 (ref 5–15)
BUN: 21 mg/dL (ref 8–23)
CO2: 25 mmol/L (ref 22–32)
Calcium: 9.5 mg/dL (ref 8.9–10.3)
Chloride: 103 mmol/L (ref 98–111)
Creatinine, Ser: 1.26 mg/dL — ABNORMAL HIGH (ref 0.44–1.00)
GFR, Estimated: 44 mL/min — ABNORMAL LOW (ref >60)
Glucose, Bld: 94 mg/dL (ref 70–99)
Potassium: 3.8 mmol/L (ref 3.5–5.1)
Sodium: 137 mmol/L (ref 135–145)

## 2021-10-25 MED ORDER — CEFDINIR 300 MG PO CAPS
300.0000 mg | ORAL_CAPSULE | Freq: Two times a day (BID) | ORAL | Status: DC
  Administered 2021-10-25: 300 mg via ORAL
  Filled 2021-10-25: qty 1

## 2021-10-25 MED ORDER — CEFDINIR 300 MG PO CAPS: 300.0000 mg | ORAL_CAPSULE | Freq: Two times a day (BID) | ORAL | 0 refills | Status: AC

## 2021-10-25 NOTE — Discharge Instructions (Addendum)
You have a urinary tract infection which is likely moved to your kidneys which is causing your back pain.  Please take the antibiotic twice a day for the next 7 days.  You can take ibuprofen 400 mg every 6 hours for pain.  If you develop fevers pain is not controlled nausea vomiting and or inability to tolerate the antibiotic please return to the emergency department.

## 2021-10-25 NOTE — ED Provider Triage Note (Signed)
Emergency Medicine Provider Triage Evaluation Note  [name] , a 76 y.o. female  was evaluated in triage.  Pt complains of flank pain, back pain, urinary freq.  Review of Systems  Positive: Flank pain, as above Negative: fever  Physical Exam  There were no vitals taken for this visit. Gen:   Awake, no distress   Resp:  Normal effort  MSK:   Moves extremities without difficulty  Other:    Medical Decision Making  Medically screening exam initiated at 10:24 AM.  Appropriate orders placed.  [name] was informed that the remainder of the evaluation will be completed by another provider, this initial triage assessment does not replace that evaluation, and the importance of remaining in the ED until their evaluation is complete.  Basic labs, ua, ct renal stone ordered   Versie Starks, PA-C 10/25/21 1026

## 2021-10-25 NOTE — ED Provider Notes (Signed)
[facility] Provider Note    Event Date/Time   First MD Initiated Contact with Patient 10/25/21 1119     (approximate)   History   Flank Pain   HPI  Gwendlyn Hanback is a 76 y.o. female past medical history of diabetes kidney stones sleep apnea and with back pain and urinary symptoms.  Symptoms started on Friday.  Patient was at a preop appointment for upcoming knee replacement she had to give a urine specimen and had a lot of difficulty getting it and started having back pain.  Since that time has had left-sided back pain as well as urinary frequency and urgency but no frank dysuria.  Has had UTIs in the past this feels similar.  Denies fevers nausea vomiting or abdominal pain.  Pain is in the left low to mid back.  No hematuria.     Past Medical History:  Diagnosis Date   A-fib ([place])    back in 2013, for stent placement in kidney, she had a bout of a-fib.  Now, back in rhythm   Arthritis    Chronic kidney disease    Complication of anesthesia    Diabetes mellitus without complication ([place])    type 2    only takes metformin   Dyspnea    Dysuria    History of kidney stones    Hypertension    Osteopenia    PONV (postoperative nausea and vomiting)    Sleep apnea    lost over 120 lbs, and no long uses it (close to 2 yrs now)   Stroke [facility])    mini 2003--affected right side of body.   took 6 weeks to get over.   Urine, incontinence, stress female     Patient Active Problem List   Diagnosis Date Noted   Rotator cuff arthropathy 12/08/2020   Pressure injury of skin 12/08/2020   Calculus of hepatic duct    Diabetes mellitus with peripheral angiopathy ([place]) 01/12/2018   Osteoarthritis 07/09/2016   Recurrent nephrolithiasis 07/09/2016   Benign essential hypertension 07/05/2016   History of CVA (cerebrovascular accident) 07/05/2016   Medicare annual wellness visit, initial 07/05/2016   Spinal stenosis of lumbar region with neurogenic  claudication 04/19/2016   OSA on CPAP 12/29/2015   CKD (chronic kidney disease) stage 4, GFR 15-29 ml/min ([place]) 05/29/2015   H/O adenomatous polyp of colon 01/02/2015   Polyarticular gout 05/22/2014   Diabetes mellitus type 2, controlled, with complications ([place]) 55/97/4163   Foreign body in bladder and urethra 08/31/2013   Uric acid nephrolithiasis 08/31/2013   Kidney stone 08/09/2013   Ureteric stone 08/09/2013   Hydronephrosis 84/53/6468   Renal colic 07/29/2246     Physical Exam  Triage Vital Signs: ED Triage Vitals  Enc Vitals Group     BP --      Pulse Rate 10/25/21 1025 66     Resp 10/25/21 1025 18     Temp 10/25/21 1025 98.1 F (36.7 C)     Temp Source 10/25/21 1025 Oral     SpO2 10/25/21 1025 94 %     Weight 10/25/21 1026 202 lb 13.2 oz (92 kg)     Height 10/25/21 1026 '5\' 2"'$  (1.575 m)     Head Circumference --      Peak Flow --      Pain Score 10/25/21 1026 7     Pain Loc --      Pain Edu? --      Excl.  in [place]? --     Most recent vital signs: Vitals:   10/25/21 1248 10/25/21 1324  BP: 140/78 136/75  Pulse:  (!) 58  Resp:  20  Temp:    SpO2:  95%     General: Awake, no distress.  CV:  Good peripheral perfusion.  Resp:  Normal effort.  Abd:  No distention.  Soft and nontender Neuro:             Awake, Alert, Oriented x 3  Other:  Left lumbar paraspinal back TTP no CVA tenderness   ED Results / Procedures / Treatments  Labs (all labs ordered are listed, but only abnormal results are displayed) Labs Reviewed  BASIC METABOLIC PANEL - Abnormal; Notable for the following components:      Result Value   Creatinine, Ser 1.26 (*)    GFR, Estimated 44 (*)    All other components within normal limits  CBC WITH DIFFERENTIAL/PLATELET - Abnormal; Notable for the following components:   RBC 5.15 (*)    Hemoglobin 15.2 (*)    HCT 46.2 (*)    All other components within normal limits  URINALYSIS, ROUTINE W REFLEX MICROSCOPIC - Abnormal; Notable for the  following components:   Color, Urine YELLOW (*)    APPearance HAZY (*)    Protein, ur 100 (*)    Leukocytes,Ua MODERATE (*)    WBC, UA >50 (*)    Bacteria, UA MANY (*)    All other components within normal limits  URINE CULTURE     EKG     RADIOLOGY CT renal study was interpreted by myself no hydronephrosis   PROCEDURES:  Critical Care performed: No  Procedures   MEDICATIONS ORDERED IN ED: Medications  cefdinir (OMNICEF) capsule 300 mg (300 mg Oral Given 10/25/21 1327)     IMPRESSION / MDM / ASSESSMENT AND PLAN / ED COURSE  I reviewed the triage vital signs and the nursing notes.                              Patient's presentation is most consistent with acute presentation with potential threat to life or bodily function.  Differential diagnosis includes, but is not limited to, pyelonephritis, nephrolithiasis, musculoskeletal pain, cystitis  Patient is a 76 year old female presenting with left back pain urinary frequency and urgency.  Symptoms have been going on for the past 2 days.  She has no fevers nausea vomiting no abdominal pain.  Vitals are within normal limits she appears well she has no real CVA tenderness but is tender in the mid to low left back.  Labs are overall reassuring no leukocytosis creatinine is near baseline.  CT renal study was ordered from triage and there is no obstructing stone there is some findings of possible cystitis with some trace gas and perivesicular inflammation inflammatory changes.  Suspect pyelonephritis but patient appears well and and has no systemic symptoms I think she can appropriately be treated with outpatient antibiotics.  We discussed return precautions for fevers inability tolerate p.o. worsening pain etc.  She will follow-up with PCP.  Urine culture sent.   Clinical Course as of 10/25/21 1334  Sun Oct 25, 2021  1242 WBC, UA(!): >50 [KM]    Clinical Course User Index [KM] Rada Hay, MD     FINAL CLINICAL  IMPRESSION(S) / ED DIAGNOSES   Final diagnoses:  Urinary tract infection without hematuria, site unspecified     Rx /  DC Orders   ED Discharge Orders          Ordered    cefdinir (OMNICEF) 300 MG capsule  2 times daily        10/25/21 1249             Note:  This document was prepared using Dragon voice recognition software and may include unintentional dictation errors.   Rada Hay, MD 10/25/21 [phone]

## 2021-10-25 NOTE — ED Triage Notes (Signed)
Pt reports that she began having left flank pain Friday with dysuria. Denies fever.

## 2021-10-27 ENCOUNTER — Other Ambulatory Visit: Payer: Medicare HMO

## 2021-10-27 LAB — URINE CULTURE: Culture: >=100000 — AB

## 2021-10-29 DIAGNOSIS — M1711 Unilateral primary osteoarthritis, right knee: Secondary | ICD-10-CM | POA: Diagnosis not present

## 2021-11-02 DIAGNOSIS — N390 Urinary tract infection, site not specified: Secondary | ICD-10-CM | POA: Diagnosis not present

## 2021-11-02 DIAGNOSIS — N3090 Cystitis, unspecified without hematuria: Secondary | ICD-10-CM | POA: Diagnosis not present

## 2021-11-06 ENCOUNTER — Ambulatory Visit: Payer: Medicare HMO | Admitting: Urgent Care

## 2021-11-06 ENCOUNTER — Observation Stay
Admission: RE | Admit: 2021-11-06 | Discharge: 2021-11-07 | Disposition: A | Discharge status: Home / Self Care | Payer: Medicare HMO | Attending: Orthopedic Surgery | Admitting: Orthopedic Surgery

## 2021-11-06 ENCOUNTER — Other Ambulatory Visit: Payer: Self-pay

## 2021-11-06 ENCOUNTER — Encounter
Admission: RE | Disposition: A | Discharge status: Home / Self Care | Payer: Self-pay | Source: Home / Self Care | Attending: Orthopedic Surgery

## 2021-11-06 ENCOUNTER — Observation Stay: Payer: Medicare HMO

## 2021-11-06 ENCOUNTER — Encounter: Payer: Self-pay | Admitting: Orthopedic Surgery

## 2021-11-06 DIAGNOSIS — Z471 Aftercare following joint replacement surgery: Secondary | ICD-10-CM | POA: Diagnosis not present

## 2021-11-06 DIAGNOSIS — Z96651 Presence of right artificial knee joint: Secondary | ICD-10-CM | POA: Diagnosis not present

## 2021-11-06 DIAGNOSIS — Z79899 Other long term (current) drug therapy: Secondary | ICD-10-CM | POA: Diagnosis not present

## 2021-11-06 DIAGNOSIS — Z96611 Presence of right artificial shoulder joint: Secondary | ICD-10-CM | POA: Insufficient documentation

## 2021-11-06 DIAGNOSIS — E119 Type 2 diabetes mellitus without complications: Secondary | ICD-10-CM | POA: Insufficient documentation

## 2021-11-06 DIAGNOSIS — Z7984 Long term (current) use of oral hypoglycemic drugs: Secondary | ICD-10-CM | POA: Insufficient documentation

## 2021-11-06 DIAGNOSIS — M1711 Unilateral primary osteoarthritis, right knee: Secondary | ICD-10-CM | POA: Diagnosis not present

## 2021-11-06 DIAGNOSIS — N184 Chronic kidney disease, stage 4 (severe): Secondary | ICD-10-CM

## 2021-11-06 DIAGNOSIS — I4891 Unspecified atrial fibrillation: Secondary | ICD-10-CM | POA: Diagnosis not present

## 2021-11-06 DIAGNOSIS — Z7982 Long term (current) use of aspirin: Secondary | ICD-10-CM | POA: Insufficient documentation

## 2021-11-06 DIAGNOSIS — M109 Gout, unspecified: Secondary | ICD-10-CM

## 2021-11-06 DIAGNOSIS — E782 Mixed hyperlipidemia: Secondary | ICD-10-CM | POA: Insufficient documentation

## 2021-11-06 DIAGNOSIS — I10 Essential (primary) hypertension: Secondary | ICD-10-CM | POA: Diagnosis not present

## 2021-11-06 DIAGNOSIS — E1151 Type 2 diabetes mellitus with diabetic peripheral angiopathy without gangrene: Secondary | ICD-10-CM

## 2021-11-06 DIAGNOSIS — Z96659 Presence of unspecified artificial knee joint: Secondary | ICD-10-CM

## 2021-11-06 HISTORY — PX: KNEE ARTHROPLASTY: SHX992

## 2021-11-06 LAB — GLUCOSE, CAPILLARY
Glucose-Capillary: 128 mg/dL — ABNORMAL HIGH (ref 70–99)
Glucose-Capillary: 128 mg/dL — ABNORMAL HIGH (ref 70–99)
Glucose-Capillary: 155 mg/dL — ABNORMAL HIGH (ref 70–99)
Glucose-Capillary: 275 mg/dL — ABNORMAL HIGH (ref 70–99)
Glucose-Capillary: 277 mg/dL — ABNORMAL HIGH (ref 70–99)

## 2021-11-06 SURGERY — ARTHROPLASTY, KNEE, TOTAL, USING IMAGELESS COMPUTER-ASSISTED NAVIGATION
Anesthesia: General | Site: Knee | Laterality: Right

## 2021-11-06 MED ORDER — ROSUVASTATIN CALCIUM 10 MG PO TABS
10.0000 mg | ORAL_TABLET | Freq: Every day | ORAL | Status: DC
  Administered 2021-11-06 – 2021-11-07 (×2): 10 mg via ORAL
  Filled 2021-11-06 (×2): qty 1

## 2021-11-06 MED ORDER — BUPIVACAINE LIPOSOME 1.3 % IJ SUSP
INTRAMUSCULAR | Status: AC
  Filled 2021-11-06: qty 40

## 2021-11-06 MED ORDER — DIPHENHYDRAMINE HCL 12.5 MG/5ML PO ELIX: 12.5000 mg | ORAL_SOLUTION | ORAL | Status: DC | PRN

## 2021-11-06 MED ORDER — LOPERAMIDE HCL 2 MG PO CAPS: 2.0000 mg | ORAL_CAPSULE | ORAL | Status: DC | PRN

## 2021-11-06 MED ORDER — GLIPIZIDE 5 MG PO TABS
5.0000 mg | ORAL_TABLET | Freq: Every day | ORAL | Status: DC
  Administered 2021-11-07: 5 mg via ORAL
  Filled 2021-11-06: qty 1

## 2021-11-06 MED ORDER — ACETAMINOPHEN 10 MG/ML IV SOLN
INTRAVENOUS | Status: DC | PRN
  Administered 2021-11-06: 1000 mg via INTRAVENOUS

## 2021-11-06 MED ORDER — DEXAMETHASONE SODIUM PHOSPHATE 10 MG/ML IJ SOLN
8.0000 mg | Freq: Once | INTRAMUSCULAR | Status: AC
Start: 2021-11-06 — End: 2021-11-06

## 2021-11-06 MED ORDER — SENNOSIDES-DOCUSATE SODIUM 8.6-50 MG PO TABS
1.0000 | ORAL_TABLET | Freq: Two times a day (BID) | ORAL | Status: DC
  Administered 2021-11-06 – 2021-11-07 (×2): 1 via ORAL
  Filled 2021-11-06 (×2): qty 1

## 2021-11-06 MED ORDER — PROPOFOL 1000 MG/100ML IV EMUL
INTRAVENOUS | Status: AC
  Filled 2021-11-06: qty 100

## 2021-11-06 MED ORDER — FENTANYL CITRATE (PF) 100 MCG/2ML IJ SOLN: 25.0000 ug | INTRAMUSCULAR | Status: DC | PRN

## 2021-11-06 MED ORDER — ONDANSETRON HCL 4 MG/2ML IJ SOLN
INTRAMUSCULAR | Status: AC
  Filled 2021-11-06: qty 2

## 2021-11-06 MED ORDER — BISACODYL 10 MG RE SUPP: 10.0000 mg | Freq: Every day | RECTAL | Status: DC | PRN

## 2021-11-06 MED ORDER — CELECOXIB 200 MG PO CAPS
200.0000 mg | ORAL_CAPSULE | Freq: Two times a day (BID) | ORAL | Status: DC
  Administered 2021-11-06: 200 mg via ORAL
  Filled 2021-11-06 (×3): qty 1

## 2021-11-06 MED ORDER — GABAPENTIN 300 MG PO CAPS
ORAL_CAPSULE | ORAL | Status: AC
  Administered 2021-11-06: 100 mg via ORAL
  Filled 2021-11-06: qty 1

## 2021-11-06 MED ORDER — 0.9 % SODIUM CHLORIDE (POUR BTL) OPTIME
TOPICAL | Status: DC | PRN
  Administered 2021-11-06: 1000 mL

## 2021-11-06 MED ORDER — SODIUM CHLORIDE 0.9 % IV SOLN: INTRAVENOUS | Status: DC

## 2021-11-06 MED ORDER — CEFAZOLIN SODIUM-DEXTROSE 2-4 GM/100ML-% IV SOLN
2.0000 g | Freq: Four times a day (QID) | INTRAVENOUS | Status: AC
  Administered 2021-11-06: 2 g via INTRAVENOUS
  Filled 2021-11-06 (×2): qty 100

## 2021-11-06 MED ORDER — TRANEXAMIC ACID-NACL 1000-0.7 MG/100ML-% IV SOLN
INTRAVENOUS | Status: AC
  Administered 2021-11-06: 1000 mg via INTRAVENOUS
  Filled 2021-11-06: qty 100

## 2021-11-06 MED ORDER — ARTIFICIAL TEARS OPHTHALMIC OINT
TOPICAL_OINTMENT | OPHTHALMIC | Status: AC
  Filled 2021-11-06: qty 3.5

## 2021-11-06 MED ORDER — BUPIVACAINE HCL (PF) 0.25 % IJ SOLN
INTRAMUSCULAR | Status: AC
  Filled 2021-11-06: qty 120

## 2021-11-06 MED ORDER — GABAPENTIN 300 MG PO CAPS
300.0000 mg | ORAL_CAPSULE | Freq: Once | ORAL | Status: AC
  Administered 2021-11-06: 300 mg via ORAL

## 2021-11-06 MED ORDER — TRANEXAMIC ACID-NACL 1000-0.7 MG/100ML-% IV SOLN
INTRAVENOUS | Status: AC
  Filled 2021-11-06: qty 100

## 2021-11-06 MED ORDER — GABAPENTIN 100 MG PO CAPS: 100.0000 mg | ORAL_CAPSULE | ORAL | Status: DC

## 2021-11-06 MED ORDER — ACETAMINOPHEN 10 MG/ML IV SOLN
INTRAVENOUS | Status: AC
  Filled 2021-11-06: qty 100

## 2021-11-06 MED ORDER — CEFAZOLIN SODIUM-DEXTROSE 2-4 GM/100ML-% IV SOLN
2.0000 g | INTRAVENOUS | Status: AC
  Administered 2021-11-06: 2 g via INTRAVENOUS

## 2021-11-06 MED ORDER — INSULIN ASPART 100 UNIT/ML IJ SOLN
0.0000 [IU] | Freq: Every day | INTRAMUSCULAR | Status: DC
  Administered 2021-11-06: 3 [IU] via SUBCUTANEOUS
  Filled 2021-11-06: qty 1

## 2021-11-06 MED ORDER — CELECOXIB 200 MG PO CAPS
400.0000 mg | ORAL_CAPSULE | Freq: Once | ORAL | Status: AC
  Administered 2021-11-06: 400 mg via ORAL

## 2021-11-06 MED ORDER — BUPIVACAINE HCL (PF) 0.5 % IJ SOLN
INTRAMUSCULAR | Status: AC
  Filled 2021-11-06: qty 10

## 2021-11-06 MED ORDER — FAMOTIDINE 20 MG PO TABS
20.0000 mg | ORAL_TABLET | Freq: Once | ORAL | Status: AC
  Administered 2021-11-06: 20 mg via ORAL

## 2021-11-06 MED ORDER — ENOXAPARIN SODIUM 30 MG/0.3ML IJ SOSY
30.0000 mg | PREFILLED_SYRINGE | Freq: Two times a day (BID) | INTRAMUSCULAR | Status: DC
  Administered 2021-11-07: 30 mg via SUBCUTANEOUS
  Filled 2021-11-06: qty 0.3

## 2021-11-06 MED ORDER — FLEET ENEMA 7-19 GM/118ML RE ENEM: 1.0000 | ENEMA | Freq: Once | RECTAL | Status: DC | PRN

## 2021-11-06 MED ORDER — FENTANYL CITRATE (PF) 100 MCG/2ML IJ SOLN
INTRAMUSCULAR | Status: AC
  Filled 2021-11-06: qty 2

## 2021-11-06 MED ORDER — FUROSEMIDE 20 MG PO TABS: 20.0000 mg | ORAL_TABLET | Freq: Every day | ORAL | Status: DC | PRN

## 2021-11-06 MED ORDER — CHLORHEXIDINE GLUCONATE 0.12 % MT SOLN: 15.0000 mL | Freq: Once | OROMUCOSAL | Status: AC

## 2021-11-06 MED ORDER — ONDANSETRON HCL 4 MG PO TABS: 4.0000 mg | ORAL_TABLET | Freq: Four times a day (QID) | ORAL | Status: DC | PRN

## 2021-11-06 MED ORDER — SURGIPHOR WOUND IRRIGATION SYSTEM - OPTIME
TOPICAL | Status: DC | PRN
  Administered 2021-11-06: 1

## 2021-11-06 MED ORDER — ONDANSETRON HCL 4 MG/2ML IJ SOLN
INTRAMUSCULAR | Status: DC | PRN
  Administered 2021-11-06: 4 mg via INTRAVENOUS

## 2021-11-06 MED ORDER — ONDANSETRON HCL 4 MG/2ML IJ SOLN: 4.0000 mg | Freq: Four times a day (QID) | INTRAMUSCULAR | Status: DC | PRN

## 2021-11-06 MED ORDER — GABAPENTIN 100 MG PO CAPS
200.0000 mg | ORAL_CAPSULE | Freq: Every morning | ORAL | Status: DC
  Administered 2021-11-07: 200 mg via ORAL
  Filled 2021-11-06: qty 2

## 2021-11-06 MED ORDER — HYDROMORPHONE HCL 1 MG/ML IJ SOLN: 0.5000 mg | INTRAMUSCULAR | Status: DC | PRN

## 2021-11-06 MED ORDER — FAMOTIDINE 20 MG PO TABS
ORAL_TABLET | ORAL | Status: AC
  Filled 2021-11-06: qty 1

## 2021-11-06 MED ORDER — ALPRAZOLAM 0.5 MG PO TABS: 0.5000 mg | ORAL_TABLET | Freq: Two times a day (BID) | ORAL | Status: DC | PRN

## 2021-11-06 MED ORDER — ACETAMINOPHEN 325 MG PO TABS: 325.0000 mg | ORAL_TABLET | Freq: Four times a day (QID) | ORAL | Status: DC | PRN

## 2021-11-06 MED ORDER — SODIUM CHLORIDE FLUSH 0.9 % IV SOLN
INTRAVENOUS | Status: AC
  Filled 2021-11-06: qty 60

## 2021-11-06 MED ORDER — MIDAZOLAM HCL 2 MG/2ML IJ SOLN
INTRAMUSCULAR | Status: AC
  Filled 2021-11-06: qty 2

## 2021-11-06 MED ORDER — ACETAMINOPHEN 10 MG/ML IV SOLN
1000.0000 mg | Freq: Four times a day (QID) | INTRAVENOUS | Status: DC
  Administered 2021-11-06 – 2021-11-07 (×3): 1000 mg via INTRAVENOUS
  Filled 2021-11-06 (×4): qty 100

## 2021-11-06 MED ORDER — METOCLOPRAMIDE HCL 5 MG PO TABS
10.0000 mg | ORAL_TABLET | Freq: Three times a day (TID) | ORAL | Status: DC
  Administered 2021-11-06 – 2021-11-07 (×3): 10 mg via ORAL
  Filled 2021-11-06 (×3): qty 2

## 2021-11-06 MED ORDER — GABAPENTIN 100 MG PO CAPS
100.0000 mg | ORAL_CAPSULE | Freq: Every evening | ORAL | Status: DC
  Filled 2021-11-06: qty 1

## 2021-11-06 MED ORDER — PANTOPRAZOLE SODIUM 40 MG PO TBEC
40.0000 mg | DELAYED_RELEASE_TABLET | Freq: Two times a day (BID) | ORAL | Status: DC
  Administered 2021-11-06 – 2021-11-07 (×2): 40 mg via ORAL
  Filled 2021-11-06 (×2): qty 1

## 2021-11-06 MED ORDER — ONDANSETRON HCL 4 MG/2ML IJ SOLN
4.0000 mg | Freq: Once | INTRAMUSCULAR | Status: AC | PRN
  Administered 2021-11-06: 4 mg via INTRAVENOUS

## 2021-11-06 MED ORDER — MIDAZOLAM HCL 5 MG/5ML IJ SOLN
INTRAMUSCULAR | Status: DC | PRN
  Administered 2021-11-06: 1 mg via INTRAVENOUS

## 2021-11-06 MED ORDER — CHLORHEXIDINE GLUCONATE 4 % EX LIQD: 60.0000 mL | Freq: Once | CUTANEOUS | Status: DC

## 2021-11-06 MED ORDER — TRANEXAMIC ACID-NACL 1000-0.7 MG/100ML-% IV SOLN
1000.0000 mg | INTRAVENOUS | Status: AC
  Administered 2021-11-06: 1000 mg via INTRAVENOUS

## 2021-11-06 MED ORDER — METOPROLOL TARTRATE 50 MG PO TABS
100.0000 mg | ORAL_TABLET | Freq: Every day | ORAL | Status: DC
  Administered 2021-11-07: 100 mg via ORAL
  Filled 2021-11-06: qty 2

## 2021-11-06 MED ORDER — OXYCODONE HCL 5 MG PO TABS
5.0000 mg | ORAL_TABLET | ORAL | Status: DC | PRN
  Administered 2021-11-06 – 2021-11-07 (×2): 5 mg via ORAL
  Filled 2021-11-06: qty 1

## 2021-11-06 MED ORDER — PROPOFOL 500 MG/50ML IV EMUL
INTRAVENOUS | Status: DC | PRN
  Administered 2021-11-06: 100 ug/kg/min via INTRAVENOUS

## 2021-11-06 MED ORDER — PHENYLEPHRINE HCL-NACL 20-0.9 MG/250ML-% IV SOLN
INTRAVENOUS | Status: AC
  Filled 2021-11-06: qty 250

## 2021-11-06 MED ORDER — BUPIVACAINE HCL (PF) 0.5 % IJ SOLN
INTRAMUSCULAR | Status: DC | PRN
  Administered 2021-11-06: 2.5 mL

## 2021-11-06 MED ORDER — SODIUM CHLORIDE (PF) 0.9 % IJ SOLN
INTRAMUSCULAR | Status: DC | PRN
  Administered 2021-11-06: 120 mL via INTRAMUSCULAR

## 2021-11-06 MED ORDER — ALLOPURINOL 100 MG PO TABS
100.0000 mg | ORAL_TABLET | Freq: Every day | ORAL | Status: DC
  Administered 2021-11-07: 100 mg via ORAL
  Filled 2021-11-06: qty 1

## 2021-11-06 MED ORDER — PHENOL 1.4 % MT LIQD: 1.0000 | OROMUCOSAL | Status: DC | PRN

## 2021-11-06 MED ORDER — ALUM & MAG HYDROXIDE-SIMETH 200-200-20 MG/5ML PO SUSP: 30.0000 mL | ORAL | Status: DC | PRN

## 2021-11-06 MED ORDER — TRANEXAMIC ACID-NACL 1000-0.7 MG/100ML-% IV SOLN: 1000.0000 mg | Freq: Once | INTRAVENOUS | Status: AC

## 2021-11-06 MED ORDER — AMLODIPINE BESYLATE 10 MG PO TABS
10.0000 mg | ORAL_TABLET | Freq: Every day | ORAL | Status: DC
  Administered 2021-11-07: 10 mg via ORAL
  Filled 2021-11-06: qty 1

## 2021-11-06 MED ORDER — SODIUM CHLORIDE 0.9 % IV SOLN
INTRAVENOUS | Status: DC
Start: 2021-11-06 — End: 2021-11-07

## 2021-11-06 MED ORDER — CEFAZOLIN SODIUM-DEXTROSE 2-4 GM/100ML-% IV SOLN
INTRAVENOUS | Status: AC
  Administered 2021-11-06: 2 g via INTRAVENOUS
  Filled 2021-11-06: qty 100

## 2021-11-06 MED ORDER — DEXAMETHASONE SODIUM PHOSPHATE 10 MG/ML IJ SOLN
INTRAMUSCULAR | Status: AC
  Administered 2021-11-06: 8 mg via INTRAVENOUS
  Filled 2021-11-06: qty 1

## 2021-11-06 MED ORDER — MENTHOL 3 MG MT LOZG: 1.0000 | LOZENGE | OROMUCOSAL | Status: DC | PRN

## 2021-11-06 MED ORDER — INSULIN ASPART 100 UNIT/ML IJ SOLN
0.0000 [IU] | Freq: Three times a day (TID) | INTRAMUSCULAR | Status: DC
  Administered 2021-11-06: 8 [IU] via SUBCUTANEOUS
  Administered 2021-11-07: 5 [IU] via SUBCUTANEOUS
  Filled 2021-11-06 (×2): qty 1

## 2021-11-06 MED ORDER — METFORMIN HCL 500 MG PO TABS
500.0000 mg | ORAL_TABLET | Freq: Three times a day (TID) | ORAL | Status: DC
  Administered 2021-11-06 – 2021-11-07 (×2): 500 mg via ORAL
  Filled 2021-11-06 (×3): qty 1

## 2021-11-06 MED ORDER — MAGNESIUM HYDROXIDE 400 MG/5ML PO SUSP
30.0000 mL | Freq: Every day | ORAL | Status: DC
  Administered 2021-11-07: 30 mL via ORAL
  Filled 2021-11-06 (×2): qty 30

## 2021-11-06 MED ORDER — CHLORHEXIDINE GLUCONATE 0.12 % MT SOLN
OROMUCOSAL | Status: AC
  Administered 2021-11-06: 15 mL
  Filled 2021-11-06: qty 15

## 2021-11-06 MED ORDER — ORAL CARE MOUTH RINSE: 15.0000 mL | Freq: Once | OROMUCOSAL | Status: AC

## 2021-11-06 MED ORDER — VITAMIN D3 25 MCG (1000 UNIT) PO TABS
50.0000 ug | ORAL_TABLET | Freq: Every day | ORAL | Status: DC
  Administered 2021-11-07: 2000 [IU] via ORAL
  Filled 2021-11-06 (×2): qty 2

## 2021-11-06 MED ORDER — SODIUM CHLORIDE 0.9 % IR SOLN
Status: DC | PRN
  Administered 2021-11-06: 3000 mL

## 2021-11-06 MED ORDER — FERROUS SULFATE 325 (65 FE) MG PO TABS
325.0000 mg | ORAL_TABLET | Freq: Two times a day (BID) | ORAL | Status: DC
  Administered 2021-11-06 – 2021-11-07 (×2): 325 mg via ORAL
  Filled 2021-11-06 (×2): qty 1

## 2021-11-06 MED ORDER — FENTANYL CITRATE (PF) 100 MCG/2ML IJ SOLN
INTRAMUSCULAR | Status: DC | PRN
Start: 2021-11-06 — End: 2021-11-06
  Administered 2021-11-06: 50 ug via INTRAVENOUS

## 2021-11-06 MED ORDER — PHENYLEPHRINE HCL-NACL 20-0.9 MG/250ML-% IV SOLN
INTRAVENOUS | Status: DC | PRN
  Administered 2021-11-06: 20 ug/min via INTRAVENOUS

## 2021-11-06 MED ORDER — CYANOCOBALAMIN 500 MCG PO TABS
500.0000 ug | ORAL_TABLET | Freq: Every day | ORAL | Status: DC
  Administered 2021-11-07: 500 ug via ORAL
  Filled 2021-11-06: qty 1

## 2021-11-06 MED ORDER — TRAMADOL HCL 50 MG PO TABS: 50.0000 mg | ORAL_TABLET | ORAL | Status: DC | PRN

## 2021-11-06 MED ORDER — OXYCODONE HCL 5 MG PO TABS
10.0000 mg | ORAL_TABLET | ORAL | Status: DC | PRN
  Filled 2021-11-06: qty 2

## 2021-11-06 MED ORDER — CELECOXIB 200 MG PO CAPS
ORAL_CAPSULE | ORAL | Status: AC
  Administered 2021-11-07: 200 mg via ORAL
  Filled 2021-11-06: qty 2

## 2021-11-06 SURGICAL SUPPLY — 75 items
ATTUNE PSFEM RTSZ5 NARCEM KNEE (Femur) ×1 IMPLANT
ATTUNE PSRP INSR SZ5 8 KNEE (Insert) ×1 IMPLANT
BASEPLATE TIBIAL ROTATING SZ 4 (Knees) ×1 IMPLANT
BATTERY INSTRU NAVIGATION (MISCELLANEOUS) ×8 IMPLANT
BLADE CLIPPER SURG (BLADE) ×1 IMPLANT
BLADE SAW 70X12.5 (BLADE) ×2 IMPLANT
BLADE SAW 90X13X1.19 OSCILLAT (BLADE) ×2 IMPLANT
BLADE SAW 90X25X1.19 OSCILLAT (BLADE) ×2 IMPLANT
BONE CEMENT GENTAMICIN (Cement) ×4 IMPLANT
CEMENT BONE GENTAMICIN 40 (Cement) IMPLANT
CEMENT HV SMART SET (Cement) IMPLANT
COOLER POLAR GLACIER W/PUMP (MISCELLANEOUS) ×2 IMPLANT
CUFF TOURN SGL QUICK 24 (TOURNIQUET CUFF)
CUFF TOURN SGL QUICK 34 (TOURNIQUET CUFF)
CUFF TRNQT CYL 24X4X16.5-23 (TOURNIQUET CUFF) IMPLANT
CUFF TRNQT CYL 34X4.125X (TOURNIQUET CUFF) IMPLANT
DRAPE 3/4 80X56 (DRAPES) ×2 IMPLANT
DRAPE INCISE IOBAN 66X45 STRL (DRAPES) ×1 IMPLANT
DRSG DERMACEA NONADH 3X8 (GAUZE/BANDAGES/DRESSINGS) ×1 IMPLANT
DRSG MEPILEX SACRM 8.7X9.8 (GAUZE/BANDAGES/DRESSINGS) ×2 IMPLANT
DRSG OPSITE POSTOP 4X14 (GAUZE/BANDAGES/DRESSINGS) ×2 IMPLANT
DRSG TEGADERM 4X4.75 (GAUZE/BANDAGES/DRESSINGS) ×1 IMPLANT
DURAPREP 26ML APPLICATOR (WOUND CARE) ×4 IMPLANT
ELECT CAUTERY BLADE 6.4 (BLADE) ×2 IMPLANT
ELECT REM PT RETURN 9FT ADLT (ELECTROSURGICAL) ×2
ELECTRODE REM PT RTRN 9FT ADLT (ELECTROSURGICAL) ×1 IMPLANT
EX-PIN ORTHOLOCK NAV 4X150 (PIN) ×4 IMPLANT
GLOVE BIOGEL M STRL SZ7.5 (GLOVE) ×8 IMPLANT
GLOVE BIOGEL PI IND STRL 8 (GLOVE) ×1 IMPLANT
GLOVE BIOGEL PI INDICATOR 8 (GLOVE) ×1
GLOVE SURG UNDER POLY LF SZ7.5 (GLOVE) ×2 IMPLANT
GOWN STRL REUS W/ TWL LRG LVL3 (GOWN DISPOSABLE) ×2 IMPLANT
GOWN STRL REUS W/ TWL XL LVL3 (GOWN DISPOSABLE) ×1 IMPLANT
GOWN STRL REUS W/TWL LRG LVL3 (GOWN DISPOSABLE) ×4
GOWN STRL REUS W/TWL XL LVL3 (GOWN DISPOSABLE) ×2
HEMOVAC 400CC 10FR (MISCELLANEOUS) ×1 IMPLANT
HOLDER FOLEY CATH W/STRAP (MISCELLANEOUS) ×2 IMPLANT
HOLSTER ELECTROSUGICAL PENCIL (MISCELLANEOUS) ×1 IMPLANT
HOOD PEEL AWAY FLYTE STAYCOOL (MISCELLANEOUS) ×4 IMPLANT
IV NS IRRIG 3000ML ARTHROMATIC (IV SOLUTION) ×2 IMPLANT
KIT PREVENA INCISION MGT20CM45 (CANNISTER) ×1 IMPLANT
KIT TURNOVER KIT A (KITS) ×2 IMPLANT
KNIFE SCULPS 14X20 (INSTRUMENTS) ×2 IMPLANT
MANIFOLD NEPTUNE II (INSTRUMENTS) ×4 IMPLANT
NDL SPNL 20GX3.5 QUINCKE YW (NEEDLE) ×2 IMPLANT
NEEDLE SPNL 20GX3.5 QUINCKE YW (NEEDLE) ×4 IMPLANT
NS IRRIG 500ML POUR BTL (IV SOLUTION) ×2 IMPLANT
PACK TOTAL KNEE (MISCELLANEOUS) ×2 IMPLANT
PAD ABD DERMACEA PRESS 5X9 (GAUZE/BANDAGES/DRESSINGS) ×2 IMPLANT
PAD WRAPON POLAR KNEE (MISCELLANEOUS) ×1 IMPLANT
PATELLA MEDIAL ATTUN 35MM KNEE (Knees) ×1 IMPLANT
PIN DRILL FIX HALF THREAD (BIT) ×4 IMPLANT
PIN DRILL QUICK PACK ×2 IMPLANT
PIN FIXATION 1/8DIA X 3INL (PIN) ×2 IMPLANT
PULSAVAC PLUS IRRIG FAN TIP (DISPOSABLE) ×2
SOL PREP PVP 2OZ (MISCELLANEOUS) ×2
SOLUTION IRRIG SURGIPHOR (IV SOLUTION) ×2 IMPLANT
SOLUTION PREP PVP 2OZ (MISCELLANEOUS) ×1 IMPLANT
SPONGE DRAIN TRACH 4X4 STRL 2S (GAUZE/BANDAGES/DRESSINGS) ×1 IMPLANT
STAPLER SKIN PROX 35W (STAPLE) ×2 IMPLANT
STOCKINETTE IMPERV 14X48 (MISCELLANEOUS) ×1 IMPLANT
STRAP TIBIA SHORT (MISCELLANEOUS) ×2 IMPLANT
SUCTION FRAZIER HANDLE 10FR (MISCELLANEOUS) ×2
SUCTION TUBE FRAZIER 10FR DISP (MISCELLANEOUS) ×1 IMPLANT
SUT VIC AB 0 CT1 36 (SUTURE) ×4 IMPLANT
SUT VIC AB 1 CT1 36 (SUTURE) ×4 IMPLANT
SUT VIC AB 2-0 CT2 27 (SUTURE) ×2 IMPLANT
SYR 30ML LL (SYRINGE) ×4 IMPLANT
TIP FAN IRRIG PULSAVAC PLUS (DISPOSABLE) ×1 IMPLANT
TOWEL OR 17X26 4PK STRL BLUE (TOWEL DISPOSABLE) IMPLANT
TOWER CARTRIDGE SMART MIX (DISPOSABLE) ×2 IMPLANT
TRAY FOLEY MTR SLVR 16FR STAT (SET/KITS/TRAYS/PACK) ×2 IMPLANT
WATER STERILE IRR 1000ML POUR (IV SOLUTION) ×1 IMPLANT
WATER STERILE IRR 500ML POUR (IV SOLUTION) ×1 IMPLANT
WRAPON POLAR PAD KNEE (MISCELLANEOUS) ×2

## 2021-11-06 NOTE — H&P (Signed)
ORTHOPAEDIC HISTORY & PHYSICAL [name], [place] - 10/29/2021 10:45 AM EDT Formatting of this note is different from the original. [name] MEDICINE Chief Complaint:   Chief Complaint  Patient presents with  Knee Pain  H & P RIGHT KNEE   History of Present Illness:   [name] is a 76 y.o. female that presents to clinic today for her preoperative history and evaluation. The patient is scheduled to undergo a right total knee arthroplasty on 11/06/21 by Dr. Marry Guan. Her pain began several years ago. The pain is located primarily along the medial aspect of the knee. She describes her pain as worse with weightbearing. She reports associated swelling with significant giving way of the knee. She denies associated numbness or tingling, denies locking.   The patient's symptoms have progressed to the point that they decrease her quality of life. The patient has previously undergone conservative treatment including NSAIDS and injections to the knee without adequate control of her symptoms.  Of note, patient was recently seen in ED for flank pain, is being treated for UTI currently with cefdinir.   Denies significant cardiac history, denies history of DVT. Has previously had lumbar surgery with Dr Arnoldo Morale but denies any retained hardware.   Patient states her husband will be at home post-operatively. She has a daughter in the area who is a teach and may be able to stay the first week post-operatively.   Last A1C was 6.7 on 10/23/21.   Past Medical, Surgical, Family, Social History, Allergies, Medications:   Past Medical History:  Past Medical History:  Diagnosis Date  Diabetes mellitus type 2, uncomplicated (CMS-HCC)  Diet Controlled, No longer on Metformin  H/O adenomatous polyp of colon 01/02/2015  H/O urinary incontinence 12/30/2014  Hyperlipidemia  Hypertension  Osteoarthritis  Recurrent nephrolithiasis  followed by Dr. Jacqlyn Larsen  Sleep  apnea  HX of Sleep Apnea, no longer indicated due to weight loss  Stroke (CMS-HCC)  hx of CVA with mild residual right hemiparesis   Past Surgical History:  Past Surgical History:  Procedure Laterality Date  COLONOSCOPY 07/20/2000  Adenomatous Polyp  Left total knee arthroplasty 2004  Dr. Ronnie Derby  COLONOSCOPY 01/09/2015  [facility] Adenomatous Polyp: CBF 01/2020  BACK SURGERY 08/2016  Right reverse total shoulder arthroplasty, biceps tendoesis Right 12/08/2020  Dr. Posey Pronto  Hx of lithotripsy and right kidney surgery secondary to stones  Hx of thyroid/goiter surgery  WISDOM TEETH   Current Medications:  Current Outpatient Medications  Medication Sig Dispense Refill  acetaminophen (TYLENOL) 500 MG tablet Take 1,000 mg by mouth every 6 (six) hours as needed for Pain  allopurinoL (ZYLOPRIM) 100 MG tablet TAKE 1 TABLET EVERY DAY 90 tablet 3  ALPRAZolam (XANAX) 0.25 MG tablet Take 1 tablet (0.25 mg total) by mouth once daily as needed 30 tablet 0  amLODIPine (NORVASC) 10 MG tablet TAKE 1 TABLET EVERY DAY 90 tablet 1  aspirin 81 MG EC tablet Take 81 mg by mouth once daily  cefdinir (OMNICEF) 300 mg capsule Take 300 mg by mouth 2 (two) times daily FOR 7 DAYS  chlorpheniramine-dextromethorp 4-30 mg Take 1 capsule by mouth once daily as needed  cholecalciferol (VITAMIN D3) 1000 unit tablet Take 1,000 Units by mouth once daily Per patient  Compound Medication Take 1 capsule by mouth once daily as needed  cyanocobalamin, vitamin B-12, 500 mcg Lozg Take by mouth once daily.  FUROsemide (LASIX) 20 MG tablet Take 1 tablet (20 mg total) by mouth once  daily as needed for Edema 90 tablet 3  gabapentin (NEURONTIN) 300 MG capsule 1 tab in the morning, 2 tabs at bedtime (Patient taking differently: Take 300 mg by mouth as directed 1 cap in the morning, 2 caps at bedtime) 270 capsule 3  glipiZIDE (GLUCOTROL) 10 MG tablet TAKE 1 TABLET (10 MG TOTAL) BY MOUTH EVERY MORNING BEFORE BREAKFAST 90 tablet 3   HYDROcodone-acetaminophen (NORCO) 5-325 mg tablet Take 1 tablet by mouth every 6 (six) hours as needed for Pain 50 tablet 0  loperamide (IMODIUM) 2 mg capsule Take 2 mg by mouth as needed FOR DIARRHEA  melatonin 5 mg Cap Take 1 tablet by mouth at bedtime as needed  metFORMIN (GLUCOPHAGE) 500 MG tablet TAKE 1 TABLET THREE TIMES DAILY 270 tablet 3  metoprolol tartrate (LOPRESSOR) 100 MG tablet TAKE 1 TABLET EVERY DAY 90 tablet 3  rosuvastatin (CRESTOR) 10 MG tablet TAKE 1 TABLET EVERY DAY 90 tablet 3  semaglutide (OZEMPIC) 0.25 mg or 0.5 mg (2 mg/3 mL) pen injector Inject 0.75 mLs (0.5 mg total) subcutaneously once a week for 90 days 9 mL 3  semaglutide (OZEMPIC) 0.25 mg or 0.5 mg(2 mg/1.5 mL) pen injector Inject 0.1875 mLs (0.25 mg total) subcutaneously once a week for 30 days 0.75 mL 11   No current facility-administered medications for this visit.   Allergies: No Known Allergies  Social History:  Social History   Socioeconomic History  Marital status: Married  Spouse name: Alvis  Number of children: 2  Years of education: 12  Highest education level: High school graduate  Occupational History  Occupation: RetiredClinical cytogeneticist x 40 years  Tobacco Use  Smoking status: Never  Smokeless tobacco: Never  Vaping Use  Vaping Use: Never used  Substance and Sexual Activity  Alcohol use: Never  Comment: OCCASIONAL  Drug use: Defer  Sexual activity: Not Currently  Partners: Male  Social History Narrative  Lives in town with spouse.   Family History:  Family History  Problem Relation Age of Onset  Stroke Mother  Coronary Artery Disease (Blocked arteries around heart) Father  Heart disease Brother   Review of Systems:   A 10+ ROS was performed, reviewed, and the pertinent orthopaedic findings are documented in the HPI.   Physical Examination:   BP 130/76 (BP Location: Left upper arm, Patient Position: Sitting, BP Cuff Size: Adult)  Ht 162.6 cm ('5\' 4"'$ )  Wt 95.3 kg (210  lb 3.2 oz)  BMI 36.08 kg/m   Patient is a well-developed, well-nourished female in no acute distress. Patient has normal mood and affect. Patient is alert and oriented to person, place, and time.   HEENT: Atraumatic, normocephalic. Pupils equal and reactive to light. Extraocular motion intact. Noninjected sclera.  Cardiovascular: Regular rate and rhythm, with no murmurs, rubs, or gallops. Distal pulses palpable.  Respiratory: Lungs clear to auscultation bilaterally.   Right Knee: Soft tissue swelling: mild Effusion: none Erythema: none Crepitance: mild Tenderness: medial Alignment: relative varus Mediolateral laxity: medial pseudolaxity Posterior sag: negative Patellar tracking: Good tracking without evidence of subluxation or tilt Atrophy: No significant atrophy.  Quadriceps tone was fair to good. Range of motion: 0/4/105 degrees  Patient able to dorsiflex and plantarflex the right ankle. Able to flex and extend the toes.  Sensation intact over the saphenous, lateral sural cutaneous, superficial fibular, and deep fibular nerve distributions.  Tests Performed/Reviewed:  X-rays  3 views of the right knee were reviewed. Images reveal severe loss of medial compartment joint space with significant  osteophyte formation. No fractures or dislocations. No osseous abnormality noted.  Impression:   ICD-10-CM  1. Primary osteoarthritis of right knee M17.11   Plan:   The patient has end-stage degenerative changes of the right knee. It was explained to the patient that the condition is progressive in nature. Having failed conservative treatment, the patient has elected to proceed with a total joint arthroplasty. The patient will undergo a total joint arthroplasty with Dr. Marry Guan. The risks of surgery, including blood clot and infection, were discussed with the patient. Measures to reduce these risks, including the use of anticoagulation, perioperative antibiotics, and early ambulation  were discussed. The importance of postoperative physical therapy was discussed with the patient. The patient elects to proceed with surgery. The patient is instructed to stop all blood thinners prior to surgery. The patient is instructed to call the hospital the day before surgery to learn of the proper arrival time.   Contact our office with any questions or concerns. Follow up as indicated, or sooner should any new problems arise, if conditions worsen, or if they are otherwise concerned.   [name], [facility] [place], [place] [address] Phone: [phone]  This note was generated in part with voice recognition software and I apologize for any typographical errors that were not detected and corrected.  Electronically signed by Kelsey Fudge, PA at 10/29/2021 6:03 PM EDT

## 2021-11-06 NOTE — Evaluation (Signed)
Physical Therapy Evaluation Patient Details Name: [name] MRN: [id number] DOB: 08-Apr-1946 Today's Date: 11/06/2021  History of Present Illness  admitted for acute hospitalization s/p R TKA, WBAT (11/06/21)  Clinical Impression  Patient resting in bed upon arrival to room; easily awakens to therapist voice.  Son at bedside (swapped with daughter mid-session).  Patient pain well-controlled (FACES 3-4/10); meds received prior to session.  Alert and oriented to basic information; follows commands and agreeable to session.  Eager for [place] activities, hopeful for transfer to [facility] to empty bladder.  Demonstrates good post-op R LE strength (3-/5) and ROM (0-80 degrees); good quad activation and control with isolated therex.  Able to complete bed mobility with min assist; sit/stand, basic transfers and bed/chair, bed/BSC transfer with RW, min assist.  Fair/good WBing R LE; consistent cuing, consistent encouragement throughout.  Additional gait deferred due to toileting needs; do anticipate consistent progression towards all mobility goals in subsequent sessions. Would benefit from skilled PT to address above deficits and promote optimal return to PLOF.; Recommend transition to HHPT upon discharge from acute hospitalization.      Recommendations for follow up therapy are one component of a multi-disciplinary discharge planning process, led by the attending physician.  Recommendations may be updated based on patient status, additional functional criteria and insurance authorization.  Follow Up Recommendations Home health PT      Assistance Recommended at Discharge Intermittent Supervision/Assistance  Patient can return home with the following  A little help with walking and/or transfers;A little help with bathing/dressing/bathroom    Equipment Recommendations Rolling walker (2 wheels)  Recommendations for Other Services       Functional Status Assessment Patient has had a recent decline in  their functional status and demonstrates the ability to make significant improvements in function in a reasonable and predictable amount of time.     Precautions / Restrictions Precautions Precautions: Fall Restrictions Weight Bearing Restrictions: Yes RLE Weight Bearing: Weight bearing as tolerated      Mobility  Bed Mobility Overal bed mobility: Needs Assistance Bed Mobility: Supine to Sit     Supine to sit: Min assist          Transfers Overall transfer level: Needs assistance Equipment used: Rolling walker (2 wheels) Transfers: Sit to/from Stand, Bed to chair/wheelchair/BSC Sit to Stand: Min assist Stand pivot transfers: Min assist         General transfer comment: cuing for hand placement; mod use of momentum with initial stand    Ambulation/Gait               General Gait Details: deferred this session  Stairs            Wheelchair Mobility    Modified Rankin (Stroke Patients Only)       Balance Overall balance assessment: Needs assistance Sitting-balance support: No upper extremity supported, Feet supported Sitting balance-Leahy Scale: Good     Standing balance support: Bilateral upper extremity supported Standing balance-Leahy Scale: Fair                               Pertinent Vitals/Pain Pain Assessment Pain Assessment: Faces Faces Pain Scale: Hurts little more Pain Location: R LE Pain Descriptors / Indicators: Aching, Grimacing, Guarding Pain Intervention(s): Limited activity within patient's tolerance, Monitored during session, Repositioned, Premedicated before session    [place] expects to be discharged to:: Private residence Living Arrangements: Spouse/significant other Available Help at Discharge:  Family;Available PRN/intermittently Type of Home: House Home Access: Stairs to enter Entrance Stairs-Rails: Right Entrance Stairs-Number of Steps: 3-4   Home Layout: One level Home  Equipment: Rollator (4 wheels);BSC/3in1 Additional Comments: Pt sleeps in lift chair    Prior Function Prior Level of Function : Independent/Modified Independent             Mobility Comments: Mod indep with 4WRW for ADLs, household and community mobilization       Hand Dominance   Dominant Hand: Right    Extremity/Trunk Assessment   Upper Extremity Assessment Upper Extremity Assessment: Overall WFL for tasks assessed    Lower Extremity Assessment Lower Extremity Assessment:  (R knee grossly 3-/5, ROM 0-80 degrees (limited by pain))       Communication   Communication: No difficulties  Cognition Arousal/Alertness: Awake/alert Behavior During Therapy: WFL for tasks assessed/performed Overall Cognitive Status: Within Functional Limits for tasks assessed                                          General Comments      Exercises Other Exercises Other Exercises: Supine R LE therex, 1x10, active ROM: ankle pumps, quad sets, SAQs, heel slides, hip abduct/adduct and SLR.  Good R quad activation and control with isolated LE therex Other Exercises: Toilet transfer, ambulatory with RW, cga/min assist; min cuing for walker management.  Good WBing/stability in R LE   Assessment/Plan    PT Assessment Patient needs continued PT services  PT Problem List Decreased strength;Decreased activity tolerance;Decreased balance;Decreased range of motion;Decreased mobility;Decreased knowledge of use of DME;Decreased knowledge of precautions;Pain;Decreased safety awareness       PT Treatment Interventions DME instruction;Gait training;Stair training;Functional mobility training;Therapeutic activities;Therapeutic exercise;Balance training;Patient/family education    PT Goals (Current goals can be found in the Care Plan section)  Acute Rehab PT Goals Patient Stated Goal: to return home PT Goal Formulation: With patient/family Time For Goal Achievement: 11/20/21 Potential  to Achieve Goals: Good    Frequency BID     Co-evaluation               AM-PAC PT "6 Clicks" Mobility  Outcome Measure Help needed turning from your back to your side while in a flat bed without using bedrails?: None Help needed moving from lying on your back to sitting on the side of a flat bed without using bedrails?: A Little Help needed moving to and from a bed to a chair (including a wheelchair)?: A Little Help needed standing up from a chair using your arms (e.g., wheelchair or bedside chair)?: A Little Help needed to walk in hospital room?: A Little Help needed climbing 3-5 steps with a railing? : A Lot 6 Click Score: 18    End of Session Equipment Utilized During Treatment: Gait belt Activity Tolerance: Patient tolerated treatment well Patient left: in chair;with call bell/phone within reach;with chair alarm set;with family/visitor present Nurse Communication: Mobility status PT Visit Diagnosis: Other abnormalities of gait and mobility (R26.89);Difficulty in walking, not elsewhere classified (R26.2);Pain Pain - Right/Left: Right Pain - part of body: Knee    Time: 8527-7824 PT Time Calculation (min) (ACUTE ONLY): 44 min   Charges:   PT Evaluation $PT Eval Moderate Complexity: 1 Mod PT Treatments $Therapeutic Activity: 8-22 mins        Jahmya Onofrio H. Owens Shark, PT, DPT, NCS 11/06/21, 5:26 PM [phone]

## 2021-11-06 NOTE — Plan of Care (Signed)
  Problem: Activity: Goal: Risk for activity intolerance will decrease Outcome: Progressing   Problem: Safety: Goal: Ability to remain free from injury will improve Outcome: Progressing   

## 2021-11-06 NOTE — Transfer of Care (Signed)
Immediate Anesthesia Transfer of Care Note  Patient: [name]  Procedure(s) Performed: COMPUTER ASSISTED TOTAL KNEE ARTHROPLASTY - DEPUY (Right: Knee)  Patient Location: PACU  Anesthesia Type:Spinal  Level of Consciousness: drowsy  Airway & Oxygen Therapy: Patient Spontanous Breathing and Patient connected to nasal cannula oxygen  Post-op Assessment: Report given to RN and Post -op Vital signs reviewed and stable  Post vital signs: Reviewed and stable  Last Vitals:  Vitals Value Taken Time  BP 111/59 11/06/21 1130  Temp    Pulse 63 11/06/21 1134  Resp 16 11/06/21 1134  SpO2 91 % 11/06/21 1134  Vitals shown include unvalidated device data.  Last Pain:  Vitals:   11/06/21 0637  TempSrc: Oral         Complications: No notable events documented.

## 2021-11-06 NOTE — Anesthesia Preprocedure Evaluation (Signed)
Anesthesia Evaluation  Patient identified by MRN, date of birth, ID band Patient awake    Reviewed: Allergy & Precautions, H&P , NPO status , Patient's Chart, lab work & pertinent test results, reviewed documented beta blocker date and time   Airway Mallampati: II   Neck ROM: full    Dental  (+) Poor Dentition   Pulmonary neg pulmonary ROS,    Pulmonary exam normal        Cardiovascular hypertension, negative cardio ROS Normal cardiovascular exam Rhythm:regular Rate:Normal     Neuro/Psych negative neurological ROS  negative psych ROS   GI/Hepatic negative GI ROS, Neg liver ROS,   Endo/Other  negative endocrine ROSdiabetes  Renal/GU negative Renal ROS  negative genitourinary   Musculoskeletal   Abdominal   Peds  Hematology negative hematology ROS (+)   Anesthesia Other Findings Past Medical History: No date: A-fib [facility])     Comment:  back in 2013, for stent placement in kidney, she had a               bout of a-fib.  Now, back in rhythm No date: Arthritis No date: Chronic kidney disease No date: Complication of anesthesia No date: Diabetes mellitus without complication (HCC)     Comment:  type 2    only takes metformin No date: Dyspnea No date: Dysuria No date: History of kidney stones No date: Hypertension No date: Osteopenia No date: PONV (postoperative nausea and vomiting) No date: Sleep apnea     Comment:  lost over 120 lbs, and no long uses it (close to 2 yrs               now) No date: Stroke [facility])     Comment:  mini 2003--affected right side of body.   took 6 weeks               to get over. No date: Urine, incontinence, stress female Past Surgical History: 11/22/2019: BREAST BIOPSY; Right     Comment:  U/S Bx-HYDROMARK-BENIGN INTRAMAMMARY LYMPH NODE. No date: COLONOSCOPY W/ POLYPECTOMY; Right 01/09/2015: COLONOSCOPY WITH PROPOFOL; N/A     Comment:  Procedure: COLONOSCOPY WITH PROPOFOL;   Surgeon: Manya Silvas, MD;  Location: [facility] ENDOSCOPY;  Service:               Endoscopy;  Laterality: N/A; 03/15/2019: ERCP; N/A     Comment:  Procedure: ENDOSCOPIC RETROGRADE               CHOLANGIOPANCREATOGRAPHY (ERCP);  Surgeon: Lucilla Lame,               MD;  Location: [facility] ENDOSCOPY;  Service: Endoscopy;                Laterality: N/A; 2013: EYE SURGERY     Comment:  bilateral cataracts with implants No date: JOINT REPLACEMENT     Comment:  left knee  TKR No date: KIDNEY STONE SURGERY     Comment:  40 yrs ago, while pregnant, she had to 'be cut in 1/2 to              get stone out' No date: KNEE ARTHROSCOPY     Comment:  left knee No date: LITHOTRIPSY No date: ltk; Left 09/06/2016: LUMBAR LAMINECTOMY/DECOMPRESSION MICRODISCECTOMY; N/A     Comment:  Procedure: LAMINECTOMY AND FORAMINOTOMY LUMBAR THREE-  LUMBAR FOUR, LUMBAR FOUR- LUMBAR FIVE;  Surgeon: Newman Pies, MD;  Location: [place];  Service: Neurosurgery;                Laterality: N/A;  LAMINECTOMY AND FORAMINOTOMY LUMBAR 3-               LUMBAR 4, LUMBAR 4- LUMBAR 5  12/08/2020: REVERSE SHOULDER ARTHROPLASTY; Right     Comment:  Procedure: Right reverse total shoulder arthroplasty and              biceps tenodesis;  Surgeon: Leim Fabry, MD;  Location:               [facility] ORS;  Service: Orthopedics;  Laterality: Right; 1979: throidectomy     Comment:  goiter No date: TRIGGER FINGER RELEASE; Left BMI    Body Mass Index: 37.10 kg/m     Reproductive/Obstetrics negative OB ROS                             Anesthesia Physical Anesthesia Plan  ASA: 3  Anesthesia Plan: General and Spinal   Post-op Pain Management:    Induction:   PONV Risk Score and Plan:   Airway Management Planned:   Additional Equipment:   Intra-op Plan:   Post-operative Plan:   Informed Consent: I have reviewed the patients History and Physical, chart, labs and  discussed the procedure including the risks, benefits and alternatives for the proposed anesthesia with the patient or authorized representative who has indicated his/her understanding and acceptance.     Dental Advisory Given  Plan Discussed with: CRNA  Anesthesia Plan Comments: (Pt. With previous back surgery but willing to proceed with an attempt at SAB.  May ultimately require GOT as discussed and accepted. ja)        Anesthesia Quick Evaluation

## 2021-11-06 NOTE — Op Note (Signed)
OPERATIVE NOTE  DATE OF SURGERY:  11/06/2021  PATIENT NAME:  [name]   DOB: 10/18/1945  MRN: [id number]  PRE-OPERATIVE DIAGNOSIS: Degenerative arthrosis of the right knee, primary  POST-OPERATIVE DIAGNOSIS:  Same  PROCEDURE:  Right total knee arthroplasty using computer-assisted navigation  SURGEON:  Marciano Sequin. M.D.  ANESTHESIA: spinal  ESTIMATED BLOOD LOSS: 50 mL  FLUIDS REPLACED: 500 mL of crystalloid  TOURNIQUET TIME: 89 minutes  DRAINS: Prevena wound VAC  SOFT TISSUE RELEASES: Anterior cruciate ligament, posterior cruciate ligament, deep medial collateral ligament, patellofemoral ligament  IMPLANTS UTILIZED: DePuy Attune size 5N posterior stabilized femoral component (cemented), size 4 rotating platform tibial component (cemented), 35 mm medialized dome patella (cemented), and an 8 mm stabilized rotating platform polyethylene insert.  INDICATIONS FOR SURGERY: [name] is a 76 y.o. year old female with a long history of progressive knee pain. X-rays demonstrated severe degenerative changes in tricompartmental fashion. The patient had not seen any significant improvement despite conservative nonsurgical intervention. After discussion of the risks and benefits of surgical intervention, the patient expressed understanding of the risks benefits and agree with plans for total knee arthroplasty.   The risks, benefits, and alternatives were discussed at length including but not limited to the risks of infection, bleeding, nerve injury, stiffness, blood clots, the need for revision surgery, cardiopulmonary complications, among others, and they were willing to proceed.  PROCEDURE IN DETAIL: The patient was brought into the operating room and, after adequate spinal anesthesia was achieved, a tourniquet was placed on the patient's upper thigh. The patient's knee and leg were cleaned and prepped with alcohol and DuraPrep and draped in the usual sterile  fashion. A "timeout" was performed as per usual protocol. The lower extremity was exsanguinated using an Esmarch, and the tourniquet was inflated to 300 mmHg. An anterior longitudinal incision was made followed by a standard mid vastus approach. The deep fibers of the medial collateral ligament were elevated in a subperiosteal fashion off of the medial flare of the tibia so as to maintain a continuous soft tissue sleeve. The patella was subluxed laterally and the patellofemoral ligament was incised. Inspection of the knee demonstrated severe degenerative changes with full-thickness loss of articular cartilage. Osteophytes were debrided using a rongeur. Anterior and posterior cruciate ligaments were excised. Two 4.0 mm Schanz pins were inserted in the femur and into the tibia for attachment of the array of trackers used for computer-assisted navigation. Hip center was identified using a circumduction technique. Distal landmarks were mapped using the computer. The distal femur and proximal tibia were mapped using the computer. The distal femoral cutting guide was positioned using computer-assisted navigation so as to achieve a 5 distal valgus cut. The femur was sized and it was felt that a size 5N femoral component was appropriate. A size 5 femoral cutting guide was positioned and the anterior cut was performed and verified using the computer. This was followed by completion of the posterior and chamfer cuts. Femoral cutting guide for the central box was then positioned in the center box cut was performed.  Attention was then directed to the proximal tibia. Medial and lateral menisci were excised. The extramedullary tibial cutting guide was positioned using computer-assisted navigation so as to achieve a 0 varus-valgus alignment and 3 posterior slope. The cut was performed and verified using the computer. The proximal tibia was sized and it was felt that a size 4 tibial tray was appropriate. Tibial and femoral  trials were inserted followed by  insertion of an 8 mm polyethylene insert. This allowed for excellent mediolateral soft tissue balancing both in flexion and in full extension. Finally, the patella was cut and prepared so as to accommodate a 35 mm medialized dome patella. A patella trial was placed and the knee was placed through a range of motion with excellent patellar tracking appreciated. The femoral trial was removed after debridement of posterior osteophytes. The central post-hole for the tibial component was reamed followed by insertion of a keel punch. Tibial trials were then removed. Cut surfaces of bone were irrigated with copious amounts of normal saline using pulsatile lavage and then suctioned dry. Polymethylmethacrylate cement with gentamicin was prepared in the usual fashion using a vacuum mixer. Cement was applied to the cut surface of the proximal tibia as well as along the undersurface of a size 4 rotating platform tibial component. Tibial component was positioned and impacted into place. Excess cement was removed using [employer] fast streamer. Cement was then applied to the cut surfaces of the femur as well as along the posterior flanges of the size 5N femoral component. The femoral component was positioned and impacted into place. Excess cement was removed using [employer] fast streamer. An 8 mm polyethylene trial was inserted and the knee was brought into full extension with steady axial compression applied. Finally, cement was applied to the backside of a 35 mm medialized dome patella and the patellar component was positioned and patellar clamp applied. Excess cement was removed using [employer] fast streamer. After adequate curing of the cement, the tourniquet was deflated after a total tourniquet time of 89 minutes. Hemostasis was achieved using electrocautery. The knee was irrigated with copious amounts of normal saline using pulsatile lavage followed by 450 ml of Surgiphor and then suctioned dry. 20 mL of 1.3%  Exparel and 60 mL of 0.25% Marcaine in 40 mL of normal saline was injected along the posterior capsule, medial and lateral gutters, and along the arthrotomy site. An 8 mm stabilized rotating platform polyethylene insert was inserted and the knee was placed through a range of motion with excellent mediolateral soft tissue balancing appreciated and excellent patellar tracking noted. The medial parapatellar portion of the incision was reapproximated using interrupted sutures of #1 Vicryl. Subcutaneous tissue was approximated in layers using first #0 Vicryl followed #2-0 Vicryl. The skin was approximated with skin staples.  Given the amount of soft tissue edema, it was elected to place a Prevena wound VAC over the incision.  A sterile dressing was applied.  The patient tolerated the procedure well and was transported to the recovery room in stable condition.    Samie Barclift P. Holley Bouche., M.D.

## 2021-11-06 NOTE — H&P (Signed)
The patient has been re-examined, and the chart reviewed, and there have been no interval changes to the documented history and physical.    The risks, benefits, and alternatives have been discussed at length. The patient expressed understanding of the risks benefits and agreed with plans for surgical intervention.  [name], Jr. M.D.    

## 2021-11-07 DIAGNOSIS — Z7982 Long term (current) use of aspirin: Secondary | ICD-10-CM | POA: Diagnosis not present

## 2021-11-07 DIAGNOSIS — I10 Essential (primary) hypertension: Secondary | ICD-10-CM | POA: Diagnosis not present

## 2021-11-07 DIAGNOSIS — M1711 Unilateral primary osteoarthritis, right knee: Secondary | ICD-10-CM | POA: Diagnosis not present

## 2021-11-07 DIAGNOSIS — E119 Type 2 diabetes mellitus without complications: Secondary | ICD-10-CM | POA: Diagnosis not present

## 2021-11-07 DIAGNOSIS — Z96611 Presence of right artificial shoulder joint: Secondary | ICD-10-CM | POA: Diagnosis not present

## 2021-11-07 DIAGNOSIS — Z7984 Long term (current) use of oral hypoglycemic drugs: Secondary | ICD-10-CM | POA: Diagnosis not present

## 2021-11-07 DIAGNOSIS — Z79899 Other long term (current) drug therapy: Secondary | ICD-10-CM | POA: Diagnosis not present

## 2021-11-07 LAB — GLUCOSE, CAPILLARY
Glucose-Capillary: 233 mg/dL — ABNORMAL HIGH (ref 70–99)
Glucose-Capillary: 224 mg/dL — ABNORMAL HIGH (ref 70–99)

## 2021-11-07 MED ORDER — OXYCODONE HCL 5 MG PO TABS: 5.0000 mg | ORAL_TABLET | ORAL | 0 refills | Status: DC | PRN

## 2021-11-07 MED ORDER — CELECOXIB 200 MG PO CAPS: 200.0000 mg | ORAL_CAPSULE | Freq: Two times a day (BID) | ORAL | 1 refills | Status: DC

## 2021-11-07 MED ORDER — ENOXAPARIN SODIUM 40 MG/0.4ML IJ SOSY: 40.0000 mg | PREFILLED_SYRINGE | INTRAMUSCULAR | 0 refills | Status: DC

## 2021-11-07 MED ORDER — TRAMADOL HCL 50 MG PO TABS: 50.0000 mg | ORAL_TABLET | ORAL | 0 refills | Status: DC | PRN

## 2021-11-07 NOTE — Progress Notes (Signed)
Physical Therapy Treatment Patient Details Name: [name] MRN: [id number] DOB: 07-28-1945 Today's Date: 11/07/2021   History of Present Illness admitted for acute hospitalization s/p R TKA, WBAT (11/06/21)    PT Comments    Pt seen for PT tx with pt agreeable. Pt is verbose throughout session but follows cuing/commands throughout.  Pt is able to complete STS with close supervision<>CGA & ambulate ~60 ft with RW & close supervision but is limited by fatigue & need to sit. Upon further discussion, pt reports she only ambulates household distances at baseline (max of ~120 ft).  Pt negotiates 4 steps with R rail/B rails to simulate home environment with min assist with pt reporting she feels good about that task. Team notified of pt's progress. Will continue to follow pt acutely to address R knee ROM & strengthening, balance, and gait with LRAD.    Recommendations for follow up therapy are one component of a multi-disciplinary discharge planning process, led by the attending physician.  Recommendations may be updated based on patient status, additional functional criteria and insurance authorization.  Follow Up Recommendations  Home health PT     Assistance Recommended at Discharge Intermittent Supervision/Assistance  Patient can return home with the following A little help with walking and/or transfers;A little help with bathing/dressing/bathroom;Help with stairs or ramp for entrance;Assistance with cooking/housework;Assist for transportation   Equipment Recommendations  Rolling walker (2 wheels) (pt reports she has a BSC but needs the bucket, plans to guy one)    Recommendations for Other Services       Precautions / Restrictions Precautions Precautions: Fall Restrictions Weight Bearing Restrictions: Yes RLE Weight Bearing: Weight bearing as tolerated     Mobility  Bed Mobility               General bed mobility comments: pt received & left sitting in recliner     Transfers Overall transfer level: Needs assistance Equipment used: Rolling walker (2 wheels) Transfers: Sit to/from Stand Sit to Stand: Supervision, Min guard           General transfer comment: educational cuing re: hand placement, good demo from pt    Ambulation/Gait Ambulation/Gait assistance: Supervision, Min guard Gait Distance (Feet): 60 Feet Assistive device: Rolling walker (2 wheels) Gait Pattern/deviations: Decreased step length - left, Decreased step length - right, Decreased weight shift to right Gait velocity: decreased     General Gait Details: decreased R knee flexion & hip flexion during swing phase, decreased RLE heel strike   Stairs Stairs: Yes Stairs assistance: Min assist Stair Management: Two rails, One rail Right Number of Stairs: 4 General stair comments: step to with comepnsatory pattern, pt uses either R rail or B rails to simulate home set up (initially descends stairs backwards but turns laterally to R half way through, reporting this is how she does it at home), pt reports she feels good about negotiating stairs at home & one of her children will be there to assist her at d/c   Wheelchair Mobility    Modified Rankin (Stroke Patients Only)       Balance Overall balance assessment: Needs assistance Sitting-balance support: No upper extremity supported, Feet supported Sitting balance-Leahy Scale: Fair     Standing balance support: Bilateral upper extremity supported, Reliant on assistive device for balance Standing balance-Leahy Scale: [place]  Arousal/Alertness: Awake/alert Behavior During Therapy: WFL for tasks assessed/performed Overall Cognitive Status: Within Functional Limits for tasks assessed                                 General Comments: Pt verbose throughout session        Exercises      General Comments        Pertinent Vitals/Pain Pain  Assessment Pain Assessment: Faces Faces Pain Scale: Hurts a little bit Pain Location: R LE Pain Descriptors / Indicators: Aching, Grimacing, Guarding, Discomfort Pain Intervention(s): Monitored during session    Home Living                          Prior Function            PT Goals (current goals can now be found in the care plan section) Acute Rehab PT Goals Patient Stated Goal: to return home PT Goal Formulation: With patient/family Time For Goal Achievement: 11/20/21 Potential to Achieve Goals: Good Progress towards PT goals: Progressing toward goals    Frequency    BID      PT Plan Current plan remains appropriate    Co-evaluation              AM-PAC PT "6 Clicks" Mobility   Outcome Measure  Help needed turning from your back to your side while in a flat bed without using bedrails?: None Help needed moving from lying on your back to sitting on the side of a flat bed without using bedrails?: A Little Help needed moving to and from a bed to a chair (including a wheelchair)?: A Little Help needed standing up from a chair using your arms (e.g., wheelchair or bedside chair)?: A Little Help needed to walk in hospital room?: A Little Help needed climbing 3-5 steps with a railing? : A Little 6 Click Score: 19    End of Session Equipment Utilized During Treatment: Gait belt Activity Tolerance: Patient limited by fatigue;Patient tolerated treatment well Patient left: in chair;with chair alarm set;with call bell/phone within reach Nurse Communication: Mobility status PT Visit Diagnosis: Other abnormalities of gait and mobility (R26.89);Difficulty in walking, not elsewhere classified (R26.2);Pain;Unsteadiness on feet (R26.81);Muscle weakness (generalized) (M62.81) Pain - Right/Left: Right Pain - part of body: Knee     Time: 0100-7121 PT Time Calculation (min) (ACUTE ONLY): 33 min  Charges:  $Gait Training: 8-22 mins $Therapeutic Activity: 8-22  mins                     Lavone Nian, PT, DPT 11/07/21, 9:45 AM    Waunita Schooner 11/07/2021, 9:44 AM

## 2021-11-07 NOTE — TOC Transition Note (Signed)
Transition of Care [facility]) - CM/SW Discharge Note   Patient Details  Name: [name] MRN: [id number] Date of Birth: 05-25-1945  [facility] CM/SW Contact:  Izola Price, RN Phone Number: 11/07/2021, 9:47 AM   Clinical Narrative:  Patient has discharge to home/with [facility]. [place] set up via [place] [facility]. Spoke with patient and she has both a front wheel rolling walker. Simmie Davies RN CM     Final next level of care: Home w Home Health Services Barriers to Discharge: Barriers Resolved   Patient Goals and CMS Choice        Discharge Placement                       Discharge Plan and Services                DME Arranged: N/A (Patient has RW and a Rollator.) DME Agency: NA         HH Agency: [place]        Social Determinants of Health (SDOH) Interventions     Readmission Risk Interventions     No data to display

## 2021-11-07 NOTE — Evaluation (Signed)
Occupational Therapy Evaluation Patient Details Name: [name] MRN: [id number] DOB: 1946/02/21 Today's Date: 11/07/2021   History of Present Illness admitted for acute hospitalization s/p R TKA, WBAT (11/06/21)   Clinical Impression   Pt seen for OT evaluation this date, POD#1 from above surgery. Pt was modified independent in all ADLs prior to surgery. She endorses using a 4WW for functional mobility in her home and community. Pt is eager to return to PLOF with less pain and improved safety and independence. Pt currently requires minimal assist for LB dressing while in seated position due to pain and limited AROM of R knee. Pt/family members instructed in polar care mgt, falls prevention strategies, home/routines modifications, DME/AE for LB bathing and dressing tasks, and compression stocking mgt. Pt would benefit from skilled OT services including additional instruction in dressing techniques with or without assistive devices for dressing and bathing skills to support recall and carryover prior to discharge and ultimately to maximize safety, independence, and minimize falls risk and caregiver burden. Do not currently anticipate any OT needs following this hospitalization.         Recommendations for follow up therapy are one component of a multi-disciplinary discharge planning process, led by the attending physician.  Recommendations may be updated based on patient status, additional functional criteria and insurance authorization.   Follow Up Recommendations  No OT follow up    Assistance Recommended at Discharge    Patient can return home with the following      Functional Status Assessment     Equipment Recommendations  BSC/3in1;Other (comment) [facility]ie Petit [facility])    Recommendations for Other Services       Precautions / Restrictions Precautions Precautions: Fall Restrictions Weight Bearing Restrictions: Yes RLE Weight Bearing: Weight bearing as tolerated      Mobility  Bed Mobility Overal bed mobility: Needs Assistance Bed Mobility: Sit to Supine       Sit to supine: Supervision        Transfers   Equipment used: Rolling walker (2 wheels) Transfers: Sit to/from Stand, Bed to chair/wheelchair/BSC Sit to Stand: Supervision, Min guard Stand pivot transfers: Supervision, Min guard         General transfer comment: SBA for management of lines/leads.      Balance Overall balance assessment: Needs assistance Sitting-balance support: No upper extremity supported, Feet supported Sitting balance-Leahy Scale: Good Sitting balance - Comments: steady static sitting,r eaching within BOS.   Standing balance support: Bilateral upper extremity supported, Reliant on assistive device for balance, During functional activity Standing balance-Leahy Scale: Fair                             ADL either performed or assessed with clinical judgement   ADL Overall ADL's : Needs assistance/impaired                                       General ADL Comments: SUPERVISION for toilet transfer/toileting, MIN A for LB ADL Management. ANticipate MAX A for compression stocking management. SET UP assist for UB ADLs.     Vision Ability to See in Adequate Light: 0 Adequate Patient Visual Report: No change from baseline       Perception     Praxis      Pertinent Vitals/Pain Pain Assessment Faces Pain Scale: Hurts little more Pain Location: R LE Pain Descriptors /  Indicators: Aching, Grimacing, Guarding, Discomfort Pain Intervention(s): Limited activity within patient's tolerance, Monitored during session, Repositioned     Hand Dominance Right   Extremity/Trunk Assessment Upper Extremity Assessment Upper Extremity Assessment: Overall WFL for tasks assessed   Lower Extremity Assessment Lower Extremity Assessment: Defer to PT evaluation;RLE deficits/detail RLE Deficits / Details: s/p R TKA RLE Coordination: decreased gross  motor       Communication Communication Communication: No difficulties   Cognition Arousal/Alertness: Awake/alert Behavior During Therapy: WFL for tasks assessed/performed Overall Cognitive Status: Within Functional Limits for tasks assessed                                 General Comments: Pt verbose throughout session     General Comments       Exercises Other Exercises Other Exercises: Pt/famly in falls prevention strategies, safe use of AE/DME for LB ADL management, compression stocking management, polar care management, complementary alternative methods for pain management including gentle self-massage and distraction techniques, and routines modifications to support safety and fxl independence during meaningful occupations of daily life. Handout provided.   Shoulder Instructions      Home Living Family/patient expects to be discharged to:: Private residence Living Arrangements: Spouse/significant other Available Help at Discharge: Family;Available PRN/intermittently Type of Home: House Home Access: Stairs to enter [employer] of Steps: 3-4 Entrance Stairs-Rails: Right Home Layout: One level               Home Equipment: Rollator (4 wheels);BSC/3in1   Additional Comments: Pt sleeps in lift chair      Prior Functioning/Environment Prior Level of Function : Independent/Modified Independent             Mobility Comments: Mod indep with 4WRW for ADLs, household and community mobilization          OT Problem List: Decreased strength;Decreased coordination;Decreased activity tolerance;Decreased safety awareness;Impaired balance (sitting and/or standing);Decreased knowledge of use of DME or AE      OT Treatment/Interventions: Self-care/ADL training;Therapeutic exercise;Therapeutic activities;DME and/or AE instruction;Patient/family education;Balance training;Energy conservation    OT Goals(Current goals can be found in the care  plan section) Acute Rehab OT Goals Patient Stated Goal: To go home OT Goal Formulation: With patient/family Time For Goal Achievement: 11/21/21 Potential to Achieve Goals: Good ADL Goals Pt Will Perform Lower Body Dressing: sit to/from stand;with set-up;with supervision Pt Will Transfer to Toilet: bedside commode;ambulating;with modified independence Pt Will Perform Toileting - Clothing Manipulation and hygiene: with modified independence;sit to/from stand;with adaptive equipment (c LRAD PRN)  OT Frequency: Min 2X/week    Co-evaluation              AM-PAC OT "6 Clicks" Daily Activity     Outcome Measure Help from another person eating meals?: None Help from another person taking care of personal grooming?: A Little Help from another person toileting, which includes using toliet, bedpan, or urinal?: A Little Help from another person bathing (including washing, rinsing, drying)?: A Little Help from another person to put on and taking off regular upper body clothing?: A Little Help from another person to put on and taking off regular lower body clothing?: A Little 6 Click Score: 19   End of Session Equipment Utilized During Treatment: Gait belt;Rolling walker (2 wheels)  Activity Tolerance: Patient tolerated treatment well Patient left: in bed;with call bell/phone within reach;with bed alarm set;with family/visitor present  OT Visit Diagnosis: Other abnormalities of gait and mobility (  R26.89);Muscle weakness (generalized) (M62.81)                Time: 2671-2458 OT Time Calculation (min): 43 min Charges:  OT General Charges $OT Visit: 1 Visit OT Evaluation $OT Eval Moderate Complexity: 1 Mod OT Treatments $Self Care/Home Management : 23-37 mins  Shara Blazing, M.S., OTR/L Ascom: [phone] 11/07/21, 12:11 PM

## 2021-11-07 NOTE — Plan of Care (Signed)
  Problem: Coping: Goal: Ability to adjust to condition or change in health will improve Outcome: Progressing   Problem: Fluid Volume: Goal: Ability to maintain a balanced intake and output will improve Outcome: Progressing   Problem: Health Behavior/Discharge Planning: Goal: Ability to identify and utilize available resources and services will improve Outcome: Progressing   Problem: Nutritional: Goal: Maintenance of adequate nutrition will improve Outcome: Progressing   Problem: Skin Integrity: Goal: Risk for impaired skin integrity will decrease Outcome: Progressing   

## 2021-11-07 NOTE — Discharge Summary (Signed)
Physician Discharge Summary  Subjective: 1 Day Post-Op Procedure(s) (LRB): COMPUTER ASSISTED TOTAL KNEE ARTHROPLASTY - DEPUY (Right) Patient reports pain as mild.   Patient seen in rounds with Dr. Posey Pronto. Patient is well, and has had no acute complaints or problems Patient is ready to go home with home health physical therapy  Physician Discharge Summary  Patient ID: [name] MRN: [id number] DOB/AGE: [birth year] 76 y.o.  Admit date: 11/06/2021 Discharge date: 11/07/2021  Admission Diagnoses:  Discharge Diagnoses:  Principal Problem:   Total knee replacement status   Discharged Condition: fair  Hospital Course: The patient is postop day 1 from a right total knee replacement.  She is doing well since surgery.  Her pain is manageable.  Her vitals have remained stable.  She is ready to do physical therapy this morning before going home.  Treatments: surgery:   Right total knee arthroplasty using computer-assisted navigation   SURGEON:  Marciano Sequin. M.D.   ANESTHESIA: spinal   ESTIMATED BLOOD LOSS: 50 mL   FLUIDS REPLACED: 500 mL of crystalloid   TOURNIQUET TIME: 89 minutes   DRAINS: Prevena wound VAC   SOFT TISSUE RELEASES: Anterior cruciate ligament, posterior cruciate ligament, deep medial collateral ligament, patellofemoral ligament   IMPLANTS UTILIZED: DePuy Attune size 5N posterior stabilized femoral component (cemented), size 4 rotating platform tibial component (cemented), 35 mm medialized dome patella (cemented), and an 8 mm stabilized rotating platform polyethylene insert.  Discharge Exam: Blood pressure 137/77, pulse 73, temperature 97.8 F (36.6 C), resp. rate 16, height '5\' 2"'$  (1.575 m), weight 92 kg, SpO2 95 %.   Disposition: Discharge disposition: 01-Home or Self Care        Allergies as of 11/07/2021       Reactions   Lactose Intolerance (gi)    Milk    Morphine And Related Nausea And Vomiting   Vicodin  [hydrocodone-acetaminophen] Nausea And Vomiting   Tolerates acetaminophen        Medication List     TAKE these medications    allopurinol 100 MG tablet Commonly known as: ZYLOPRIM Take 100 mg by mouth daily.   ALPRAZolam 0.5 MG tablet Commonly known as: XANAX Take 0.5 mg by mouth 2 (two) times daily as needed for anxiety.   amLODipine 10 MG tablet Commonly known as: NORVASC Take 10 mg by mouth daily.   aspirin EC 81 MG tablet Take 81 mg by mouth daily. Swallow whole.   celecoxib 200 MG capsule Commonly known as: CELEBREX Take 1 capsule (200 mg total) by mouth 2 (two) times daily.   Chlorpheniramine-DM 4-30 MG Tabs Take by mouth.   enoxaparin 40 MG/0.4ML injection Commonly known as: LOVENOX Inject 0.4 mLs (40 mg total) into the skin daily for 14 days.   furosemide 20 MG tablet Commonly known as: LASIX Take 20 mg by mouth daily as needed for edema.   gabapentin 100 MG capsule Commonly known as: NEURONTIN Take 100-200 mg by mouth See admin instructions. Take two capsules (200 mg) every morning and one capsule (100 mg) every evening   glipiZIDE 5 MG tablet Commonly known as: GLUCOTROL Take 5 mg by mouth daily before breakfast.   HYDROcodone-acetaminophen 5-325 MG tablet Commonly known as: NORCO/VICODIN Take 1 tablet by mouth every 6 (six) hours as needed for moderate pain.   loperamide 2 MG capsule Commonly known as: IMODIUM Take by mouth as needed for diarrhea or loose stools.   Melatonin 5 MG Caps Take by mouth.   Melatonin 5 MG  Caps Take 1 tablet by mouth at bedtime as needed for sleep.   metFORMIN 500 MG tablet Commonly known as: GLUCOPHAGE Take 1 tablet by mouth 3 (three) times daily.   metoprolol tartrate 100 MG tablet Commonly known as: LOPRESSOR Take 100 mg by mouth daily.   OVER THE COUNTER MEDICATION doTerra for healthy immnune system   oxyCODONE 5 MG immediate release tablet Commonly known as: Oxy IR/ROXICODONE Take 1 tablet (5 mg  total) by mouth every 4 (four) hours as needed for moderate pain (pain score 4-6).   OZEMPIC (0.25 OR 0.5 MG/DOSE) [place] Inject 0.5 mg into the skin every 7 (seven) days.   rosuvastatin 10 MG tablet Commonly known as: CRESTOR Take 10 mg by mouth daily.   traMADol 50 MG tablet Commonly known as: ULTRAM Take 1-2 tablets (50-100 mg total) by mouth every 4 (four) hours as needed for moderate pain.   TYLENOL 500 MG tablet Generic drug: acetaminophen Take 500 mg by mouth every 6 (six) hours as needed.   vitamin B-12 500 MCG tablet Commonly known as: CYANOCOBALAMIN Take 500 mcg by mouth daily.   Vitamin D3 50 MCG (2000 UT) Tabs Take by mouth.               Durable Medical Equipment  (From admission, onward)           Start     Ordered   11/06/21 1230  DME Walker rolling  Once       Question:  Patient needs a walker to treat with the following condition  Answer:  Total knee replacement status   11/06/21 1229   11/06/21 1230  DME Bedside commode  Once       Question:  Patient needs a bedside commode to treat with the following condition  Answer:  Total knee replacement status   11/06/21 1229            Follow-up Information     Fausto Skillern, PA-C Follow up on 11/20/2021.   Specialty: Orthopedic Surgery Why: at 1:15pm Contact information: [place] [address] [phone]         Dereck Leep, MD Follow up on 12/17/2021.   Specialty: Orthopedic Surgery Why: at 3:00pm Contact information: [address] [name] [place] [address] [phone]                 Signed: Prescott Parma, Tashon Capp 11/07/2021, 8:02 AM   Objective: Vital signs in last 24 hours: Temp:  [97.4 F (36.3 C)-98 F (36.7 C)] 97.8 F (36.6 C) (07/01 0434) Pulse Rate:  [56-73] 73 (07/01 0434) Resp:  [11-19] 16 (07/01 0434) BP: (111-139)/(59-87) 137/77 (07/01 0434) SpO2:  [92 %-100  %] 95 % (07/01 0434)  Intake/Output from previous day:  Intake/Output Summary (Last 24 hours) at 11/07/2021 0802 Last data filed at 11/07/2021 0311 Gross per 24 hour  Intake 2929.78 ml  Output 220 ml  Net 2709.78 ml    Intake/Output this shift: No intake/output data recorded.  Labs: No results for input(s): "HGB" in the last 72 hours. No results for input(s): "WBC", "RBC", "HCT", "PLT" in the last 72 hours. No results for input(s): "NA", "K", "CL", "CO2", "BUN", "CREATININE", "GLUCOSE", "CALCIUM" in the last 72 hours. No results for input(s): "LABPT", "INR" in the last 72 hours.  EXAM: General - Patient is Alert and Oriented Extremity - Sensation intact distally Intact pulses distally Dorsiflexion/Plantar flexion intact Compartment soft  Incision - clean, dry, with the wound VAC in place Motor Function -plantarflexion and dorsiflexion are intact  Assessment/Plan: 1 Day Post-Op Procedure(s) (LRB): COMPUTER ASSISTED TOTAL KNEE ARTHROPLASTY - DEPUY (Right) Procedure(s) (LRB): COMPUTER ASSISTED TOTAL KNEE ARTHROPLASTY - DEPUY (Right) Past Medical History:  Diagnosis Date   A-fib ([place])    back in 2013, for stent placement in kidney, she had a bout of a-fib.  Now, back in rhythm   Arthritis    Chronic kidney disease    Complication of anesthesia    Diabetes mellitus without complication ([place])    type 2    only takes metformin   Dyspnea    Dysuria    History of kidney stones    Hypertension    Osteopenia    PONV (postoperative nausea and vomiting)    Sleep apnea    lost over 120 lbs, and no long uses it (close to 2 yrs now)   Stroke [facility])    mini 2003--affected right side of body.   took 6 weeks to get over.   Urine, incontinence, stress female    Principal Problem:   Total knee replacement status  Estimated body mass index is 37.1 kg/m as calculated from the following:   Height as of this encounter: '5\' 2"'$  (1.575 m).   Weight as of this encounter: 92 kg. Advance  diet Up with therapy D/C IV fluids Discharge home with home health Diet - Regular diet Follow up - in 2 weeks Activity - WBAT Disposition - Home Condition Upon Discharge - Stable DVT Prophylaxis - Lovenox and support stockings  Reche Dixon, PA-C Orthopaedic Surgery 11/07/2021, 8:02 AM

## 2021-11-07 NOTE — Progress Notes (Signed)
  Subjective: 1 Day Post-Op Procedure(s) (LRB): COMPUTER ASSISTED TOTAL KNEE ARTHROPLASTY - DEPUY (Right) Patient reports pain as mild.   Patient is well, and has had no acute complaints or problems Plan is to go Home after hospital stay. Negative for chest pain and shortness of breath Fever: no Gastrointestinal: Negative for nausea and vomiting  Objective: Vital signs in last 24 hours: Temp:  [97.4 F (36.3 C)-98 F (36.7 C)] 97.8 F (36.6 C) (07/01 0434) Pulse Rate:  [56-73] 73 (07/01 0434) Resp:  [11-19] 16 (07/01 0434) BP: (111-139)/(59-87) 137/77 (07/01 0434) SpO2:  [92 %-100 %] 95 % (07/01 0434)  Intake/Output from previous day:  Intake/Output Summary (Last 24 hours) at 11/07/2021 0759 Last data filed at 11/07/2021 0311 Gross per 24 hour  Intake 3429.78 ml  Output 220 ml  Net 3209.78 ml    Intake/Output this shift: No intake/output data recorded.  Labs: No results for input(s): "HGB" in the last 72 hours. No results for input(s): "WBC", "RBC", "HCT", "PLT" in the last 72 hours. No results for input(s): "NA", "K", "CL", "CO2", "BUN", "CREATININE", "GLUCOSE", "CALCIUM" in the last 72 hours. No results for input(s): "LABPT", "INR" in the last 72 hours.   EXAM General - Patient is Alert and Oriented Extremity - Neurovascular intact Sensation intact distally Dorsiflexion/Plantar flexion intact Compartment soft Dressing/Incision - clean, dry, with the wound VAC in place Motor Function - intact, moving foot and toes well on exam.   Past Medical History:  Diagnosis Date   A-fib ([place])    back in 2013, for stent placement in kidney, she had a bout of a-fib.  Now, back in rhythm   Arthritis    Chronic kidney disease    Complication of anesthesia    Diabetes mellitus without complication ([place])    type 2    only takes metformin   Dyspnea    Dysuria    History of kidney stones    Hypertension    Osteopenia    PONV (postoperative nausea and vomiting)    Sleep apnea     lost over 120 lbs, and no long uses it (close to 2 yrs now)   Stroke [facility])    mini 2003--affected right side of body.   took 6 weeks to get over.   Urine, incontinence, stress female     Assessment/Plan: 1 Day Post-Op Procedure(s) (LRB): COMPUTER ASSISTED TOTAL KNEE ARTHROPLASTY - DEPUY (Right) Principal Problem:   Total knee replacement status  Estimated body mass index is 37.1 kg/m as calculated from the following:   Height as of this encounter: '5\' 2"'$  (1.575 m).   Weight as of this encounter: 92 kg. Advance diet Up with therapy D/C IV fluids Discharge home with home health  DVT Prophylaxis - Lovenox, Foot Pumps, and TED hose Weight-Bearing as tolerated to right leg  Reche Dixon, PA-C Orthopaedic Surgery 11/07/2021, 7:59 AM

## 2021-11-11 NOTE — Anesthesia Postprocedure Evaluation (Signed)
Anesthesia Post Note  Patient: [name]  Procedure(s) Performed: COMPUTER ASSISTED TOTAL KNEE ARTHROPLASTY - DEPUY (Right: Knee)  Patient location during evaluation: PACU Anesthesia Type: Spinal Level of consciousness: awake and alert Pain management: pain level controlled Vital Signs Assessment: post-procedure vital signs reviewed and stable Respiratory status: spontaneous breathing, nonlabored ventilation, respiratory function stable and patient connected to nasal cannula oxygen Cardiovascular status: blood pressure returned to baseline and stable Postop Assessment: no apparent nausea or vomiting Anesthetic complications: no   No notable events documented.   Last Vitals:  Vitals:   11/07/21 0434 11/07/21 0825  BP: 137/77 132/78  Pulse: 73 87  Resp: 16 16  Temp: 36.6 C 36.5 C  SpO2: 95% 99%    Last Pain:  Vitals:   11/07/21 0735  TempSrc:   PainSc: [name]

## 2021-11-20 DIAGNOSIS — Z96651 Presence of right artificial knee joint: Secondary | ICD-10-CM | POA: Diagnosis not present

## 2021-11-20 DIAGNOSIS — R531 Weakness: Secondary | ICD-10-CM | POA: Diagnosis not present

## 2021-11-20 DIAGNOSIS — M25561 Pain in right knee: Secondary | ICD-10-CM | POA: Diagnosis not present

## 2021-11-20 DIAGNOSIS — G8929 Other chronic pain: Secondary | ICD-10-CM | POA: Diagnosis not present

## 2021-11-20 DIAGNOSIS — M25661 Stiffness of right knee, not elsewhere classified: Secondary | ICD-10-CM | POA: Diagnosis not present

## 2021-11-24 DIAGNOSIS — Z96651 Presence of right artificial knee joint: Secondary | ICD-10-CM | POA: Diagnosis not present

## 2021-11-24 DIAGNOSIS — M25561 Pain in right knee: Secondary | ICD-10-CM | POA: Diagnosis not present

## 2021-11-26 DIAGNOSIS — Z96651 Presence of right artificial knee joint: Secondary | ICD-10-CM | POA: Diagnosis not present

## 2021-11-26 DIAGNOSIS — M25561 Pain in right knee: Secondary | ICD-10-CM | POA: Diagnosis not present

## 2021-12-02 DIAGNOSIS — Z96651 Presence of right artificial knee joint: Secondary | ICD-10-CM | POA: Diagnosis not present

## 2021-12-04 DIAGNOSIS — Z96651 Presence of right artificial knee joint: Secondary | ICD-10-CM | POA: Diagnosis not present

## 2021-12-04 DIAGNOSIS — M25661 Stiffness of right knee, not elsewhere classified: Secondary | ICD-10-CM | POA: Diagnosis not present

## 2021-12-08 DIAGNOSIS — M25561 Pain in right knee: Secondary | ICD-10-CM | POA: Diagnosis not present

## 2021-12-08 DIAGNOSIS — Z96651 Presence of right artificial knee joint: Secondary | ICD-10-CM | POA: Diagnosis not present

## 2021-12-10 DIAGNOSIS — Z96651 Presence of right artificial knee joint: Secondary | ICD-10-CM | POA: Diagnosis not present

## 2021-12-15 DIAGNOSIS — Z96651 Presence of right artificial knee joint: Secondary | ICD-10-CM | POA: Diagnosis not present

## 2021-12-15 DIAGNOSIS — M25561 Pain in right knee: Secondary | ICD-10-CM | POA: Diagnosis not present

## 2021-12-17 DIAGNOSIS — M25561 Pain in right knee: Secondary | ICD-10-CM | POA: Diagnosis not present

## 2021-12-17 DIAGNOSIS — Z96651 Presence of right artificial knee joint: Secondary | ICD-10-CM | POA: Diagnosis not present

## 2021-12-31 DIAGNOSIS — I129 Hypertensive chronic kidney disease with stage 1 through stage 4 chronic kidney disease, or unspecified chronic kidney disease: Secondary | ICD-10-CM | POA: Diagnosis not present

## 2021-12-31 DIAGNOSIS — E782 Mixed hyperlipidemia: Secondary | ICD-10-CM | POA: Diagnosis not present

## 2021-12-31 DIAGNOSIS — M109 Gout, unspecified: Secondary | ICD-10-CM | POA: Diagnosis not present

## 2021-12-31 DIAGNOSIS — N189 Chronic kidney disease, unspecified: Secondary | ICD-10-CM | POA: Diagnosis not present

## 2021-12-31 DIAGNOSIS — M8580 Other specified disorders of bone density and structure, unspecified site: Secondary | ICD-10-CM | POA: Diagnosis not present

## 2021-12-31 DIAGNOSIS — I4891 Unspecified atrial fibrillation: Secondary | ICD-10-CM | POA: Diagnosis not present

## 2021-12-31 DIAGNOSIS — G4733 Obstructive sleep apnea (adult) (pediatric): Secondary | ICD-10-CM | POA: Diagnosis not present

## 2021-12-31 DIAGNOSIS — Z471 Aftercare following joint replacement surgery: Secondary | ICD-10-CM | POA: Diagnosis not present

## 2021-12-31 DIAGNOSIS — E1122 Type 2 diabetes mellitus with diabetic chronic kidney disease: Secondary | ICD-10-CM | POA: Diagnosis not present

## 2022-02-04 DIAGNOSIS — E1151 Type 2 diabetes mellitus with diabetic peripheral angiopathy without gangrene: Secondary | ICD-10-CM | POA: Diagnosis not present

## 2022-02-04 DIAGNOSIS — M109 Gout, unspecified: Secondary | ICD-10-CM | POA: Diagnosis not present

## 2022-02-11 DIAGNOSIS — E785 Hyperlipidemia, unspecified: Secondary | ICD-10-CM | POA: Diagnosis not present

## 2022-02-11 DIAGNOSIS — Z23 Encounter for immunization: Secondary | ICD-10-CM | POA: Diagnosis not present

## 2022-02-11 DIAGNOSIS — E1122 Type 2 diabetes mellitus with diabetic chronic kidney disease: Secondary | ICD-10-CM | POA: Diagnosis not present

## 2022-02-11 DIAGNOSIS — N183 Chronic kidney disease, stage 3 unspecified: Secondary | ICD-10-CM | POA: Diagnosis not present

## 2022-03-01 ENCOUNTER — Other Ambulatory Visit: Payer: Self-pay | Admitting: Internal Medicine

## 2022-03-01 DIAGNOSIS — Z1231 Encounter for screening mammogram for malignant neoplasm of breast: Secondary | ICD-10-CM

## 2022-05-05 DIAGNOSIS — J101 Influenza due to other identified influenza virus with other respiratory manifestations: Secondary | ICD-10-CM | POA: Diagnosis not present

## 2022-05-07 DIAGNOSIS — R8290 Unspecified abnormal findings in urine: Secondary | ICD-10-CM | POA: Diagnosis not present

## 2022-05-07 DIAGNOSIS — R399 Unspecified symptoms and signs involving the genitourinary system: Secondary | ICD-10-CM | POA: Diagnosis not present

## 2022-05-27 ENCOUNTER — Ambulatory Visit
Admission: RE | Admit: 2022-05-27 | Discharge: 2022-05-27 | Disposition: A | Discharge status: Home / Self Care | Payer: Medicare HMO | Source: Ambulatory Visit | Attending: Internal Medicine | Admitting: Internal Medicine

## 2022-05-27 DIAGNOSIS — Z1231 Encounter for screening mammogram for malignant neoplasm of breast: Secondary | ICD-10-CM | POA: Insufficient documentation

## 2022-08-11 DIAGNOSIS — E782 Mixed hyperlipidemia: Secondary | ICD-10-CM | POA: Diagnosis not present

## 2022-08-11 DIAGNOSIS — Z79899 Other long term (current) drug therapy: Secondary | ICD-10-CM | POA: Diagnosis not present

## 2022-08-11 DIAGNOSIS — M109 Gout, unspecified: Secondary | ICD-10-CM | POA: Diagnosis not present

## 2022-08-11 DIAGNOSIS — E1151 Type 2 diabetes mellitus with diabetic peripheral angiopathy without gangrene: Secondary | ICD-10-CM | POA: Diagnosis not present

## 2022-08-12 DIAGNOSIS — E1151 Type 2 diabetes mellitus with diabetic peripheral angiopathy without gangrene: Secondary | ICD-10-CM | POA: Diagnosis not present

## 2022-08-18 DIAGNOSIS — I69351 Hemiplegia and hemiparesis following cerebral infarction affecting right dominant side: Secondary | ICD-10-CM | POA: Diagnosis not present

## 2022-08-18 DIAGNOSIS — M79605 Pain in left leg: Secondary | ICD-10-CM | POA: Diagnosis not present

## 2022-08-18 DIAGNOSIS — Z1331 Encounter for screening for depression: Secondary | ICD-10-CM | POA: Diagnosis not present

## 2022-08-18 DIAGNOSIS — E1122 Type 2 diabetes mellitus with diabetic chronic kidney disease: Secondary | ICD-10-CM | POA: Diagnosis not present

## 2022-08-18 DIAGNOSIS — Z0000 Encounter for general adult medical examination without abnormal findings: Secondary | ICD-10-CM | POA: Diagnosis not present

## 2022-08-18 DIAGNOSIS — N183 Chronic kidney disease, stage 3 unspecified: Secondary | ICD-10-CM | POA: Diagnosis not present

## 2022-08-18 DIAGNOSIS — M109 Gout, unspecified: Secondary | ICD-10-CM | POA: Diagnosis not present

## 2022-08-18 DIAGNOSIS — I700 Atherosclerosis of aorta: Secondary | ICD-10-CM | POA: Diagnosis not present

## 2022-09-01 DIAGNOSIS — H35372 Puckering of macula, left eye: Secondary | ICD-10-CM | POA: Diagnosis not present

## 2022-09-01 DIAGNOSIS — E119 Type 2 diabetes mellitus without complications: Secondary | ICD-10-CM | POA: Diagnosis not present

## 2022-09-01 DIAGNOSIS — H43813 Vitreous degeneration, bilateral: Secondary | ICD-10-CM | POA: Diagnosis not present

## 2022-09-07 DIAGNOSIS — R110 Nausea: Secondary | ICD-10-CM | POA: Diagnosis not present

## 2022-09-07 DIAGNOSIS — B029 Zoster without complications: Secondary | ICD-10-CM | POA: Diagnosis not present

## 2022-09-07 DIAGNOSIS — R300 Dysuria: Secondary | ICD-10-CM | POA: Diagnosis not present

## 2022-09-10 ENCOUNTER — Inpatient Hospital Stay
Admission: EM | Admit: 2022-09-10 | Discharge: 2022-09-12 | DRG: 384 | Disposition: A | Discharge status: Home Health | Payer: Medicare HMO | Attending: Internal Medicine | Admitting: Internal Medicine

## 2022-09-10 ENCOUNTER — Other Ambulatory Visit: Payer: Self-pay

## 2022-09-10 ENCOUNTER — Emergency Department: Payer: Medicare HMO

## 2022-09-10 ENCOUNTER — Encounter: Payer: Self-pay | Admitting: Radiology

## 2022-09-10 ENCOUNTER — Inpatient Hospital Stay: Payer: Medicare HMO

## 2022-09-10 DIAGNOSIS — B029 Zoster without complications: Secondary | ICD-10-CM | POA: Diagnosis present

## 2022-09-10 DIAGNOSIS — I10 Essential (primary) hypertension: Secondary | ICD-10-CM | POA: Diagnosis not present

## 2022-09-10 DIAGNOSIS — Z79899 Other long term (current) drug therapy: Secondary | ICD-10-CM

## 2022-09-10 DIAGNOSIS — N390 Urinary tract infection, site not specified: Secondary | ICD-10-CM | POA: Diagnosis present

## 2022-09-10 DIAGNOSIS — E1122 Type 2 diabetes mellitus with diabetic chronic kidney disease: Secondary | ICD-10-CM | POA: Diagnosis not present

## 2022-09-10 DIAGNOSIS — T380X5A Adverse effect of glucocorticoids and synthetic analogues, initial encounter: Secondary | ICD-10-CM | POA: Diagnosis present

## 2022-09-10 DIAGNOSIS — N200 Calculus of kidney: Secondary | ICD-10-CM | POA: Diagnosis not present

## 2022-09-10 DIAGNOSIS — Z96651 Presence of right artificial knee joint: Secondary | ICD-10-CM | POA: Diagnosis present

## 2022-09-10 DIAGNOSIS — Z87442 Personal history of urinary calculi: Secondary | ICD-10-CM

## 2022-09-10 DIAGNOSIS — Y92009 Unspecified place in unspecified non-institutional (private) residence as the place of occurrence of the external cause: Secondary | ICD-10-CM

## 2022-09-10 DIAGNOSIS — R7989 Other specified abnormal findings of blood chemistry: Secondary | ICD-10-CM | POA: Diagnosis not present

## 2022-09-10 DIAGNOSIS — N184 Chronic kidney disease, stage 4 (severe): Secondary | ICD-10-CM | POA: Diagnosis not present

## 2022-09-10 DIAGNOSIS — R112 Nausea with vomiting, unspecified: Secondary | ICD-10-CM

## 2022-09-10 DIAGNOSIS — M8580 Other specified disorders of bone density and structure, unspecified site: Secondary | ICD-10-CM | POA: Diagnosis present

## 2022-09-10 DIAGNOSIS — R000 Tachycardia, unspecified: Secondary | ICD-10-CM | POA: Diagnosis not present

## 2022-09-10 DIAGNOSIS — Z791 Long term (current) use of non-steroidal anti-inflammatories (NSAID): Secondary | ICD-10-CM | POA: Diagnosis not present

## 2022-09-10 DIAGNOSIS — I129 Hypertensive chronic kidney disease with stage 1 through stage 4 chronic kidney disease, or unspecified chronic kidney disease: Secondary | ICD-10-CM | POA: Diagnosis present

## 2022-09-10 DIAGNOSIS — I482 Chronic atrial fibrillation, unspecified: Secondary | ICD-10-CM | POA: Diagnosis present

## 2022-09-10 DIAGNOSIS — K279 Peptic ulcer, site unspecified, unspecified as acute or chronic, without hemorrhage or perforation: Secondary | ICD-10-CM | POA: Diagnosis not present

## 2022-09-10 DIAGNOSIS — S0990XA Unspecified injury of head, initial encounter: Secondary | ICD-10-CM | POA: Diagnosis not present

## 2022-09-10 DIAGNOSIS — Z7985 Long-term (current) use of injectable non-insulin antidiabetic drugs: Secondary | ICD-10-CM

## 2022-09-10 DIAGNOSIS — F419 Anxiety disorder, unspecified: Secondary | ICD-10-CM | POA: Diagnosis not present

## 2022-09-10 DIAGNOSIS — R531 Weakness: Secondary | ICD-10-CM | POA: Diagnosis not present

## 2022-09-10 DIAGNOSIS — M1990 Unspecified osteoarthritis, unspecified site: Secondary | ICD-10-CM | POA: Diagnosis present

## 2022-09-10 DIAGNOSIS — N3000 Acute cystitis without hematuria: Secondary | ICD-10-CM | POA: Diagnosis not present

## 2022-09-10 DIAGNOSIS — E8881 Metabolic syndrome: Secondary | ICD-10-CM | POA: Diagnosis present

## 2022-09-10 DIAGNOSIS — Z789 Other specified health status: Secondary | ICD-10-CM

## 2022-09-10 DIAGNOSIS — Z8673 Personal history of transient ischemic attack (TIA), and cerebral infarction without residual deficits: Secondary | ICD-10-CM | POA: Diagnosis not present

## 2022-09-10 DIAGNOSIS — G4700 Insomnia, unspecified: Secondary | ICD-10-CM | POA: Diagnosis present

## 2022-09-10 DIAGNOSIS — Z96652 Presence of left artificial knee joint: Secondary | ICD-10-CM | POA: Diagnosis present

## 2022-09-10 DIAGNOSIS — I5A Non-ischemic myocardial injury (non-traumatic): Secondary | ICD-10-CM | POA: Diagnosis not present

## 2022-09-10 DIAGNOSIS — E669 Obesity, unspecified: Secondary | ICD-10-CM | POA: Diagnosis present

## 2022-09-10 DIAGNOSIS — R1011 Right upper quadrant pain: Secondary | ICD-10-CM | POA: Diagnosis not present

## 2022-09-10 DIAGNOSIS — Z7982 Long term (current) use of aspirin: Secondary | ICD-10-CM | POA: Diagnosis not present

## 2022-09-10 DIAGNOSIS — W19XXXA Unspecified fall, initial encounter: Secondary | ICD-10-CM | POA: Diagnosis present

## 2022-09-10 DIAGNOSIS — M109 Gout, unspecified: Secondary | ICD-10-CM | POA: Diagnosis present

## 2022-09-10 DIAGNOSIS — K862 Cyst of pancreas: Secondary | ICD-10-CM | POA: Diagnosis present

## 2022-09-10 DIAGNOSIS — E860 Dehydration: Secondary | ICD-10-CM | POA: Diagnosis not present

## 2022-09-10 DIAGNOSIS — Z885 Allergy status to narcotic agent status: Secondary | ICD-10-CM

## 2022-09-10 DIAGNOSIS — E785 Hyperlipidemia, unspecified: Secondary | ICD-10-CM | POA: Diagnosis present

## 2022-09-10 DIAGNOSIS — T3695XA Adverse effect of unspecified systemic antibiotic, initial encounter: Secondary | ICD-10-CM | POA: Diagnosis present

## 2022-09-10 DIAGNOSIS — Z6836 Body mass index (BMI) 36.0-36.9, adult: Secondary | ICD-10-CM | POA: Diagnosis not present

## 2022-09-10 DIAGNOSIS — Z91011 Allergy to milk products: Secondary | ICD-10-CM

## 2022-09-10 DIAGNOSIS — Z7984 Long term (current) use of oral hypoglycemic drugs: Secondary | ICD-10-CM

## 2022-09-10 DIAGNOSIS — K8689 Other specified diseases of pancreas: Secondary | ICD-10-CM | POA: Diagnosis not present

## 2022-09-10 DIAGNOSIS — Z96611 Presence of right artificial shoulder joint: Secondary | ICD-10-CM | POA: Diagnosis present

## 2022-09-10 DIAGNOSIS — E1129 Type 2 diabetes mellitus with other diabetic kidney complication: Secondary | ICD-10-CM | POA: Diagnosis present

## 2022-09-10 LAB — URINALYSIS, W/ REFLEX TO CULTURE (INFECTION SUSPECTED)
Bacteria, UA: NONE SEEN
Bilirubin Urine: NEGATIVE
Glucose, UA: 50 mg/dL — AB
Hgb urine dipstick: NEGATIVE
Ketones, ur: NEGATIVE mg/dL
Nitrite: NEGATIVE
Protein, ur: 100 mg/dL — AB
Specific Gravity, Urine: 1.032 — ABNORMAL HIGH (ref 1.005–1.030)
pH: 5 (ref 5.0–8.0)

## 2022-09-10 LAB — CBC WITH DIFFERENTIAL/PLATELET
Abs Immature Granulocytes: 0.05 10*3/uL (ref 0.00–0.07)
Basophils Absolute: 0 10*3/uL (ref 0.0–0.1)
Basophils Relative: 0 %
Eosinophils Absolute: 0 10*3/uL (ref 0.0–0.5)
Eosinophils Relative: 0 %
HCT: 53.1 % — ABNORMAL HIGH (ref 36.0–46.0)
Hemoglobin: 18.1 g/dL — ABNORMAL HIGH (ref 12.0–15.0)
Immature Granulocytes: 0 %
Lymphocytes Relative: 15 %
Lymphs Abs: 1.8 10*3/uL (ref 0.7–4.0)
MCH: 29.8 pg (ref 26.0–34.0)
MCHC: 34.1 g/dL (ref 30.0–36.0)
MCV: 87.5 fL (ref 80.0–100.0)
Monocytes Absolute: 1.1 10*3/uL — ABNORMAL HIGH (ref 0.1–1.0)
Monocytes Relative: 9 %
Neutro Abs: 8.9 10*3/uL — ABNORMAL HIGH (ref 1.7–7.7)
Neutrophils Relative %: 76 %
Platelets: 265 10*3/uL (ref 150–400)
RBC: 6.07 MIL/uL — ABNORMAL HIGH (ref 3.87–5.11)
RDW: 13.9 % (ref 11.5–15.5)
WBC: 11.8 10*3/uL — ABNORMAL HIGH (ref 4.0–10.5)
nRBC: 0 % (ref 0.0–0.2)

## 2022-09-10 LAB — COMPREHENSIVE METABOLIC PANEL
ALT: 100 U/L — ABNORMAL HIGH (ref 0–44)
AST: 65 U/L — ABNORMAL HIGH (ref 15–41)
Albumin: 3.3 g/dL — ABNORMAL LOW (ref 3.5–5.0)
Alkaline Phosphatase: 69 U/L (ref 38–126)
Anion gap: 12 (ref 5–15)
BUN: 31 mg/dL — ABNORMAL HIGH (ref 8–23)
CO2: 25 mmol/L (ref 22–32)
Calcium: 9.2 mg/dL (ref 8.9–10.3)
Chloride: 96 mmol/L — ABNORMAL LOW (ref 98–111)
Creatinine, Ser: 1.53 mg/dL — ABNORMAL HIGH (ref 0.44–1.00)
GFR, Estimated: 35 mL/min — ABNORMAL LOW (ref >60)
Glucose, Bld: 228 mg/dL — ABNORMAL HIGH (ref 70–99)
Potassium: 4.8 mmol/L (ref 3.5–5.1)
Sodium: 133 mmol/L — ABNORMAL LOW (ref 135–145)
Total Bilirubin: 1.3 mg/dL — ABNORMAL HIGH (ref 0.3–1.2)
Total Protein: 7.2 g/dL (ref 6.5–8.1)

## 2022-09-10 LAB — HEPATITIS PANEL, ACUTE
HCV Ab: NONREACTIVE
Hep A IgM: NONREACTIVE
Hep B C IgM: NONREACTIVE
Hepatitis B Surface Ag: NONREACTIVE

## 2022-09-10 LAB — APTT: aPTT: 20 seconds — ABNORMAL LOW (ref 24–36)

## 2022-09-10 LAB — TROPONIN I (HIGH SENSITIVITY)
Troponin I (High Sensitivity): 18 ng/L — ABNORMAL HIGH (ref <18)
Troponin I (High Sensitivity): 18 ng/L — ABNORMAL HIGH (ref <18)

## 2022-09-10 LAB — PROTIME-INR
INR: 1 (ref 0.8–1.2)
Prothrombin Time: 12.7 seconds (ref 11.4–15.2)

## 2022-09-10 LAB — CBG MONITORING, ED
Glucose-Capillary: 216 mg/dL — ABNORMAL HIGH (ref 70–99)
Glucose-Capillary: 156 mg/dL — ABNORMAL HIGH (ref 70–99)
Glucose-Capillary: 114 mg/dL — ABNORMAL HIGH (ref 70–99)

## 2022-09-10 LAB — ACETAMINOPHEN LEVEL: Acetaminophen (Tylenol), Serum: <10 ug/mL — ABNORMAL LOW (ref 10–30)

## 2022-09-10 MED ORDER — SUCRALFATE 1 G PO TABS
1.0000 g | ORAL_TABLET | Freq: Once | ORAL | Status: AC
  Administered 2022-09-10: 1 g via ORAL
  Filled 2022-09-10: qty 1

## 2022-09-10 MED ORDER — SODIUM CHLORIDE 0.9 % IV SOLN: INTRAVENOUS | Status: DC

## 2022-09-10 MED ORDER — FENTANYL CITRATE PF 50 MCG/ML IJ SOSY
50.0000 ug | PREFILLED_SYRINGE | Freq: Once | INTRAMUSCULAR | Status: AC
  Administered 2022-09-10: 50 ug via INTRAVENOUS
  Filled 2022-09-10: qty 1

## 2022-09-10 MED ORDER — SUCRALFATE 1 GM/10ML PO SUSP
1.0000 g | Freq: Three times a day (TID) | ORAL | Status: DC
  Administered 2022-09-10 – 2022-09-12 (×7): 1 g via ORAL
  Filled 2022-09-10 (×8): qty 10

## 2022-09-10 MED ORDER — AMLODIPINE BESYLATE 10 MG PO TABS
10.0000 mg | ORAL_TABLET | Freq: Every day | ORAL | Status: DC
  Administered 2022-09-10 – 2022-09-12 (×3): 10 mg via ORAL
  Filled 2022-09-10: qty 2
  Filled 2022-09-10: qty 1
  Filled 2022-09-10: qty 2

## 2022-09-10 MED ORDER — INSULIN ASPART 100 UNIT/ML IJ SOLN
0.0000 [IU] | Freq: Three times a day (TID) | INTRAMUSCULAR | Status: DC
  Administered 2022-09-10: 3 [IU] via SUBCUTANEOUS
  Administered 2022-09-10 – 2022-09-11 (×3): 2 [IU] via SUBCUTANEOUS
  Filled 2022-09-10 (×4): qty 1

## 2022-09-10 MED ORDER — SODIUM CHLORIDE 0.9 % IV BOLUS
1000.0000 mL | Freq: Once | INTRAVENOUS | Status: AC
  Administered 2022-09-10: 1000 mL via INTRAVENOUS

## 2022-09-10 MED ORDER — INSULIN ASPART 100 UNIT/ML IJ SOLN: 0.0000 [IU] | Freq: Every day | INTRAMUSCULAR | Status: DC

## 2022-09-10 MED ORDER — VITAMIN D 25 MCG (1000 UNIT) PO TABS
2000.0000 [IU] | ORAL_TABLET | Freq: Every day | ORAL | Status: DC
  Administered 2022-09-11 – 2022-09-12 (×2): 2000 [IU] via ORAL
  Filled 2022-09-10 (×2): qty 2

## 2022-09-10 MED ORDER — ONDANSETRON HCL 4 MG/2ML IJ SOLN: 4.0000 mg | Freq: Three times a day (TID) | INTRAMUSCULAR | Status: DC | PRN

## 2022-09-10 MED ORDER — SODIUM CHLORIDE 0.9 % IV SOLN: 6.2500 mg | Freq: Four times a day (QID) | INTRAVENOUS | Status: DC | PRN

## 2022-09-10 MED ORDER — METOPROLOL TARTRATE 50 MG PO TABS
100.0000 mg | ORAL_TABLET | Freq: Every day | ORAL | Status: DC
  Administered 2022-09-10 – 2022-09-12 (×3): 100 mg via ORAL
  Filled 2022-09-10: qty 2
  Filled 2022-09-10: qty 4
  Filled 2022-09-10: qty 2

## 2022-09-10 MED ORDER — PIPERACILLIN-TAZOBACTAM 3.375 G IVPB
3.3750 g | Freq: Three times a day (TID) | INTRAVENOUS | Status: DC
  Administered 2022-09-10 – 2022-09-11 (×3): 3.375 g via INTRAVENOUS
  Filled 2022-09-10 (×3): qty 50

## 2022-09-10 MED ORDER — ONDANSETRON HCL 4 MG/2ML IJ SOLN
4.0000 mg | Freq: Once | INTRAMUSCULAR | Status: AC
Start: 2022-09-10 — End: 2022-09-10
  Administered 2022-09-10: 4 mg via INTRAVENOUS
  Filled 2022-09-10: qty 2

## 2022-09-10 MED ORDER — PANTOPRAZOLE SODIUM 40 MG IV SOLR
40.0000 mg | Freq: Two times a day (BID) | INTRAVENOUS | Status: DC
  Administered 2022-09-10: 40 mg via INTRAVENOUS
  Filled 2022-09-10: qty 10

## 2022-09-10 MED ORDER — GABAPENTIN 100 MG PO CAPS
100.0000 mg | ORAL_CAPSULE | Freq: Two times a day (BID) | ORAL | Status: DC
  Administered 2022-09-10 – 2022-09-12 (×5): 100 mg via ORAL
  Filled 2022-09-10 (×5): qty 1

## 2022-09-10 MED ORDER — ROSUVASTATIN CALCIUM 10 MG PO TABS
10.0000 mg | ORAL_TABLET | Freq: Every day | ORAL | Status: DC
  Administered 2022-09-10 – 2022-09-12 (×3): 10 mg via ORAL
  Filled 2022-09-10 (×3): qty 1

## 2022-09-10 MED ORDER — ALPRAZOLAM 0.5 MG PO TABS: 0.5000 mg | ORAL_TABLET | Freq: Two times a day (BID) | ORAL | Status: DC | PRN

## 2022-09-10 MED ORDER — METOCLOPRAMIDE HCL 5 MG/ML IJ SOLN
10.0000 mg | Freq: Once | INTRAMUSCULAR | Status: AC
  Administered 2022-09-10: 10 mg via INTRAVENOUS
  Filled 2022-09-10: qty 2

## 2022-09-10 MED ORDER — MELATONIN 5 MG PO TABS: 5.0000 mg | ORAL_TABLET | Freq: Every evening | ORAL | Status: DC | PRN

## 2022-09-10 MED ORDER — OXYCODONE HCL 5 MG PO TABS
5.0000 mg | ORAL_TABLET | Freq: Four times a day (QID) | ORAL | Status: DC | PRN
  Administered 2022-09-11 – 2022-09-12 (×2): 5 mg via ORAL
  Filled 2022-09-10 (×2): qty 1

## 2022-09-10 MED ORDER — IOHEXOL 300 MG/ML  SOLN
100.0000 mL | Freq: Once | INTRAMUSCULAR | Status: AC | PRN
  Administered 2022-09-10: 80 mL via INTRAVENOUS

## 2022-09-10 MED ORDER — PANTOPRAZOLE SODIUM 40 MG IV SOLR
40.0000 mg | Freq: Once | INTRAVENOUS | Status: AC
  Administered 2022-09-10: 40 mg via INTRAVENOUS
  Filled 2022-09-10: qty 10

## 2022-09-10 MED ORDER — VALACYCLOVIR HCL 500 MG PO TABS
1000.0000 mg | ORAL_TABLET | Freq: Three times a day (TID) | ORAL | Status: DC
  Administered 2022-09-10 – 2022-09-12 (×6): 1000 mg via ORAL
  Filled 2022-09-10 (×7): qty 2

## 2022-09-10 MED ORDER — HYDRALAZINE HCL 20 MG/ML IJ SOLN: 5.0000 mg | INTRAMUSCULAR | Status: DC | PRN

## 2022-09-10 MED ORDER — ALLOPURINOL 100 MG PO TABS
100.0000 mg | ORAL_TABLET | Freq: Every day | ORAL | Status: DC
  Administered 2022-09-10 – 2022-09-12 (×3): 100 mg via ORAL
  Filled 2022-09-10 (×3): qty 1

## 2022-09-10 MED ORDER — SUCRALFATE 1 G PO TABS: 1.0000 g | ORAL_TABLET | Freq: Three times a day (TID) | ORAL | Status: DC

## 2022-09-10 MED ORDER — FENTANYL CITRATE PF 50 MCG/ML IJ SOSY: 12.5000 ug | PREFILLED_SYRINGE | INTRAMUSCULAR | Status: DC | PRN

## 2022-09-10 MED ORDER — CYANOCOBALAMIN 500 MCG PO TABS
500.0000 ug | ORAL_TABLET | Freq: Every day | ORAL | Status: DC
  Administered 2022-09-11 – 2022-09-12 (×2): 500 ug via ORAL
  Filled 2022-09-10 (×2): qty 1

## 2022-09-10 NOTE — ED Provider Notes (Addendum)
Procedures     ----------------------------------------- 9:48 AM on 09/10/2022 ----------------------------------------- Lab and CT results discussed with the patient and family members at bedside.  She is having persistent upper abdominal tenderness diffusely, worse in the left upper quadrant but also present in the right upper quadrant.  With her LFT abnormalities, will obtain ultrasound to evaluate for biliary disease.  Will give Carafate as well with CT findings suspicious of peptic ulcer disease.  Son also notes worsening generalized weakness over the past 2 days with a few falls at home.  Will obtain CT head.  ----------------------------------------- 11:42 AM on 09/10/2022 ----------------------------------------- Pt was unable to tolerate [place]. IV fentanyl ordered. Case d/w hospitalist.        Sharman Cheek, MD 09/10/22 1143

## 2022-09-10 NOTE — ED Provider Notes (Addendum)
[facility] Provider Note    Event Date/Time   First MD Initiated Contact with Patient 09/10/22 [phone]     (approximate)   History   Nausea and Emesis (77yof BIB EMS from home with a c/o n/v and weakness x 3 days, the pt describes her emesis as black in color and endorses decreased appetite  . The pt also c/o shingles to the left flank area with pain to that side. The pt is aox4, rr even and unlabored able to communicate in complete sentences.  )   HPI  [name] is a 77 y.o. female   Past medical history of atrial fibrillation in 2013 resolved no anticoagulation, CKD, diabetes, kidney stones, hypertension, prior stroke, who presents to the emergency department with nausea and vomiting for the past week.  Of note she was seen by her primary doctor Dr. Hyacinth Meeker on 09/07/2022 and diagnosed with a shingles rash on her left thorax, and started on a prednisone taper and valacyclovir, was assessed for urinary tract infectious symptoms and started on cefdinir at that time as well.  She was given Zofran for mild nausea at that time.  Since then she is had sleeplessness, vivid dreams, emotional which her son thought may be attributed to her prednisone, and she has stopped taking that for the last 2 days after which the symptoms have gotten much better.  However her nausea and vomiting have gotten worse.  She has not had a bowel movement in about 3 days and has minimal flatus.    She denies abdominal pain but has ongoing urinary frequency.    She has been compliant with all medications other than stopping her prednisone.  Independent Historian contributed to assessment above: Her son who is at bedside to corroborate information given above  External Medical Documents Reviewed: Internal medicine office visit on 09/07/2022 documenting the above course      Physical Exam   Triage Vital Signs: ED Triage Vitals  Enc Vitals Group     BP      Pulse      Resp       Temp      Temp src      SpO2      Weight      Height      Head Circumference      Peak Flow      Pain Score      Pain Loc      Pain Edu?      Excl. in GC?     Most recent vital signs: Vitals:   09/10/22 0613  BP: (!) 158/99  Pulse: 97  Resp: 14  Temp: 98.7 F (37.1 C)  SpO2: 95%    General: Awake, no distress.  CV:  Good peripheral perfusion.  Resp:  Normal effort.  Abd:  No distention.  Other:  Awake alert comfortable appearing mild tachycardia in the low 100s, nontoxic-appearing, soft nontender abdomen, dry mucous membranes looks slightly dehydrated.  She has a vesicular rash on the left thorax/flank   ED Results / Procedures / Treatments   Labs (all labs ordered are listed, but only abnormal results are displayed) Labs Reviewed  CBC WITH DIFFERENTIAL/PLATELET - Abnormal; Notable for the following components:      Result Value   WBC 11.8 (*)    RBC 6.07 (*)    Hemoglobin 18.1 (*)    HCT 53.1 (*)    Neutro Abs 8.9 (*)    Monocytes Absolute  1.1 (*)    All other components within normal limits  COMPREHENSIVE METABOLIC PANEL  URINALYSIS, W/ REFLEX TO CULTURE (INFECTION SUSPECTED)  TROPONIN I (HIGH SENSITIVITY)   I have independently reviewed labs and is significant for mildly elevated white blood cell count 11.8 and hemoglobin which is 18.1 and hematocrit 53.1 in the setting of clinical dehydration may be result of hemoconcentration  EKG  ED ECG REPORT I, Pilar Jarvis, the attending physician, personally viewed and interpreted this ECG.   Date: 09/10/2022  EKG Time: 0636  Rate: 83  Rhythm: sinus  Axis: nl  Intervals:rbbb  ST&T Change: no stemi  PROCEDURES:  Critical Care performed: No  Procedures   MEDICATIONS ORDERED IN ED: Medications  sodium chloride 0.9 % bolus 1,000 mL (1,000 mLs Intravenous New Bag/Given 09/10/22 0629)  ondansetron (ZOFRAN) injection 4 mg (4 mg Intravenous Given 09/10/22 0629)   IMPRESSION / MDM / ASSESSMENT AND PLAN / ED  COURSE  I reviewed the triage vital signs and the nursing notes.                                Patient's presentation is most consistent with acute presentation with potential threat to life or bodily function.  Differential diagnosis includes, but is not limited to, adverse effect of medication, electrolyte derangements, dehydration, DKA, urinary tract infection, obstruction, intra-abdominal infection, cholecystitis, appendicitis   The patient is on the cardiac monitor to evaluate for evidence of arrhythmia and/or significant heart rate changes.  MDM:    This is a patient with recently diagnosed shingles and started on a slew of medications within the last 1 week including prednisone, valacyclovir, Zofran, cefdinir for UTI.  During this time she has ongoing UTI symptoms of urinary frequency and worsening nausea/vomiting and now has not passed a bowel movement in several days.  She has no history of abdominal surgeries but given her symptomatology I am concerned that she may be obstructed, and regarding her poor p.o. intake and ongoing nausea and vomiting I am concerned that he is dehydrated and may have electrolyte disturbances so we will check labs.  I have ordered her for IV crystalloid bolus as well as IV Zofran.  The patient was signed out to the morning provider pending the workup as above.      FINAL CLINICAL IMPRESSION(S) / ED DIAGNOSES   Final diagnoses:  Dehydration  Nausea and vomiting, unspecified vomiting type     Rx / DC Orders   ED Discharge Orders     None        Note:  This document was prepared using Dragon voice recognition software and may include unintentional dictation errors.    Pilar Jarvis, MD 09/10/22 1610    Pilar Jarvis, MD 09/10/22 [phone]

## 2022-09-10 NOTE — Progress Notes (Signed)
       CROSS COVER NOTE  NAME: [name] MRN: [id number] DOB : 10-30-1945    HPI/Events of Note   Report:nursing request to confirm isolation precautions needed for active shingles outbreak  On review of chart:3 day shinglle outbreak treatment with valtrex     Assessment and  Interventions   Assessment:  Plan: Per [facility] isolation guidelines quick reference, contact and airborn isolation till seen by ID       Donnie Mesa NP [facility]

## 2022-09-10 NOTE — Consult Note (Signed)
Arlyss Repress, MD [address]  [address]  [place], [place] [address]  Main: [phone]  Fax: [phone] Pager: [phone]   Consultation  Referring Provider:     No ref. provider found Primary Care Physician:  Danella Penton, MD Primary Gastroenterologist:   Gentry Fitz      Reason for Consultation: Upper abdominal pain, ?  Peptic ulcer disease Date of Admission:  09/10/2022 Date of Consultation:  09/10/2022         HPI:   [name] is a 77 y.o. female history of A-fib, currently in normal sinus rhythm, CKD, arthritis, diabetes, history of stroke, hypertension, DM type 2 on ozempic for more than a year, recent diagnosis of shingles, started treatment on 4/30 with Valtrex presented with 3 days history of nausea, vomiting, generalized weakness, poor p.o. intake with decreased appetite and an episode of coffee-ground emesis.  She was recently diagnosed with shingles rash on her left thorax, started on prednisone taper and valacyclovir, was also simultaneously found to have UTI, started on cefdinir.  Patient reported left upper quadrant discomfort.  Vitals normal in the ER, labs revealed BUN/creatinine mildly elevated, mild leukocytosis, hemoglobin 18, normal platelets, AST 65, ALT 100, T. bili 1.3, alkaline phosphatase 69.  Patient could not tolerate right upper quadrant ultrasound.  Underwent CT abdomen and pelvis with contrast which revealed circumferential wall thickening in the distal stomach and duodenum with mild surrounding soft tissue stranding suspicious for peptic ulcer disease or duodenitis.  There was no other acute intra-abdominal pathology.  Patient had history of choledocholithiasis underwent ERCP with biliary sphincterotomy in 03/2019.  She deferred to undergo cholecystectomy at that time. She started on Protonix 40 IV twice daily, Carafate, kept n.p.o. and GI is consulted for further evaluation  NSAIDs: None  Antiplts/Anticoagulants/Anti thrombotics:  None  GI Procedures:  Colonoscopy 01/09/2015 Left colon diverticulosis Internal hemorrhoids  Past Medical History:  Diagnosis Date   A-fib (HCC)    back in 2013, for stent placement in kidney, she had a bout of a-fib.  Now, back in rhythm   Arthritis    Chronic kidney disease    Complication of anesthesia    Diabetes mellitus without complication (HCC)    type 2    only takes metformin   Dyspnea    Dysuria    History of kidney stones    Hypertension    Osteopenia    PONV (postoperative nausea and vomiting)    Sleep apnea    lost over 120 lbs, and no long uses it (close to 2 yrs now)   Stroke [facility])    mini 2003--affected right side of body.   took 6 weeks to get over.   Urine, incontinence, stress female     Past Surgical History:  Procedure Laterality Date   BREAST BIOPSY Right 11/22/2019   U/S Bx-HYDROMARK-BENIGN INTRAMAMMARY LYMPH NODE.   COLONOSCOPY W/ POLYPECTOMY Right    COLONOSCOPY WITH PROPOFOL N/A 01/09/2015   Procedure: COLONOSCOPY WITH PROPOFOL;  Surgeon: Scot Jun, MD;  Location: [facility] ENDOSCOPY;  Service: Endoscopy;  Laterality: N/A;   ERCP N/A 03/15/2019   Procedure: ENDOSCOPIC RETROGRADE CHOLANGIOPANCREATOGRAPHY (ERCP);  Surgeon: Midge Minium, MD;  Location: [facility] ENDOSCOPY;  Service: Endoscopy;  Laterality: N/A;   EYE SURGERY  2013   bilateral cataracts with implants   JOINT REPLACEMENT     left knee  TKR   KIDNEY STONE SURGERY     40 yrs ago, while pregnant, she had to 'be  cut in 1/2 to get stone out'   KNEE ARTHROPLASTY Right 11/06/2021   Procedure: COMPUTER ASSISTED TOTAL KNEE ARTHROPLASTY - DEPUY;  Surgeon: Donato Heinz, MD;  Location: [facility] ORS;  Service: Orthopedics;  Laterality: Right;   KNEE ARTHROSCOPY     left knee   LITHOTRIPSY     ltk Left    LUMBAR LAMINECTOMY/DECOMPRESSION MICRODISCECTOMY N/A 09/06/2016   Procedure: LAMINECTOMY AND FORAMINOTOMY LUMBAR THREE- LUMBAR FOUR, LUMBAR FOUR- LUMBAR FIVE;  Surgeon: Tressie Stalker, MD;   Location: [facility] OR;  Service: Neurosurgery;  Laterality: N/A;  LAMINECTOMY AND FORAMINOTOMY LUMBAR 3- LUMBAR 4, LUMBAR 4- LUMBAR 5    REVERSE SHOULDER ARTHROPLASTY Right 12/08/2020   Procedure: Right reverse total shoulder arthroplasty and  biceps tenodesis;  Surgeon: Signa Kell, MD;  Location: [facility] ORS;  Service: Orthopedics;  Laterality: Right;   throidectomy  1979   goiter   TRIGGER FINGER RELEASE Left      Current Facility-Administered Medications:    0.9 %  sodium chloride infusion, , Intravenous, Continuous, Lorretta Harp, MD   fentaNYL (SUBLIMAZE) injection 12.5 mcg, 12.5 mcg, Intravenous, Q3H PRN, Lorretta Harp, MD   hydrALAZINE (APRESOLINE) injection 5 mg, 5 mg, Intravenous, Q2H PRN, Lorretta Harp, MD   insulin aspart (novoLOG) injection 0-5 Units, 0-5 Units, Subcutaneous, QHS, Lorretta Harp, MD   insulin aspart (novoLOG) injection 0-9 Units, 0-9 Units, Subcutaneous, TID WC, Lorretta Harp, MD   ondansetron (ZOFRAN) injection 4 mg, 4 mg, Intravenous, Q8H PRN, Lorretta Harp, MD   oxyCODONE (Oxy IR/ROXICODONE) immediate release tablet 5 mg, 5 mg, Oral, Q6H PRN, Lorretta Harp, MD   pantoprazole (PROTONIX) injection 40 mg, 40 mg, Intravenous, Q12H, Lorretta Harp, MD   sucralfate (CARAFATE) tablet 1 g, 1 g, Oral, TID WC & HS, Lorretta Harp, MD  Current Outpatient Medications:    acetaminophen (TYLENOL) 500 MG tablet, Take 500 mg by mouth every 6 (six) hours as needed., Disp: , Rfl:    allopurinol (ZYLOPRIM) 100 MG tablet, Take 100 mg by mouth daily., Disp: , Rfl:    ALPRAZolam (XANAX) 0.5 MG tablet, Take 0.5 mg by mouth 2 (two) times daily as needed for anxiety., Disp: , Rfl:    amLODipine (NORVASC) 10 MG tablet, Take 10 mg by mouth daily. , Disp: , Rfl:    aspirin EC 81 MG tablet, Take 81 mg by mouth daily. Swallow whole., Disp: , Rfl:    cefdinir (OMNICEF) 300 MG capsule, Take 300 mg by mouth 2 (two) times daily., Disp: , Rfl:    Cholecalciferol (VITAMIN D3) 50 MCG (2000 UT) TABS, Take 2,000 Units by mouth  daily., Disp: , Rfl:    furosemide (LASIX) 20 MG tablet, Take 20 mg by mouth daily as needed for edema., Disp: , Rfl:    gabapentin (NEURONTIN) 100 MG capsule, Take 100 mg by mouth 2 (two) times daily., Disp: , Rfl:    glipiZIDE (GLUCOTROL) 5 MG tablet, Take 5 mg by mouth daily before breakfast., Disp: , Rfl:    Melatonin 5 MG CAPS, Take 1 tablet by mouth at bedtime as needed for sleep., Disp: , Rfl:    metFORMIN (GLUCOPHAGE) 500 MG tablet, Take 1 tablet by mouth 3 (three) times daily., Disp: , Rfl:    metoprolol (LOPRESSOR) 100 MG tablet, Take 100 mg by mouth daily. , Disp: , Rfl:    ondansetron (ZOFRAN-ODT) 4 MG disintegrating tablet, Take 4 mg by mouth every 8 (eight) hours as needed for nausea., Disp: , Rfl:    OVER THE COUNTER  MEDICATION, doTerra for healthy immnune system, Disp: , Rfl:    oxyCODONE (OXY IR/ROXICODONE) 5 MG immediate release tablet, Take 1 tablet (5 mg total) by mouth every 4 (four) hours as needed for moderate pain (pain score 4-6)., Disp: 30 tablet, Rfl: 0   predniSONE (DELTASONE) 10 MG tablet, Take 10-40 mg by mouth daily with breakfast., Disp: , Rfl:    rosuvastatin (CRESTOR) 10 MG tablet, Take 10 mg by mouth daily., Disp: , Rfl:    Semaglutide (OZEMPIC, 0.25 OR 0.5 MG/DOSE, [place]), Inject 0.5 mg into the skin every 7 (seven) days., Disp: , Rfl:    valACYclovir (VALTREX) 1000 MG tablet, Take 1,000 mg by mouth 3 (three) times daily., Disp: , Rfl:    vitamin B-12 (CYANOCOBALAMIN) 500 MCG tablet, Take 500 mcg by mouth daily., Disp: , Rfl:    celecoxib (CELEBREX) 200 MG capsule, Take 1 capsule (200 mg total) by mouth 2 (two) times daily. (Patient not taking: Reported on 09/10/2022), Disp: 60 capsule, Rfl: 1   Chlorpheniramine-DM 4-30 MG TABS, Take by mouth., Disp: , Rfl:    enoxaparin (LOVENOX) 40 MG/0.4ML injection, Inject 0.4 mLs (40 mg total) into the skin daily for 14 days., Disp: 5.6 mL, Rfl: 0   HYDROcodone-acetaminophen (NORCO/VICODIN) 5-325 MG tablet, Take 1 tablet by mouth  every 6 (six) hours as needed for moderate pain. (Patient not taking: Reported on 09/10/2022), Disp: , Rfl:    loperamide (IMODIUM) 2 MG capsule, Take by mouth as needed for diarrhea or loose stools., Disp: , Rfl:    Melatonin 5 MG CAPS, Take by mouth., Disp: , Rfl:    traMADol (ULTRAM) 50 MG tablet, Take 1-2 tablets (50-100 mg total) by mouth every 4 (four) hours as needed for moderate pain. (Patient not taking: Reported on 09/10/2022), Disp: 30 tablet, Rfl: 0   Family History  Problem Relation Age of Onset   Breast cancer Neg Hx      Social History   Tobacco Use   Smoking status: Never   Smokeless tobacco: Never  Vaping Use   Vaping Use: Never used  Substance Use Topics   Alcohol use: No   Drug use: No    Allergies as of 09/10/2022 - Review Complete 09/10/2022  Allergen Reaction Noted   Lactose intolerance (gi)  01/24/2019   Morphine and related Nausea And Vomiting 07/09/2016   Vicodin [hydrocodone-acetaminophen] Nausea And Vomiting 07/09/2016    Review of Systems:    All systems reviewed and negative except where noted in HPI.   Physical Exam:  Vital signs in last 24 hours: Temp:  [98.7 F (37.1 C)] 98.7 F (37.1 C) (05/03 0613) Pulse Rate:  [94-99] 94 (05/03 1145) Resp:  [12-24] 12 (05/03 1145) BP: (137-158)/(99) 137/99 (05/03 1145) SpO2:  [87 %-97 %] 97 % (05/03 1145) Weight:  [90 kg] 90 kg (05/03 0618)   General:   Pleasant, cooperative in NAD Head:  Normocephalic and atraumatic. Eyes:   No icterus.   Conjunctiva pink. PERRLA. Ears:  Normal auditory acuity. Neck:  Supple; no masses or thyroidomegaly Lungs: Respirations even and unlabored. Lungs clear to auscultation bilaterally.   No wheezes, crackles, or rhonchi.  Heart:  Regular rate and rhythm;  Without murmur, clicks, rubs or gallops Abdomen:  Soft, nondistended, nontender. Normal bowel sounds. No appreciable masses or hepatomegaly.  No rebound or guarding.  Rectal:  Not performed. Msk:  Symmetrical without  gross deformities.  Strength generalized weakness Extremities:  Without edema, cyanosis or clubbing. Neurologic:  Alert and oriented  x3;  grossly normal neurologically. Skin:  Intact without significant lesions or rashes. Psych:  Alert and cooperative. Normal affect.  LAB RESULTS:    Latest Ref Rng & Units 09/10/2022    6:23 AM 10/25/2021   10:27 AM 10/23/2021    3:01 PM  CBC  WBC 4.0 - 10.5 K/uL 11.8  8.6  9.5   Hemoglobin 12.0 - 15.0 g/dL 16.1  09.6  04.5   Hematocrit 36.0 - 46.0 % 53.1  46.2  47.4   Platelets 150 - 400 K/uL 265  257  279     BMET    Latest Ref Rng & Units 09/10/2022    6:23 AM 10/25/2021   10:27 AM 10/23/2021    3:01 PM  BMP  Glucose 70 - 99 mg/dL 409  94  811   BUN 8 - 23 mg/dL 31  21  27    Creatinine 0.44 - 1.00 mg/dL 9.14  7.82  9.56   Sodium 135 - 145 mmol/L 133  137  137   Potassium 3.5 - 5.1 mmol/L 4.8  3.8  4.4   Chloride 98 - 111 mmol/L 96  103  103   CO2 22 - 32 mmol/L 25  25  26    Calcium 8.9 - 10.3 mg/dL 9.2  9.5  9.8     LFT    Latest Ref Rng & Units 09/10/2022    6:23 AM 10/23/2021    3:01 PM 01/24/2019   11:57 AM  Hepatic Function  Total Protein 6.5 - 8.1 g/dL 7.2  7.5  6.3   Albumin 3.5 - 5.0 g/dL 3.3  3.8  3.3   AST 15 - 41 U/L 65  31  19   ALT 0 - 44 U/L 100  30  21   Alk Phosphatase 38 - 126 U/L 69  59  65   Total Bilirubin 0.3 - 1.2 mg/dL 1.3  1.2  0.8   Bilirubin, Direct 0.0 - 0.2 mg/dL   0.1      STUDIES: US ABDOMEN LIMITED RUQ (LIVER/GB)  Result Date: 09/10/2022 CLINICAL DATA:  Right upper quadrant abdominal pain EXAM: ULTRASOUND ABDOMEN LIMITED RIGHT UPPER QUADRANT COMPARISON:  CT abdomen and pelvis dated 09/10/2022 FINDINGS: Technically challenging examination due to patient body habitus and skin sensitivity resulting in inability to tolerate contact with the ultrasound probe. Gallbladder: Not well seen. Common bile duct: Not well seen. Liver: Suboptimally evaluated. No definite focal lesion identified. Within normal limits in  parenchymal echogenicity. Portal vein is not seen. Other: None. IMPRESSION: Markedly challenging examination resulting in suboptimal evaluation as described. Electronically Signed   By: Agustin Cree M.D.   On: 09/10/2022 10:57   CT Head Wo Contrast  Result Date: 09/10/2022 CLINICAL DATA:  Head trauma, minor (Age >= 65y) EXAM: CT HEAD WITHOUT CONTRAST TECHNIQUE: Contiguous axial images were obtained from the base of the skull through the vertex without intravenous contrast. RADIATION DOSE REDUCTION: This exam was performed according to the departmental dose-optimization program which includes automated exposure control, adjustment of the mA and/or kV according to patient size and/or use of iterative reconstruction technique. COMPARISON:  CT head July 05, 2015. FINDINGS: Brain: Remote infarct in the left frontal white matter. Remote infarct in the left cerebellum. Additional patchy white matter hypodensities are nonspecific but compatible with chronic microvascular ischemic disease. No evidence of acute large vascular territory infarct, acute hemorrhage, mass lesion or midline shift. Vascular: No hyperdense vessel identified. Skull: No acute fracture. Sinuses/Orbits: Clear sinuses.  No  acute orbital findings. Other: No mastoid effusions. IMPRESSION: 1. No evidence of acute intracranial abnormality. 2. Chronic microvascular ischemic disease and remote infarcts. Electronically Signed   By: Feliberto Harts M.D.   On: 09/10/2022 10:24   CT ABDOMEN PELVIS W CONTRAST  Result Date: 09/10/2022 CLINICAL DATA:  Nausea, vomiting and weakness for 3 days. Decreased appetite. Shingles. Bowel obstruction suspected. EXAM: CT ABDOMEN AND PELVIS WITH CONTRAST TECHNIQUE: Multidetector CT imaging of the abdomen and pelvis was performed using the standard protocol following bolus administration of intravenous contrast. RADIATION DOSE REDUCTION: This exam was performed according to the departmental dose-optimization program which  includes automated exposure control, adjustment of the mA and/or kV according to patient size and/or use of iterative reconstruction technique. CONTRAST:  80mL OMNIPAQUE IOHEXOL 300 MG/ML  SOLN COMPARISON:  Noncontrast abdominopelvic CT 10/25/2021 and 06/26/2020. FINDINGS: Lower chest: Stable linear atelectasis or scarring at both lung bases. There is no pleural or pericardial effusion. Atherosclerosis of the aorta and coronary arteries noted. There are calcifications of the aortic valve. Hepatobiliary: The liver is normal in density without suspicious focal abnormality. No evidence of gallstones, gallbladder wall thickening or biliary dilatation. Pancreas: The pancreas appears stable, without acute findings. There is no pancreatic ductal dilatation. Small cystic lesions involving the pancreatic body and tail are noted, measuring 8 mm on image 37/2 and 7 mm on image 39/2, grossly stable from previous study, although more obvious on today's examination performed with contrast. Spleen: Normal in size without focal abnormality. Adrenals/Urinary Tract: Both adrenal glands appear normal. Nonobstructing bilateral renal calculi and bilateral renal cortical scarring are again noted. No evidence of ureteral calculus or hydronephrosis. There are no suspicious renal lesions. Probable renal cysts bilaterally for which no follow-up imaging is recommended. The bladder appears unremarkable for its degree of distention. Stomach/Bowel: No enteric contrast administered. There is a small hiatal hernia. There is circumferential wall thickening of the distal stomach and duodenum with mild surrounding soft tissue stranding. The distal small bowel, appendix and proximal colon appear normal. There are diverticular changes throughout the distal colon, without evidence of acute inflammation. Moderate stool throughout the colon. Vascular/Lymphatic: There are no enlarged abdominal or pelvic lymph nodes. Aortic and branch vessel atherosclerosis  without evidence of large vessel occlusion or aneurysm. The portal, superior mesenteric and splenic veins are patent. Reproductive: Stable small calcified uterine fibroids. No suspicious adnexal findings. Other: Probable pelvic floor laxity. Intact abdominal wall. Small amount of retroperitoneal fluid adjacent to the inflamed appearing duodenum. No ascites or free air. Musculoskeletal: No acute or significant osseous findings. Subacute appearing fracture of the right 8th rib laterally. Multilevel thoracolumbar spondylosis associated with a convex right lumbar scoliosis. IMPRESSION: 1. Circumferential wall thickening of the distal stomach and duodenum with mild surrounding soft tissue stranding, suspicious for peptic ulcer disease or duodenitis. No evidence of perforation, abscess or bowel obstruction. 2. Distal colonic diverticulosis without evidence of acute inflammation. Moderate stool throughout the colon suggesting constipation. 3. Nonobstructing bilateral renal calculi. No evidence of ureteral calculus or hydronephrosis. 4. Small cystic lesions involving the pancreatic body and tail, grossly stable from previous study, although more obvious on today's examination performed with contrast. 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 Consider continued follow-up with MRI in 2 years. This recommendation follows ACR consensus guidelines: Management of Incidental Pancreatic Cysts: A White Paper of the ACR Incidental Findings Committee. J Am Coll Radiol 2017;14:911-923. 5.  Aortic Atherosclerosis (ICD10-I70.0). Electronically Signed   By: Carey Bullocks M.D.   On: 09/10/2022 08:22      Impression / Plan:   [name] is a 77 y.o. female with metabolic syndrome, hypertension, history of A-fib, not on anticoagulation, currently in normal sinus rhythm, history of choledocholithiasis  s/p biliary sphincterotomy in 2020, deferred cholecystectomy, recent diagnosis of shingles on her left thorax, started on valacyclovir on 09/07/2022, also being treated for UTI at the same time  Nausea, vomiting, coffee-ground emesis, left upper quadrant pain  CT revealed possible peptic ulcer disease Her symptoms are multifactorial, secondary to prednisone, antibiotics and in setting of infection,she is already feeling better Differentials include peptic ulcer disease or erosive gastritis or duodenitis or erosive esophagitis Hemoglobin is elevated, likely secondary to dehydration BUN/creatinine elevated secondary to AKI Recommend to continue Protonix 40 mg IV twice daily as inpatient and switch to Prilosec 40 mg p.o. twice daily at the time of discharge Recommend sucralfate suspension 3 times daily as needed, can continue as outpatient Diet as tolerated and IV hydration With active shingles, no urgent indication for upper endoscopy.  Recommend EGD as outpatient after 2 weeks of treatment of shingles  Elevated AST/ALT Likely in acute setting No evidence of acute cholecystitis Monitor for now  Thank you for involving me in the care of this patient.  Patient can follow-up with me upon discharge Pt's son was bedside and both expressed understanding of my recommendations    LOS: 0 days   Lannette Donath, MD  09/10/2022, 12:06 PM    Note: This dictation was prepared with Dragon dictation along with smaller phrase technology. Any transcriptional errors that result from this process are unintentional.

## 2022-09-10 NOTE — ED Notes (Signed)
Pt up to bedside commode. 1 assist with walker. Pt back to bed. Monitor on. Family at bedside. Pt reports improvement with nausea. Advancing her diet slowly.-

## 2022-09-10 NOTE — ED Notes (Signed)
Pt up to the bedside commode. 1 assist with walker. Pt continent of urine. Pt back to bed. Monitor on. Family at bedside.

## 2022-09-10 NOTE — H&P (Signed)
History and Physical    [name] DOB: 08-25-1945 DOA: 09/10/2022  Referring MD/NP/PA:   PCP: Danella Penton, MD   Patient coming from:  The patient is coming from home.     Chief Complaint: Nausea, vomiting, fall.  HPI: [name] is a 77 y.o. female with medical history significant of hypertension, hyperlipidemia, diabetes mellitus, stroke, gout, anxiety, CKD-4, obesity, atrial fibrillation not on anticoagulants, shingles, who presents with nausea and vomiting.   Patient states that she has been sick in the past several days.  She has rash and pain left flank area, was diagnosed with shingles.  Started on prednisone and Valtrex on 4/30.  His son at the bedside reported that after starting these 2 medications, pt developed sleeplessness and vivid dreams, being emotional which her son thought may be attributed to her prednisone. During the same period of time, patient also has nausea and vomiting.  Patient has had multiple episodes of nonbilious nonbloody vomiting.  No diarrhea.  Patient denies abdominal pain at rest, but has tenderness in the right upper quadrant on palpation.  Patient has generalized weakness and dizziness, and fell twice on Wednesday night and Thursday morning, no loss of consciousness.  Denies head or neck injury.  Patient denies unilateral numbness or tinglings in extremities.  No facial droop or slurred speech.  She also reports increased urinary frequency, denies dysuria or burning or urination. Pt is currently on Cefdinir. Patient does not have chest pain, shortness breath and cough. She has chest congestion.  No fever or chills.  Patient states that because of nausea andvomiting, she could not take Valtrex.   Data reviewed independently and ED Course: pt was found to have WBC 11.8, positive urinalysis (hazy appearance, small amount of leukocyte, negative bacteria, WBC 21-50), abnormal liver function (ALP 69, AST 65, ALT 100, total  bilirubin 1.3), renal function close to baseline, temperature normal, blood pressure 158/99, heart rate 97, oxygen saturation 95% on room air.  CT of head is negative for acute intracranial issues. CT scan of abdomen/pelvis showed possible peptic ulcer disease. Patient could not tolerate RUQ-US which is a very limited test. Pt is admitted to tele bed as inpt. Dr. Allegra Lai of GI is consulted.     CT-abd/pelvis: 1. Circumferential wall thickening of the distal stomach and duodenum with mild surrounding soft tissue stranding, suspicious for peptic ulcer disease or duodenitis. No evidence of perforation, abscess or bowel obstruction. 2. Distal colonic diverticulosis without evidence of acute inflammation. Moderate stool throughout the colon suggesting constipation. 3. Nonobstructing bilateral renal calculi. No evidence of ureteral calculus or hydronephrosis. 4. Small cystic lesions involving the pancreatic body and tail, grossly stable from previous study, although more obvious on today's examination performed with contrast. 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 Consider continued follow-up with MRI in 2 years. This recommendation follows ACR consensus guidelines: Management of Incidental Pancreatic Cysts: A White Paper of the ACR Incidental Findings Committee. J Am Coll Radiol 2017;14:911-923. 5.  Aortic Atherosclerosis (ICD10-I70.0). 6. Hepatobiliary: The liver is normal in density without suspicious focal abnormality. No evidence of gallstones, gallbladder wall thickening or biliary dilatation.  US-RUQ: is a technically challenging examination due to patient body habitus and skin sensitivity resulting in inability to tolerate contact with the ultrasound probe. Suboptimally evaluated. No definite focal lesion identified. Within normal limits in parenchymal  echogenicity. Portal vein is not seen. Gallbladder  and Common bile duct are not seen well.      EKG: I have personally reviewed.  Sinus rhythm, QTc 463, right bundle blockade, Q-wave in lead III/aVF which is similar to previous EKG.   Review of Systems:   General: no fevers, chills, no body weight gain, has poor appetite, has fatigue HEENT: no blurry vision, hearing changes or sore throat Respiratory: no dyspnea, coughing, wheezing CV: no chest pain, no palpitations GI: has nausea, vomiting, abdominal pain, no diarrhea, constipation GU: no dysuria, burning on urination, has increased urinary frequency, no hematuria  Ext: no leg edema Neuro: no unilateral weakness, numbness, or tingling, no vision change or hearing loss. Has dizziness and fall Skin: has shingles in left flank area MSK: No muscle spasm, no deformity, no limitation of range of movement in spin Heme: No easy bruising.  Travel history: No recent long distant travel.   Allergy:  Allergies  Allergen Reactions   Lactose Intolerance (Gi)     Milk    Morphine And Related Nausea And Vomiting   Vicodin [Hydrocodone-Acetaminophen] Nausea And Vomiting    Tolerates acetaminophen    Past Medical History:  Diagnosis Date   A-fib (HCC)    back in 2013, for stent placement in kidney, she had a bout of a-fib.  Now, back in rhythm   Arthritis    Chronic kidney disease    Complication of anesthesia    Diabetes mellitus without complication (HCC)    type 2    only takes metformin   Dyspnea    Dysuria    History of kidney stones    Hypertension    Osteopenia    PONV (postoperative nausea and vomiting)    Sleep apnea    lost over 120 lbs, and no long uses it (close to 2 yrs now)   Stroke [facility])    mini 2003--affected right side of body.   took 6 weeks to get over.   Urine, incontinence, stress female     Past Surgical History:  Procedure Laterality Date   BREAST BIOPSY Right 11/22/2019   U/S Bx-HYDROMARK-BENIGN  INTRAMAMMARY LYMPH NODE.   COLONOSCOPY W/ POLYPECTOMY Right    COLONOSCOPY WITH PROPOFOL N/A 01/09/2015   Procedure: COLONOSCOPY WITH PROPOFOL;  Surgeon: Scot Jun, MD;  Location: [facility] ENDOSCOPY;  Service: Endoscopy;  Laterality: N/A;   ERCP N/A 03/15/2019   Procedure: ENDOSCOPIC RETROGRADE CHOLANGIOPANCREATOGRAPHY (ERCP);  Surgeon: Midge Minium, MD;  Location: [facility] ENDOSCOPY;  Service: Endoscopy;  Laterality: N/A;   EYE SURGERY  2013   bilateral cataracts with implants   JOINT REPLACEMENT     left knee  TKR   KIDNEY STONE SURGERY     40 yrs ago, while pregnant, she had to 'be cut in 1/2 to get stone out'   KNEE ARTHROPLASTY Right 11/06/2021   Procedure: COMPUTER ASSISTED TOTAL KNEE ARTHROPLASTY - DEPUY;  Surgeon: Donato Heinz, MD;  Location: [facility] ORS;  Service: Orthopedics;  Laterality: Right;   KNEE ARTHROSCOPY     left knee   LITHOTRIPSY     ltk Left    LUMBAR LAMINECTOMY/DECOMPRESSION MICRODISCECTOMY N/A 09/06/2016   Procedure: LAMINECTOMY AND FORAMINOTOMY LUMBAR THREE- LUMBAR FOUR, LUMBAR FOUR- LUMBAR FIVE;  Surgeon: Tressie Stalker, MD;  Location: [facility] OR;  Service: Neurosurgery;  Laterality: N/A;  LAMINECTOMY AND FORAMINOTOMY LUMBAR 3- LUMBAR 4, LUMBAR 4- LUMBAR 5    REVERSE SHOULDER ARTHROPLASTY Right 12/08/2020   Procedure: Right reverse total shoulder arthroplasty and  biceps tenodesis;  Surgeon: Signa Kell,  MD;  Location: [facility] ORS;  Service: Orthopedics;  Laterality: Right;   throidectomy  1979   goiter   TRIGGER FINGER RELEASE Left     Social History:  reports that she has never smoked. She has never used smokeless tobacco. She reports that she does not drink alcohol and does not use drugs.  Family History:  Family History  Problem Relation Age of Onset   Breast cancer Neg Hx      Prior to Admission medications   Medication Sig Start Date End Date Taking? Authorizing Provider  acetaminophen (TYLENOL) 500 MG tablet Take 500 mg by mouth every 6 (six) hours as  needed.    [provider]  allopurinol (ZYLOPRIM) 100 MG tablet Take 100 mg by mouth daily.    [provider]  ALPRAZolam Prudy Feeler) 0.5 MG tablet Take 0.5 mg by mouth 2 (two) times daily as needed for anxiety.    [provider]  amLODipine (NORVASC) 10 MG tablet Take 10 mg by mouth daily.  07/05/16   [provider]  aspirin EC 81 MG tablet Take 81 mg by mouth daily. Swallow whole.    [provider]  celecoxib (CELEBREX) 200 MG capsule Take 1 capsule (200 mg total) by mouth 2 (two) times daily. 11/07/21   Dedra Skeens, PA-C  Chlorpheniramine-DM 4-30 MG TABS Take by mouth.    [provider]  Cholecalciferol (VITAMIN D3) 50 MCG (2000 UT) TABS Take by mouth.    [provider]  enoxaparin (LOVENOX) 40 MG/0.4ML injection Inject 0.4 mLs (40 mg total) into the skin daily for 14 days. 11/07/21 11/21/21  Dedra Skeens, PA-C  furosemide (LASIX) 20 MG tablet Take 20 mg by mouth daily as needed for edema.    [provider]  gabapentin (NEURONTIN) 100 MG capsule Take 100-200 mg by mouth See admin instructions. Take two capsules (200 mg) every morning and one capsule (100 mg) every evening 08/22/18   [provider]  glipiZIDE (GLUCOTROL) 5 MG tablet Take 5 mg by mouth daily before breakfast.    [provider]  HYDROcodone-acetaminophen (NORCO/VICODIN) 5-325 MG tablet Take 1 tablet by mouth every 6 (six) hours as needed for moderate pain.    [provider]  loperamide (IMODIUM) 2 MG capsule Take by mouth as needed for diarrhea or loose stools.    [provider]  Melatonin 5 MG CAPS Take by mouth.    [provider]  Melatonin 5 MG CAPS Take 1 tablet by mouth at bedtime as needed for sleep.    [provider]  metFORMIN (GLUCOPHAGE) 500 MG tablet Take 1 tablet by mouth 3 (three) times daily. 06/12/21   [provider]  metoprolol (LOPRESSOR) 100 MG tablet Take 100 mg by mouth daily.      [provider]  OVER THE COUNTER MEDICATION doTerra for healthy immnune system    [provider]  oxyCODONE (OXY IR/ROXICODONE) 5 MG immediate release tablet Take 1 tablet (5 mg total) by mouth every 4 (four) hours as needed for moderate pain (pain score 4-6). 11/07/21   Dedra Skeens, PA-C  rosuvastatin (CRESTOR) 10 MG tablet Take 10 mg by mouth daily.    [provider]  Semaglutide (OZEMPIC, 0.25 OR 0.5 MG/DOSE, [place]) Inject 0.5 mg into the skin every 7 (seven) days.    [provider]  traMADol (ULTRAM) 50 MG tablet Take 1-2 tablets (50-100 mg total) by mouth every 4 (four) hours as needed for moderate pain. 11/07/21  Dedra Skeens, PA-C  vitamin B-12 (CYANOCOBALAMIN) 500 MCG tablet Take 500 mcg by mouth daily.    [provider]    Physical Exam: Vitals:   09/10/22 0618 09/10/22 1137 09/10/22 1145 09/10/22 1300  BP:   (!) 137/99 95/75  Pulse:  99 94 95  Resp:  (!) 24 12 14   Temp:      TempSrc:      SpO2:  (!) 87% 97% 95%  Weight: 90 kg     Height: 5\' 2"  (1.575 m)      General: Not in acute distress HEENT:       Eyes: PERRL, EOMI, no scleral icterus.       ENT: No discharge from the ears and nose, no pharynx injection, no tonsillar enlargement.        Neck: No JVD, no bruit, no mass felt. Heme: No neck lymph node enlargement. Cardiac: S1/S2, RRR, No murmurs, No gallops or rubs. Respiratory: No rales, wheezing, rhonchi or rubs. GI: Soft, nondistended, has mild tenderness in RUQ, no rebound pain, no organomegaly, BS present. GU: No hematuria Ext: No pitting leg edema bilaterally. 1+DP/PT pulse bilaterally. Musculoskeletal: No joint deformities, No joint redness or warmth, no limitation of ROM in spin. Skin: has shingles rash in left flank regular, rashes scabbed Neuro: Alert, oriented X3, cranial nerves II-XII grossly intact, moves all extremities normally.  Psych: Patient is not psychotic, no suicidal or hemocidal ideation.  Labs on  Admission: I have personally reviewed following labs and imaging studies  CBC: Recent Labs  Lab 09/10/22 0623  WBC 11.8*  NEUTROABS 8.9*  HGB 18.1*  HCT 53.1*  MCV 87.5  PLT 265   Basic Metabolic Panel: Recent Labs  Lab 09/10/22 0623  NA 133*  K 4.8  CL 96*  CO2 25  GLUCOSE 228*  BUN 31*  CREATININE 1.53*  CALCIUM 9.2   GFR: Estimated Creatinine Clearance: 31.6 mL/min (A) (by C-G formula based on SCr of 1.53 mg/dL (H)). Liver Function Tests: Recent Labs  Lab 09/10/22 0623  AST 65*  ALT 100*  ALKPHOS 69  BILITOT 1.3*  PROT 7.2  ALBUMIN 3.3*   No results for input(s): "LIPASE", "AMYLASE" in the last 168 hours. No results for input(s): "AMMONIA" in the last 168 hours. Coagulation Profile: No results for input(s): "INR", "PROTIME" in the last 168 hours. Cardiac Enzymes: No results for input(s): "CKTOTAL", "CKMB", "CKMBINDEX", "TROPONINI" in the last 168 hours. BNP (last 3 results) No results for input(s): "PROBNP" in the last 8760 hours. HbA1C: No results for input(s): "HGBA1C" in the last 72 hours. CBG: Recent Labs  Lab 09/10/22 1244  GLUCAP 216*   Lipid Profile: No results for input(s): "CHOL", "HDL", "LDLCALC", "TRIG", "CHOLHDL", "LDLDIRECT" in the last 72 hours. Thyroid Function Tests: No results for input(s): "TSH", "T4TOTAL", "FREET4", "T3FREE", "THYROIDAB" in the last 72 hours. Anemia Panel: No results for input(s): "VITAMINB12", "FOLATE", "FERRITIN", "TIBC", "IRON", "RETICCTPCT" in the last 72 hours. Urine analysis:    Component Value Date/Time   COLORURINE YELLOW (A) 09/10/2022 0922   APPEARANCEUR HAZY (A) 09/10/2022 0922   APPEARANCEUR Cloudy 07/28/2013 1213   LABSPEC 1.032 (H) 09/10/2022 0922   LABSPEC 1.016 07/28/2013 1213   PHURINE 5.0 09/10/2022 0922   GLUCOSEU 50 (A) 09/10/2022 0922   GLUCOSEU Negative 07/28/2013 1213   HGBUR NEGATIVE 09/10/2022 0922   BILIRUBINUR NEGATIVE 09/10/2022 0922   BILIRUBINUR 2+ 07/28/2013 1213    KETONESUR NEGATIVE 09/10/2022 0922   PROTEINUR 100 (A) 09/10/2022 1610  NITRITE NEGATIVE 09/10/2022 0922   LEUKOCYTESUR SMALL (A) 09/10/2022 0922   LEUKOCYTESUR 3+ 07/28/2013 1213   Sepsis Labs: @LABRCNTIP (procalcitonin:4,lacticidven:4) )No results found for this or any previous visit (from the past 240 hour(s)).   Radiological Exams on Admission: US ABDOMEN LIMITED RUQ (LIVER/GB)  Result Date: 09/10/2022 CLINICAL DATA:  Right upper quadrant abdominal pain EXAM: ULTRASOUND ABDOMEN LIMITED RIGHT UPPER QUADRANT COMPARISON:  CT abdomen and pelvis dated 09/10/2022 FINDINGS: Technically challenging examination due to patient body habitus and skin sensitivity resulting in inability to tolerate contact with the ultrasound probe. Gallbladder: Not well seen. Common bile duct: Not well seen. Liver: Suboptimally evaluated. No definite focal lesion identified. Within normal limits in parenchymal echogenicity. Portal vein is not seen. Other: None. IMPRESSION: Markedly challenging examination resulting in suboptimal evaluation as described. Electronically Signed   By: Agustin Cree M.D.   On: 09/10/2022 10:57   CT Head Wo Contrast  Result Date: 09/10/2022 CLINICAL DATA:  Head trauma, minor (Age >= 65y) EXAM: CT HEAD WITHOUT CONTRAST TECHNIQUE: Contiguous axial images were obtained from the base of the skull through the vertex without intravenous contrast. RADIATION DOSE REDUCTION: This exam was performed according to the departmental dose-optimization program which includes automated exposure control, adjustment of the mA and/or kV according to patient size and/or use of iterative reconstruction technique. COMPARISON:  CT head July 05, 2015. FINDINGS: Brain: Remote infarct in the left frontal white matter. Remote infarct in the left cerebellum. Additional patchy white matter hypodensities are nonspecific but compatible with chronic microvascular ischemic disease. No evidence of acute large vascular territory  infarct, acute hemorrhage, mass lesion or midline shift. Vascular: No hyperdense vessel identified. Skull: No acute fracture. Sinuses/Orbits: Clear sinuses.  No acute orbital findings. Other: No mastoid effusions. IMPRESSION: 1. No evidence of acute intracranial abnormality. 2. Chronic microvascular ischemic disease and remote infarcts. Electronically Signed   By: Feliberto Harts M.D.   On: 09/10/2022 10:24   CT ABDOMEN PELVIS W CONTRAST  Result Date: 09/10/2022 CLINICAL DATA:  Nausea, vomiting and weakness for 3 days. Decreased appetite. Shingles. Bowel obstruction suspected. EXAM: CT ABDOMEN AND PELVIS WITH CONTRAST TECHNIQUE: Multidetector CT imaging of the abdomen and pelvis was performed using the standard protocol following bolus administration of intravenous contrast. RADIATION DOSE REDUCTION: This exam was performed according to the departmental dose-optimization program which includes automated exposure control, adjustment of the mA and/or kV according to patient size and/or use of iterative reconstruction technique. CONTRAST:  80mL OMNIPAQUE IOHEXOL 300 MG/ML  SOLN COMPARISON:  Noncontrast abdominopelvic CT 10/25/2021 and 06/26/2020. FINDINGS: Lower chest: Stable linear atelectasis or scarring at both lung bases. There is no pleural or pericardial effusion. Atherosclerosis of the aorta and coronary arteries noted. There are calcifications of the aortic valve. Hepatobiliary: The liver is normal in density without suspicious focal abnormality. No evidence of gallstones, gallbladder wall thickening or biliary dilatation. Pancreas: The pancreas appears stable, without acute findings. There is no pancreatic ductal dilatation. Small cystic lesions involving the pancreatic body and tail are noted, measuring 8 mm on image 37/2 and 7 mm on image 39/2, grossly stable from previous study, although more obvious on today's examination performed with contrast. Spleen: Normal in size without focal abnormality.  Adrenals/Urinary Tract: Both adrenal glands appear normal. Nonobstructing bilateral renal calculi and bilateral renal cortical scarring are again noted. No evidence of ureteral calculus or hydronephrosis. There are no suspicious renal lesions. Probable renal cysts bilaterally for which no follow-up imaging is recommended. The bladder appears unremarkable for its  degree of distention. Stomach/Bowel: No enteric contrast administered. There is a small hiatal hernia. There is circumferential wall thickening of the distal stomach and duodenum with mild surrounding soft tissue stranding. The distal small bowel, appendix and proximal colon appear normal. There are diverticular changes throughout the distal colon, without evidence of acute inflammation. Moderate stool throughout the colon. Vascular/Lymphatic: There are no enlarged abdominal or pelvic lymph nodes. Aortic and branch vessel atherosclerosis without evidence of large vessel occlusion or aneurysm. The portal, superior mesenteric and splenic veins are patent. Reproductive: Stable small calcified uterine fibroids. No suspicious adnexal findings. Other: Probable pelvic floor laxity. Intact abdominal wall. Small amount of retroperitoneal fluid adjacent to the inflamed appearing duodenum. No ascites or free air. Musculoskeletal: No acute or significant osseous findings. Subacute appearing fracture of the right 8th rib laterally. Multilevel thoracolumbar spondylosis associated with a convex right lumbar scoliosis. IMPRESSION: 1. Circumferential wall thickening of the distal stomach and duodenum with mild surrounding soft tissue stranding, suspicious for peptic ulcer disease or duodenitis. No evidence of perforation, abscess or bowel obstruction. 2. Distal colonic diverticulosis without evidence of acute inflammation. Moderate stool throughout the colon suggesting constipation. 3. Nonobstructing bilateral renal calculi. No evidence of ureteral calculus or  hydronephrosis. 4. Small cystic lesions involving the pancreatic body and tail, grossly stable from previous study, although more obvious on today's examination performed with contrast. 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 Consider continued follow-up with MRI in 2 years. This recommendation follows ACR consensus guidelines: Management of Incidental Pancreatic Cysts: A White Paper of the ACR Incidental Findings Committee. J Am Coll Radiol 2017;14:911-923. 5.  Aortic Atherosclerosis (ICD10-I70.0). Electronically Signed   By: Carey Bullocks M.D.   On: 09/10/2022 08:22      Assessment/Plan Principal Problem:   PUD (peptic ulcer disease) Active Problems:   Elevated LFTs   UTI (urinary tract infection)   Atrial fibrillation, chronic (HCC)   Myocardial injury   Benign essential hypertension   CKD (chronic kidney disease) stage 4, GFR 15-29 ml/min (HCC)   History of CVA (cerebrovascular accident)   HLD (hyperlipidemia)   Type II diabetes mellitus with renal manifestations (HCC)   Shingles   Fall at home, initial encounter   Pancreatic cyst   Gout   Anxiety   Obesity (BMI 30-39.9)   Assessment and Plan:  PUD (peptic ulcer disease): CT scan of abdomen/pelvis showed possible peptic ulcer disease. Consulted Dr. Allegra Lai of GI. Since pt has active shingles, Dr. Allegra Lai will plan outpt EGD.   -Will admit to tele bed as inpt -hold ASA -avoid NSAIDs -IV Protonix 40 mg twice daily -Carafate suspension, 1 g 3 times daily  Elevated LFTs: Etiology is not clear.  Patient denies abdominal pain at rest, but has mild tenderness in right upper quadrant on palpation.  Patient could not tolerate right upper quadrant ultrasound.  CT scan abdomen/pelvis showed normal in liver density without suspicious focal abnormality, no evidence of gallstones, gallbladder wall thickening or  biliary dilatation. Pt will be on zosyn which is also for UTI -IV Zosyn -Follow-up of blood culture -Check Tylenol level -Check hepatitis panel -Repeat liver function pneumonia  UTI (urinary tract infection) -Switch to IV Zosyn -Follow-up for urine culture  Atrial fibrillation, chronic (HCC): Not on anticoagulants.  Currently sinus rhythm.  Heart rate 97 -Metoprolol  Myocardial injury: Troponin level 18 --> 18, no chest pain -Hold aspirin due to PUD -  Continue Crestor  Benign essential hypertension -IV hydralazine as needed -Metoprolol, amlodipine  CKD (chronic kidney disease) stage 4, GFR 15-29 ml/min (HCC): Stable.  Baseline creatinine 1.2-1.6.  Her creatinine is 1.53, BUN 31, GFR 35 -Follow-up with BMP  History of CVA (cerebrovascular accident) -Hold aspirin -Crestor  HLD (hyperlipidemia) -Crestor  Type II diabetes mellitus with renal manifestations (HCC): Recent A1c 6.7, well-controlled.  Patient still taking metformin, glipizide and Ozempic at home -SSI  Shingles -Valtrex  Fall at home, initial encounter: Denies injury.  No focal neurodeficit on physical examination.  CT head negative. 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 -need to f/u with PCP  Gout -Allopurinol  Anxiety -As needed Xanax  Obesity (BMI 30-39.9): Body weight 90 kg, BMI 36.29 -Encourage losing weight -Exercise and healthy diet     DVT ppx: SCD  Code Status: Full code  Family Communication:   Yes, patient's son  at bed side.    Disposition Plan:  Anticipate discharge back to previous environment  Consults called: Dr. Allegra Lai of GI  Admission status and Level of care: Telemetry Medical:   as inpt       Dispo: The patient is from: Home              Anticipated d/c is to: Home               Anticipated d/c date is: 2 days              Patient currently is not medically stable to d/c.    Severity of Illness:  The appropriate patient status for this patient is INPATIENT. Inpatient status is judged to be reasonable and necessary in order to provide the required intensity of service to ensure the patient's safety. The patient's presenting symptoms, physical exam findings, and initial radiographic and laboratory data in the context of their chronic comorbidities is felt to place them at high risk for further clinical deterioration. Furthermore, it is not anticipated that the patient will be medically stable for discharge from the hospital within 2 midnights of admission.   * I certify that at the point of admission it is my clinical judgment that the patient will require inpatient hospital care spanning beyond 2 midnights from the point of admission due to high intensity of service, high risk for further deterioration and high frequency of surveillance required.*       Date of Service 09/10/2022    Lorretta Harp [facility]   If 7PM-7AM, please contact night-coverage [email or web address] 09/10/2022, 2:07 PM

## 2022-09-11 ENCOUNTER — Encounter: Payer: Self-pay | Admitting: Internal Medicine

## 2022-09-11 DIAGNOSIS — B029 Zoster without complications: Secondary | ICD-10-CM

## 2022-09-11 DIAGNOSIS — E860 Dehydration: Secondary | ICD-10-CM | POA: Diagnosis not present

## 2022-09-11 DIAGNOSIS — K279 Peptic ulcer, site unspecified, unspecified as acute or chronic, without hemorrhage or perforation: Secondary | ICD-10-CM | POA: Diagnosis not present

## 2022-09-11 DIAGNOSIS — N3000 Acute cystitis without hematuria: Secondary | ICD-10-CM | POA: Diagnosis not present

## 2022-09-11 LAB — GLUCOSE, CAPILLARY
Glucose-Capillary: 145 mg/dL — ABNORMAL HIGH (ref 70–99)
Glucose-Capillary: 141 mg/dL — ABNORMAL HIGH (ref 70–99)

## 2022-09-11 LAB — CBG MONITORING, ED
Glucose-Capillary: 152 mg/dL — ABNORMAL HIGH (ref 70–99)
Glucose-Capillary: 172 mg/dL — ABNORMAL HIGH (ref 70–99)

## 2022-09-11 LAB — CBC
HCT: 44.1 % (ref 36.0–46.0)
Hemoglobin: 14.8 g/dL (ref 12.0–15.0)
MCH: 30 pg (ref 26.0–34.0)
MCHC: 33.6 g/dL (ref 30.0–36.0)
MCV: 89.3 fL (ref 80.0–100.0)
Platelets: 206 10*3/uL (ref 150–400)
RBC: 4.94 MIL/uL (ref 3.87–5.11)
RDW: 14.3 % (ref 11.5–15.5)
WBC: 8.1 10*3/uL (ref 4.0–10.5)
nRBC: 0 % (ref 0.0–0.2)

## 2022-09-11 LAB — COMPREHENSIVE METABOLIC PANEL
ALT: 52 U/L — ABNORMAL HIGH (ref 0–44)
AST: 32 U/L (ref 15–41)
Albumin: 2.4 g/dL — ABNORMAL LOW (ref 3.5–5.0)
Alkaline Phosphatase: 43 U/L (ref 38–126)
Anion gap: 6 (ref 5–15)
BUN: 27 mg/dL — ABNORMAL HIGH (ref 8–23)
CO2: 22 mmol/L (ref 22–32)
Calcium: 8.1 mg/dL — ABNORMAL LOW (ref 8.9–10.3)
Chloride: 104 mmol/L (ref 98–111)
Creatinine, Ser: 1.56 mg/dL — ABNORMAL HIGH (ref 0.44–1.00)
GFR, Estimated: 34 mL/min — ABNORMAL LOW (ref >60)
Glucose, Bld: 122 mg/dL — ABNORMAL HIGH (ref 70–99)
Potassium: 4.5 mmol/L (ref 3.5–5.1)
Sodium: 132 mmol/L — ABNORMAL LOW (ref 135–145)
Total Bilirubin: 1.5 mg/dL — ABNORMAL HIGH (ref 0.3–1.2)
Total Protein: 5.1 g/dL — ABNORMAL LOW (ref 6.5–8.1)

## 2022-09-11 LAB — URINE CULTURE

## 2022-09-11 MED ORDER — PANTOPRAZOLE SODIUM 40 MG PO TBEC
40.0000 mg | DELAYED_RELEASE_TABLET | Freq: Two times a day (BID) | ORAL | Status: DC
  Administered 2022-09-11 – 2022-09-12 (×3): 40 mg via ORAL
  Filled 2022-09-11 (×3): qty 1

## 2022-09-11 MED ORDER — CEFDINIR 300 MG PO CAPS
300.0000 mg | ORAL_CAPSULE | Freq: Two times a day (BID) | ORAL | Status: DC
  Administered 2022-09-11 – 2022-09-12 (×2): 300 mg via ORAL
  Filled 2022-09-11 (×3): qty 1

## 2022-09-11 NOTE — ED Notes (Signed)
Advised nurse that patient has readfy bed

## 2022-09-11 NOTE — Evaluation (Signed)
Occupational Therapy Evaluation Patient Details Name: [name] MRN: [id number] DOB: 1946-04-17 Today's Date: 09/11/2022   History of Present Illness [name] is a 77 y/o F with PMH including DM, HTN, CVA, gout, anxiety, CKD, TKA (2023 and 2022), R shoulder surgery. Pt admitted with AMS, N/V, s/p 2 falls this week; is taking medication for shingles.   Clinical Impression   Patient received for OT evaluation. See flowsheet below for details of function. Generally, patient requiring MIN A for bed mobility, unable to perform functional mobility 2/2 no RW available (did stand with handheld; unsteady in sidestepping), and set up- MIN A for ADLs. Patient will benefit from continued OT while in acute care.       Recommendations for follow up therapy are one component of a multi-disciplinary discharge planning process, led by the attending physician.  Recommendations may be updated based on patient status, additional functional criteria and insurance authorization.   Assistance Recommended at Discharge Intermittent Supervision/Assistance  Patient can return home with the following A little help with bathing/dressing/bathroom;A little help with walking and/or transfers;Assistance with cooking/housework;Assist for transportation;Help with stairs or ramp for entrance    Functional Status Assessment  Patient has had a recent decline in their functional status and demonstrates the ability to make significant improvements in function in a reasonable and predictable amount of time.  Equipment Recommendations  None recommended by OT    Recommendations for Other Services       Precautions / Restrictions Precautions Precautions: Fall;Other (comment) Precaution Comments: airborne and contact precautions for shingles Restrictions Weight Bearing Restrictions: No      Mobility Bed Mobility Overal bed mobility: Needs Assistance Bed Mobility: Sit to Supine       Sit to supine:  Min assist   General bed mobility comments: Pt does not t/f to a bed at home; uses lift recliner for sleeping    Transfers Overall transfer level: Needs assistance Equipment used: 1 person hand held assist Transfers: Sit to/from Stand Sit to Stand: Min guard           General transfer comment: Pt relying heavily on OT's hand and bed rail for standing balance; decreased balance in sidesteps; not able to let go of bed rail for unilateral support only. Did not have access to rollator or RW at this time, so unable to fully assess mobility in the room. Pt had just transferred with handheld assist to the [facility] with nursing prior to OT arrival.      Balance Overall balance assessment: Needs assistance Sitting-balance support: Feet supported Sitting balance-Leahy Scale: Normal     Standing balance support: Bilateral upper extremity supported Standing balance-Leahy Scale: Fair                             ADL either performed or assessed with clinical judgement   ADL Overall ADL's : Needs assistance/impaired                                       General ADL Comments: Patient is likely approaching functional baseline; didn't have rollator or RW in room, so difficult to fully assess. Pt usually stands at the sink for grooming and unable to stand today unless with BIL UE support for balance, so slight change from baseline. Able to lean forward to touch toes, demonstrating simulated LB dressing.  Recommend continued  use of shower chair.     Vision Patient Visual Report: No change from baseline       Perception     Praxis      Pertinent Vitals/Pain Pain Assessment Pain Assessment: No/denies pain     Hand Dominance Right   Extremity/Trunk Assessment Upper Extremity Assessment Upper Extremity Assessment: Overall WFL for tasks assessed (UE ROM WFL. RUE slightly decreased AROM since shoulder surgery. LUE slight weakness (3+/5).)   Lower Extremity  Assessment Lower Extremity Assessment: Generalized weakness;Defer to PT evaluation       Communication Communication Communication: No difficulties   Cognition Arousal/Alertness: Awake/alert Behavior During Therapy: WFL for tasks assessed/performed Overall Cognitive Status: Within Functional Limits for tasks assessed                                 General Comments: oriented, pleasant, verbose.     General Comments  Pt on room air    Exercises     Shoulder Instructions      Home Living Family/patient expects to be discharged to:: Private residence Living Arrangements: Spouse/significant other Available Help at Discharge: Family;Available PRN/intermittently Type of Home: House Home Access: Stairs to enter [employer] of Steps: 3-4 Entrance Stairs-Rails: Right Home Layout: One level     Bathroom Shower/Tub: Producer, television/film/video: Handicapped height Bathroom Accessibility: No (not accessible via rollator)   Home Equipment: Rollator (4 wheels);Shower seat;BSC/3in1;Grab bars - toilet;Grab bars - tub/shower;Other (comment) (3 wheeled walker, lift recliner)   Additional Comments: Pt sleeps in lift chair      Prior Functioning/Environment Prior Level of Function : Independent/Modified Independent;History of Falls (last six months)             Mobility Comments: MOD (I) with rollator in the home, 3 wheeled walker in the community; endorses an average of 1 fall per month (corroborated with daughter); no serious injuries since fall with shoulder surgery a year or two ago. ADLs Comments: Pt's husband does the driving. They order groceries or get takeout. Share laundry and cooking responsibilities. Pt is (I) with DME for ADLs. Cannot fix walker into bathroom, so holds on to sink. Only one fall in bathroom. Pt endorses that falls are typically when she is doing something risky such as leaning forward with a reacher or adjusting robot vacuum  on floor.        OT Problem List: Decreased strength;Decreased activity tolerance;Impaired balance (sitting and/or standing)      OT Treatment/Interventions: Self-care/ADL training;Therapeutic exercise;Therapeutic activities    OT Goals(Current goals can be found in the care plan section) Acute Rehab OT Goals Patient Stated Goal: Get better; go home OT Goal Formulation: With patient/family Time For Goal Achievement: 09/25/22 Potential to Achieve Goals: Good ADL Goals Pt Will Perform Lower Body Dressing: with modified independence;with adaptive equipment Pt Will Transfer to Toilet: with modified independence;grab bars;ambulating Pt Will Perform Toileting - Clothing Manipulation and hygiene: with modified independence;with adaptive equipment;sit to/from stand Pt Will Perform Tub/Shower Transfer: Shower transfer;ambulating;grab bars  OT Frequency: Min 2X/week    Co-evaluation              AM-PAC OT "6 Clicks" Daily Activity     Outcome Measure Help from another person eating meals?: None Help from another person taking care of personal grooming?: A Little Help from another person toileting, which includes using toliet, bedpan, or urinal?: A Little Help from another person bathing (including  washing, rinsing, drying)?: A Little Help from another person to put on and taking off regular upper body clothing?: None Help from another person to put on and taking off regular lower body clothing?: A Little 6 Click Score: 20   End of Session Nurse Communication: Mobility status  Activity Tolerance: Patient tolerated treatment well Patient left: in bed;with call bell/phone within reach;with family/visitor present;Other (comment) (transport in room to take pt to room on another floor)  OT Visit Diagnosis: Unsteadiness on feet (R26.81);Repeated falls (R29.6)                Time: 4098-1191 OT Time Calculation (min): 21 min Charges:  OT General Charges $OT Visit: 1 Visit OT  Evaluation $OT Eval Moderate Complexity: 1 Mod  Alyssamae Klinck Junie Panning, MS, OTR/L  Alvester Morin 09/11/2022, 4:13 PM

## 2022-09-11 NOTE — Progress Notes (Signed)
[facility]  - [place] [facility]   PATIENT NAME: [name]    MR#:  [id number]  DATE OF BIRTH:  Feb 25, 1946  SUBJECTIVE:  no family at bedside during my evaluation. Patient came in with nausea vomiting recently diagnosed with shingles. Took two doses of prednisone and became very altered according to son Sharl Ma on the phone. She could not remember anything and they brought her to the hospital. Patient had a small nontraumatic fall at home. Patient feels lot better today. Tolerating PO diet. Pain around the shingles area is improving.  VITALS:  Blood pressure (!) 140/90, pulse 85, temperature 98 F (36.7 C), temperature source Oral, resp. rate 18, height 5\' 2"  (1.575 m), weight 90 kg, SpO2 98 %.  PHYSICAL EXAMINATION:   GENERAL:  77 y.o.-year-old patient with no acute distress.  LUNGS: Normal breath sounds bilaterally, no wheezing CARDIOVASCULAR: S1, S2 normal. No murmur   ABDOMEN: Soft, nontender, nondistended. Bowel sounds present. Shingles over the left side of the abdomen EXTREMITIES: No  edema b/l.    NEUROLOGIC: nonfocal  patient is alert and awake SKIN: as above shingles rash  LABORATORY PANEL:  CBC Recent Labs  Lab 09/11/22 0619  WBC 8.1  HGB 14.8  HCT 44.1  PLT 206    Chemistries  Recent Labs  Lab 09/11/22 0619  NA 132*  K 4.5  CL 104  CO2 22  GLUCOSE 122*  BUN 27*  CREATININE 1.56*  CALCIUM 8.1*  AST 32  ALT 52*  ALKPHOS 43  BILITOT 1.5*   Cardiac Enzymes No results for input(s): "TROPONINI" in the last 168 hours. RADIOLOGY:  US ABDOMEN LIMITED RUQ (LIVER/GB)  Result Date: 09/10/2022 CLINICAL DATA:  Right upper quadrant abdominal pain EXAM: ULTRASOUND ABDOMEN LIMITED RIGHT UPPER QUADRANT COMPARISON:  CT abdomen and pelvis dated 09/10/2022 FINDINGS: Technically challenging examination due to patient body habitus and skin sensitivity resulting in inability to tolerate contact with the ultrasound probe. Gallbladder: Not well  seen. Common bile duct: Not well seen. Liver: Suboptimally evaluated. No definite focal lesion identified. Within normal limits in parenchymal echogenicity. Portal vein is not seen. Other: None. IMPRESSION: Markedly challenging examination resulting in suboptimal evaluation as described. Electronically Signed   By: Agustin Cree M.D.   On: 09/10/2022 10:57   CT Head Wo Contrast  Result Date: 09/10/2022 CLINICAL DATA:  Head trauma, minor (Age >= 65y) EXAM: CT HEAD WITHOUT CONTRAST TECHNIQUE: Contiguous axial images were obtained from the base of the skull through the vertex without intravenous contrast. RADIATION DOSE REDUCTION: This exam was performed according to the departmental dose-optimization program which includes automated exposure control, adjustment of the mA and/or kV according to patient size and/or use of iterative reconstruction technique. COMPARISON:  CT head July 05, 2015. FINDINGS: Brain: Remote infarct in the left frontal white matter. Remote infarct in the left cerebellum. Additional patchy white matter hypodensities are nonspecific but compatible with chronic microvascular ischemic disease. No evidence of acute large vascular territory infarct, acute hemorrhage, mass lesion or midline shift. Vascular: No hyperdense vessel identified. Skull: No acute fracture. Sinuses/Orbits: Clear sinuses.  No acute orbital findings. Other: No mastoid effusions. IMPRESSION: 1. No evidence of acute intracranial abnormality. 2. Chronic microvascular ischemic disease and remote infarcts. Electronically Signed   By: Feliberto Harts M.D.   On: 09/10/2022 10:24   CT ABDOMEN PELVIS W CONTRAST  Result Date: 09/10/2022 CLINICAL DATA:  Nausea, vomiting and weakness for 3 days. Decreased appetite. Shingles. Bowel obstruction suspected. EXAM: CT ABDOMEN  AND PELVIS WITH CONTRAST TECHNIQUE: Multidetector CT imaging of the abdomen and pelvis was performed using the standard protocol following bolus administration of  intravenous contrast. RADIATION DOSE REDUCTION: This exam was performed according to the departmental dose-optimization program which includes automated exposure control, adjustment of the mA and/or kV according to patient size and/or use of iterative reconstruction technique. CONTRAST:  80mL OMNIPAQUE IOHEXOL 300 MG/ML  SOLN COMPARISON:  Noncontrast abdominopelvic CT 10/25/2021 and 06/26/2020. FINDINGS: Lower chest: Stable linear atelectasis or scarring at both lung bases. There is no pleural or pericardial effusion. Atherosclerosis of the aorta and coronary arteries noted. There are calcifications of the aortic valve. Hepatobiliary: The liver is normal in density without suspicious focal abnormality. No evidence of gallstones, gallbladder wall thickening or biliary dilatation. Pancreas: The pancreas appears stable, without acute findings. There is no pancreatic ductal dilatation. Small cystic lesions involving the pancreatic body and tail are noted, measuring 8 mm on image 37/2 and 7 mm on image 39/2, grossly stable from previous study, although more obvious on today's examination performed with contrast. Spleen: Normal in size without focal abnormality. Adrenals/Urinary Tract: Both adrenal glands appear normal. Nonobstructing bilateral renal calculi and bilateral renal cortical scarring are again noted. No evidence of ureteral calculus or hydronephrosis. There are no suspicious renal lesions. Probable renal cysts bilaterally for which no follow-up imaging is recommended. The bladder appears unremarkable for its degree of distention. Stomach/Bowel: No enteric contrast administered. There is a small hiatal hernia. There is circumferential wall thickening of the distal stomach and duodenum with mild surrounding soft tissue stranding. The distal small bowel, appendix and proximal colon appear normal. There are diverticular changes throughout the distal colon, without evidence of acute inflammation. Moderate stool  throughout the colon. Vascular/Lymphatic: There are no enlarged abdominal or pelvic lymph nodes. Aortic and branch vessel atherosclerosis without evidence of large vessel occlusion or aneurysm. The portal, superior mesenteric and splenic veins are patent. Reproductive: Stable small calcified uterine fibroids. No suspicious adnexal findings. Other: Probable pelvic floor laxity. Intact abdominal wall. Small amount of retroperitoneal fluid adjacent to the inflamed appearing duodenum. No ascites or free air. Musculoskeletal: No acute or significant osseous findings. Subacute appearing fracture of the right 8th rib laterally. Multilevel thoracolumbar spondylosis associated with a convex right lumbar scoliosis. IMPRESSION: 1. Circumferential wall thickening of the distal stomach and duodenum with mild surrounding soft tissue stranding, suspicious for peptic ulcer disease or duodenitis. No evidence of perforation, abscess or bowel obstruction. 2. Distal colonic diverticulosis without evidence of acute inflammation. Moderate stool throughout the colon suggesting constipation. 3. Nonobstructing bilateral renal calculi. No evidence of ureteral calculus or hydronephrosis. 4. Small cystic lesions involving the pancreatic body and tail, grossly stable from previous study, although more obvious on today's examination performed with contrast. 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 Consider continued follow-up with MRI in 2 years. This recommendation follows ACR consensus guidelines: Management of Incidental Pancreatic Cysts: A White Paper of the ACR Incidental Findings Committee. J Am Coll Radiol 2017;14:911-923. 5.  Aortic Atherosclerosis (ICD10-I70.0). Electronically Signed   By: Carey Bullocks M.D.   On: 09/10/2022 08:22    Assessment and Plan  Qiana Ainslie League is a 77 y.o. female with  medical history significant of hypertension, hyperlipidemia, diabetes mellitus, stroke, gout, anxiety, CKD-4, obesity, atrial fibrillation not on anticoagulants, shingles, who presents with nausea and vomiting.   His son at the bedside reported  that after starting these 2 medications, pt developed sleeplessness and vivid dreams, being emotional which her son thought may be attributed to her prednisone.  She also reports increased urinary frequency, denies dysuria or burning or urination. Pt is currently on Cefdinir.   CT of head is negative for acute intracranial issues.  CT scan of abdomen/pelvis showed possible peptic ulcer disease. Patient could not tolerate RUQ-US which is a very limited test. Dr Allegra Lai of GI is consulted.  PUD (peptic ulcer disease): CT scan of abdomen/pelvis showed possible peptic ulcer disease. Consulted Dr. Allegra Lai of GI. Since pt has active shingles, Dr. Allegra Lai will plan outpt EGD.   -hold ASA -avoid NSAIDs -IV Protonix 40 mg twice daily -Carafate suspension, 1 g 3 times daily -- no vomiting today. Overall feels better tolerating PO diet.   Elevated LFTs: Etiology is not clear.  Patient denies abdominal pain at rest, but has mild tenderness in right upper quadrant on palpation.   -- Could be attributed by shingles as well. Enzymes trending down  UTI (urinary tract infection) -resume outpatient oral antibiotic cefdinir -Follow-up for urine culture-- 80K GNR   Atrial fibrillation, chronic (HCC): Not on anticoagulants.  Currently sinus rhythm.  Heart rate 97 -Metoprolol   Myocardial injury: Troponin level 18 --> 18, no chest pain -Hold aspirin due to PUD -Continue Crestor   Benign essential hypertension -IV hydralazine as needed -Metoprolol, amlodipine   CKD (chronic kidney disease) stage 4, GFR 15-29 ml/min (HCC): Stable.  Baseline creatinine 1.2-1.6.  Her creatinine is 1.53, BUN 31, GFR 35 -Follow-up with BMP   History of CVA (cerebrovascular accident) -Hold  aspirin -Crestor   HLD (hyperlipidemia) -Crestor   Type II diabetes mellitus with renal manifestations (HCC): Recent A1c 6.7, well-controlled.  Patient still taking metformin, glipizide and Ozempic at home -SSI   Shingles -Valtrex --could not tolerate Prednisone --cont gabapentin   Fall at home, initial encounter: Denies injury.  No focal neurodeficit on physical examination.  CT head negative. 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 -need to f/u with PCP   Anxiety -As needed Xanax   Obesity (BMI 30-39.9): Body weight 90 kg, BMI 36.29 -Encourage losing weight -Exercise and healthy diet    DVT ppx: SCD  Code Status: Full code  Family Communication:   Yes, patient's son on the phone  Consults :GI Level of care: Telemetry Medical Status is: Inpatient Remains inpatient appropriate because: PUD, shingles    TOTAL TIME TAKING CARE OF THIS PATIENT: 35 minutes.  >50% time spent on counselling and coordination of care  Note: This dictation was prepared with Dragon dictation along with smaller phrase technology. Any transcriptional errors that result from this process are unintentional.  Enedina Finner M.D    [facility]   CC: Primary care physician; Danella Penton, MD

## 2022-09-12 LAB — GLUCOSE, CAPILLARY
Glucose-Capillary: 129 mg/dL — ABNORMAL HIGH (ref 70–99)
Glucose-Capillary: 194 mg/dL — ABNORMAL HIGH (ref 70–99)

## 2022-09-12 MED ORDER — CIPROFLOXACIN HCL 750 MG PO TABS: 750.0000 mg | ORAL_TABLET | Freq: Two times a day (BID) | ORAL | 0 refills | Status: AC

## 2022-09-12 MED ORDER — PANTOPRAZOLE SODIUM 40 MG PO TBEC: 40.0000 mg | DELAYED_RELEASE_TABLET | Freq: Every day | ORAL | 0 refills | Status: DC

## 2022-09-12 MED ORDER — CIPROFLOXACIN HCL 500 MG PO TABS
750.0000 mg | ORAL_TABLET | Freq: Two times a day (BID) | ORAL | Status: DC
  Administered 2022-09-12: 750 mg via ORAL
  Filled 2022-09-12: qty 2

## 2022-09-12 NOTE — Progress Notes (Signed)
This patient was seen by Dr. Allegra Lai on admission and was recommended to follow-up as an outpatient.  From the patient's hospital course it appears that her AST and ALT have improved and as of yesterday was tolerating a diet.  Dr. Verdis Prime recommendations were for the patient to follow-up with her as an outpatient.  Nothing further to do from a GI point of view.  I will sign off.  Please call if any further GI concerns or questions.  We would like to thank you for the opportunity to participate in the care of [name].

## 2022-09-12 NOTE — Discharge Summary (Signed)
Physician Discharge Summary   Patient: [name] MRN: [id number] DOB: June 06, 1945  Admit date:     09/10/2022  Discharge date: 09/12/22  Discharge Physician: Enedina Finner   PCP: Danella Penton, MD   Recommendations at discharge:   follow-up Dr. Allegra Lai for peptic ulcer disease workup in 2 to 3 weeks follow-up Dr. Bethann Punches in 1 to 2 weeks  Discharge Diagnoses: Principal Problem:   PUD (peptic ulcer disease) Active Problems:   Elevated LFTs   UTI (urinary tract infection)   Atrial fibrillation, chronic (HCC)   Myocardial injury   Benign essential hypertension   CKD (chronic kidney disease) stage 4, GFR 15-29 ml/min (HCC)   History of CVA (cerebrovascular accident)   HLD (hyperlipidemia)   Type II diabetes mellitus with renal manifestations (HCC)   Shingles   Fall at home, initial encounter   Pancreatic cyst   Gout   Anxiety   Obesity (BMI 30-39.9)   Dehydration  [name] is a 77 y.o. female with medical history significant of hypertension, hyperlipidemia, diabetes mellitus, stroke, gout, anxiety, CKD-4, obesity, atrial fibrillation not on anticoagulants, shingles, who presents with nausea and vomiting.   His son at the bedside reported that after starting these 2 medications, pt developed sleeplessness and vivid dreams, being emotional which her son thought may be attributed to her prednisone.  She also reports increased urinary frequency, denies dysuria or burning or urination. Pt is currently on Cefdinir.    CT of head is negative for acute intracranial issues.  CT scan of abdomen/pelvis showed possible peptic ulcer disease. Patient could not tolerate RUQ-US which is a very limited test. Dr Allegra Lai of GI is consulted.   PUD (peptic ulcer disease): CT scan of abdomen/pelvis showed possible peptic ulcer disease. Consulted Dr. Allegra Lai of GI. Since pt has active shingles, Dr. Allegra Lai will plan outpt EGD.   -hold ASA -avoid NSAIDs -IV Protonix 40 mg twice  daily--change to po -Carafate suspension, 1 g 3 times daily -- no vomiting today. Overall feels better tolerating PO diet. -- Patient follow-up G.I. as outpatient   Elevated LFTs: Etiology is not clear.  Patient denies abdominal pain at rest, but has mild tenderness in right upper quadrant on palpation.   -- Could be attributed by shingles as well. Enzymes trending down   UTI (urinary tract infection) -was noted on outpatient oral antibiotic cefdinir -Follow-up for urine culture-- 80K GNR--pseudomonas--change to cipro for 7 days (d/w RPH)   Atrial fibrillation, chronic (HCC): Not on anticoagulants.  Currently sinus rhythm.  Heart rate 97 -Metoprolol   Myocardial injury: Troponin level 18 --> 18, no chest pain -- will resume EC aspirin -Continue Crestor   Benign essential hypertension -IV hydralazine as needed -Metoprolol, amlodipine   CKD (chronic kidney disease) stage 4, GFR 15-29 ml/min (HCC): Stable.  Baseline creatinine 1.2-1.6.  Her creatinine is 1.53   History of CVA (cerebrovascular accident) -EC aspirin -Crestor   HLD (hyperlipidemia) -Crestor   Type II diabetes mellitus with renal manifestations (HCC): Recent A1c 6.7, well-controlled.  Patient still taking metformin, glipizide and Ozempic at home -SSI   Shingles -Valtrex --could not tolerate Prednisone --cont gabapentin   Fall at home, initial encounter: Denies injury.  No focal neurodeficit on physical examination.  CT head negative. 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 -need to f/u with PCP   Anxiety -As needed Xanax   Obesity (BMI 30-39.9): Body weight 90 kg, BMI 36.29 -Encourage losing weight -Exercise and healthy diet  Overall improving. Tolerating PO diet. Will discharge to home  with outpatient follow-up PCP and G.I. patient agreeable. Family at bedside agreeable    DVT ppx: SCD  Code Status: Full code  Family Communication:   Yes, patient's son at bedside       Consultants: G.I. Dr. Allegra Lai Disposition: Home health  Cardiac and Carb modified diet DISCHARGE MEDICATION: Allergies as of 09/12/2022       Reactions   Lactose Intolerance (gi)    Milk    Morphine And Related Nausea And Vomiting   Vicodin [hydrocodone-acetaminophen] Nausea And Vomiting   Tolerates acetaminophen        Medication List     STOP taking these medications    cefdinir 300 MG capsule Commonly known as: OMNICEF   celecoxib 200 MG capsule Commonly known as: CELEBREX   enoxaparin 40 MG/0.4ML injection Commonly known as: LOVENOX   HYDROcodone-acetaminophen 5-325 MG tablet Commonly known as: NORCO/VICODIN   predniSONE 10 MG tablet Commonly known as: DELTASONE   traMADol 50 MG tablet Commonly known as: ULTRAM       TAKE these medications    allopurinol 100 MG tablet Commonly known as: ZYLOPRIM Take 100 mg by mouth daily.   ALPRAZolam 0.5 MG tablet Commonly known as: XANAX Take 0.5 mg by mouth 2 (two) times daily as needed for anxiety.   amLODipine 10 MG tablet Commonly known as: NORVASC Take 10 mg by mouth daily.   aspirin EC 81 MG tablet Take 81 mg by mouth daily. Swallow whole.   Chlorpheniramine-DM 4-30 MG Tabs Take by mouth.   ciprofloxacin 750 MG tablet Commonly known as: CIPRO Take 1 tablet (750 mg total) by mouth 2 (two) times daily for 7 days.   furosemide 20 MG tablet Commonly known as: LASIX Take 20 mg by mouth daily as needed for edema.   gabapentin 100 MG capsule Commonly known as: NEURONTIN Take 100 mg by mouth 2 (two) times daily.   glipiZIDE 5 MG tablet Commonly known as: GLUCOTROL Take 5 mg by mouth daily before breakfast.   loperamide 2 MG capsule Commonly known as: IMODIUM Take by mouth as needed for diarrhea or loose  stools.   Melatonin 5 MG Caps Take 1 tablet by mouth at bedtime as needed for sleep. What changed: Another medication with the same name was removed. Continue taking this medication, and follow the directions you see here.   metFORMIN 500 MG tablet Commonly known as: GLUCOPHAGE Take 1 tablet by mouth 3 (three) times daily.   metoprolol tartrate 100 MG tablet Commonly known as: LOPRESSOR Take 100 mg by mouth daily.   ondansetron 4 MG disintegrating tablet Commonly known as: ZOFRAN-ODT Take 4 mg by mouth every 8 (eight) hours as needed for nausea.   OVER THE COUNTER MEDICATION doTerra for healthy immnune system   oxyCODONE 5 MG immediate release tablet Commonly known as: Oxy IR/ROXICODONE Take 1 tablet (5 mg total) by mouth every 4 (four) hours as needed for moderate pain (pain score 4-6).   OZEMPIC (0.25 OR 0.5 MG/DOSE) [place] Inject 0.5 mg into the skin every 7 (seven) days.   pantoprazole 40 MG tablet Commonly known as: PROTONIX Take 1 tablet (40 mg total) by mouth daily.   rosuvastatin 10 MG tablet Commonly known as: CRESTOR Take 10 mg by mouth  daily.   TYLENOL 500 MG tablet Generic drug: acetaminophen Take 500 mg by mouth every 6 (six) hours as needed.   valACYclovir 1000 MG tablet Commonly known as: VALTREX Take 1,000 mg by mouth 3 (three) times daily.   vitamin B-12 500 MCG tablet Commonly known as: CYANOCOBALAMIN Take 500 mcg by mouth daily.   Vitamin D3 50 MCG (2000 UT) Tabs Take 2,000 Units by mouth daily.        Follow-up Information     Danella Penton, MD. Schedule an appointment as soon as possible for a visit in 1 week(s).   Specialty: Internal Medicine Why: Hospital follow-up Contact information: [address] [place] [address] [phone]                Discharge Exam: Ceasar Mons Weights   09/10/22 0618  Weight: 90 kg   GENERAL:  77 y.o.-year-old patient with no acute distress.  LUNGS: Normal breath sounds bilaterally, no  wheezing CARDIOVASCULAR: S1, S2 normal. No murmur   ABDOMEN: Soft, nontender, nondistended. Bowel sounds present. Shingles over the left side of the abdomen EXTREMITIES: No  edema b/l.    NEUROLOGIC: nonfocal  patient is alert and awake SKIN: as above shingles rash  Condition at discharge: fair  The results of significant diagnostics from this hospitalization (including imaging, microbiology, ancillary and laboratory) are listed below for reference.   Imaging Studies: US ABDOMEN LIMITED RUQ (LIVER/GB)  Result Date: 09/10/2022 CLINICAL DATA:  Right upper quadrant abdominal pain EXAM: ULTRASOUND ABDOMEN LIMITED RIGHT UPPER QUADRANT COMPARISON:  CT abdomen and pelvis dated 09/10/2022 FINDINGS: Technically challenging examination due to patient body habitus and skin sensitivity resulting in inability to tolerate contact with the ultrasound probe. Gallbladder: Not well seen. Common bile duct: Not well seen. Liver: Suboptimally evaluated. No definite focal lesion identified. Within normal limits in parenchymal echogenicity. Portal vein is not seen. Other: None. IMPRESSION: Markedly challenging examination resulting in suboptimal evaluation as described. Electronically Signed   By: Agustin Cree M.D.   On: 09/10/2022 10:57   CT Head Wo Contrast  Result Date: 09/10/2022 CLINICAL DATA:  Head trauma, minor (Age >= 65y) EXAM: CT HEAD WITHOUT CONTRAST TECHNIQUE: Contiguous axial images were obtained from the base of the skull through the vertex without intravenous contrast. RADIATION DOSE REDUCTION: This exam was performed according to the departmental dose-optimization program which includes automated exposure control, adjustment of the mA and/or kV according to patient size and/or use of iterative reconstruction technique. COMPARISON:  CT head July 05, 2015. FINDINGS: Brain: Remote infarct in the left frontal white matter. Remote infarct in the left cerebellum. Additional patchy white matter hypodensities are  nonspecific but compatible with chronic microvascular ischemic disease. No evidence of acute large vascular territory infarct, acute hemorrhage, mass lesion or midline shift. Vascular: No hyperdense vessel identified. Skull: No acute fracture. Sinuses/Orbits: Clear sinuses.  No acute orbital findings. Other: No mastoid effusions. IMPRESSION: 1. No evidence of acute intracranial abnormality. 2. Chronic microvascular ischemic disease and remote infarcts. Electronically Signed   By: Feliberto Harts M.D.   On: 09/10/2022 10:24   CT ABDOMEN PELVIS W CONTRAST  Result Date: 09/10/2022 CLINICAL DATA:  Nausea, vomiting and weakness for 3 days. Decreased appetite. Shingles. Bowel obstruction suspected. EXAM: CT ABDOMEN AND PELVIS WITH CONTRAST TECHNIQUE: Multidetector CT imaging of the abdomen and pelvis was performed using the standard protocol following bolus administration of intravenous contrast. RADIATION DOSE REDUCTION: This exam was performed according to the departmental dose-optimization program which includes automated  exposure control, adjustment of the mA and/or kV according to patient size and/or use of iterative reconstruction technique. CONTRAST:  80mL OMNIPAQUE IOHEXOL 300 MG/ML  SOLN COMPARISON:  Noncontrast abdominopelvic CT 10/25/2021 and 06/26/2020. FINDINGS: Lower chest: Stable linear atelectasis or scarring at both lung bases. There is no pleural or pericardial effusion. Atherosclerosis of the aorta and coronary arteries noted. There are calcifications of the aortic valve. Hepatobiliary: The liver is normal in density without suspicious focal abnormality. No evidence of gallstones, gallbladder wall thickening or biliary dilatation. Pancreas: The pancreas appears stable, without acute findings. There is no pancreatic ductal dilatation. Small cystic lesions involving the pancreatic body and tail are noted, measuring 8 mm on image 37/2 and 7 mm on image 39/2, grossly stable from previous study,  although more obvious on today's examination performed with contrast. Spleen: Normal in size without focal abnormality. Adrenals/Urinary Tract: Both adrenal glands appear normal. Nonobstructing bilateral renal calculi and bilateral renal cortical scarring are again noted. No evidence of ureteral calculus or hydronephrosis. There are no suspicious renal lesions. Probable renal cysts bilaterally for which no follow-up imaging is recommended. The bladder appears unremarkable for its degree of distention. Stomach/Bowel: No enteric contrast administered. There is a small hiatal hernia. There is circumferential wall thickening of the distal stomach and duodenum with mild surrounding soft tissue stranding. The distal small bowel, appendix and proximal colon appear normal. There are diverticular changes throughout the distal colon, without evidence of acute inflammation. Moderate stool throughout the colon. Vascular/Lymphatic: There are no enlarged abdominal or pelvic lymph nodes. Aortic and branch vessel atherosclerosis without evidence of large vessel occlusion or aneurysm. The portal, superior mesenteric and splenic veins are patent. Reproductive: Stable small calcified uterine fibroids. No suspicious adnexal findings. Other: Probable pelvic floor laxity. Intact abdominal wall. Small amount of retroperitoneal fluid adjacent to the inflamed appearing duodenum. No ascites or free air. Musculoskeletal: No acute or significant osseous findings. Subacute appearing fracture of the right 8th rib laterally. Multilevel thoracolumbar spondylosis associated with a convex right lumbar scoliosis. IMPRESSION: 1. Circumferential wall thickening of the distal stomach and duodenum with mild surrounding soft tissue stranding, suspicious for peptic ulcer disease or duodenitis. No evidence of perforation, abscess or bowel obstruction. 2. Distal colonic diverticulosis without evidence of acute inflammation. Moderate stool throughout the  colon suggesting constipation. 3. Nonobstructing bilateral renal calculi. No evidence of ureteral calculus or hydronephrosis. 4. Small cystic lesions involving the pancreatic body and tail, grossly stable from previous study, although more obvious on today's examination performed with contrast. 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 Consider continued follow-up with MRI in 2 years. This recommendation follows ACR consensus guidelines: Management of Incidental Pancreatic Cysts: A White Paper of the ACR Incidental Findings Committee. J Am Coll Radiol 2017;14:911-923. 5.  Aortic Atherosclerosis (ICD10-I70.0). Electronically Signed   By: Carey Bullocks M.D.   On: 09/10/2022 08:22    Microbiology: Results for orders placed or performed during the hospital encounter of 09/10/22  Urine Culture     Status: Abnormal   Collection Time: 09/10/22  9:22 AM   Specimen: Urine, Random  Result Value Ref Range Status   Specimen Description   Final    URINE, RANDOM Performed at [facility], [address], [place], [place] [address]    Special Requests   Final    NONE Reflexed from [id number] Performed at [facility], [address] [place], [place] [address]    Culture 80,000 COLONIES/mL PSEUDOMONAS AERUGINOSA (A)  Final   Report Status 09/12/2022 FINAL  Final   Organism ID, Bacteria PSEUDOMONAS AERUGINOSA (A)  Final      Susceptibility   Pseudomonas aeruginosa - MIC*    CEFTAZIDIME 8 SENSITIVE Sensitive     CIPROFLOXACIN INTERMEDIATE Intermediate     GENTAMICIN <=1 SENSITIVE Sensitive     IMIPENEM 1 SENSITIVE Sensitive     * 80,000 COLONIES/mL PSEUDOMONAS AERUGINOSA  Culture, blood (Routine X 2) w Reflex to ID Panel     Status: None (Preliminary result)   Collection Time: 09/10/22  1:58 PM   Specimen: BLOOD LEFT HAND  Result Value Ref Range Status    Specimen Description BLOOD LEFT HAND  Final   Special Requests   Final    BOTTLES DRAWN AEROBIC AND ANAEROBIC Blood Culture adequate volume   Culture   Final    NO GROWTH 2 DAYS Performed at [facility], [address] [place], [place] [address]    Report Status PENDING  Incomplete  Culture, blood (Routine X 2) w Reflex to ID Panel     Status: None (Preliminary result)   Collection Time: 09/10/22  2:05 PM   Specimen: BLOOD  Result Value Ref Range Status   Specimen Description BLOOD RA  Final   Special Requests   Final    BOTTLES DRAWN AEROBIC AND ANAEROBIC Blood Culture adequate volume   Culture   Final    NO GROWTH 2 DAYS Performed at [facility], [address] [place], [place] [address]    Report Status PENDING  Incomplete    Labs: CBC: Recent Labs  Lab 09/10/22 0623 09/11/22 0619  WBC 11.8* 8.1  NEUTROABS 8.9*  --   HGB 18.1* 14.8  HCT 53.1* 44.1  MCV 87.5 89.3  PLT 265 206   Basic Metabolic Panel: Recent Labs  Lab 09/10/22 0623 09/11/22 0619  NA 133* 132*  K 4.8 4.5  CL 96* 104  CO2 25 22  GLUCOSE 228* 122*  BUN 31* 27*  CREATININE 1.53* 1.56*  CALCIUM 9.2 8.1*   Liver Function Tests: Recent Labs  Lab 09/10/22 0623 09/11/22 0619  AST 65* 32  ALT 100* 52*  ALKPHOS 69 43  BILITOT 1.3* 1.5*  PROT 7.2 5.1*  ALBUMIN 3.3* 2.4*   CBG: Recent Labs  Lab 09/11/22 1147 09/11/22 1808 09/11/22 2133 09/12/22 0812 09/12/22 1130  GLUCAP 172* 145* 141* 129* 194*    Discharge time spent: greater than 30 minutes.  Signed: Enedina Finner, MD [facility] 09/12/2022

## 2022-09-12 NOTE — Evaluation (Signed)
Physical Therapy Evaluation Patient Details Name: [name] MRN: [id number] DOB: 1945/07/04 Today's Date: 09/12/2022  History of Present Illness  [name] is a 77 y/o F with PMH including DM, HTN, CVA, gout, anxiety, CKD, TKA (2023 and 2022), R shoulder surgery. Pt admitted with AMS, N/V, s/p 2 falls this week; is taking medication for shingles.   Clinical Impression  Patient received reclining in bed and agreeable to PT eval. Son Sharl Ma and DIL Clydie Braun present. Patient is alert and oriented but history mostly taken from OT documentation yesterday. Patient lives with her husband who is not able to provide any physical assist. Prior to hospitalization patient was mod I with ADLs and mobility with rollator, but usually has one fall a month. She sleeps in a lift chair. Upon PT evaluation, patient was able to get out of bed, transfer bed to chair and chair to chair with supervision using a RW, and ambulate approx 70 feet using a RW with supervision. She did forget to reach back/push off the surface when transferring and demonstrated a slight posterior bias she was able to overcome. Recommend patient have some light physical assistance available when showering and with the steps to enter the home. She reported B LE fatigue as the limiting factor for ambulation. She ambulated more slowly than usual and fatigued more quickly. Patient would benefit from skilled physical therapy to address impairments and functional limitations (see PT Problem List below) to work towards stated goals and return to PLOF or maximal functional independence.      Recommendations for follow up therapy are one component of a multi-disciplinary discharge planning process, led by the attending physician.  Recommendations may be updated based on patient status, additional functional criteria and insurance authorization.  Follow Up Recommendations       Assistance Recommended at Discharge Frequent or constant  Supervision/Assistance  Patient can return home with the following  A little help with walking and/or transfers;Assistance with cooking/housework;A little help with bathing/dressing/bathroom;Help with stairs or ramp for entrance;Assist for transportation    Equipment Recommendations None recommended by PT  Recommendations for Other Services       Functional Status Assessment Patient has had a recent decline in their functional status and demonstrates the ability to make significant improvements in function in a reasonable and predictable amount of time.     Precautions / Restrictions Precautions Precautions: Fall;Other (comment) Precaution Comments: airborne and contact precautions for shingles Restrictions Weight Bearing Restrictions: No      Mobility  Bed Mobility Overal bed mobility: Needs Assistance Bed Mobility: Sit to Supine       Sit to supine: Supervision   General bed mobility comments: increased time and uses bedrails. uses lift recliner for sleeping.    Transfers Overall transfer level: Needs assistance   Transfers: Sit to/from Stand Sit to Stand: Supervision           General transfer comment: sit <> stand from edge of bed to chair and in and out of chair x2. Patient forgets to push up/reach back to/from chair/bed but remembers she is supposed to with cuing. A little posterior bias noted but able to overcome it.    Ambulation/Gait Ambulation/Gait assistance: Supervision Gait Distance (Feet): 70 Feet Assistive device: Rolling walker (2 wheels)   Gait velocity: very slow     General Gait Details: patient ambulates very slowly with short step length and low foot clearance. States she can usually walk a little better at home.  She reports fatigue in  B LE as limitin factor. Vitals [facility]  Stairs            Wheelchair Mobility    Modified Rankin (Stroke Patients Only)       Balance Overall balance assessment: Needs assistance Sitting-balance  support: Feet supported Sitting balance-Leahy Scale: Normal     Standing balance support: Bilateral upper extremity supported, Reliant on assistive device for balance, During functional activity Standing balance-Leahy Scale: Fair Standing balance comment: depenent on RW for support. mild posterior bias that patient is able to overcome.                             Pertinent Vitals/Pain Pain Assessment Pain Assessment: Faces Faces Pain Scale: Hurts little more Pain Location: left thigh when touched while donning brief, low back when gait belt tightened Pain Descriptors / Indicators: Guarding, Grimacing, Moaning, Crying Pain Intervention(s): Limited activity within patient's tolerance, Repositioned, Monitored during session    Home Living Family/patient expects to be discharged to:: Private residence Living Arrangements: Spouse/significant other Available Help at Discharge: Family;Available PRN/intermittently Type of Home: House Home Access: Stairs to enter Entrance Stairs-Rails: Right Entrance Stairs-Number of Steps: 3-4   Home Layout: One level Home Equipment: Rollator (4 wheels);Shower seat;BSC/3in1;Grab bars - toilet;Grab bars - tub/shower;Other (comment) (3 wheeled walker, lift recliner) Additional Comments: Pt sleeps in lift chair    Prior Function Prior Level of Function : Independent/Modified Independent;History of Falls (last six months)             Mobility Comments: MOD (I) with rollator in the home, 3 wheeled walker in the community; endorses an average of 1 fall per month (corroborated with daughter); no serious injuries since fall with shoulder surgery a year or two ago. ADLs Comments: Pt's husband does the driving. They order groceries or get takeout. Share laundry and cooking responsibilities. Pt is (I) with DME for ADLs. Cannot fix walker into bathroom, so holds on to sink. Only one fall in bathroom. Pt endorses that falls are typically when she is  doing something risky such as leaning forward with a reacher or adjusting robot vacuum on floor.     Hand Dominance   Dominant Hand: Right    Extremity/Trunk Assessment                Communication   Communication: No difficulties  Cognition Arousal/Alertness: Awake/alert Behavior During Therapy: WFL for tasks assessed/performed Overall Cognitive Status: Within Functional Limits for tasks assessed                                 General Comments: oriented, pleasant        General Comments General comments (skin integrity, edema, etc.): patient on room air    Exercises Other Exercises Other Exercises: educated patient/family on role of PT in actue care setting and discharge reccomendations.   Assessment/Plan    PT Assessment Patient needs continued PT services  PT Problem List Decreased strength;Decreased balance;Pain;Obesity;Decreased knowledge of use of DME;Decreased mobility;Decreased activity tolerance;Decreased skin integrity       PT Treatment Interventions DME instruction;Functional mobility training;Balance training;Patient/family education;Gait training;Therapeutic activities;Neuromuscular re-education;Stair training;Therapeutic exercise    PT Goals (Current goals can be found in the Care Plan section)  Acute Rehab PT Goals Patient Stated Goal: to go home PT Goal Formulation: With patient Time For Goal Achievement: 09/26/22 Potential to Achieve Goals: Good    Frequency  Min 3X/week     Co-evaluation               AM-PAC PT "6 Clicks" Mobility  Outcome Measure Help needed turning from your back to your side while in a flat bed without using bedrails?: A Little Help needed moving from lying on your back to sitting on the side of a flat bed without using bedrails?: A Little Help needed moving to and from a bed to a chair (including a wheelchair)?: A Little Help needed standing up from a chair using your arms (e.g., wheelchair or  bedside chair)?: A Little Help needed to walk in hospital room?: A Little Help needed climbing 3-5 steps with a railing? : A Little 6 Click Score: 18    End of Session Equipment Utilized During Treatment: Gait belt Activity Tolerance: Patient tolerated treatment well;Patient limited by fatigue Patient left: in chair;with chair alarm set;with nursing/sitter in room;with call bell/phone within reach;with family/visitor present Nurse Communication: Mobility status PT Visit Diagnosis: Unsteadiness on feet (R26.81);History of falling (Z91.81);Repeated falls (R29.6);Difficulty in walking, not elsewhere classified (R26.2);Muscle weakness (generalized) (M62.81);Pain Pain - Right/Left: Left Pain - part of body: Leg;Arm    Time: 4098-1191 PT Time Calculation (min) (ACUTE ONLY): 32 min   Charges:   PT Evaluation $PT Eval Low Complexity: 1 Low PT Treatments $Gait Training: 8-22 mins        Luretha Murphy. Ilsa Iha, PT, DPT 09/12/22, 11:56 AM

## 2022-09-12 NOTE — Plan of Care (Signed)

## 2022-09-12 NOTE — TOC Transition Note (Signed)
Transition of Care [facility]) - CM/SW Discharge Note   Patient Details  Name: [name] MRN: [id number] Date of Birth: Aug 19, 1945  Transition of Care [facility]) CM/SW Contact:  Liliana Cline, LCSW Phone Number: 09/12/2022, 1:25 PM   Clinical Narrative:    Patient to DC home today. Katina with [facility] is able to accept patient back for HHPT.   Final next level of care: Home w Home Health Services Barriers to Discharge: Barriers Resolved   Patient Goals and CMS Choice      Discharge Placement                         Discharge Plan and Services Additional resources added to the After Visit Summary for                            [facility] Arranged: PT HH Agency: [facility] Date [facility] Agency Contacted: 09/12/22   Representative spoke with at [facility] Agency: Laurelyn Sickle  Social Determinants of Health (SDOH) Interventions SDOH Screenings   Food Insecurity: No Food Insecurity (09/11/2022)  Housing: Low Risk  (09/11/2022)  Transportation Needs: No Transportation Needs (09/11/2022)  Utilities: Not At Risk (09/11/2022)  Tobacco Use: Low Risk  (09/11/2022)     Readmission Risk Interventions     No data to display

## 2022-09-13 LAB — CULTURE, BLOOD (ROUTINE X 2): Special Requests: ADEQUATE

## 2022-09-13 LAB — URINE CULTURE: Culture: 80000 — AB

## 2022-09-14 DIAGNOSIS — I69351 Hemiplegia and hemiparesis following cerebral infarction affecting right dominant side: Secondary | ICD-10-CM | POA: Diagnosis not present

## 2022-09-14 DIAGNOSIS — N390 Urinary tract infection, site not specified: Secondary | ICD-10-CM | POA: Diagnosis not present

## 2022-09-14 DIAGNOSIS — I129 Hypertensive chronic kidney disease with stage 1 through stage 4 chronic kidney disease, or unspecified chronic kidney disease: Secondary | ICD-10-CM | POA: Diagnosis not present

## 2022-09-14 DIAGNOSIS — M1990 Unspecified osteoarthritis, unspecified site: Secondary | ICD-10-CM | POA: Diagnosis not present

## 2022-09-14 DIAGNOSIS — N184 Chronic kidney disease, stage 4 (severe): Secondary | ICD-10-CM | POA: Diagnosis not present

## 2022-09-14 DIAGNOSIS — M8580 Other specified disorders of bone density and structure, unspecified site: Secondary | ICD-10-CM | POA: Diagnosis not present

## 2022-09-14 DIAGNOSIS — K279 Peptic ulcer, site unspecified, unspecified as acute or chronic, without hemorrhage or perforation: Secondary | ICD-10-CM | POA: Diagnosis not present

## 2022-09-14 DIAGNOSIS — I482 Chronic atrial fibrillation, unspecified: Secondary | ICD-10-CM | POA: Diagnosis not present

## 2022-09-14 DIAGNOSIS — E1122 Type 2 diabetes mellitus with diabetic chronic kidney disease: Secondary | ICD-10-CM | POA: Diagnosis not present

## 2022-09-14 LAB — CULTURE, BLOOD (ROUTINE X 2): Culture: NO GROWTH

## 2022-09-15 DIAGNOSIS — N390 Urinary tract infection, site not specified: Secondary | ICD-10-CM | POA: Diagnosis not present

## 2022-09-15 DIAGNOSIS — K279 Peptic ulcer, site unspecified, unspecified as acute or chronic, without hemorrhage or perforation: Secondary | ICD-10-CM | POA: Diagnosis not present

## 2022-09-15 DIAGNOSIS — F19959 Other psychoactive substance use, unspecified with psychoactive substance-induced psychotic disorder, unspecified: Secondary | ICD-10-CM | POA: Diagnosis not present

## 2022-09-15 DIAGNOSIS — Z6832 Body mass index (BMI) 32.0-32.9, adult: Secondary | ICD-10-CM | POA: Diagnosis not present

## 2022-09-15 DIAGNOSIS — E669 Obesity, unspecified: Secondary | ICD-10-CM | POA: Diagnosis not present

## 2022-09-15 DIAGNOSIS — E1122 Type 2 diabetes mellitus with diabetic chronic kidney disease: Secondary | ICD-10-CM | POA: Diagnosis not present

## 2022-09-15 DIAGNOSIS — E869 Volume depletion, unspecified: Secondary | ICD-10-CM | POA: Diagnosis not present

## 2022-09-15 LAB — CULTURE, BLOOD (ROUTINE X 2)
Culture: NO GROWTH
Special Requests: ADEQUATE

## 2022-09-22 DIAGNOSIS — N184 Chronic kidney disease, stage 4 (severe): Secondary | ICD-10-CM | POA: Diagnosis not present

## 2022-09-22 DIAGNOSIS — M8580 Other specified disorders of bone density and structure, unspecified site: Secondary | ICD-10-CM | POA: Diagnosis not present

## 2022-09-22 DIAGNOSIS — K279 Peptic ulcer, site unspecified, unspecified as acute or chronic, without hemorrhage or perforation: Secondary | ICD-10-CM | POA: Diagnosis not present

## 2022-09-22 DIAGNOSIS — E1122 Type 2 diabetes mellitus with diabetic chronic kidney disease: Secondary | ICD-10-CM | POA: Diagnosis not present

## 2022-09-22 DIAGNOSIS — I69351 Hemiplegia and hemiparesis following cerebral infarction affecting right dominant side: Secondary | ICD-10-CM | POA: Diagnosis not present

## 2022-09-22 DIAGNOSIS — I129 Hypertensive chronic kidney disease with stage 1 through stage 4 chronic kidney disease, or unspecified chronic kidney disease: Secondary | ICD-10-CM | POA: Diagnosis not present

## 2022-09-22 DIAGNOSIS — M1990 Unspecified osteoarthritis, unspecified site: Secondary | ICD-10-CM | POA: Diagnosis not present

## 2022-09-22 DIAGNOSIS — I482 Chronic atrial fibrillation, unspecified: Secondary | ICD-10-CM | POA: Diagnosis not present

## 2022-09-22 DIAGNOSIS — N390 Urinary tract infection, site not specified: Secondary | ICD-10-CM | POA: Diagnosis not present

## 2022-09-29 DIAGNOSIS — B0229 Other postherpetic nervous system involvement: Secondary | ICD-10-CM | POA: Diagnosis not present

## 2022-09-29 DIAGNOSIS — I69351 Hemiplegia and hemiparesis following cerebral infarction affecting right dominant side: Secondary | ICD-10-CM | POA: Diagnosis not present

## 2022-09-29 DIAGNOSIS — I482 Chronic atrial fibrillation, unspecified: Secondary | ICD-10-CM | POA: Diagnosis not present

## 2022-09-29 DIAGNOSIS — N184 Chronic kidney disease, stage 4 (severe): Secondary | ICD-10-CM | POA: Diagnosis not present

## 2022-09-29 DIAGNOSIS — K279 Peptic ulcer, site unspecified, unspecified as acute or chronic, without hemorrhage or perforation: Secondary | ICD-10-CM | POA: Diagnosis not present

## 2022-09-29 DIAGNOSIS — N390 Urinary tract infection, site not specified: Secondary | ICD-10-CM | POA: Diagnosis not present

## 2022-09-29 DIAGNOSIS — K2970 Gastritis, unspecified, without bleeding: Secondary | ICD-10-CM | POA: Diagnosis not present

## 2022-09-29 DIAGNOSIS — M1990 Unspecified osteoarthritis, unspecified site: Secondary | ICD-10-CM | POA: Diagnosis not present

## 2022-09-29 DIAGNOSIS — I129 Hypertensive chronic kidney disease with stage 1 through stage 4 chronic kidney disease, or unspecified chronic kidney disease: Secondary | ICD-10-CM | POA: Diagnosis not present

## 2022-09-29 DIAGNOSIS — E1122 Type 2 diabetes mellitus with diabetic chronic kidney disease: Secondary | ICD-10-CM | POA: Diagnosis not present

## 2022-09-29 DIAGNOSIS — N3090 Cystitis, unspecified without hematuria: Secondary | ICD-10-CM | POA: Diagnosis not present

## 2022-09-29 DIAGNOSIS — M8580 Other specified disorders of bone density and structure, unspecified site: Secondary | ICD-10-CM | POA: Diagnosis not present

## 2022-10-06 DIAGNOSIS — B0229 Other postherpetic nervous system involvement: Secondary | ICD-10-CM | POA: Diagnosis not present

## 2022-10-06 DIAGNOSIS — E119 Type 2 diabetes mellitus without complications: Secondary | ICD-10-CM | POA: Diagnosis not present

## 2022-10-06 DIAGNOSIS — E669 Obesity, unspecified: Secondary | ICD-10-CM | POA: Diagnosis not present

## 2022-10-06 DIAGNOSIS — Z6832 Body mass index (BMI) 32.0-32.9, adult: Secondary | ICD-10-CM | POA: Diagnosis not present

## 2022-10-27 DIAGNOSIS — E669 Obesity, unspecified: Secondary | ICD-10-CM | POA: Diagnosis not present

## 2022-10-27 DIAGNOSIS — K2970 Gastritis, unspecified, without bleeding: Secondary | ICD-10-CM | POA: Diagnosis not present

## 2022-10-27 DIAGNOSIS — Z6832 Body mass index (BMI) 32.0-32.9, adult: Secondary | ICD-10-CM | POA: Diagnosis not present

## 2022-10-27 DIAGNOSIS — B0229 Other postherpetic nervous system involvement: Secondary | ICD-10-CM | POA: Diagnosis not present

## 2022-11-06 ENCOUNTER — Emergency Department: Payer: Medicare HMO

## 2022-11-06 ENCOUNTER — Emergency Department
Admission: EM | Admit: 2022-11-06 | Discharge: 2022-11-06 | Disposition: A | Discharge status: Home / Self Care | Payer: Medicare HMO | Attending: Emergency Medicine | Admitting: Emergency Medicine

## 2022-11-06 ENCOUNTER — Other Ambulatory Visit: Payer: Self-pay

## 2022-11-06 DIAGNOSIS — E1122 Type 2 diabetes mellitus with diabetic chronic kidney disease: Secondary | ICD-10-CM | POA: Diagnosis not present

## 2022-11-06 DIAGNOSIS — N189 Chronic kidney disease, unspecified: Secondary | ICD-10-CM | POA: Insufficient documentation

## 2022-11-06 DIAGNOSIS — I129 Hypertensive chronic kidney disease with stage 1 through stage 4 chronic kidney disease, or unspecified chronic kidney disease: Secondary | ICD-10-CM | POA: Insufficient documentation

## 2022-11-06 DIAGNOSIS — N200 Calculus of kidney: Secondary | ICD-10-CM | POA: Diagnosis not present

## 2022-11-06 DIAGNOSIS — B0223 Postherpetic polyneuropathy: Secondary | ICD-10-CM | POA: Insufficient documentation

## 2022-11-06 DIAGNOSIS — R0902 Hypoxemia: Secondary | ICD-10-CM | POA: Diagnosis not present

## 2022-11-06 DIAGNOSIS — R109 Unspecified abdominal pain: Secondary | ICD-10-CM | POA: Insufficient documentation

## 2022-11-06 DIAGNOSIS — I10 Essential (primary) hypertension: Secondary | ICD-10-CM | POA: Diagnosis not present

## 2022-11-06 DIAGNOSIS — R1030 Lower abdominal pain, unspecified: Secondary | ICD-10-CM | POA: Diagnosis not present

## 2022-11-06 DIAGNOSIS — B0229 Other postherpetic nervous system involvement: Secondary | ICD-10-CM

## 2022-11-06 DIAGNOSIS — K5730 Diverticulosis of large intestine without perforation or abscess without bleeding: Secondary | ICD-10-CM | POA: Diagnosis not present

## 2022-11-06 LAB — CBC
HCT: 44.8 % (ref 36.0–46.0)
Hemoglobin: 14.8 g/dL (ref 12.0–15.0)
MCH: 30.2 pg (ref 26.0–34.0)
MCHC: 33 g/dL (ref 30.0–36.0)
MCV: 91.4 fL (ref 80.0–100.0)
Platelets: 236 10*3/uL (ref 150–400)
RBC: 4.9 MIL/uL (ref 3.87–5.11)
RDW: 15.3 % (ref 11.5–15.5)
WBC: 7.1 10*3/uL (ref 4.0–10.5)
nRBC: 0 % (ref 0.0–0.2)

## 2022-11-06 LAB — URINALYSIS, ROUTINE W REFLEX MICROSCOPIC
Bilirubin Urine: NEGATIVE
Glucose, UA: NEGATIVE mg/dL
Hgb urine dipstick: NEGATIVE
Ketones, ur: NEGATIVE mg/dL
Nitrite: NEGATIVE
Protein, ur: 100 mg/dL — AB
Specific Gravity, Urine: 1.008 (ref 1.005–1.030)
pH: 6 (ref 5.0–8.0)

## 2022-11-06 LAB — COMPREHENSIVE METABOLIC PANEL
ALT: 25 U/L (ref 0–44)
AST: 34 U/L (ref 15–41)
Albumin: 3.1 g/dL — ABNORMAL LOW (ref 3.5–5.0)
Alkaline Phosphatase: 50 U/L (ref 38–126)
Anion gap: 9 (ref 5–15)
BUN: 16 mg/dL (ref 8–23)
CO2: 22 mmol/L (ref 22–32)
Calcium: 9.3 mg/dL (ref 8.9–10.3)
Chloride: 105 mmol/L (ref 98–111)
Creatinine, Ser: 1.1 mg/dL — ABNORMAL HIGH (ref 0.44–1.00)
GFR, Estimated: 51 mL/min — ABNORMAL LOW (ref >60)
Glucose, Bld: 123 mg/dL — ABNORMAL HIGH (ref 70–99)
Potassium: 3.8 mmol/L (ref 3.5–5.1)
Sodium: 136 mmol/L (ref 135–145)
Total Bilirubin: 1.7 mg/dL — ABNORMAL HIGH (ref 0.3–1.2)
Total Protein: 6.4 g/dL — ABNORMAL LOW (ref 6.5–8.1)

## 2022-11-06 LAB — LIPASE, BLOOD: Lipase: 34 U/L (ref 11–51)

## 2022-11-06 MED ORDER — CEFDINIR 300 MG PO CAPS: 300.0000 mg | ORAL_CAPSULE | Freq: Two times a day (BID) | ORAL | 0 refills | Status: AC

## 2022-11-06 MED ORDER — CEFDINIR 300 MG PO CAPS
300.0000 mg | ORAL_CAPSULE | Freq: Once | ORAL | Status: AC
  Administered 2022-11-06: 300 mg via ORAL
  Filled 2022-11-06: qty 1

## 2022-11-06 MED ORDER — TRAMADOL HCL 50 MG PO TABS
50.0000 mg | ORAL_TABLET | Freq: Once | ORAL | Status: AC
  Administered 2022-11-06: 50 mg via ORAL
  Filled 2022-11-06: qty 1

## 2022-11-06 NOTE — ED Provider Notes (Signed)
[facility] Provider Note    Event Date/Time   First MD Initiated Contact with Patient 11/06/22 [phone]     (approximate)   History   Flank Pain   HPI  [name] is a 77 y.o. female with history of CKD, diabetes, hypertension, kidney stones who presents with left flank pain.  Patient reports she has been dealing with pain from shingles over the last 1 to 2 months in the left flank.  However last night pain seem to worsen she thought that it could be a UTI or kidney stone.  Denies fevers or chills.  No nausea or vomiting     Physical Exam   Triage Vital Signs: ED Triage Vitals  Enc Vitals Group     BP 11/06/22 0803 (!) 184/94     Pulse Rate 11/06/22 0802 70     Resp 11/06/22 0802 16     Temp 11/06/22 0802 98.5 F (36.9 C)     Temp Source 11/06/22 0802 Oral     SpO2 11/06/22 0802 96 %     Weight 11/06/22 0759 84.8 kg (187 lb)     Height 11/06/22 0759 1.626 m (5\' 4" )     Head Circumference --      Peak Flow --      Pain Score 11/06/22 0759 8     Pain Loc --      Pain Edu? --      Excl. in GC? --     Most recent vital signs: Vitals:   11/06/22 0802 11/06/22 0803  BP:  (!) 184/94  Pulse: 70   Resp: 16   Temp: 98.5 F (36.9 C)   SpO2: 96%      General: Awake, no distress.  CV:  Good peripheral perfusion.  Resp:  Normal effort.  Abd:  No distention.  Soft, nontender Other:  Healed shingles rash with some scarring noted left flank   ED Results / Procedures / Treatments   Labs (all labs ordered are listed, but only abnormal results are displayed) Labs Reviewed  URINALYSIS, ROUTINE W REFLEX MICROSCOPIC - Abnormal; Notable for the following components:      Result Value   Color, Urine STRAW (*)    APPearance CLEAR (*)    Protein, ur 100 (*)    Leukocytes,Ua SMALL (*)    Bacteria, UA MANY (*)    All other components within normal limits  COMPREHENSIVE METABOLIC PANEL - Abnormal; Notable for the following components:    Glucose, Bld 123 (*)    Creatinine, Ser 1.10 (*)    Total Protein 6.4 (*)    Albumin 3.1 (*)    Total Bilirubin 1.7 (*)    GFR, Estimated 51 (*)    All other components within normal limits  CBC  LIPASE, BLOOD     EKG     RADIOLOGY     PROCEDURES:  Critical Care performed:   Procedures   MEDICATIONS ORDERED IN ED: Medications  traMADol (ULTRAM) tablet 50 mg (50 mg Oral Given 11/06/22 0844)  cefdinir (OMNICEF) capsule 300 mg (300 mg Oral Given 11/06/22 1023)     IMPRESSION / MDM / ASSESSMENT AND PLAN / ED COURSE  I reviewed the triage vital signs and the nursing notes. Patient's presentation is most consistent with acute presentation with potential threat to life or bodily function.  Patient presents with left leg pain as detailed.  This could be residual pain from shingles, UTI, ureterolithiasis, infection, diverticulitis  Overall  well-appearing and in no acute distress.  Will check labs including urinalysis, obtain CT renal stone study given her long history of kidney stones treat with p.o. medications and reevaluate.  CT renal stone study, lab work overall reassuring, questionable UTI on UA, will cover given dysuria as symptoms.  Suspect her pain is related to postherpetic neuralgia, discussed this with she and her daughter at length, gabapentin has not worked for her and has made her too fatigued.  Vicodin has worked, do recommend continuing this      FINAL CLINICAL IMPRESSION(S) / ED DIAGNOSES   Final diagnoses:  Flank pain  Post herpetic neuralgia     Rx / DC Orders   ED Discharge Orders          Ordered    cefdinir (OMNICEF) 300 MG capsule  2 times daily        11/06/22 1020             Note:  This document was prepared using Dragon voice recognition software and may include unintentional dictation errors.   Jene Every, MD 11/06/22 1034

## 2022-11-06 NOTE — ED Triage Notes (Signed)
Pt to ED via EMS with complaints of L flank pain. Pt states they she has had this pain before with a UTI. Pt states that the pain started about 9 weeks ago when diagnosed with shingles but has gotten worse over last 2 weeks. Pt states they have new onset urinary incontinence. Pt A&Ox4.   EMS vitals  97% spo2 RA Pulse 56 BP 168/71

## 2022-11-06 NOTE — ED Notes (Signed)
RN in to assist pt to toilet. Pt stated they had already voided. RN provided peri care and clean linen. Call bell within reach.

## 2022-11-10 DIAGNOSIS — B0229 Other postherpetic nervous system involvement: Secondary | ICD-10-CM | POA: Diagnosis not present

## 2023-02-17 DIAGNOSIS — E1151 Type 2 diabetes mellitus with diabetic peripheral angiopathy without gangrene: Secondary | ICD-10-CM | POA: Diagnosis not present

## 2023-02-17 DIAGNOSIS — N1831 Chronic kidney disease, stage 3a: Secondary | ICD-10-CM | POA: Diagnosis not present

## 2023-02-22 DIAGNOSIS — E1122 Type 2 diabetes mellitus with diabetic chronic kidney disease: Secondary | ICD-10-CM | POA: Diagnosis not present

## 2023-02-22 DIAGNOSIS — N183 Chronic kidney disease, stage 3 unspecified: Secondary | ICD-10-CM | POA: Diagnosis not present

## 2023-02-22 DIAGNOSIS — Z23 Encounter for immunization: Secondary | ICD-10-CM | POA: Diagnosis not present

## 2023-02-24 DIAGNOSIS — Z96651 Presence of right artificial knee joint: Secondary | ICD-10-CM | POA: Diagnosis not present

## 2023-06-02 ENCOUNTER — Other Ambulatory Visit: Payer: Self-pay | Admitting: Internal Medicine

## 2023-06-02 DIAGNOSIS — Z1231 Encounter for screening mammogram for malignant neoplasm of breast: Secondary | ICD-10-CM

## 2023-08-21 ENCOUNTER — Emergency Department
Admission: EM | Admit: 2023-08-21 | Discharge: 2023-08-21 | Disposition: A | Discharge status: Home / Self Care | Attending: Emergency Medicine | Admitting: Emergency Medicine

## 2023-08-21 ENCOUNTER — Emergency Department

## 2023-08-21 ENCOUNTER — Other Ambulatory Visit: Payer: Self-pay

## 2023-08-21 DIAGNOSIS — R32 Unspecified urinary incontinence: Secondary | ICD-10-CM | POA: Diagnosis not present

## 2023-08-21 DIAGNOSIS — R110 Nausea: Secondary | ICD-10-CM | POA: Diagnosis not present

## 2023-08-21 DIAGNOSIS — N189 Chronic kidney disease, unspecified: Secondary | ICD-10-CM | POA: Insufficient documentation

## 2023-08-21 DIAGNOSIS — Z96611 Presence of right artificial shoulder joint: Secondary | ICD-10-CM | POA: Diagnosis not present

## 2023-08-21 DIAGNOSIS — N390 Urinary tract infection, site not specified: Secondary | ICD-10-CM | POA: Diagnosis not present

## 2023-08-21 DIAGNOSIS — R350 Frequency of micturition: Secondary | ICD-10-CM | POA: Diagnosis present

## 2023-08-21 DIAGNOSIS — E1122 Type 2 diabetes mellitus with diabetic chronic kidney disease: Secondary | ICD-10-CM | POA: Diagnosis not present

## 2023-08-21 DIAGNOSIS — I10 Essential (primary) hypertension: Secondary | ICD-10-CM | POA: Diagnosis not present

## 2023-08-21 DIAGNOSIS — R600 Localized edema: Secondary | ICD-10-CM | POA: Diagnosis not present

## 2023-08-21 DIAGNOSIS — Z8673 Personal history of transient ischemic attack (TIA), and cerebral infarction without residual deficits: Secondary | ICD-10-CM | POA: Insufficient documentation

## 2023-08-21 DIAGNOSIS — I129 Hypertensive chronic kidney disease with stage 1 through stage 4 chronic kidney disease, or unspecified chronic kidney disease: Secondary | ICD-10-CM | POA: Insufficient documentation

## 2023-08-21 DIAGNOSIS — R000 Tachycardia, unspecified: Secondary | ICD-10-CM | POA: Diagnosis not present

## 2023-08-21 LAB — CBC
HCT: 47.5 % — ABNORMAL HIGH (ref 36.0–46.0)
Hemoglobin: 15.8 g/dL — ABNORMAL HIGH (ref 12.0–15.0)
MCH: 30 pg (ref 26.0–34.0)
MCHC: 33.3 g/dL (ref 30.0–36.0)
MCV: 90.3 fL (ref 80.0–100.0)
Platelets: 243 10*3/uL (ref 150–400)
RBC: 5.26 MIL/uL — ABNORMAL HIGH (ref 3.87–5.11)
RDW: 12.8 % (ref 11.5–15.5)
WBC: 7.6 10*3/uL (ref 4.0–10.5)
nRBC: 0 % (ref 0.0–0.2)

## 2023-08-21 LAB — COMPREHENSIVE METABOLIC PANEL WITH GFR
ALT: 24 U/L (ref 0–44)
AST: 27 U/L (ref 15–41)
Albumin: 3.3 g/dL — ABNORMAL LOW (ref 3.5–5.0)
Alkaline Phosphatase: 49 U/L (ref 38–126)
Anion gap: 10 (ref 5–15)
BUN: 17 mg/dL (ref 8–23)
CO2: 25 mmol/L (ref 22–32)
Calcium: 9.5 mg/dL (ref 8.9–10.3)
Chloride: 102 mmol/L (ref 98–111)
Creatinine, Ser: 1.19 mg/dL — ABNORMAL HIGH (ref 0.44–1.00)
GFR, Estimated: 47 mL/min — ABNORMAL LOW (ref >60)
Glucose, Bld: 160 mg/dL — ABNORMAL HIGH (ref 70–99)
Potassium: 4.5 mmol/L (ref 3.5–5.1)
Sodium: 137 mmol/L (ref 135–145)
Total Bilirubin: 1.4 mg/dL — ABNORMAL HIGH (ref 0.0–1.2)
Total Protein: 7 g/dL (ref 6.5–8.1)

## 2023-08-21 LAB — TROPONIN I (HIGH SENSITIVITY): Troponin I (High Sensitivity): 10 ng/L (ref <18)

## 2023-08-21 LAB — URINALYSIS, W/ REFLEX TO CULTURE (INFECTION SUSPECTED)
Bilirubin Urine: NEGATIVE
Glucose, UA: NEGATIVE mg/dL
Hgb urine dipstick: NEGATIVE
Ketones, ur: NEGATIVE mg/dL
Nitrite: POSITIVE — AB
Protein, ur: >=300 mg/dL — AB
Specific Gravity, Urine: 1.014 (ref 1.005–1.030)
pH: 6 (ref 5.0–8.0)

## 2023-08-21 LAB — BRAIN NATRIURETIC PEPTIDE: B Natriuretic Peptide: 142.5 pg/mL — ABNORMAL HIGH (ref 0.0–100.0)

## 2023-08-21 LAB — LIPASE, BLOOD: Lipase: 35 U/L (ref 11–51)

## 2023-08-21 MED ORDER — ONDANSETRON HCL 4 MG/2ML IJ SOLN
4.0000 mg | Freq: Once | INTRAMUSCULAR | Status: AC
  Administered 2023-08-21: 4 mg via INTRAVENOUS
  Filled 2023-08-21: qty 2

## 2023-08-21 MED ORDER — CIPROFLOXACIN HCL 500 MG PO TABS: 500.0000 mg | ORAL_TABLET | Freq: Two times a day (BID) | ORAL | 0 refills | Status: AC

## 2023-08-21 MED ORDER — ONDANSETRON 4 MG PO TBDP: 4.0000 mg | ORAL_TABLET | Freq: Three times a day (TID) | ORAL | 0 refills | Status: DC | PRN

## 2023-08-21 MED ORDER — FOSFOMYCIN TROMETHAMINE 3 G PO PACK
3.0000 g | PACK | Freq: Once | ORAL | Status: AC
  Administered 2023-08-21: 3 g via ORAL
  Filled 2023-08-21: qty 3

## 2023-08-21 NOTE — ED Triage Notes (Signed)
 Pt brought in from home by EMS for excessive urination. Per pt, she normally has excessive urination, urgency, and incontinence. Urinary symptoms today are worse - pt states she was unable to control bladder at all and was urinating constantly. Denies any new abdominal or back pain; denies any burning with urination.

## 2023-08-21 NOTE — Discharge Instructions (Signed)
 You are seen in the emergency department for increased urination and nausea.  You had findings concerning for urinary tract infection.  Your lab work otherwise looked okay.  Your urine was sent for culture.  You are given your first dose of antibiotics with fosfomycin in the emergency department.  You are given a prescription for antibiotics and a nausea medication, take as prescribed.  Call and follow-up closely with your primary care physician.  Return to the emergency department you have worsening symptoms or if you are unable to keep down your antibiotics.

## 2023-08-21 NOTE — ED Provider Notes (Signed)
 [name] Provider Note    Event Date/Time   First MD Initiated Contact with Patient 08/21/23 [phone]     (approximate)   History   Polyuria   HPI  [name] is a 78 y.o. female past medical history significant for hypertension, hyperlipidemia, diabetes, prior CVA, CKD, atrial fibrillation not on anticoagulation, presents to the emergency department with nausea and increased urination.  Patient states that she has constant increased urination and urinary incontinence.  States today it was significantly worse and she was having multiple episodes of frequent urination.  Also states that she was having nausea and worsening back pain.  States that she has chronic back pain and this feels consistent with her chronic pain.  Denies any chest pain or shortness of breath.  States that she does not take her Lasix as prescribed because makes her urinate too frequently.  Does state that she has had a history of urinary tract infections in the past.  Does have a history of resistant UTIs.     Physical Exam   Triage Vital Signs: ED Triage Vitals  Encounter Vitals Group     BP 08/21/23 0905 (!) 162/86     Systolic BP Percentile --      Diastolic BP Percentile --      Pulse Rate 08/21/23 0905 66     Resp 08/21/23 0905 20     Temp 08/21/23 0905 97.9 F (36.6 C)     Temp Source 08/21/23 0905 Oral     SpO2 08/21/23 0905 95 %     Weight 08/21/23 0907 200 lb (90.7 kg)     Height 08/21/23 0907 5\' 4"  (1.626 m)     Head Circumference --      Peak Flow --      Pain Score 08/21/23 0906 8     Pain Loc --      Pain Education --      Exclude from Growth Chart --     Most recent vital signs: Vitals:   08/21/23 1130 08/21/23 1200  BP: (!) 161/130 (!) 148/93  Pulse: 66 65  Resp: 13 17  Temp:    SpO2:  96%    Physical Exam Constitutional:      Appearance: She is well-developed.  HENT:     Head: Atraumatic.  Eyes:     Extraocular Movements: Extraocular  movements intact.     Conjunctiva/sclera: Conjunctivae normal.     Pupils: Pupils are equal, round, and reactive to light.  Cardiovascular:     Rate and Rhythm: Regular rhythm.  Pulmonary:     Effort: No respiratory distress.  Abdominal:     General: There is no distension.     Tenderness: There is no abdominal tenderness. There is no right CVA tenderness, left CVA tenderness or guarding.  Musculoskeletal:        General: Normal range of motion.     Cervical back: Normal range of motion.     Right lower leg: Edema present.     Left lower leg: Edema present.  Skin:    General: Skin is warm.     Capillary Refill: Capillary refill takes less than 2 seconds.  Neurological:     Mental Status: She is alert. Mental status is at baseline.     IMPRESSION / MDM / ASSESSMENT AND PLAN / ED COURSE  I reviewed the triage vital signs and the nursing notes.  Differential diagnosis including urinary tract infection, medication side effect  from Lasix use, dehydration, pyelonephritis, ACS  EKG  I, Viviano Ground, the attending physician, personally viewed and interpreted this ECG.   Rate: Normal  Rhythm: Normal sinus  Axis: Normal  Intervals: Normal  ST&T Change: None  No tachycardic or bradycardic dysrhythmias while on cardiac telemetry.  RADIOLOGY I independently reviewed imaging, my interpretation of imaging: Chest x-ray -no cardiomegaly.  LABS (all labs ordered are listed, but only abnormal results are displayed) Labs interpreted as -    Labs Reviewed  CBC - Abnormal; Notable for the following components:      Result Value   RBC 5.26 (*)    Hemoglobin 15.8 (*)    HCT 47.5 (*)    All other components within normal limits  COMPREHENSIVE METABOLIC PANEL WITH GFR - Abnormal; Notable for the following components:   Glucose, Bld 160 (*)    Creatinine, Ser 1.19 (*)    Albumin 3.3 (*)    Total Bilirubin 1.4 (*)    GFR, Estimated 47 (*)    All other components within normal limits   URINALYSIS, W/ REFLEX TO CULTURE (INFECTION SUSPECTED) - Abnormal; Notable for the following components:   Color, Urine YELLOW (*)    APPearance HAZY (*)    Protein, ur >=300 (*)    Nitrite POSITIVE (*)    Leukocytes,Ua MODERATE (*)    Bacteria, UA MANY (*)    All other components within normal limits  BRAIN NATRIURETIC PEPTIDE - Abnormal; Notable for the following components:   B Natriuretic Peptide 142.5 (*)    All other components within normal limits  URINE CULTURE  LIPASE, BLOOD  TROPONIN I (HIGH SENSITIVITY)     MDM  Patient given antiemetics.  Plan for lab work, EKG, troponin.  Will place a pure wick.  Clinical Course as of 08/21/23 1240  Sun Aug 21, 2023  1236 Patient states she is feeling much better.  Able to keep down her oral antibiotics and tolerating p.o.  No longer feeling nauseous.  After discussion with the pharmacist will do a one-time dose of fosfomycin and then discharge the patient on ciprofloxacin based on her culture data.  Repeat culture was sent today and is in process.  Patient has lab work on Tuesday and a follow-up appointment with her primary care physician scheduled for Thursday.  Discussed return to the emergency department for any ongoing or worsening symptoms or if she was unable to keep down her antibiotics.  Discussed eating hydrated and taking her medications as prescribed.  No questions at time of discharge. [SM]  1238 WBC, UA: 21-50 [SM]    Clinical Course User Index [SM] Viviano Ground, MD     PROCEDURES:  Critical Care performed: No  Procedures  Patient's presentation is most consistent with acute presentation with potential threat to life or bodily function.   MEDICATIONS ORDERED IN ED: Medications  ondansetron (ZOFRAN) injection 4 mg (4 mg Intravenous Given 08/21/23 0939)  fosfomycin (MONUROL) packet 3 g (3 g Oral Given 08/21/23 1210)    FINAL CLINICAL IMPRESSION(S) / ED DIAGNOSES   Final diagnoses:  Complicated UTI (urinary  tract infection)     Rx / DC Orders   ED Discharge Orders          Ordered    ondansetron (ZOFRAN-ODT) 4 MG disintegrating tablet  Every 8 hours PRN        08/21/23 1239    ciprofloxacin (CIPRO) 500 MG tablet  2 times daily        08/21/23  1239             Note:  This document was prepared using Dragon voice recognition software and may include unintentional dictation errors.   Viviano Ground, MD 08/21/23 1240

## 2023-08-23 DIAGNOSIS — M109 Gout, unspecified: Secondary | ICD-10-CM | POA: Diagnosis not present

## 2023-08-23 DIAGNOSIS — E1151 Type 2 diabetes mellitus with diabetic peripheral angiopathy without gangrene: Secondary | ICD-10-CM | POA: Diagnosis not present

## 2023-08-23 DIAGNOSIS — E782 Mixed hyperlipidemia: Secondary | ICD-10-CM | POA: Diagnosis not present

## 2023-08-23 DIAGNOSIS — Z79899 Other long term (current) drug therapy: Secondary | ICD-10-CM | POA: Diagnosis not present

## 2023-08-23 LAB — URINE CULTURE: Culture: >=100000 — AB

## 2023-08-25 DIAGNOSIS — Z0000 Encounter for general adult medical examination without abnormal findings: Secondary | ICD-10-CM | POA: Diagnosis not present

## 2023-08-25 DIAGNOSIS — M48061 Spinal stenosis, lumbar region without neurogenic claudication: Secondary | ICD-10-CM | POA: Diagnosis not present

## 2023-08-25 DIAGNOSIS — I69351 Hemiplegia and hemiparesis following cerebral infarction affecting right dominant side: Secondary | ICD-10-CM | POA: Diagnosis not present

## 2023-08-25 DIAGNOSIS — Z1331 Encounter for screening for depression: Secondary | ICD-10-CM | POA: Diagnosis not present

## 2023-08-25 DIAGNOSIS — E1122 Type 2 diabetes mellitus with diabetic chronic kidney disease: Secondary | ICD-10-CM | POA: Diagnosis not present

## 2023-08-25 DIAGNOSIS — M5116 Intervertebral disc disorders with radiculopathy, lumbar region: Secondary | ICD-10-CM | POA: Diagnosis not present

## 2023-08-25 DIAGNOSIS — N183 Chronic kidney disease, stage 3 unspecified: Secondary | ICD-10-CM | POA: Diagnosis not present

## 2023-10-13 DIAGNOSIS — N3090 Cystitis, unspecified without hematuria: Secondary | ICD-10-CM | POA: Diagnosis not present

## 2023-10-26 DIAGNOSIS — H35361 Drusen (degenerative) of macula, right eye: Secondary | ICD-10-CM | POA: Diagnosis not present

## 2023-10-26 DIAGNOSIS — E119 Type 2 diabetes mellitus without complications: Secondary | ICD-10-CM | POA: Diagnosis not present

## 2023-10-26 DIAGNOSIS — H35372 Puckering of macula, left eye: Secondary | ICD-10-CM | POA: Diagnosis not present

## 2023-10-31 ENCOUNTER — Ambulatory Visit
Admission: RE | Admit: 2023-10-31 | Discharge: 2023-10-31 | Disposition: A | Discharge status: Home / Self Care | Payer: Medicare HMO | Source: Ambulatory Visit | Attending: Internal Medicine | Admitting: Internal Medicine

## 2023-10-31 DIAGNOSIS — Z1231 Encounter for screening mammogram for malignant neoplasm of breast: Secondary | ICD-10-CM | POA: Diagnosis not present

## 2023-11-30 DIAGNOSIS — N3090 Cystitis, unspecified without hematuria: Secondary | ICD-10-CM | POA: Diagnosis not present

## 2023-11-30 DIAGNOSIS — E1165 Type 2 diabetes mellitus with hyperglycemia: Secondary | ICD-10-CM | POA: Diagnosis not present

## 2023-12-07 DIAGNOSIS — I482 Chronic atrial fibrillation, unspecified: Secondary | ICD-10-CM | POA: Diagnosis not present

## 2023-12-07 DIAGNOSIS — N390 Urinary tract infection, site not specified: Secondary | ICD-10-CM | POA: Diagnosis not present

## 2023-12-07 DIAGNOSIS — E1122 Type 2 diabetes mellitus with diabetic chronic kidney disease: Secondary | ICD-10-CM | POA: Diagnosis not present

## 2023-12-07 DIAGNOSIS — N183 Chronic kidney disease, stage 3 unspecified: Secondary | ICD-10-CM | POA: Diagnosis not present

## 2023-12-19 DIAGNOSIS — B353 Tinea pedis: Secondary | ICD-10-CM | POA: Diagnosis not present

## 2023-12-19 DIAGNOSIS — N390 Urinary tract infection, site not specified: Secondary | ICD-10-CM | POA: Diagnosis not present

## 2023-12-24 DIAGNOSIS — M75102 Unspecified rotator cuff tear or rupture of left shoulder, not specified as traumatic: Secondary | ICD-10-CM | POA: Diagnosis not present

## 2023-12-24 DIAGNOSIS — M12812 Other specific arthropathies, not elsewhere classified, left shoulder: Secondary | ICD-10-CM | POA: Diagnosis not present

## 2023-12-24 DIAGNOSIS — E119 Type 2 diabetes mellitus without complications: Secondary | ICD-10-CM | POA: Diagnosis not present

## 2024-01-06 DIAGNOSIS — E119 Type 2 diabetes mellitus without complications: Secondary | ICD-10-CM | POA: Diagnosis not present

## 2024-01-06 DIAGNOSIS — N3090 Cystitis, unspecified without hematuria: Secondary | ICD-10-CM | POA: Diagnosis not present

## 2024-01-06 DIAGNOSIS — I10 Essential (primary) hypertension: Secondary | ICD-10-CM | POA: Diagnosis not present

## 2024-01-06 NOTE — Patient Instructions (Signed)
 It was nice to meet you.  Lets plan on holding the supplement for 2 weeks.  I had like for you to check your blood pressure twice a day for the next 2 weeks and let us  know if it is consistently above 150/90.  Goal would be for it to be below 150/90 and more ideally less than 140/90.  Will check some labs today and make you aware of those results when we get them.  If you still are having the symptoms after stopping the supplement please follow-up.

## 2024-01-06 NOTE — Progress Notes (Signed)
 History of Present Illness:   [name] is a 78 y.o. female here for   Verbally consented to the use of AI for note-taking.   No chief complaint on file.     History of Present Illness [name] is a 78 year old female with hypertension who presents with episodes of high blood pressure and shakiness. She is accompanied by her son, [name].  She experienced high blood pressure this morning, which led her to call EMTs due to feeling shaky and nervous. She had not taken her blood pressure medication because she had not eaten breakfast, but took it without food, which helped reduce her blood pressure within ten minutes. By the afternoon, her blood pressure had decreased significantly.  She describes the sensation as 'real shaky inside,' similar to low blood sugar, although her blood sugar was 155. She has been feeling this way since Wednesday evening, with symptoms gradually worsening, leading her to seek medical attention today.  She has a history of urinary tract infections and recently completed a course of antibiotics. A urine sample was dropped off this morning. No urinary symptoms such as burning with urination are reported. She has been fighting UTIs for several months, having taken three rounds of antibiotics.  She has been taking a supplement called Accelerate for two months, which she believes may be contributing to her symptoms. The supplement is used for mood and weight loss, and she has noticed increased anxiety with its use. She has reduced the dosage on her daughter's advice.  She does not regularly check her blood sugars and was surprised by the recent symptoms. She is a type 2 diabetic with a well-controlled blood sugar level of 7.2 a month ago.   Past Medical History:   Past Medical History:  Diagnosis Date  . Diabetes mellitus type 2, uncomplicated (CMS/HHS-HCC)    Diet Controlled, No longer on Metformin   . H/O adenomatous polyp of colon 01/02/2015  . H/O  urinary incontinence 12/30/2014  . Hyperlipidemia   . Hypertension   . Osteoarthritis   . Recurrent nephrolithiasis    followed by Dr. Ike  . Sleep apnea    HX of Sleep Apnea, no longer indicated due to weight loss  . Stroke (CMS/HHS-HCC)    hx of CVA with mild residual right hemiparesis    Past Surgical History:   Past Surgical History:  Procedure Laterality Date  . COLONOSCOPY  07/20/2000   Adenomatous Polyp  . Left total knee arthroplasty  2004   Dr. Rubie  . COLONOSCOPY  01/09/2015   PH Adenomatous Polyp: CBF 01/2020  . BACK SURGERY  08/2016  . Right reverse total shoulder arthroplasty, biceps tendoesis Right 12/08/2020   Dr. Tobie  . Right total knee arthroplasty using computer-assisted navigation  11/06/2021   Dr Mardee  . Hx of lithotripsy and right kidney surgery secondary to stones    . Hx of thyroid /goiter surgery    . WISDOM TEETH      Allergies:   Allergies  Allergen Reactions  . Other Nausea And Vomiting  . Prednisone Hallucination    Current Medications:   Prior to Admission medications  Medication Sig Taking? Last Dose  allopurinoL  (ZYLOPRIM ) 100 MG tablet TAKE 1 TABLET EVERY DAY Yes Taking  amLODIPine  (NORVASC ) 5 MG tablet Take 1 tablet (5 mg total) by mouth once daily Yes Taking  cholecalciferol  (VITAMIN D3) 1000 unit tablet Take 1,000 Units by mouth once daily Per patient Yes Taking  clotrimazole-betamethasone  (LOTRISONE) 1-0.05 %  cream Apply topically 2 (two) times daily Yes Taking  cyanocobalamin , vitamin B-12, 500 mcg Lozg Take by mouth once daily. Yes Taking  FUROsemide  (LASIX ) 20 MG tablet TAKE 1 TABLET ONE TIME DAILY AS NEEDED FOR EDEMA Yes Taking  gabapentin  (NEURONTIN ) 100 MG capsule Take 1 capsule (100 mg total) by mouth at bedtime TAKE 1 CAPSULE IN THE MORNING AND TAKE 2 CAPSULES AT BEDTIME Yes Taking  glipiZIDE  (GLUCOTROL ) 10 MG tablet TAKE 1 TABLET (10 MG TOTAL) BY MOUTH EVERY MORNING BEFORE BREAKFAST Yes Taking  Herbal Supplement  Herbal Name: Apple cider,hormonal co drops. Yes Taking  loperamide  (IMODIUM ) 2 mg capsule Take 1 capsule (2 mg total) by mouth as needed FOR DIARRHEA Yes Taking  melatonin 5 mg Cap Take 1 tablet by mouth at bedtime as needed Yes Taking  metFORMIN  (GLUCOPHAGE ) 500 MG tablet Take 1 tablet (500 mg total) by mouth 3 (three) times daily Yes Taking  metoprolol  TARTrate (LOPRESSOR ) 100 MG tablet TAKE 1 TABLET EVERY DAY Yes Taking  nitrofurantoin (MACRODANTIN) 50 MG capsule Take 1 capsule (50 mg total) by mouth at bedtime Yes Taking  oxyCODONE  (ROXICODONE ) 5 MG immediate release tablet Take 1 tablet (5 mg total) by mouth once daily as needed for Pain for up to 30 days Yes Taking  rosuvastatin  (CRESTOR ) 10 MG tablet TAKE 1 TABLET EVERY DAY Patient taking differently: Takes once weekly. Yes Taking    Family History:   Family History  Problem Relation Name Age of Onset  . Stroke Mother    . Coronary Artery Disease (Blocked arteries around heart) Father    . Heart disease Brother      Social History:   Social History   Socioeconomic History  . Marital status: Married    Spouse name: Alvis  . Number of children: 2  . Years of education: 53  . Highest education level: High school graduate  Occupational History  . 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 Smoking status: Never  . Smokeless tobacco: Never  Vaping Use  . Vaping status: Never Used  Substance and Sexual Activity  . Alcohol use: Never    Comment: OCCASIONAL  . Drug use: Defer  . Sexual activity: Not Currently    Partners: Male  Social History Narrative   Lives in town with spouse.   Social Drivers of Health   Financial Resource Strain: Low Risk  (08/25/2023)   Overall Financial Resource Strain (CARDIA)   . Difficulty of Paying Living Expenses: Not hard at all  Food Insecurity: No Food Insecurity (08/25/2023)   Hunger Vital Sign   . Worried About Programme researcher, broadcasting/film/video in the Last Year: Never true    . Ran Out of Food in the Last Year: Never true  Transportation Needs: No Transportation Needs (08/25/2023)   PRAPARE - Transportation   . Lack of Transportation (Medical): No   . Lack of Transportation (Non-Medical): No  Housing Stability: Low Risk  (08/25/2023)   Housing Stability Vital Sign   . Unable to Pay for Housing in the Last Year: No   . Number of Times Moved in the Last Year: 0   . Homeless in the Last Year: No    Review of Systems:   A 10 point review of systems is negative, except for the pertinent positives and negatives detailed in the HPI.  Vitals:   Vitals:   01/06/24 1544  BP: (!) 142/70  Pulse: 73  SpO2: 96%  Weight: 83.8 kg (  184 lb 12.8 oz)     Body mass index is 31.72 kg/m.  Physical Exam:   Physical Exam Vitals and nursing note reviewed.  Constitutional:      General: She is not in acute distress.    Appearance: Normal appearance. She is not ill-appearing, toxic-appearing or diaphoretic.  HENT:     Head: Normocephalic and atraumatic.     Right Ear: External ear normal.     Left Ear: External ear normal.  Eyes:     Conjunctiva/sclera: Conjunctivae normal.  Cardiovascular:     Rate and Rhythm: Normal rate and regular rhythm.     Pulses: Normal pulses.     Heart sounds: Normal heart sounds.  Pulmonary:     Effort: Pulmonary effort is normal.     Breath sounds: Normal breath sounds.  Neurological:     Mental Status: She is alert.     Assessment and Plan:  No results found for this visit on 01/06/24.   Assessment & Plan Essential hypertension Blood pressure elevated this morning but currently controlled. - Check blood pressure twice daily, morning and evening. - Report if blood pressure consistently above 150/90 mmHg. - Order CBC and CMP to rule out other causes of symptoms. - Since blood pressure is well-controlled we will hold off on changing blood pressure medicine  Anxiety symptoms possibly related to supplement use Symptoms of  shakiness and anxiety likely due to 'Accelerate' supplement containing stimulants and caffeine. Possible relation to hypertension or thyroid  issues. - Discontinue 'Accelerate' for two weeks to assess symptom improvement. - Order TSH to rule out thyroid  issues. - Follow up if symptoms persist after discontinuing the supplement. - Symptoms possibly related to underlying anxiety.  Will hold off on treatment at this time  Type 2 diabetes mellitus, well controlled Blood sugar 155 mg/dL, unlikely cause of symptoms. Hemoglobin A1c 7.2% a month ago indicates well-controlled diabetes. - Continue to monitor any symptoms of low blood sugar History of recurrent urinary tract infection No current signs of infection. Recent urine sample showed protein and glucose, no bacteria. Completed antibiotics. - Verify urine culture results to confirm no bacterial infection.  Disposition: Follow-up as needed.  Patient Instructions  It was nice to meet you.  Lets plan on holding the supplement for 2 weeks.  I had like for you to check your blood pressure twice a day for the next 2 weeks and let us  know if it is consistently above 150/90.  Goal would be for it to be below 150/90 and more ideally less than 140/90.  Will check some labs today and make you aware of those results when we get them.  If you still are having the symptoms after stopping the supplement please follow-up.   This note has been created using automated tools and reviewed for accuracy by provider.  Patient received an After Visit Summary    Attestation Statement:   I personally performed the service, non-incident to. (WP)   MASON MCCLELLAND MINOR, PA

## 2024-01-09 ENCOUNTER — Emergency Department

## 2024-01-09 ENCOUNTER — Encounter: Payer: Self-pay | Admitting: Intensive Care

## 2024-01-09 ENCOUNTER — Other Ambulatory Visit: Payer: Self-pay

## 2024-01-09 ENCOUNTER — Emergency Department: Admission: EM | Admit: 2024-01-09 | Discharge: 2024-01-09 | Disposition: A | Discharge status: Home / Self Care

## 2024-01-09 DIAGNOSIS — R350 Frequency of micturition: Secondary | ICD-10-CM | POA: Insufficient documentation

## 2024-01-09 DIAGNOSIS — R531 Weakness: Secondary | ICD-10-CM | POA: Diagnosis not present

## 2024-01-09 DIAGNOSIS — R002 Palpitations: Secondary | ICD-10-CM | POA: Diagnosis present

## 2024-01-09 DIAGNOSIS — E1122 Type 2 diabetes mellitus with diabetic chronic kidney disease: Secondary | ICD-10-CM | POA: Diagnosis not present

## 2024-01-09 DIAGNOSIS — Z96611 Presence of right artificial shoulder joint: Secondary | ICD-10-CM | POA: Diagnosis not present

## 2024-01-09 DIAGNOSIS — I129 Hypertensive chronic kidney disease with stage 1 through stage 4 chronic kidney disease, or unspecified chronic kidney disease: Secondary | ICD-10-CM | POA: Insufficient documentation

## 2024-01-09 DIAGNOSIS — N189 Chronic kidney disease, unspecified: Secondary | ICD-10-CM | POA: Diagnosis not present

## 2024-01-09 DIAGNOSIS — R079 Chest pain, unspecified: Secondary | ICD-10-CM | POA: Insufficient documentation

## 2024-01-09 DIAGNOSIS — R110 Nausea: Secondary | ICD-10-CM | POA: Diagnosis not present

## 2024-01-09 DIAGNOSIS — Z8673 Personal history of transient ischemic attack (TIA), and cerebral infarction without residual deficits: Secondary | ICD-10-CM | POA: Diagnosis not present

## 2024-01-09 LAB — BRAIN NATRIURETIC PEPTIDE: B Natriuretic Peptide: 102.3 pg/mL — ABNORMAL HIGH (ref 0.0–100.0)

## 2024-01-09 LAB — URINALYSIS, COMPLETE (UACMP) WITH MICROSCOPIC
Bacteria, UA: NONE SEEN
Bilirubin Urine: NEGATIVE
Glucose, UA: NEGATIVE mg/dL
Hgb urine dipstick: NEGATIVE
Ketones, ur: NEGATIVE mg/dL
Leukocytes,Ua: NEGATIVE
Nitrite: NEGATIVE
Protein, ur: 100 mg/dL — AB
RBC / HPF: 0 RBC/hpf (ref 0–5)
Specific Gravity, Urine: 1.01 (ref 1.005–1.030)
pH: 6 (ref 5.0–8.0)

## 2024-01-09 LAB — RESP PANEL BY RT-PCR (RSV, FLU A&B, COVID)  RVPGX2
Influenza A by PCR: NEGATIVE
Influenza B by PCR: NEGATIVE
Resp Syncytial Virus by PCR: NEGATIVE
SARS Coronavirus 2 by RT PCR: NEGATIVE

## 2024-01-09 LAB — CBC
HCT: 50 % — ABNORMAL HIGH (ref 36.0–46.0)
Hemoglobin: 16.9 g/dL — ABNORMAL HIGH (ref 12.0–15.0)
MCH: 30.8 pg (ref 26.0–34.0)
MCHC: 33.8 g/dL (ref 30.0–36.0)
MCV: 91.1 fL (ref 80.0–100.0)
Platelets: 261 K/uL (ref 150–400)
RBC: 5.49 MIL/uL — ABNORMAL HIGH (ref 3.87–5.11)
RDW: 13.4 % (ref 11.5–15.5)
WBC: 9.1 K/uL (ref 4.0–10.5)
nRBC: 0 % (ref 0.0–0.2)

## 2024-01-09 LAB — HEPATIC FUNCTION PANEL
ALT: 45 U/L — ABNORMAL HIGH (ref 0–44)
AST: 32 U/L (ref 15–41)
Albumin: 3.3 g/dL — ABNORMAL LOW (ref 3.5–5.0)
Alkaline Phosphatase: 48 U/L (ref 38–126)
Bilirubin, Direct: 0.1 mg/dL (ref 0.0–0.2)
Indirect Bilirubin: 0.8 mg/dL (ref 0.3–0.9)
Total Bilirubin: 0.9 mg/dL (ref 0.0–1.2)
Total Protein: 6.6 g/dL (ref 6.5–8.1)

## 2024-01-09 LAB — BASIC METABOLIC PANEL WITH GFR
Anion gap: 11 (ref 5–15)
BUN: 18 mg/dL (ref 8–23)
CO2: 22 mmol/L (ref 22–32)
Calcium: 9.2 mg/dL (ref 8.9–10.3)
Chloride: 102 mmol/L (ref 98–111)
Creatinine, Ser: 1.2 mg/dL — ABNORMAL HIGH (ref 0.44–1.00)
GFR, Estimated: 46 mL/min — ABNORMAL LOW (ref >60)
Glucose, Bld: 187 mg/dL — ABNORMAL HIGH (ref 70–99)
Potassium: 3.8 mmol/L (ref 3.5–5.1)
Sodium: 135 mmol/L (ref 135–145)

## 2024-01-09 LAB — LIPASE, BLOOD: Lipase: 64 U/L — ABNORMAL HIGH (ref 11–51)

## 2024-01-09 LAB — TROPONIN I (HIGH SENSITIVITY): Troponin I (High Sensitivity): 11 ng/L (ref <18)

## 2024-01-09 LAB — PROCALCITONIN: Procalcitonin: <0.1 ng/mL

## 2024-01-09 MED ORDER — SODIUM CHLORIDE 0.9 % IV BOLUS
500.0000 mL | Freq: Once | INTRAVENOUS | Status: AC
  Administered 2024-01-09: 500 mL via INTRAVENOUS

## 2024-01-09 NOTE — ED Triage Notes (Signed)
 Patient presents today with new onset nausea and jitteriness that has been ongoing for a week. States she started having central and left chest pain yesterday. Reports recent change in blood pressure medicine.   Family also reports patient has history of recurrent UTI's. A few days ago had blood drawn and gave urine sample but has not heard from MD about results.  A&O x4 in triage   Also states she has been having left arm/shoulder pain and has had Cortizone shot for arthritis

## 2024-01-09 NOTE — Discharge Instructions (Addendum)
 You were seen in our emergency department for many symptoms including intermittent nausea, palpitations, chest pain.  Workup today was reassuring but this does not mean that nothing is wrong but whether you are safe to continue the workup as an outpatient.  Please call your primary care physician and have them review your workup today.  Of note, your lipase which is a marker of your pancreas was mildly elevated today at 64, likely secondary to nausea.  This is barely over normal (and viruses can elevate) but should be trended by your primary care physician.  Continue your normal medications and return with any acutely worsening symptoms or any other emergency. == RETURN PRECAUTIONS & AFTERCARE: (ENGLISH) RETURN PRECAUTIONS: Return immediately to the emergency department or see/call your doctor if you feel worse, weak or have changes in speech or vision, are short of breath, have fever, vomiting, pain, bleeding or dark stool, trouble urinating or any new issues. Return here or see/call your doctor if not improving as expected for your suspected condition. FOLLOW-UP CARE: Call your doctor and/or any doctors we referred you to for more advice and to make an appointment. Do this today, tomorrow or after the weekend. Some doctors only take PPO insurance so if you have HMO insurance you may want to contact your HMO or your regular doctor for referral to a specialist within your plan. Either way tell the doctor's office that it was a referral from the emergency department so you get the soonest possible appointment.  YOUR TEST RESULTS: Take result reports of any blood or urine tests, imaging tests and EKG's to your doctor and any referral doctor. Have any abnormal tests repeated. Your doctor or a referral doctor can let you know when this should be done. Also make sure your doctor contacts this hospital to get any test results that are not currently available such as cultures or special tests for infection and final  imaging reports, which are often not available at the time you leave the ER but which may list additional important findings that are not documented on the preliminary report. BLOOD PRESSURE: If your blood pressure was greater than 120/80 have your blood pressure rechecked within 1 to 2 weeks. MEDICATION SIDE EFFECTS: Do not drive, walk, bike, take the bus, etc. if you have received or are being prescribed any sedating medications such as those for pain or anxiety or certain antihistamines like Benadryl . If you have been give one of these here get a taxi home or have a friend drive you home. Ask your pharmacist to counsel you on potential side effects of any new medication

## 2024-01-09 NOTE — ED Notes (Signed)
Lab contacted to send phlebotomist

## 2024-01-09 NOTE — ED Provider Notes (Signed)
 [facility] Provider Note    Event Date/Time   First MD Initiated Contact with Patient 01/09/24 1004     (approximate)   History   Nausea and Chest Pain   HPI  [name] is a 78 y.o. female  . female past medical history significant for hypertension, hyperlipidemia, diabetes, prior CVA, CKD, atrial fibrillation not on anticoagulation who presents to the emergency department with several concerns including 1 week of nausea, jitteriness, congestion, chest pain, changes in blood pressure and increased weakness.  Patient presents with her daughter who helps contribute to the history.  Patient reports that she just feels off.  Denies any falls or any extremity weakness or sensation changes or headache.  Daughter wonders whether the patient could have COVID.  She was seen by her primary care physician on Friday, but does not know the results of the blood work she had completed.  Denies any chest pain currently while lying in bed but states that she has the jitters.  Denies any abdominal pain or changes in bowel habits      Physical Exam   Triage Vital Signs: ED Triage Vitals  Encounter Vitals Group     BP 01/09/24 0943 (!) 167/100     Girls Systolic BP Percentile --      Girls Diastolic BP Percentile --      Boys Systolic BP Percentile --      Boys Diastolic BP Percentile --      Pulse Rate 01/09/24 0943 71     Resp 01/09/24 0943 18     Temp 01/09/24 0941 98.1 F (36.7 C)     Temp Source 01/09/24 0941 Oral     SpO2 01/09/24 0943 97 %     Weight 01/09/24 0944 184 lb (83.5 kg)     Height 01/09/24 0944 5' 2 (1.575 m)     Head Circumference --      Peak Flow --      Pain Score 01/09/24 0943 9     Pain Loc --      Pain Education --      Exclude from Growth Chart --     Most recent vital signs: Vitals:   01/09/24 1100 01/09/24 1200  BP: 120/74 (!) 141/77  Pulse: 65 64  Resp: 20 13  Temp:    SpO2: 98% 100%    Nursing Triage Note  reviewed. Vital signs reviewed and patients oxygen saturation is normoxic  General: Patient is well nourished, well developed, awake and alert, resting comfortably in no acute distress Head: Normocephalic and atraumatic Eyes: Normal inspection, extraocular muscles intact, no conjunctival pallor Ear, nose, throat: Normal external exam Neck: Normal range of motion Respiratory: Patient is in no respiratory distress, lungs CTAB Cardiovascular: Patient is not tachycardic, RRR without murmur appreciated GI: Abd SNT with no guarding or rebound  Back: Normal inspection of the back with good strength and range of motion throughout all ext Extremities: pulses intact with good cap refills, no LE pitting edema or calf tenderness Neuro: The patient is alert and oriented to person, place, and time, appropriately conversive, with 5/5 bilat UE/LE strength, no gross motor or sensory defects noted. Coordination appears to be adequate. Skin: Warm, dry, and intact Psych: normal mood and affect, no SI or HI  ED Results / Procedures / Treatments   Labs (all labs ordered are listed, but only abnormal results are displayed) Labs Reviewed  BASIC METABOLIC PANEL WITH GFR - Abnormal; Notable for  the following components:      Result Value   Glucose, Bld 187 (*)    Creatinine, Ser 1.20 (*)    GFR, Estimated 46 (*)    All other components within normal limits  CBC - Abnormal; Notable for the following components:   RBC 5.49 (*)    Hemoglobin 16.9 (*)    HCT 50.0 (*)    All other components within normal limits  BRAIN NATRIURETIC PEPTIDE - Abnormal; Notable for the following components:   B Natriuretic Peptide 102.3 (*)    All other components within normal limits  HEPATIC FUNCTION PANEL - Abnormal; Notable for the following components:   Albumin 3.3 (*)    ALT 45 (*)    All other components within normal limits  LIPASE, BLOOD - Abnormal; Notable for the following components:   Lipase 64 (*)    All other  components within normal limits  URINALYSIS, COMPLETE (UACMP) WITH MICROSCOPIC - Abnormal; Notable for the following components:   Color, Urine YELLOW (*)    APPearance CLEAR (*)    Protein, ur 100 (*)    All other components within normal limits  RESP PANEL BY RT-PCR (RSV, FLU A&B, COVID)  [id number]  PROCALCITONIN  TROPONIN I (HIGH SENSITIVITY)     EKG EKG and rhythm strip are interpreted by myself:   EKG: [Normal sinus rhythm] at heart rate of 68, RBB, wide QRS duration, QTc 467, nonspecific ST segments and T waves no ectopy EKG not consistent with Acute STEMI Rhythm strip: NSR in lead II   RADIOLOGY XR chest: No acute abnormality on my independent review interpretation radiologist agrees    PROCEDURES:  Critical Care performed: No  Procedures   MEDICATIONS ORDERED IN ED: Medications  sodium chloride  0.9 % bolus 500 mL (0 mLs Intravenous Stopped 01/09/24 1215)     IMPRESSION / MDM / ASSESSMENT AND PLAN / ED COURSE                                Differential diagnosis includes, but is not limited to, anemia, URI, arrhythmia, UTI, electrolyte derangement, atypical ACS   ED course: It is difficult to find a unifying diagnosis for all of the patient's ongoing symptoms but reassured that she is neurovascularly intact without any focal deficits not hypoxic with stable blood pressure here without intervention.  She is not profoundly anemic and has no profound electrolyte derangements.  Her abdominal exam is completely benign on presentation.  She did have a slightly elevated lipase however no pain and consequently I do not feel that emergent imaging is warranted but she was counseled to follow-up with this as an outpatient with her primary care doctor.  Urinalysis did not demonstrate UTI and her COVID test was negative along with a chest x-ray.  She had a troponin that was not elevated despite the chronicity of her symptoms.  Patient was able to ambulate and take p.o..  She and  her daughter were counseled that at this time I did not have a cause for her symptoms but was reassured by the workup.  I encouraged her to continue to work with her primary care physician and to return with any acutely worsening symptoms or any other emergency and patient and patient's daughter voiced understanding   Clinical Course as of 01/09/24 1539  Mon Jan 09, 2024  1104 WBC: 9.1 No leukocytosis [HD]  1104 HCT(!): 50.0 No acute anemia [HD]  1125 Urinalysis, Complete w Microscopic -Urine, Clean Catch(!) Urinalysis does not seem consistent with UTI [HD]  1125 B Natriuretic Peptide(!): 102.3 BNP is better than baseline of 140 [HD]  1152 Resp panel by RT-PCR (RSV, Flu A&B, Covid) Anterior Nasal Swab Not elevated [HD]  02-07-17 I reviewed the workup with the patient and daughter.  I counseled them that I did not have a cause for her symptoms right now.  I felt that she was stable to return home.  Patient's lipase is mildly elevated however she has no abdominal pain at this time.  I encouraged her to call her primary care physician's office tomorrow.  Reviewed workup.  All return precautions given and patient and daughter voiced understanding.  Patient took p.o. [HD]    Clinical Course User Index [HD] Nicholaus Rolland BRAVO, MD   At time of discharge there is no evidence of acute life, limb, vision, or fertility threat. Patient has stable vital signs, pain is well controlled, patient is ambulatory and p.o. tolerant.  Discharge instructions were completed using the [employer] system. I would refer you to those at this time. All warnings prescriptions follow-up etc. were discussed in detail with the patient. Patient indicates understanding and is agreeable with this plan. All questions answered.  Patient is made aware that they may return to the emergency department for any worsening or new condition or for any other emergency. -- Risk: 5 This patient has a high risk of morbidity due to further diagnostic  testing or treatment. Rationale: This patient's evaluation and management involve a high risk of morbidity due to the potential severity of presenting symptoms, need for diagnostic testing, and/or initiation of treatment that may require close monitoring. The differential includes conditions with potential for significant deterioration or requiring escalation of care. Treatment decisions in the ED, including medication administration, procedural interventions, or disposition planning, reflect this level of risk. Additional Support: -- Drug therapy requiring intensive monitoring for toxicity [ ]  -- Decision regarding elective major surgery with idenitified patient or procedure risk factors [ ]  -- Decision regarding hospitalization or escalation of hospital-level care [ ]  -- Decision not to resuscitate or to de-escalate care because of poor prognosis [ ]  -- Parental controlled substances [ ]   COPA: 5 The patient has a severe exacerbation, progression, or side effect of treatment of the following illness/illnesses: []  OR  The patient has the following acute or chronic illness/injury that poses a possible threat to life or bodily function: [X] : The patient has a potentially serious acute condition or an acute exacerbation of a chronic illness requiring urgent evaluation and management in the Emergency Department. The clinical presentation necessitates immediate consideration of life-threatening or function-threatening diagnoses, even if they are ultimately ruled out.  Data(2/3 categories following were performed): 5 I reviewed or ordered at least three unique tests, external notes, and/or the history required an independent historian as one of the three requirements as following: CBC, troponin, CMP, daughter AND  I independently interpreted the following test: X-ray chest OR  I discussed the management of the patient with the following external physician or qualified healthcare provider:  []   Suggested E/M Coding Level: 5, 99285, This has been selected based on the February 07, 2022 CPT guidelines for E/M codes in the Emergency Department based on 2/3 of the CoPA, Data, and Risk.   FINAL CLINICAL IMPRESSION(S) / ED DIAGNOSES   Final diagnoses:  General weakness  Palpitations  Urinary frequency     Rx / DC Orders   ED Discharge  Orders     None        Note:  This document was prepared using Dragon voice recognition software and may include unintentional dictation errors.   Nicholaus Rolland BRAVO, MD 01/09/24 1539

## 2024-01-09 NOTE — ED Notes (Signed)
Incontinence brief changed and perineal care performed.

## 2024-01-26 NOTE — Progress Notes (Signed)
 As per my note, patient presented with nausea, congestion signs of Covid.   How do I write in my H&P to ensure I dont continue to receive these coding requests in the future? Please call me at [phone] or write back. Thank you

## 2024-02-20 ENCOUNTER — Ambulatory Visit
Admission: EM | Admit: 2024-02-20 | Discharge: 2024-02-20 | Disposition: A | Discharge status: Home / Self Care | Attending: Emergency Medicine | Admitting: Emergency Medicine

## 2024-02-20 DIAGNOSIS — B372 Candidiasis of skin and nail: Secondary | ICD-10-CM | POA: Diagnosis not present

## 2024-02-20 MED ORDER — NYSTATIN 100000 UNIT/GM EX CREA: TOPICAL_CREAM | CUTANEOUS | 2 refills | Status: DC

## 2024-02-20 MED ORDER — MUPIROCIN 2 % EX OINT: 1.0000 | TOPICAL_OINTMENT | Freq: Two times a day (BID) | CUTANEOUS | 0 refills | Status: DC

## 2024-02-20 NOTE — ED Triage Notes (Addendum)
 Patient to Urgent Care with complaints for a wound check on the two wounds on the bottom of her tail bone. Reports foul smell and significant pain.   Symptoms started Thursday and started causing more pain on Saturday. Limited mobility and sleeps in a chiar.  Using hot compress/ antibiotic ointment/ using a pillow between her legs.

## 2024-02-20 NOTE — Discharge Instructions (Addendum)
 Use the nystatin cream as directed for your skin yeast rash.  Use the mupirocin ointment on the open sores.    Follow up with your primary care provider tomorrow.  Go to the emergency department if you have worsening symptoms.

## 2024-02-20 NOTE — ED Provider Notes (Signed)
 CAY RALPH PELT    CSN: [id number] Arrival date & time: 02/20/24  1543      History   Chief Complaint Chief Complaint  Patient presents with   Wound Check    HPI [name] is a 78 y.o. female.  Accompanied by her daughter, patient presents with a painful rash and sores in her gluteal cleft x 5 days.  She has been treating the area with warm compresses and an antibiotic ointment (unknown name).  She denies fever or drainage.  Patient has limited mobility and reports history of frequent sores that she is usually able to heal on her own.  The history is provided by the patient, a relative and medical records.    Past Medical History:  Diagnosis Date   A-fib (HCC)    back in 2013, for stent placement in kidney, she had a bout of a-fib.  Now, back in rhythm   Arthritis    Chronic kidney disease    Complication of anesthesia    Diabetes mellitus without complication (HCC)    type 2    only takes metformin    Dyspnea    Dysuria    History of kidney stones    Hypertension    Osteopenia    PONV (postoperative nausea and vomiting)    Sleep apnea    lost over 120 lbs, and no long uses it (close to 2 yrs now)   Stroke [facility])    mini 2003--affected right side of body.   took 6 weeks to get over.   Urine, incontinence, stress female     Patient Active Problem List   Diagnosis Date Noted   Dehydration 09/11/2022   PUD (peptic ulcer disease) 09/10/2022   HLD (hyperlipidemia) 09/10/2022   Type II diabetes mellitus with renal manifestations (HCC) 09/10/2022   Gout 09/10/2022   Anxiety 09/10/2022   Obesity (BMI 30-39.9) 09/10/2022   Atrial fibrillation, chronic (HCC) 09/10/2022   Shingles 09/10/2022   UTI (urinary tract infection) 09/10/2022   Pancreatic cyst 09/10/2022   Elevated LFTs 09/10/2022   Myocardial injury 09/10/2022   Fall at home, initial encounter 09/10/2022   Intractable nausea and vomiting 09/10/2022   Hyperlipidemia, mixed 11/06/2021   Total  knee replacement status 11/06/2021   Primary osteoarthritis of right knee 10/04/2021   Status post reverse arthroplasty of right shoulder 02/11/2021   Rotator cuff arthropathy 12/08/2020   Pressure injury of skin 12/08/2020   Atherosclerosis of abdominal aorta 01/16/2020   Calculus of hepatic duct    Diabetes mellitus with peripheral angiopathy (HCC) 01/12/2018   Osteoarthritis 07/09/2016   Recurrent nephrolithiasis 07/09/2016   Benign essential hypertension 07/05/2016   History of CVA (cerebrovascular accident) 07/05/2016   Medicare annual wellness visit, initial 07/05/2016   Spinal stenosis of lumbar region with neurogenic claudication 04/19/2016   OSA on CPAP 12/29/2015   CKD (chronic kidney disease) stage 4, GFR 15-29 ml/min (HCC) 05/29/2015   H/O adenomatous polyp of colon 01/02/2015   Polyarticular gout 05/22/2014   Diabetes mellitus type 2, controlled, with complications (HCC) 02/25/2014   Foreign body in bladder and urethra 08/31/2013   Uric acid nephrolithiasis 08/31/2013   Kidney stone 08/09/2013   Ureteric stone 08/09/2013   Hydronephrosis 08/09/2013   Renal colic 08/09/2013    Past Surgical History:  Procedure Laterality Date   BREAST BIOPSY Right 11/22/2019   U/S Bx-HYDROMARK-BENIGN INTRAMAMMARY LYMPH NODE.   COLONOSCOPY W/ POLYPECTOMY Right    COLONOSCOPY WITH PROPOFOL  N/A 01/09/2015   Procedure: COLONOSCOPY  WITH PROPOFOL ;  Surgeon: Lamar ONEIDA Holmes, MD;  Location: [facility] ENDOSCOPY;  Service: Endoscopy;  Laterality: N/A;   ERCP N/A 03/15/2019   Procedure: ENDOSCOPIC RETROGRADE CHOLANGIOPANCREATOGRAPHY (ERCP);  Surgeon: Jinny Carmine, MD;  Location: [facility] ENDOSCOPY;  Service: Endoscopy;  Laterality: N/A;   EYE SURGERY  2013   bilateral cataracts with implants   JOINT REPLACEMENT     left knee  TKR   KIDNEY STONE SURGERY     40 yrs ago, while pregnant, she had to 'be cut in 1/2 to get stone out'   KNEE ARTHROPLASTY Right 11/06/2021   Procedure: COMPUTER ASSISTED  TOTAL KNEE ARTHROPLASTY - DEPUY;  Surgeon: Mardee Lynwood SQUIBB, MD;  Location: [facility] ORS;  Service: Orthopedics;  Laterality: Right;   KNEE ARTHROSCOPY     left knee   LITHOTRIPSY     ltk Left    LUMBAR LAMINECTOMY/DECOMPRESSION MICRODISCECTOMY N/A 09/06/2016   Procedure: LAMINECTOMY AND FORAMINOTOMY LUMBAR THREE- LUMBAR FOUR, LUMBAR FOUR- LUMBAR FIVE;  Surgeon: Reyes Budge, MD;  Location: [facility] OR;  Service: Neurosurgery;  Laterality: N/A;  LAMINECTOMY AND FORAMINOTOMY LUMBAR 3- LUMBAR 4, LUMBAR 4- LUMBAR 5    REVERSE SHOULDER ARTHROPLASTY Right 12/08/2020   Procedure: Right reverse total shoulder arthroplasty and  biceps tenodesis;  Surgeon: Tobie Priest, MD;  Location: [facility] ORS;  Service: Orthopedics;  Laterality: Right;   throidectomy  1979   goiter   TRIGGER FINGER RELEASE Left     OB History   No obstetric history on file.      Home Medications    Prior to Admission medications   Medication Sig Start Date End Date Taking? Authorizing Provider  mupirocin ointment (BACTROBAN) 2 % Apply 1 Application topically 2 (two) times daily. 02/20/24  Yes Corlis Burnard DEL, NP  nystatin cream (MYCOSTATIN) Apply to affected area 2 times daily 02/20/24  Yes Corlis Burnard DEL, NP  acetaminophen  (TYLENOL ) 500 MG tablet Take 500 mg by mouth every 6 (six) hours as needed.    [provider]  allopurinol  (ZYLOPRIM ) 100 MG tablet Take 100 mg by mouth daily.    [provider]  ALPRAZolam  (XANAX ) 0.5 MG tablet Take 0.5 mg by mouth 2 (two) times daily as needed for anxiety.    [provider]  amLODipine  (NORVASC ) 10 MG tablet Take 10 mg by mouth daily.  07/05/16   [provider]  aspirin  EC 81 MG tablet Take 81 mg by mouth daily. Swallow whole.    [provider]  Chlorpheniramine-DM 4-30 MG TABS Take by mouth.    [provider]  Cholecalciferol  (VITAMIN D3) 50 MCG (2000 UT) TABS Take 2,000 Units by mouth daily.    [provider]  furosemide   (LASIX ) 20 MG tablet Take 20 mg by mouth daily as needed for edema.    [provider]  gabapentin  (NEURONTIN ) 100 MG capsule Take 100 mg by mouth 2 (two) times daily. 08/22/18   [provider]  glipiZIDE  (GLUCOTROL ) 5 MG tablet Take 5 mg by mouth daily before breakfast.    [provider]  loperamide  (IMODIUM ) 2 MG capsule Take by mouth as needed for diarrhea or loose stools.    [provider]  Melatonin 5 MG CAPS Take 1 tablet by mouth at bedtime as needed for sleep.    [provider]  metFORMIN  (GLUCOPHAGE ) 500 MG tablet Take 1 tablet by mouth 3 (three) times daily. 06/12/21   [provider]  metoprolol  (LOPRESSOR ) 100 MG tablet Take 100 mg by  mouth daily.     [provider]  ondansetron  (ZOFRAN -ODT) 4 MG disintegrating tablet Take 1 tablet (4 mg total) by mouth every 8 (eight) hours as needed for nausea or vomiting. 08/21/23   Suzanne Kirsch, MD  OVER THE COUNTER MEDICATION doTerra for healthy immnune system    [provider]  oxyCODONE  (OXY IR/ROXICODONE ) 5 MG immediate release tablet Take 1 tablet (5 mg total) by mouth every 4 (four) hours as needed for moderate pain (pain score 4-6). 11/07/21   Verlinda Boas, PA-C  pantoprazole  (PROTONIX ) 40 MG tablet Take 1 tablet (40 mg total) by mouth daily. 09/12/22   Patel, Sona, MD  rosuvastatin  (CRESTOR ) 10 MG tablet Take 10 mg by mouth daily.    [provider]  Semaglutide (OZEMPIC, 0.25 OR 0.5 MG/DOSE, [place]) Inject 0.5 mg into the skin every 7 (seven) days.    [provider]  vitamin B-12 (CYANOCOBALAMIN ) 500 MCG tablet Take 500 mcg by mouth daily.    [provider]    Family History Family History  Problem Relation Age of Onset   Breast cancer Neg Hx     Social History Social History   Tobacco Use   Smoking status: Never   Smokeless tobacco: Never  Vaping Use   Vaping status: Never Used  Substance Use Topics   Alcohol use: No   Drug use:  Yes    Comment: prescribed oxycodone      Allergies   Prednisone, Lactose intolerance (gi), Morphine  and codeine, and Vicodin [hydrocodone-acetaminophen ]   Review of Systems Review of Systems  Constitutional:  Negative for chills and fever.  Skin:  Positive for color change, rash and wound.     Physical Exam Triage Vital Signs ED Triage Vitals [02/20/24 1634]  Encounter Vitals Group     BP 117/77     Girls Systolic BP Percentile      Girls Diastolic BP Percentile      Boys Systolic BP Percentile      Boys Diastolic BP Percentile      Pulse Rate 93     Resp 18     Temp 98.1 F (36.7 C)     Temp src      SpO2 95 %     Weight      Height      Head Circumference      Peak Flow      Pain Score      Pain Loc      Pain Education      Exclude from Growth Chart    No data found.  Updated Vital Signs BP 117/77   Pulse 93   Temp 98.1 F (36.7 C)   Resp 18   SpO2 95%   Visual Acuity Right Eye Distance:   Left Eye Distance:   Bilateral Distance:    Right Eye Near:   Left Eye Near:    Bilateral Near:     Physical Exam Constitutional:      General: She is not in acute distress. HENT:     Mouth/Throat:     Mouth: Mucous membranes are moist.  Cardiovascular:     Rate and Rhythm: Normal rate.  Pulmonary:     Effort: Pulmonary effort is normal. No respiratory distress.  Skin:    General: Skin is warm and dry.     Findings: Erythema, lesion and rash present.     Comments: Gluteal cleft: Brightly erythematous rash with some superficial skin breakdown on both sides.  No drainage.  Neurological:     Mental Status: She is alert.      UC Treatments / Results  Labs (all labs ordered are listed, but only abnormal results are displayed) Labs Reviewed - No data to display  EKG   Radiology No results found.  Procedures Procedures (including critical care time)  Medications Ordered in UC Medications - No data to display  Initial Impression / Assessment  and Plan / UC Course  I have reviewed the triage vital signs and the nursing notes.  Pertinent labs & imaging results that were available during my care of the patient were reviewed by me and considered in my medical decision making (see chart for details).    Candidal dermatitis.  Afebrile and vital signs are stable.  Treating with nystatin cream.  Prescription for mupirocin ointment also sent to pharmacy for use on the areas of skin breakdown.  Instructed patient and her daughter that she needs to follow-up with her PCP in the next 1 to 2 days.  ED precautions given.  Education provided on skin yeast rash.  They agree to plan of care.  Final Clinical Impressions(s) / UC Diagnoses   Final diagnoses:  Candidal dermatitis     Discharge Instructions      Use the nystatin cream as directed for your skin yeast rash.  Use the mupirocin ointment on the open sores.    Follow up with your primary care provider tomorrow.  Go to the emergency department if you have worsening symptoms.        ED Prescriptions     Medication Sig Dispense Auth. Provider   nystatin cream (MYCOSTATIN) Apply to affected area 2 times daily 30 g Corlis Burnard DEL, NP   mupirocin ointment (BACTROBAN) 2 % Apply 1 Application topically 2 (two) times daily. 22 g Corlis Burnard DEL, NP      PDMP not reviewed this encounter.   Corlis Burnard DEL, NP 02/20/24 [phone]

## 2024-02-27 ENCOUNTER — Ambulatory Visit: Admitting: Urology

## 2024-02-27 VITALS — BP 101/68 | HR 79 | Ht 63.0 in | Wt 180.0 lb

## 2024-02-27 DIAGNOSIS — N390 Urinary tract infection, site not specified: Secondary | ICD-10-CM

## 2024-02-27 LAB — MICROSCOPIC EXAMINATION
Epithelial Cells (non renal): >10 /HPF — AB (ref 0–10)
WBC, UA: >30 /HPF — AB (ref 0–5)

## 2024-02-27 LAB — URINALYSIS, COMPLETE
Bilirubin, UA: NEGATIVE
Glucose, UA: NEGATIVE
Ketones, UA: NEGATIVE
Nitrite, UA: POSITIVE — AB
Specific Gravity, UA: 1.02 (ref 1.005–1.030)
Urobilinogen, Ur: 0.2 mg/dL (ref 0.2–1.0)
pH, UA: 6 (ref 5.0–7.5)

## 2024-02-27 MED ORDER — TRIMETHOPRIM 100 MG PO TABS: 100.0000 mg | ORAL_TABLET | Freq: Every day | ORAL | 11 refills | Status: DC

## 2024-02-27 NOTE — Progress Notes (Signed)
 02/27/2024 1:12 PM   [name] [birth year] [id number]  Referring provider: Cleotilde Oneil FALCON, MD [phone] [facility] [address] [facility] [place],  [place] [address]  Chief Complaint  Patient presents with   Establish Care   New Patient (Initial Visit)   Recurrent UTI    HPI: I was consulted to assess the patient's urinary tract infections and urinary incontinence.  She is not a strong historian but appears that she will get worsening incontinence and frequency and sometimes pain that responds temporarily to antibiotics.  She thinks she has had more than 5 infections in the last year and within 1 or 2 weeks of stopping antibiotic her symptoms returned  Initially she said she did not leak a lot but it turns out she wears 12-15 pads a day that can be soaked.  She can change depends hourly.  She reports urge incontinence stress incontinence and bedwetting of varying severity.  She voids every 2 hours and gets up once or twice at night.  She uses a walker and has had a mild stroke.  She has had low back surgery  She is on oral hypoglycemics.  No bladder surgery   PMH: Past Medical History:  Diagnosis Date   A-fib (HCC)    back in 2013, for stent placement in kidney, she had a bout of a-fib.  Now, back in rhythm   Arthritis    Chronic kidney disease    Complication of anesthesia    Diabetes mellitus without complication (HCC)    type 2    only takes metformin    Dyspnea    Dysuria    History of kidney stones    Hypertension    Osteopenia    PONV (postoperative nausea and vomiting)    Sleep apnea    lost over 120 lbs, and no long uses it (close to 2 yrs now)   Stroke [facility])    mini 2003--affected right side of body.   took 6 weeks to get over.   Urine, incontinence, stress female     Surgical History: Past Surgical History:  Procedure Laterality Date   BREAST BIOPSY Right 11/22/2019   U/S Bx-HYDROMARK-BENIGN INTRAMAMMARY LYMPH NODE.   COLONOSCOPY  W/ POLYPECTOMY Right    COLONOSCOPY WITH PROPOFOL  N/A 01/09/2015   Procedure: COLONOSCOPY WITH PROPOFOL ;  Surgeon: Lamar ONEIDA Holmes, MD;  Location: [facility] ENDOSCOPY;  Service: Endoscopy;  Laterality: N/A;   ERCP N/A 03/15/2019   Procedure: ENDOSCOPIC RETROGRADE CHOLANGIOPANCREATOGRAPHY (ERCP);  Surgeon: Jinny Carmine, MD;  Location: [facility] ENDOSCOPY;  Service: Endoscopy;  Laterality: N/A;   EYE SURGERY  2013   bilateral cataracts with implants   JOINT REPLACEMENT     left knee  TKR   KIDNEY STONE SURGERY     40 yrs ago, while pregnant, she had to 'be cut in 1/2 to get stone out'   KNEE ARTHROPLASTY Right 11/06/2021   Procedure: COMPUTER ASSISTED TOTAL KNEE ARTHROPLASTY - DEPUY;  Surgeon: Mardee Lynwood SQUIBB, MD;  Location: [facility] ORS;  Service: Orthopedics;  Laterality: Right;   KNEE ARTHROSCOPY     left knee   LITHOTRIPSY     ltk Left    LUMBAR LAMINECTOMY/DECOMPRESSION MICRODISCECTOMY N/A 09/06/2016   Procedure: LAMINECTOMY AND FORAMINOTOMY LUMBAR THREE- LUMBAR FOUR, LUMBAR FOUR- LUMBAR FIVE;  Surgeon: Reyes Budge, MD;  Location: [facility] OR;  Service: Neurosurgery;  Laterality: N/A;  LAMINECTOMY AND FORAMINOTOMY LUMBAR 3- LUMBAR 4, LUMBAR 4- LUMBAR 5    REVERSE SHOULDER ARTHROPLASTY Right 12/08/2020  Procedure: Right reverse total shoulder arthroplasty and  biceps tenodesis;  Surgeon: Tobie Priest, MD;  Location: [facility] ORS;  Service: Orthopedics;  Laterality: Right;   throidectomy  1979   goiter   TRIGGER FINGER RELEASE Left     Home Medications:  Allergies as of 02/27/2024       Reactions   Prednisone Other (See Comments)   Lactose Intolerance (gi)    Milk    Morphine  And Codeine Nausea And Vomiting   Vicodin [hydrocodone-acetaminophen ] Nausea And Vomiting   Tolerates acetaminophen         Medication List        Accurate as of February 27, 2024  1:12 PM. If you have any questions, ask your nurse or doctor.          allopurinol  100 MG tablet Commonly known as: ZYLOPRIM  Take 100  mg by mouth daily.   ALPRAZolam  0.5 MG tablet Commonly known as: XANAX  Take 0.5 mg by mouth 2 (two) times daily as needed for anxiety.   amLODipine  10 MG tablet Commonly known as: NORVASC  Take 10 mg by mouth daily.   aspirin  EC 81 MG tablet Take 81 mg by mouth daily. Swallow whole.   Chlorpheniramine-DM 4-30 MG Tabs Take by mouth.   furosemide  20 MG tablet Commonly known as: LASIX  Take 20 mg by mouth daily as needed for edema.   gabapentin  100 MG capsule Commonly known as: NEURONTIN  Take 100 mg by mouth 2 (two) times daily.   glipiZIDE  5 MG tablet Commonly known as: GLUCOTROL  Take 5 mg by mouth daily before breakfast.   loperamide  2 MG capsule Commonly known as: IMODIUM  Take by mouth as needed for diarrhea or loose stools.   Melatonin 5 MG Caps Take 1 tablet by mouth at bedtime as needed for sleep.   metFORMIN  500 MG tablet Commonly known as: GLUCOPHAGE  Take 1 tablet by mouth 3 (three) times daily.   metoprolol  tartrate 100 MG tablet Commonly known as: LOPRESSOR  Take 100 mg by mouth daily.   mupirocin ointment 2 % Commonly known as: BACTROBAN Apply 1 Application topically 2 (two) times daily.   nystatin cream Commonly known as: MYCOSTATIN Apply to affected area 2 times daily   ondansetron  4 MG disintegrating tablet Commonly known as: ZOFRAN -ODT Take 1 tablet (4 mg total) by mouth every 8 (eight) hours as needed for nausea or vomiting.   OVER THE COUNTER MEDICATION doTerra for healthy immnune system   oxyCODONE  5 MG immediate release tablet Commonly known as: Oxy IR/ROXICODONE  Take 1 tablet (5 mg total) by mouth every 4 (four) hours as needed for moderate pain (pain score 4-6).   OZEMPIC (0.25 OR 0.5 MG/DOSE) [place] Inject 0.5 mg into the skin every 7 (seven) days.   pantoprazole  40 MG tablet Commonly known as: PROTONIX  Take 1 tablet (40 mg total) by mouth daily.   rosuvastatin  10 MG tablet Commonly known as: CRESTOR  Take 10 mg by mouth daily.    TYLENOL  500 MG tablet Generic drug: acetaminophen  Take 500 mg by mouth every 6 (six) hours as needed.   vitamin B-12 500 MCG tablet Commonly known as: CYANOCOBALAMIN  Take 500 mcg by mouth daily.   Vitamin D3 50 MCG (2000 UT) Tabs Take 2,000 Units by mouth daily.        Allergies:  Allergies  Allergen Reactions   Prednisone Other (See Comments)   Lactose Intolerance (Gi)     Milk    Morphine  And Codeine Nausea And Vomiting   Vicodin [Hydrocodone-Acetaminophen ] Nausea And  Vomiting    Tolerates acetaminophen     Family History: Family History  Problem Relation Age of Onset   Breast cancer Neg Hx     Social History:  reports that she has never smoked. She has never used smokeless tobacco. She reports current drug use. She reports that she does not drink alcohol.  ROS:                                        Physical Exam: BP 101/68 (BP Location: Left Arm, Patient Position: Sitting, Cuff Size: Normal)   Pulse 79   Ht 5' 3 (1.6 m)   Wt 81.6 kg   SpO2 95%   BMI 31.89 kg/m   Constitutional:  Alert and oriented, No acute distress. HEENT: [place] AT, moist mucus membranes.  Trachea midline, no masses.  Laboratory Data: Lab Results  Component Value Date   WBC 9.1 01/09/2024   HGB 16.9 (H) 01/09/2024   HCT 50.0 (H) 01/09/2024   MCV 91.1 01/09/2024   PLT 261 01/09/2024    Lab Results  Component Value Date   CREATININE 1.20 (H) 01/09/2024    No results found for: PSA  No results found for: TESTOSTERONE  Lab Results  Component Value Date   HGBA1C 6.7 (H) 10/23/2021    Urinalysis    Component Value Date/Time   COLORURINE YELLOW (A) 01/09/2024 0947   APPEARANCEUR CLEAR (A) 01/09/2024 0947   APPEARANCEUR Cloudy 07/28/2013 1213   LABSPEC 1.010 01/09/2024 0947   LABSPEC 1.016 07/28/2013 1213   PHURINE 6.0 01/09/2024 0947   GLUCOSEU NEGATIVE 01/09/2024 0947   GLUCOSEU Negative 07/28/2013 1213   HGBUR NEGATIVE 01/09/2024 0947    BILIRUBINUR NEGATIVE 01/09/2024 0947   BILIRUBINUR 2+ 07/28/2013 1213   KETONESUR NEGATIVE 01/09/2024 0947   PROTEINUR 100 (A) 01/09/2024 0947   NITRITE NEGATIVE 01/09/2024 0947   LEUKOCYTESUR NEGATIVE 01/09/2024 0947   LEUKOCYTESUR 3+ 07/28/2013 1213    Pertinent Imaging: Urine reviewed.  Chart reviewed.  Urine sent for culture.  I did see positive cultures in the medical record  Assessment & Plan: Patient has recurrent bladder infections.  Pathophysiology discussed.  She had a CT scan June 2024.  She had a little bit of air in her bladder.  She had bilateral kidney stones not obstructing.  She had a 1 cm stone in the right renal pelvis.  I thought it was best to get a renal ultrasound and have her come back for pelvic examination and cystoscopy.  I will start the patient on trimethoprim  100 mg 30 x 11 and call if culture positive.  We will then see if her incontinence regulates and what her treatment goals are pending.  Patient may have been on prophylaxis in the past based upon today's history  1. Recurrent UTI (Primary)  - Urinalysis, Complete   No follow-ups on file.  Kelsey DELENA Elizabeth, MD  [facility] [address] [place], [place] [address] [phone]

## 2024-03-02 LAB — CULTURE, URINE COMPREHENSIVE

## 2024-03-05 ENCOUNTER — Ambulatory Visit: Payer: Self-pay

## 2024-03-05 DIAGNOSIS — N390 Urinary tract infection, site not specified: Secondary | ICD-10-CM

## 2024-03-05 MED ORDER — CEPHALEXIN 500 MG PO CAPS: 500.0000 mg | ORAL_CAPSULE | Freq: Three times a day (TID) | ORAL | 0 refills | Status: AC

## 2024-03-29 ENCOUNTER — Inpatient Hospital Stay (HOSPITAL_COMMUNITY)
Admission: EM | Admit: 2024-03-29 | Discharge: 2024-04-03 | DRG: 023 | Disposition: A | Discharge status: Hospice | Attending: Neurology | Admitting: Neurology

## 2024-03-29 ENCOUNTER — Encounter (HOSPITAL_COMMUNITY)
Admission: EM | Disposition: A | Discharge status: Hospice | Payer: Self-pay | Source: Home / Self Care | Attending: Neurology

## 2024-03-29 ENCOUNTER — Emergency Department (HOSPITAL_COMMUNITY)

## 2024-03-29 ENCOUNTER — Emergency Department (HOSPITAL_COMMUNITY): Admitting: Certified Registered Nurse Anesthetist

## 2024-03-29 ENCOUNTER — Inpatient Hospital Stay (HOSPITAL_COMMUNITY)

## 2024-03-29 ENCOUNTER — Inpatient Hospital Stay (HOSPITAL_COMMUNITY): Admitting: Anesthesiology

## 2024-03-29 ENCOUNTER — Encounter (HOSPITAL_COMMUNITY): Admission: EM | Disposition: A | Payer: Self-pay | Source: Home / Self Care | Attending: Neurology

## 2024-03-29 DIAGNOSIS — R29723 NIHSS score 23: Secondary | ICD-10-CM | POA: Diagnosis not present

## 2024-03-29 DIAGNOSIS — R29717 NIHSS score 17: Secondary | ICD-10-CM | POA: Diagnosis not present

## 2024-03-29 DIAGNOSIS — I70221 Atherosclerosis of native arteries of extremities with rest pain, right leg: Secondary | ICD-10-CM

## 2024-03-29 DIAGNOSIS — N179 Acute kidney failure, unspecified: Secondary | ICD-10-CM | POA: Diagnosis not present

## 2024-03-29 DIAGNOSIS — F419 Anxiety disorder, unspecified: Secondary | ICD-10-CM | POA: Diagnosis present

## 2024-03-29 DIAGNOSIS — Z4682 Encounter for fitting and adjustment of non-vascular catheter: Secondary | ICD-10-CM | POA: Diagnosis not present

## 2024-03-29 DIAGNOSIS — I639 Cerebral infarction, unspecified: Secondary | ICD-10-CM | POA: Diagnosis not present

## 2024-03-29 DIAGNOSIS — I63312 Cerebral infarction due to thrombosis of left middle cerebral artery: Secondary | ICD-10-CM

## 2024-03-29 DIAGNOSIS — E119 Type 2 diabetes mellitus without complications: Secondary | ICD-10-CM

## 2024-03-29 DIAGNOSIS — I129 Hypertensive chronic kidney disease with stage 1 through stage 4 chronic kidney disease, or unspecified chronic kidney disease: Secondary | ICD-10-CM | POA: Diagnosis not present

## 2024-03-29 DIAGNOSIS — E1122 Type 2 diabetes mellitus with diabetic chronic kidney disease: Secondary | ICD-10-CM | POA: Diagnosis not present

## 2024-03-29 DIAGNOSIS — I6602 Occlusion and stenosis of left middle cerebral artery: Secondary | ICD-10-CM

## 2024-03-29 DIAGNOSIS — I6523 Occlusion and stenosis of bilateral carotid arteries: Secondary | ICD-10-CM | POA: Diagnosis not present

## 2024-03-29 DIAGNOSIS — G9340 Encephalopathy, unspecified: Secondary | ICD-10-CM

## 2024-03-29 DIAGNOSIS — R4701 Aphasia: Secondary | ICD-10-CM

## 2024-03-29 DIAGNOSIS — I4891 Unspecified atrial fibrillation: Secondary | ICD-10-CM

## 2024-03-29 DIAGNOSIS — Z452 Encounter for adjustment and management of vascular access device: Secondary | ICD-10-CM | POA: Diagnosis not present

## 2024-03-29 DIAGNOSIS — I998 Other disorder of circulatory system: Secondary | ICD-10-CM

## 2024-03-29 DIAGNOSIS — G8190 Hemiplegia, unspecified affecting unspecified side: Secondary | ICD-10-CM | POA: Diagnosis not present

## 2024-03-29 DIAGNOSIS — R200 Anesthesia of skin: Secondary | ICD-10-CM

## 2024-03-29 DIAGNOSIS — R531 Weakness: Secondary | ICD-10-CM

## 2024-03-29 DIAGNOSIS — N184 Chronic kidney disease, stage 4 (severe): Secondary | ICD-10-CM | POA: Diagnosis present

## 2024-03-29 DIAGNOSIS — I6350 Cerebral infarction due to unspecified occlusion or stenosis of unspecified cerebral artery: Secondary | ICD-10-CM | POA: Diagnosis not present

## 2024-03-29 DIAGNOSIS — N1832 Chronic kidney disease, stage 3b: Secondary | ICD-10-CM | POA: Diagnosis not present

## 2024-03-29 DIAGNOSIS — J9601 Acute respiratory failure with hypoxia: Secondary | ICD-10-CM | POA: Diagnosis not present

## 2024-03-29 DIAGNOSIS — G936 Cerebral edema: Secondary | ICD-10-CM | POA: Diagnosis not present

## 2024-03-29 DIAGNOSIS — I720 Aneurysm of carotid artery: Secondary | ICD-10-CM | POA: Diagnosis not present

## 2024-03-29 DIAGNOSIS — Z515 Encounter for palliative care: Secondary | ICD-10-CM | POA: Diagnosis not present

## 2024-03-29 DIAGNOSIS — I743 Embolism and thrombosis of arteries of the lower extremities: Secondary | ICD-10-CM

## 2024-03-29 DIAGNOSIS — I63512 Cerebral infarction due to unspecified occlusion or stenosis of left middle cerebral artery: Principal | ICD-10-CM

## 2024-03-29 DIAGNOSIS — I70201 Unspecified atherosclerosis of native arteries of extremities, right leg: Secondary | ICD-10-CM | POA: Diagnosis not present

## 2024-03-29 DIAGNOSIS — I6389 Other cerebral infarction: Secondary | ICD-10-CM | POA: Diagnosis not present

## 2024-03-29 DIAGNOSIS — I97638 Postprocedural hematoma of a circulatory system organ or structure following other circulatory system procedure: Secondary | ICD-10-CM

## 2024-03-29 DIAGNOSIS — R29818 Other symptoms and signs involving the nervous system: Secondary | ICD-10-CM | POA: Diagnosis not present

## 2024-03-29 DIAGNOSIS — D72829 Elevated white blood cell count, unspecified: Secondary | ICD-10-CM | POA: Insufficient documentation

## 2024-03-29 DIAGNOSIS — G4733 Obstructive sleep apnea (adult) (pediatric): Secondary | ICD-10-CM

## 2024-03-29 DIAGNOSIS — I97621 Postprocedural hematoma of a circulatory system organ or structure following other procedure: Secondary | ICD-10-CM | POA: Diagnosis not present

## 2024-03-29 DIAGNOSIS — Z66 Do not resuscitate: Secondary | ICD-10-CM | POA: Diagnosis not present

## 2024-03-29 DIAGNOSIS — E785 Hyperlipidemia, unspecified: Secondary | ICD-10-CM | POA: Diagnosis not present

## 2024-03-29 DIAGNOSIS — E1159 Type 2 diabetes mellitus with other circulatory complications: Secondary | ICD-10-CM | POA: Diagnosis not present

## 2024-03-29 DIAGNOSIS — I10 Essential (primary) hypertension: Secondary | ICD-10-CM | POA: Diagnosis not present

## 2024-03-29 DIAGNOSIS — E782 Mixed hyperlipidemia: Secondary | ICD-10-CM | POA: Diagnosis present

## 2024-03-29 DIAGNOSIS — Z96611 Presence of right artificial shoulder joint: Secondary | ICD-10-CM | POA: Diagnosis not present

## 2024-03-29 DIAGNOSIS — I6782 Cerebral ischemia: Secondary | ICD-10-CM | POA: Diagnosis not present

## 2024-03-29 DIAGNOSIS — E1151 Type 2 diabetes mellitus with diabetic peripheral angiopathy without gangrene: Secondary | ICD-10-CM | POA: Diagnosis present

## 2024-03-29 DIAGNOSIS — I482 Chronic atrial fibrillation, unspecified: Secondary | ICD-10-CM | POA: Diagnosis present

## 2024-03-29 DIAGNOSIS — D62 Acute posthemorrhagic anemia: Secondary | ICD-10-CM

## 2024-03-29 DIAGNOSIS — Z7189 Other specified counseling: Secondary | ICD-10-CM | POA: Diagnosis not present

## 2024-03-29 DIAGNOSIS — R000 Tachycardia, unspecified: Secondary | ICD-10-CM | POA: Diagnosis not present

## 2024-03-29 DIAGNOSIS — R29725 NIHSS score 25: Secondary | ICD-10-CM | POA: Diagnosis not present

## 2024-03-29 DIAGNOSIS — I6501 Occlusion and stenosis of right vertebral artery: Secondary | ICD-10-CM | POA: Diagnosis not present

## 2024-03-29 DIAGNOSIS — I63412 Cerebral infarction due to embolism of left middle cerebral artery: Secondary | ICD-10-CM | POA: Diagnosis not present

## 2024-03-29 DIAGNOSIS — I69391 Dysphagia following cerebral infarction: Secondary | ICD-10-CM | POA: Diagnosis not present

## 2024-03-29 DIAGNOSIS — N189 Chronic kidney disease, unspecified: Secondary | ICD-10-CM

## 2024-03-29 DIAGNOSIS — Z711 Person with feared health complaint in whom no diagnosis is made: Secondary | ICD-10-CM | POA: Diagnosis not present

## 2024-03-29 HISTORY — PX: APPLICATION OF WOUND VAC: SHX5189

## 2024-03-29 HISTORY — PX: RADIOLOGY WITH ANESTHESIA: SHX6223

## 2024-03-29 HISTORY — PX: THROMBECTOMY OF BYPASS GRAFT FEMORAL- TIBIAL ARTERY: SHX6904

## 2024-03-29 HISTORY — PX: THROMBECTOMY FEMORAL ARTERY: SHX6406

## 2024-03-29 LAB — DIFFERENTIAL
Abs Immature Granulocytes: 0.03 K/uL (ref 0.00–0.07)
Basophils Absolute: 0.1 K/uL (ref 0.0–0.1)
Basophils Relative: 1 %
Eosinophils Absolute: 0.4 K/uL (ref 0.0–0.5)
Eosinophils Relative: 4 %
Immature Granulocytes: 0 %
Lymphocytes Relative: 25 %
Lymphs Abs: 2.2 K/uL (ref 0.7–4.0)
Monocytes Absolute: 0.7 K/uL (ref 0.1–1.0)
Monocytes Relative: 7 %
Neutro Abs: 5.6 K/uL (ref 1.7–7.7)
Neutrophils Relative %: 63 %

## 2024-03-29 LAB — COMPREHENSIVE METABOLIC PANEL WITH GFR
ALT: 34 U/L (ref 0–44)
AST: 30 U/L (ref 15–41)
Albumin: 3 g/dL — ABNORMAL LOW (ref 3.5–5.0)
Alkaline Phosphatase: 60 U/L (ref 38–126)
Anion gap: 10 (ref 5–15)
BUN: 23 mg/dL (ref 8–23)
CO2: 20 mmol/L — ABNORMAL LOW (ref 22–32)
Calcium: 8.9 mg/dL (ref 8.9–10.3)
Chloride: 105 mmol/L (ref 98–111)
Creatinine, Ser: 1.96 mg/dL — ABNORMAL HIGH (ref 0.44–1.00)
GFR, Estimated: 26 mL/min — ABNORMAL LOW (ref >60)
Glucose, Bld: 159 mg/dL — ABNORMAL HIGH (ref 70–99)
Potassium: 4.7 mmol/L (ref 3.5–5.1)
Sodium: 135 mmol/L (ref 135–145)
Total Bilirubin: 0.8 mg/dL (ref 0.0–1.2)
Total Protein: 6.3 g/dL — ABNORMAL LOW (ref 6.5–8.1)

## 2024-03-29 LAB — PREPARE RBC (CROSSMATCH)

## 2024-03-29 LAB — POCT I-STAT 7, (LYTES, BLD GAS, ICA,H+H)
Acid-base deficit: 9 mmol/L — ABNORMAL HIGH (ref 0.0–2.0)
Bicarbonate: 16.5 mmol/L — ABNORMAL LOW (ref 20.0–28.0)
Calcium, Ion: 1.12 mmol/L — ABNORMAL LOW (ref 1.15–1.40)
HCT: 29 % — ABNORMAL LOW (ref 36.0–46.0)
Hemoglobin: 9.9 g/dL — ABNORMAL LOW (ref 12.0–15.0)
O2 Saturation: 99 %
Patient temperature: 36.5
Potassium: 4.5 mmol/L (ref 3.5–5.1)
Sodium: 138 mmol/L (ref 135–145)
TCO2: 17 mmol/L — ABNORMAL LOW (ref 22–32)
pCO2 arterial: 31.5 mmHg — ABNORMAL LOW (ref 32–48)
pH, Arterial: 7.326 — ABNORMAL LOW (ref 7.35–7.45)
pO2, Arterial: 122 mmHg — ABNORMAL HIGH (ref 83–108)

## 2024-03-29 LAB — I-STAT CHEM 8, ED
BUN: 28 mg/dL — ABNORMAL HIGH (ref 8–23)
Calcium, Ion: 1.21 mmol/L (ref 1.15–1.40)
Chloride: 103 mmol/L (ref 98–111)
Creatinine, Ser: 2.1 mg/dL — ABNORMAL HIGH (ref 0.44–1.00)
Glucose, Bld: 156 mg/dL — ABNORMAL HIGH (ref 70–99)
HCT: 50 % — ABNORMAL HIGH (ref 36.0–46.0)
Hemoglobin: 17 g/dL — ABNORMAL HIGH (ref 12.0–15.0)
Potassium: 4.6 mmol/L (ref 3.5–5.1)
Sodium: 137 mmol/L (ref 135–145)
TCO2: 23 mmol/L (ref 22–32)

## 2024-03-29 LAB — CBC
HCT: 47.2 % — ABNORMAL HIGH (ref 36.0–46.0)
Hemoglobin: 15.9 g/dL — ABNORMAL HIGH (ref 12.0–15.0)
MCH: 31.7 pg (ref 26.0–34.0)
MCHC: 33.7 g/dL (ref 30.0–36.0)
MCV: 94.2 fL (ref 80.0–100.0)
Platelets: 228 K/uL (ref 150–400)
RBC: 5.01 MIL/uL (ref 3.87–5.11)
RDW: 14.1 % (ref 11.5–15.5)
WBC: 8.9 K/uL (ref 4.0–10.5)
nRBC: 0 % (ref 0.0–0.2)

## 2024-03-29 LAB — GLUCOSE, CAPILLARY
Glucose-Capillary: 219 mg/dL — ABNORMAL HIGH (ref 70–99)
Glucose-Capillary: 297 mg/dL — ABNORMAL HIGH (ref 70–99)

## 2024-03-29 LAB — CBG MONITORING, ED
Glucose-Capillary: 171 mg/dL — ABNORMAL HIGH (ref 70–99)
Glucose-Capillary: 184 mg/dL — ABNORMAL HIGH (ref 70–99)

## 2024-03-29 LAB — ETHANOL: Alcohol, Ethyl (B): <15 mg/dL (ref <15)

## 2024-03-29 SURGERY — THROMBECTOMY, ARTERY, FEMORAL
Anesthesia: General | Laterality: Right

## 2024-03-29 SURGERY — RADIOLOGY WITH ANESTHESIA
Anesthesia: General

## 2024-03-29 MED ORDER — FENTANYL CITRATE (PF) 250 MCG/5ML IJ SOLN
INTRAMUSCULAR | Status: AC
  Filled 2024-03-29: qty 5

## 2024-03-29 MED ORDER — VASOPRESSIN 20 UNIT/ML IV SOLN
INTRAVENOUS | Status: DC | PRN
  Administered 2024-03-29: 1 [IU] via INTRAVENOUS

## 2024-03-29 MED ORDER — FENTANYL CITRATE (PF) 100 MCG/2ML IJ SOLN
INTRAMUSCULAR | Status: AC
  Filled 2024-03-29: qty 2

## 2024-03-29 MED ORDER — HEPARIN 6000 UNIT IRRIGATION SOLUTION
Status: DC | PRN
  Administered 2024-03-29: 1

## 2024-03-29 MED ORDER — SODIUM CHLORIDE 0.9% FLUSH: 3.0000 mL | Freq: Once | INTRAVENOUS | Status: DC

## 2024-03-29 MED ORDER — CLEVIDIPINE BUTYRATE 0.5 MG/ML IV EMUL
0.0000 mg/h | INTRAVENOUS | Status: DC
  Administered 2024-03-30: 2 mg/h via INTRAVENOUS
  Filled 2024-03-29: qty 50

## 2024-03-29 MED ORDER — PHENYLEPHRINE 80 MCG/ML (10ML) SYRINGE FOR IV PUSH (FOR BLOOD PRESSURE SUPPORT)
PREFILLED_SYRINGE | INTRAVENOUS | Status: DC | PRN
  Administered 2024-03-29: 160 ug via INTRAVENOUS

## 2024-03-29 MED ORDER — ACETAMINOPHEN 325 MG PO TABS: 650.0000 mg | ORAL_TABLET | ORAL | Status: DC | PRN

## 2024-03-29 MED ORDER — HEPARIN 6000 UNIT IRRIGATION SOLUTION
Status: AC
  Filled 2024-03-29: qty 500

## 2024-03-29 MED ORDER — PROPOFOL 10 MG/ML IV BOLUS
INTRAVENOUS | Status: DC | PRN
  Administered 2024-03-29: 110 mg via INTRAVENOUS

## 2024-03-29 MED ORDER — PROPOFOL 10 MG/ML IV BOLUS
INTRAVENOUS | Status: AC
  Filled 2024-03-29: qty 20

## 2024-03-29 MED ORDER — LIDOCAINE HCL (CARDIAC) PF 100 MG/5ML IV SOSY
PREFILLED_SYRINGE | INTRAVENOUS | Status: DC | PRN
  Administered 2024-03-29: 100 mg via INTRAVENOUS

## 2024-03-29 MED ORDER — LACTATED RINGERS IV BOLUS
1000.0000 mL | Freq: Once | INTRAVENOUS | Status: AC
  Administered 2024-03-29: 1000 mL via INTRAVENOUS

## 2024-03-29 MED ORDER — CHLORHEXIDINE GLUCONATE CLOTH 2 % EX PADS
6.0000 | MEDICATED_PAD | Freq: Every day | CUTANEOUS | Status: DC
  Administered 2024-03-30 – 2024-04-02 (×4): 6 via TOPICAL

## 2024-03-29 MED ORDER — ACETAMINOPHEN 650 MG RE SUPP: 650.0000 mg | RECTAL | Status: DC | PRN

## 2024-03-29 MED ORDER — CLEVIDIPINE BUTYRATE 0.5 MG/ML IV EMUL: 0.0000 mg/h | INTRAVENOUS | Status: DC

## 2024-03-29 MED ORDER — 0.9 % SODIUM CHLORIDE (POUR BTL) OPTIME
TOPICAL | Status: DC | PRN
  Administered 2024-03-29: 2000 mL

## 2024-03-29 MED ORDER — DEXAMETHASONE SODIUM PHOSPHATE 4 MG/ML IJ SOLN
INTRAMUSCULAR | Status: DC | PRN
  Administered 2024-03-29: 10 mg via INTRAVENOUS

## 2024-03-29 MED ORDER — SUGAMMADEX SODIUM 200 MG/2ML IV SOLN
INTRAVENOUS | Status: DC | PRN
  Administered 2024-03-29: 166.4 mg via INTRAVENOUS

## 2024-03-29 MED ORDER — ACETAMINOPHEN 160 MG/5ML PO SOLN
650.0000 mg | ORAL | Status: DC | PRN
  Administered 2024-04-01 – 2024-04-02 (×4): 650 mg
  Filled 2024-03-29 (×4): qty 20.3

## 2024-03-29 MED ORDER — NOREPINEPHRINE 4 MG/250ML-% IV SOLN
INTRAVENOUS | Status: AC
Start: 2024-03-29 — End: 2024-03-29
  Filled 2024-03-29: qty 250

## 2024-03-29 MED ORDER — PHENYLEPHRINE HCL-NACL 20-0.9 MG/250ML-% IV SOLN
INTRAVENOUS | Status: DC | PRN
  Administered 2024-03-29: 100 ug/min via INTRAVENOUS

## 2024-03-29 MED ORDER — ONDANSETRON HCL 4 MG/2ML IJ SOLN
INTRAMUSCULAR | Status: DC | PRN
  Administered 2024-03-29: 4 mg via INTRAVENOUS

## 2024-03-29 MED ORDER — SODIUM CHLORIDE 0.9 % IV SOLN: INTRAVENOUS | Status: DC | PRN

## 2024-03-29 MED ORDER — LABETALOL HCL 5 MG/ML IV SOLN
INTRAVENOUS | Status: AC
  Administered 2024-03-29: 20 mg
  Filled 2024-03-29: qty 4

## 2024-03-29 MED ORDER — NOREPINEPHRINE 4 MG/250ML-% IV SOLN
INTRAVENOUS | Status: DC | PRN
  Administered 2024-03-29: 4 ug/min via INTRAVENOUS

## 2024-03-29 MED ORDER — ALBUMIN HUMAN 5 % IV SOLN: INTRAVENOUS | Status: DC | PRN

## 2024-03-29 MED ORDER — ESMOLOL HCL 100 MG/10ML IV SOLN
INTRAVENOUS | Status: DC | PRN
  Administered 2024-03-29: 20 mg via INTRAVENOUS
  Administered 2024-03-29: 30 mg via INTRAVENOUS

## 2024-03-29 MED ORDER — ROCURONIUM BROMIDE 100 MG/10ML IV SOLN
INTRAVENOUS | Status: DC | PRN
  Administered 2024-03-29: 60 mg via INTRAVENOUS

## 2024-03-29 MED ORDER — PHENYLEPHRINE HCL (PRESSORS) 10 MG/ML IV SOLN
INTRAVENOUS | Status: DC | PRN
  Administered 2024-03-29: 240 ug via INTRAVENOUS
  Administered 2024-03-29: 80 ug via INTRAVENOUS
  Administered 2024-03-29: 240 ug via INTRAVENOUS

## 2024-03-29 MED ORDER — SODIUM CHLORIDE 0.9 % IV SOLN: 10.0000 mL/h | Freq: Once | INTRAVENOUS | Status: DC

## 2024-03-29 MED ORDER — IOHEXOL 300 MG/ML  SOLN
100.0000 mL | Freq: Once | INTRAMUSCULAR | Status: AC | PRN
  Administered 2024-03-29: 75 mL via INTRA_ARTERIAL

## 2024-03-29 MED ORDER — VASOPRESSIN 20 UNIT/ML IV SOLN
INTRAVENOUS | Status: DC | PRN
  Administered 2024-03-29: 3 [IU] via INTRAVENOUS
  Administered 2024-03-29: 1 [IU] via INTRAVENOUS

## 2024-03-29 MED ORDER — ALBUMIN HUMAN 5 % IV SOLN
12.5000 g | Freq: Once | INTRAVENOUS | Status: AC
  Administered 2024-03-29: 12.5 g via INTRAVENOUS

## 2024-03-29 MED ORDER — LABETALOL HCL 5 MG/ML IV SOLN
INTRAVENOUS | Status: DC | PRN
  Administered 2024-03-29: 10 mg via INTRAVENOUS

## 2024-03-29 MED ORDER — SODIUM CHLORIDE 0.9 % IV SOLN: INTRAVENOUS | Status: DC

## 2024-03-29 MED ORDER — FENTANYL CITRATE (PF) 250 MCG/5ML IJ SOLN
INTRAMUSCULAR | Status: DC | PRN
  Administered 2024-03-29 (×2): 25 ug via INTRAVENOUS

## 2024-03-29 MED ORDER — MAGNESIUM SULFATE 2 GM/50ML IV SOLN
2.0000 g | Freq: Once | INTRAVENOUS | Status: AC
  Administered 2024-03-30: 2 g via INTRAVENOUS
  Filled 2024-03-29: qty 50

## 2024-03-29 MED ORDER — ALBUMIN HUMAN 5 % IV SOLN
INTRAVENOUS | Status: AC
  Filled 2024-03-29: qty 250

## 2024-03-29 MED ORDER — SODIUM CHLORIDE 0.9 % IV BOLUS: 250.0000 mL | INTRAVENOUS | Status: DC | PRN

## 2024-03-29 MED ORDER — OXYCODONE HCL 5 MG PO TABS: 5.0000 mg | ORAL_TABLET | Freq: Once | ORAL | Status: DC | PRN

## 2024-03-29 MED ORDER — FENTANYL CITRATE (PF) 100 MCG/2ML IJ SOLN: 25.0000 ug | INTRAMUSCULAR | Status: DC | PRN

## 2024-03-29 MED ORDER — OXYCODONE HCL 5 MG/5ML PO SOLN: 5.0000 mg | Freq: Once | ORAL | Status: DC | PRN

## 2024-03-29 MED ORDER — ROCURONIUM BROMIDE 10 MG/ML (PF) SYRINGE
PREFILLED_SYRINGE | INTRAVENOUS | Status: DC | PRN
  Administered 2024-03-29: 100 mg via INTRAVENOUS

## 2024-03-29 MED ORDER — HEPARIN SODIUM (PORCINE) 1000 UNIT/ML IJ SOLN
INTRAMUSCULAR | Status: DC | PRN
  Administered 2024-03-29: 8000 [IU] via INTRAVENOUS

## 2024-03-29 MED ORDER — SUCCINYLCHOLINE CHLORIDE 200 MG/10ML IV SOSY
PREFILLED_SYRINGE | INTRAVENOUS | Status: DC | PRN
  Administered 2024-03-29: 160 mg via INTRAVENOUS

## 2024-03-29 MED ORDER — STROKE: EARLY STAGES OF RECOVERY BOOK
Freq: Once | Status: AC
  Filled 2024-03-29: qty 1

## 2024-03-29 MED ORDER — PROTAMINE SULFATE 10 MG/ML IV SOLN
INTRAVENOUS | Status: DC | PRN
  Administered 2024-03-29: 30 mg via INTRAVENOUS

## 2024-03-29 MED ORDER — SENNOSIDES-DOCUSATE SODIUM 8.6-50 MG PO TABS: 1.0000 | ORAL_TABLET | Freq: Every evening | ORAL | Status: DC | PRN

## 2024-03-29 MED ORDER — SODIUM BICARBONATE 8.4 % IV SOLN
INTRAVENOUS | Status: DC | PRN
  Administered 2024-03-29: 25 meq via INTRAVENOUS
  Administered 2024-03-29: 50 meq via INTRAVENOUS
  Administered 2024-03-29: 25 meq via INTRAVENOUS

## 2024-03-29 MED ORDER — CEFAZOLIN SODIUM-DEXTROSE 2-3 GM-%(50ML) IV SOLR
INTRAVENOUS | Status: DC | PRN
  Administered 2024-03-29: 2 g via INTRAVENOUS

## 2024-03-29 MED ORDER — TENECTEPLASE 25 MG IV KIT
0.2500 mg/kg | PACK | Freq: Once | INTRAVENOUS | Status: AC
  Administered 2024-03-29: 21 mg via INTRAVENOUS

## 2024-03-29 MED ORDER — INSULIN ASPART 100 UNIT/ML IJ SOLN
0.0000 [IU] | INTRAMUSCULAR | Status: DC
  Administered 2024-03-29: 5 [IU] via SUBCUTANEOUS
  Administered 2024-03-30: 3 [IU] via SUBCUTANEOUS
  Administered 2024-03-30: 5 [IU] via SUBCUTANEOUS
  Filled 2024-03-29: qty 5
  Filled 2024-03-29: qty 1
  Filled 2024-03-29: qty 5

## 2024-03-29 MED ORDER — IOHEXOL 350 MG/ML SOLN
75.0000 mL | Freq: Once | INTRAVENOUS | Status: AC | PRN
  Administered 2024-03-29: 75 mL via INTRAVENOUS

## 2024-03-29 MED ORDER — LABETALOL HCL 5 MG/ML IV SOLN: 20.0000 mg | Freq: Once | INTRAVENOUS | Status: DC

## 2024-03-29 MED ORDER — PANTOPRAZOLE SODIUM 40 MG PO TBEC: 40.0000 mg | DELAYED_RELEASE_TABLET | Freq: Every day | ORAL | Status: DC

## 2024-03-29 SURGICAL SUPPLY — 49 items
BAG COUNTER SPONGE SURGICOUNT (BAG) ×2 IMPLANT
BIOPATCH RED 1 DISK 7.0 (GAUZE/BANDAGES/DRESSINGS) IMPLANT
BNDG COMPR ESMARK 6X3 LF (GAUZE/BANDAGES/DRESSINGS) IMPLANT
CANISTER SUCTION 3000ML PPV (SUCTIONS) ×2 IMPLANT
CATH EMB 3FR 80 (CATHETERS) IMPLANT
CATH EMB 4FR 80 (CATHETERS) IMPLANT
CLIP TI MEDIUM 24 (CLIP) ×2 IMPLANT
CLIP TI WIDE RED SMALL 24 (CLIP) ×2 IMPLANT
COVER PROBE W GEL 5X96 (DRAPES) ×2 IMPLANT
CUFF TOURN SGL QUICK 42 (TOURNIQUET CUFF) IMPLANT
CUFF TRNQT CYL 24X4X16.5-23 (TOURNIQUET CUFF) IMPLANT
CUFF TRNQT CYL 34X4.125X (TOURNIQUET CUFF) IMPLANT
DRAIN CHANNEL 19F RND (DRAIN) IMPLANT
DRAPE C-ARM 42X72 X-RAY (DRAPES) ×2 IMPLANT
DRESSING PEEL AND PLC PRVNA 13 (GAUZE/BANDAGES/DRESSINGS) IMPLANT
ELECTRODE REM PT RTRN 9FT ADLT (ELECTROSURGICAL) ×2 IMPLANT
EVACUATOR SILICONE 100CC (DRAIN) IMPLANT
GLOVE BIO SURGEON STRL SZ7.5 (GLOVE) ×2 IMPLANT
GLOVE BIOGEL PI IND STRL 8 (GLOVE) ×2 IMPLANT
GOWN STRL REUS W/ TWL LRG LVL3 (GOWN DISPOSABLE) ×4 IMPLANT
GOWN STRL REUS W/ TWL XL LVL3 (GOWN DISPOSABLE) ×4 IMPLANT
INSERT FOGARTY SM (MISCELLANEOUS) IMPLANT
KIT BASIN OR (CUSTOM PROCEDURE TRAY) ×2 IMPLANT
KIT PREVENA INCISION MGT20CM45 (CANNISTER) IMPLANT
KIT TURNOVER KIT B (KITS) ×2 IMPLANT
PACK PERIPHERAL VASCULAR (CUSTOM PROCEDURE TRAY) ×2 IMPLANT
PAD ARMBOARD POSITIONER FOAM (MISCELLANEOUS) ×4 IMPLANT
SET IV EXT TUBING 30 (IV SETS) IMPLANT
SOLN 0.9% NACL POUR BTL 1000ML (IV SOLUTION) ×4 IMPLANT
SOLN STERILE WATER BTL 1000 ML (IV SOLUTION) ×2 IMPLANT
SPONGE T-LAP 18X18 ~~LOC~~+RFID (SPONGE) IMPLANT
STAPLER SKIN PROX 35W (STAPLE) IMPLANT
STOPCOCK 4 WAY LG BORE MALE ST (IV SETS) IMPLANT
SUT ETHILON 3 0 FSLX (SUTURE) IMPLANT
SUT ETHILON 3 0 PS 1 (SUTURE) IMPLANT
SUT ETHILON 4 0 PS 2 18 (SUTURE) IMPLANT
SUT GORETEX 5 0 TT13 24 (SUTURE) IMPLANT
SUT MNCRL AB 4-0 PS2 18 (SUTURE) ×6 IMPLANT
SUT PROLENE 5 0 C 1 24 (SUTURE) ×2 IMPLANT
SUT PROLENE 6 0 BV (SUTURE) ×2 IMPLANT
SUT PROLENE 7 0 BV 1 (SUTURE) IMPLANT
SUT SILK 2 0 PERMA HAND 18 BK (SUTURE) IMPLANT
SUT SILK 3-0 18XBRD TIE 12 (SUTURE) IMPLANT
SUT VIC AB 2-0 CT1 TAPERPNT 27 (SUTURE) ×4 IMPLANT
SUT VIC AB 3-0 SH 27X BRD (SUTURE) ×6 IMPLANT
TOWEL GREEN STERILE (TOWEL DISPOSABLE) ×2 IMPLANT
TRAY FOLEY MTR SLVR 16FR STAT (SET/KITS/TRAYS/PACK) ×2 IMPLANT
TUBE CONNECTING 20X1/4 (TUBING) IMPLANT
UNDERPAD 30X36 HEAVY ABSORB (UNDERPADS AND DIAPERS) ×2 IMPLANT

## 2024-03-29 NOTE — Anesthesia Preprocedure Evaluation (Signed)
 Anesthesia Evaluation  Patient identified by MRN, date of birth, ID band Patient confused    Reviewed: Unable to perform ROS - Chart review onlyPreop documentation limited or incomplete due to emergent nature of procedure.  History of Anesthesia Complications (+) PONV and history of anesthetic complications  Airway Mallampati: Unable to assess       Dental   Pulmonary shortness of breath, sleep apnea     + decreased breath sounds      Cardiovascular hypertension,  Rhythm:Irregular Rate:Tachycardia     Neuro/Psych   Anxiety     CVA, Residual Symptoms    GI/Hepatic PUD,,,  Endo/Other  diabetes    Renal/GU Renal InsufficiencyRenal diseaseLab Results      Component                Value               Date                      NA                       137                 03/29/2024                K                        4.6                 03/29/2024                CO2                      20 (L)              03/29/2024                GLUCOSE                  156 (H)             03/29/2024                BUN                      28 (H)              03/29/2024                CREATININE               2.10 (H)            03/29/2024                CALCIUM                   8.9                 03/29/2024                GFRNONAA                 26 (L)              03/29/2024                Musculoskeletal  (+) Arthritis ,    Abdominal   Peds  Hematology   Anesthesia Other Findings   Reproductive/Obstetrics                              Anesthesia Physical Anesthesia Plan  ASA: 4 and emergent  Anesthesia Plan: General   Post-op Pain Management:    Induction: Intravenous and Rapid sequence  PONV Risk Score and Plan: 4 or greater and Ondansetron   Airway Management Planned: Oral ETT  Additional Equipment: Arterial line  Intra-op Plan:   Post-operative Plan: Possible Post-op  intubation/ventilation  Informed Consent:      History available from chart only and Only emergency history available  Plan Discussed with: CRNA and Surgeon  Anesthesia Plan Comments:         Anesthesia Quick Evaluation

## 2024-03-29 NOTE — Sedation Documentation (Signed)
.   Bedside report given to RN. Femoral site assessed - Level 1,hematoma marked , dressing is intact. Pulses also assessed.

## 2024-03-29 NOTE — Anesthesia Postprocedure Evaluation (Signed)
 Anesthesia Post Note  Patient: [name]  Procedure(s) Performed: RADIOLOGY WITH ANESTHESIA     Patient location during evaluation: ICU Anesthesia Type: General Post-procedure mental status: Mental Status at baseline. Vital Signs Assessment: post-procedure vital signs reviewed and stable Respiratory status: spontaneous breathing Cardiovascular status: stable Postop Assessment: no apparent nausea or vomiting Anesthetic complications: no               Lauraine KATHEE [place]

## 2024-03-29 NOTE — Op Note (Addendum)
 Date: March 29, 2024  Preoperative diagnosis: Ischemic right lower extremity after right transfemoral access for MCA thrombectomy  Postoperative diagnosis: Same  Procedure: 1.  Repair of right common femoral artery and proximal SFA with removal of closure device and vein patch angioplasty using great saphenous vein 2.  Right lower extremity thrombectomy 3.  Drain placement in right groin wound (19 French Blake) 4.  Incisional Prevena VAC right groin  Surgeon: Dr. Lonni DOROTHA Gaskins, MD  Assistant: Fonda Cheryle Rim, MD and Lucie Apt, [place]  Indications: 78 year old female who presented as a code stroke and went to IR for right transfemoral access for left MCA thrombectomy.  Noted to have ischemic right lower extremity in PACU with no Doppler signals also had a hematoma in the groin.  She was taken emergently operating room after risk-benefits were discussed with her family.  An assistant was needed given the complexity of case and also for repair of the common femoral artery and SFA with vein patch.  Findings: The Angio-Seal closure device was at the junction of the right SFA and common femoral artery.  This appeared to have occluded the artery as there was no palpable pulse in the SFA distal to the closure device and the artery was healthy appearing.  Once we got proximal distal control the closure device was cut out of the artery off the anterior wall and I extended the arteriotomy onto the SFA and common femoral artery where we could visualize the lumen.  Vein patch angioplasty was performed with great saphenous vein after we performed thrombectomy and got some small amount of thrombus from the SFA just distal to the closure device.  Brisk Doppler signals at completion.  Anesthesia: General  Details: Patient was taken to the operating room emergently after discussion with family.  General endotracheal anesthesia was induced.  Bilateral groins and right leg were then prepped and draped  in standard sterile fashion.  Antibiotics were given timeout performed.  I made a transverse incision in the right groin above inguinal crease.  Dissected through the subcutaneous tissue with cerebellar retractors and she had a hematoma that was evacuated.  The subcutaneous tissue was opened with bovie cautery.  She was quite large so this was requiring extensive retraction.  Ultimately the common femoral artery was dissected out as well as SFA and profunda and Vesseloops were used for control.  I gave 100 units/kg IV heparin.  It was evident that the angio seal was in the proximal SFA at the junction of the common femoral artery.  There was no pulse distal to this.  We then used Vesseloops on the SFA profunda and a Henley clamp for the inflow.  I cut out the closure device on the anterior wall of the artery that was quite soft and normal-appearing.  I extended the arteriotomy on the SFA and common femoral where we could visualize the lumen.  A great saphenous vein was then harvested in the incision over a short segment and this was spatulated and we prepared vein patch angioplasty given risk for groin infection.  A #3 #4 Fogarty were passed down the right leg and I got some thrombus from the SFA prior to vein patch.  We then performed vein patch angioplasty of the common femoral into the SFA with 5-0 Prolene parachute technique with the help of my assistant and this was de-aired prior to completion.  The groin was then copiously irrigated.  We tunneled a 33 French Blake drain into the subcutaneous tissue.  The groin was closed in multiple layers of 2-0 Vicryl 3-0 Vicryl staples in the skin with Prevena VAC.  Complication: None  Condition: Critical  Lonni DOROTHA Gaskins, MD [facility] Office: [phone]   Lonni JINNY Gaskins

## 2024-03-29 NOTE — Sedation Documentation (Signed)
 Patient moved to table by staff , secured, hooked to monitors, and now under the care of anesthesia. Please see charting in vitals per CRNA.

## 2024-03-29 NOTE — Progress Notes (Signed)
 MD Rosslyn called again regarding edema in patient's right lower extremity/inability to find pulses in foot. Per MD- coming to bedside at this time.

## 2024-03-29 NOTE — Progress Notes (Addendum)
 eLink Physician-Brief Progress Note Patient Name: [name] DOB: [birth year] MRN: [id number]   Date of Service  03/29/2024  HPI/Events of Note  78 y.o. female with hx of Afib (not on OAC), HTN, DM2,CKD, CKD, OSA who was BIB EMS as a CODE STROKE due to acute onset of aphasia and right-sided weakness found to have a left MCA thrombus status post lytics and left MCA thrombectomy complicated by ischemic right lower extremity returning to the ICU after repair of right common femoral artery with vein patch angioplasty and distal thrombectomy with wound VAC to the right groin.  Patient is tachycardic, mildly hypotensive saturating 98% on nasal cannula.  Running norepinephrine  infusion.  Results consistent with elevated creatinine, polycythemia.  CT head reviewed.  eICU Interventions  Post thrombectomy SBP goal <160, maintain Cleviprex  and as needed labetalol , norepinephrine  on the lower end to maintain MAP greater than 65  Substantial arterial wave variability and tachycardia suggestive of fluid responsive state-LR bolus.  Monitor hemoglobins with morning labs  Neuroprotective measures- normothermia, euglycemia, HOB greater than 30,  normocapnia, normoxia   Standard stroke workup with MRI, echocardiogram, risk stratification labs  Holding home analgesia-gabapentin , oxycodone   Continue glycemic control with sliding scale insulin   DVT prophylaxis with SCD to the left leg GI prophylaxis with home pantoprazole    0253 -lactic acid 4.0, persistent arterial wave variability and tachycardia. repeat at noon; additional liter of fluids over 4 hours  0620 - Magnesium   Intervention Category Evaluation Type: New Patient Evaluation  [name] 03/29/2024, 9:56 PM

## 2024-03-29 NOTE — Anesthesia Procedure Notes (Signed)
 Arterial Line Insertion Start/End11/20/2025 1:52 PM, 03/29/2024 1:55 PM Performed by: Lamar Lucie DASEN, CRNA, CRNA  Patient location: OOR procedure area. Preanesthetic checklist: patient identified, IV checked, site marked, risks and benefits discussed, surgical consent, monitors and equipment checked, pre-op evaluation, timeout performed and anesthesia consent Left, radial was placed Catheter size: 20 G Hand hygiene performed  and maximum sterile barriers used   Attempts: 1 Procedure performed using ultrasound to evaluate access site. Ultrasound Notes:relevant anatomy identified, ultrasound used to visualize needle entry and vessel patent under ultrasound. Following insertion, dressing applied and Biopatch. Post procedure assessment: normal and unchanged  Patient tolerated the procedure well with no immediate complications.

## 2024-03-29 NOTE — ED Provider Notes (Signed)
 [place] [facility]MENT AT [facility] Provider Note   CSN: [id number] Arrival date & time: 03/29/24  1303     Patient presents with: No chief complaint on file.   [name] is a 78 y.o. female.   The history is provided by the EMS personnel, medical records and the patient. The history is limited by the condition of the patient. No language interpreter was used.  Neurologic Problem This is a new problem. The current episode started less than 1 hour ago. The problem occurs constantly. The problem has not changed since onset.Pertinent negatives include no chest pain, no abdominal pain, no headaches and no shortness of breath. Nothing aggravates the symptoms. Nothing relieves the symptoms. She has tried nothing for the symptoms. The treatment provided no relief.       Prior to Admission medications   Medication Sig Start Date End Date Taking? Authorizing Provider  acetaminophen  (TYLENOL ) 500 MG tablet Take 500 mg by mouth every 6 (six) hours as needed.    [provider]  allopurinol  (ZYLOPRIM ) 100 MG tablet Take 100 mg by mouth daily.    [provider]  ALPRAZolam  (XANAX ) 0.5 MG tablet Take 0.5 mg by mouth 2 (two) times daily as needed for anxiety.    [provider]  amLODipine  (NORVASC ) 10 MG tablet Take 10 mg by mouth daily.  07/05/16   [provider]  aspirin  EC 81 MG tablet Take 81 mg by mouth daily. Swallow whole.    [provider]  Chlorpheniramine-DM 4-30 MG TABS Take by mouth.    [provider]  Cholecalciferol  (VITAMIN D3) 50 MCG (2000 UT) TABS Take 2,000 Units by mouth daily.    [provider]  furosemide  (LASIX ) 20 MG tablet Take 20 mg by mouth daily as needed for edema.    [provider]  gabapentin  (NEURONTIN ) 100 MG capsule Take 100 mg by mouth 2 (two) times daily. 08/22/18   [provider]  glipiZIDE  (GLUCOTROL ) 5 MG tablet Take 5 mg by mouth daily before  breakfast.    [provider]  loperamide  (IMODIUM ) 2 MG capsule Take by mouth as needed for diarrhea or loose stools.    [provider]  Melatonin 5 MG CAPS Take 1 tablet by mouth at bedtime as needed for sleep.    [provider]  metFORMIN  (GLUCOPHAGE ) 500 MG tablet Take 1 tablet by mouth 3 (three) times daily. 06/12/21   [provider]  metoprolol  (LOPRESSOR ) 100 MG tablet Take 100 mg by mouth daily.     [provider]  mupirocin  ointment (BACTROBAN ) 2 % Apply 1 Application topically 2 (two) times daily. 02/20/24   Corlis Burnard DEL, NP  nystatin  cream (MYCOSTATIN ) Apply to affected area 2 times daily 02/20/24   Corlis Burnard DEL, NP  ondansetron  (ZOFRAN -ODT) 4 MG disintegrating tablet Take 1 tablet (4 mg total) by mouth every 8 (eight) hours as needed for nausea or vomiting. 08/21/23   Suzanne Kirsch, MD  OVER THE COUNTER MEDICATION doTerra for healthy immnune system    [provider]  oxyCODONE  (OXY IR/ROXICODONE ) 5 MG immediate release tablet Take 1 tablet (5 mg total) by mouth every 4 (four) hours as needed for moderate pain (pain score 4-6). 11/07/21   Verlinda Boas, PA-C  pantoprazole  (PROTONIX ) 40 MG tablet Take 1 tablet (40 mg total) by mouth daily. 09/12/22   Patel, Sona, MD  rosuvastatin  (CRESTOR ) 10 MG tablet Take 10 mg by mouth daily.  [provider]  Semaglutide (OZEMPIC, 0.25 OR 0.5 MG/DOSE, [place]) Inject 0.5 mg into the skin every 7 (seven) days.    [provider]  trimethoprim  (TRIMPEX ) 100 MG tablet Take 1 tablet (100 mg total) by mouth daily. 02/27/24   Gaston Hamilton, MD  vitamin B-12 (CYANOCOBALAMIN ) 500 MCG tablet Take 500 mcg by mouth daily.    [provider]    Allergies: Prednisone, Lactose intolerance (gi), Morphine  and codeine, and Vicodin [hydrocodone-acetaminophen ]    Review of Systems  Unable to perform ROS: Acuity of condition  Constitutional:  Negative for chills and fever.  HENT:   Negative for congestion.   Respiratory:  Negative for cough, chest tightness and shortness of breath.   Cardiovascular:  Negative for chest pain.  Gastrointestinal:  Negative for abdominal pain, constipation, diarrhea, nausea and vomiting.  Genitourinary:  Negative for dysuria.  Musculoskeletal:  Negative for back pain.  Skin:  Negative for rash.  Neurological:  Positive for facial asymmetry, speech difficulty, weakness and numbness. Negative for headaches.  Psychiatric/Behavioral:  Negative for agitation.   All other systems reviewed and are negative.   Updated Vital Signs Wt 83.2 kg   BMI 32.49 kg/m   Physical Exam Vitals and nursing note reviewed.  Constitutional:      General: She is in acute distress.     Appearance: She is well-developed. She is not ill-appearing, toxic-appearing or diaphoretic.  HENT:     Head: Normocephalic and atraumatic.     Nose: No congestion or rhinorrhea.     Mouth/Throat:     Mouth: Mucous membranes are moist.     Pharynx: No oropharyngeal exudate or posterior oropharyngeal erythema.  Eyes:     General: Visual field deficit present.     Conjunctiva/sclera: Conjunctivae normal.     Pupils: Pupils are equal, round, and reactive to light.  Cardiovascular:     Rate and Rhythm: Normal rate and regular rhythm.     Pulses: Normal pulses.     Heart sounds: No murmur heard. Pulmonary:     Effort: Pulmonary effort is normal. No respiratory distress.     Breath sounds: Normal breath sounds. No wheezing, rhonchi or rales.  Chest:     Chest wall: No tenderness.  Abdominal:     General: Abdomen is flat.     Palpations: Abdomen is soft.     Tenderness: There is no abdominal tenderness. There is no guarding or rebound.  Musculoskeletal:        General: No swelling or tenderness.     Cervical back: Neck supple. No tenderness.  Skin:    General: Skin is warm and dry.     Capillary Refill: Capillary refill takes less than 2 seconds.     Findings: No  erythema or rash.  Neurological:     Mental Status: She is alert.     Sensory: Sensory deficit present.     Motor: Weakness and abnormal muscle tone present. No seizure activity.     Comments: Right facial droop, right arm and right leg weakness compared to left.  Numbness in right face right arm and right leg.  Patient preferring to look to the left.  Decreased blink to threat on the right.  Pupils symmetric and reactive.  Significant aphasia but can nod her head yes and no and say some yes and no.  Gait and coordination not tested initially.  Psychiatric:        Mood and Affect: Mood normal.     (  all labs ordered are listed, but only abnormal results are displayed) Labs Reviewed  CBC - Abnormal; Notable for the following components:      Result Value   Hemoglobin 15.9 (*)    HCT 47.2 (*)    All other components within normal limits  I-STAT CHEM 8, ED - Abnormal; Notable for the following components:   BUN 28 (*)    Creatinine, Ser 2.10 (*)    Glucose, Bld 156 (*)    Hemoglobin 17.0 (*)    HCT 50.0 (*)    All other components within normal limits  CBG MONITORING, ED - Abnormal; Notable for the following components:   Glucose-Capillary 171 (*)    All other components within normal limits  DIFFERENTIAL  COMPREHENSIVE METABOLIC PANEL WITH GFR  ETHANOL  PROTIME-INR  APTT    EKG: None  Radiology: CT ANGIO HEAD NECK W WO CM (CODE STROKE) Result Date: 03/29/2024 EXAM: CTA Head and Neck with Intravenous Contrast. CT Head without Contrast. CLINICAL HISTORY: Neuro deficit, acute, stroke suspected. TECHNIQUE: Axial CTA images of the head and neck performed with intravenous contrast. MIP reconstructed images were created and reviewed. Axial computed tomography images of the head/brain performed without intravenous contrast. Note: Per PQRS, the description of internal carotid artery percent stenosis, including 0 percent or normal exam, is based on [place] [employer] Symptomatic Carotid  Endarterectomy Trial (NASCET) criteria. Dose reduction technique was used including one or more of the following: automated exposure control, adjustment of mA and kV according to patient size, and/or iterative reconstruction. CONTRAST: With and without. 75 mL iohexol  (OMNIPAQUE ) 350 MG/ML injection. COMPARISON: Same day CT head. FINDINGS: CT HEAD: BRAIN: No acute intraparenchymal hemorrhage. No mass lesion. Abrupt cutoff of the mid M1 segment of the left MCA. Irregularity and possible intraluminal filling defect which could reflect thrombus. Findings are compatible with acute large vessel occlusion of the left middle cerebral artery. Filling of distal left MCA branches likely via collaterals. Overall mild to moderate appearance of collateralization of the left MCA. The right MCA is patent. The anterior cerebral arteries are patent bilaterally. The basilar artery is patent. Moderate stenosis of the posterior P2 segment of the right PCA. Fetal origin of the left PCA. Superior cerebellar arteries are patent bilaterally. Dominant AICA on the right. No midline shift or extra-axial collection. VENTRICLES: No hydrocephalus. ORBITS: The orbits are unremarkable. SINUSES AND MASTOIDS: The paranasal sinuses and mastoid air cells are clear. CTA NECK: COMMON CAROTID ARTERIES: The right carotid artery is patent from the origin to the skull base. Partial retropharyngeal course of the proximal right cervical ICA. Atherosclerosis at the carotid bifurcation resulting in approximately 50% stenosis at the origin of the right cervical ICA. The left carotid artery is patent from the origin to the skull base. Calcified atherosclerosis at the left carotid bifurcation without hemodynamically significant stenosis. Mild tortuosity of the distal left cervical ICA. No dissection or occlusion. INTERNAL CAROTID ARTERIES: Right cervical ICA: Approximately 50% stenosis at the origin (NASCET criteria). Partial retropharyngeal course of the proximal  right cervical ICA. Left cervical ICA: Calcified atherosclerosis at the left carotid bifurcation without hemodynamically significant stenosis (NASCET criteria). Mild tortuosity of the distal left cervical ICA. Intracranial internal carotid arteries are patent bilaterally. Atherosclerosis throughout the bilateral carotid siphons. Mild stenosis of the right cavernous and supraclinoid ICA. Moderate stenosis of the left cavernous and supraclinoid ICA. 6 x 4 mm medially projecting aneuryms at the posterior genu/horizontal segment of the left cavernous ICA. No dissection or occlusion. VERTEBRAL  ARTERIES: The nondominant right vertebral artery is occluded from its origin. There is reconstitution of the vessel at the distal V2 segment. There is significant irregularity and diminished contrast of the right V3 segment with focal occlusion of the distal V3 segment. There is reconstitution of the distal V3 and right V4 segment via a muscular collateral. Atherosclerosis of the right V4 segment resulting in focal severe stenosis. There is irregularity and severe stenosis of the post PICA portion of the right V4 segment. Findings of the right vertebral artery are favored to be chronic. The left vertebral artery is patent from the origin to the vertebrobasilar confluence. Mild atherosclerosis of the left P4 segment resulting in mild stenosis. No dissection. CTA HEAD: ANTERIOR CEREBRAL ARTERIES: Patent bilaterally. No significant stenosis. No occlusion. No aneurysm. MIDDLE CEREBRAL ARTERIES: Abrupt cutoff of the mid M1 segment of the left MCA. Irregularity and possible intraluminal filling defect which could reflect thrombus. Findings are compatible with acute large vessel occlusion of the left middle cerebral artery. Filling of distal left MCA branches likely via collaterals. Overall mild to moderate appearance of collateralization of the left MCA. The right MCA is patent. No aneurysm. POSTERIOR CEREBRAL ARTERIES: Moderate stenosis  of the posterior P2 segment of the right PCA. Fetal origin of the left PCA. No occlusion. No aneurysm. BASILAR ARTERY: Patent. No significant stenosis. No occlusion. No aneurysm. OTHER: Mild to moderate atherosclerosis of the visualized aorta. There is flow artifact within the inferior aspect of the aortic arch. The subclavian arteries are patent bilaterally with mild atherosclerosis noted without evidence of high grade stenosis. SOFT TISSUES: Calcified subcentimeter nodule in the left thyroid  lobe. Patchulous appearance of the upper thoracic esophagus. No masses or lymphadenopathy. BONES: Degenerative changes throughout the visualized spine. There is 4 mm anterolisthesis of C3 on C4, 5 mm anterolisthesis of C4 on C5, and 3 mm anterolisthesis of C5 on C6 likely related to degenerative changes. Disc space narrowing is greatest at C6-C7. No acute osseous abnormality. IMPRESSION: 1. Acute large vessel occlusion of the left middle cerebral artery at the mid M1 segment. Possible intraluminal thrombus in the M1 segment. Mild to moderate collateralization of distal MCA branches. 2. 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 3. Right vertebral artery occlusion with distal reconstitution and severe stenosis, favored chronic. 4. Mild stenosis of the right cavernous and supraclinoid ICA and moderate stenosis of the left cavernous and supraclinoid ICA. 5. Approximately 50% stenosis at the origin of the right internal carotid artery. 6. Multilevel degenerative changes of the cervical spine as above. 7. Finding of left MCA occlusion sent to Dr. Rosemarie via the [facility] messaging system at 1:25 PM on 03/29/24. Electronically signed by: Donnice Mania MD 03/29/2024 01:41 PM EST RP Workstation: [id number]   CT HEAD CODE STROKE WO CONTRAST Result Date: 03/29/2024 EXAM: CT HEAD WITHOUT CONTRAST 03/29/2024 01:12:23 PM TECHNIQUE: CT of the head was performed without the administration of intravenous contrast.  Automated exposure control, iterative reconstruction, and/or weight based adjustment of the mA/kV was utilized to reduce the radiation dose to as low as reasonably achievable. COMPARISON: CT head 09/10/2022 CLINICAL HISTORY: Neuro deficit, acute, stroke suspected. FINDINGS: BRAIN AND VENTRICLES: No acute hemorrhage. No evidence of acute infarct. No hydrocephalus. No extra-axial collection. No mass effect or midline shift. Mild age related parenchymal volume loss. Moderate chronic microvascular ischemic changes. There is a remote infarct in the posterior left cerebellum. Heterogeneous appearance of the basal ganglia with multiple bilateral remote lacunar infarcts. The  most prominent focus of remote lacunar infarct in the posterior aspect of the left basal ganglia is unchanged. There is likely an additional small remote lacunar infarct in the left thalamus. Atherosclerosis of the carotid siphons. [place] stroke program early CT (ASPECT) score: Ganglionic (caudate, IC, lentiform nucleus, insula, M1-M3): 7 Supraganglionic (M4-M6): 3 Total: 10 ORBITS: Bilateral lens replacement. SINUSES: No acute abnormality. SOFT TISSUES AND SKULL: No acute soft tissue abnormality. No skull fracture. IMPRESSION: 1. No acute intracranial hemorrhage. 2. Heterogeneous basal ganglia with multiple bilateral remote lacunar infarcts as above. Small acute lacunar infarct difficult to exclude. 3. Mild age-related parenchymal volume loss. 4. Moderate chronic microvascular ischemic changes. 5. Remote infarct in the posterior left cerebellum. 6. ASPECTS 10. 7. Findings messaged to Dr. Rosemarie at 1:23 PM on 03/29/24. Electronically signed by: Donnice Mania MD 03/29/2024 01:24 PM EST RP Workstation: [id number]     Procedures   CRITICAL CARE Performed by: Lonni PARAS Britta Louth Total critical care time: 25 minutes Critical care time was exclusive of separately billable procedures and treating other patients. Critical care was necessary to treat  or prevent imminent or life-threatening deterioration. Critical care was time spent personally by me on the following activities: development of treatment plan with patient and/or surrogate as well as nursing, discussions with consultants, evaluation of patient's response to treatment, examination of patient, obtaining history from patient or surrogate, ordering and performing treatments and interventions, ordering and review of laboratory studies, ordering and review of radiographic studies, pulse oximetry and re-evaluation of patient's condition.  Medications Ordered in the ED  sodium chloride  flush (NS) 0.9 % injection 3 mL (has no administration in time range)  tenecteplase  (TNKASE ) injection for stroke 21 mg (has no administration in time range)  labetalol  (NORMODYNE ) 5 MG/ML injection (has no administration in time range)  iohexol  (OMNIPAQUE ) 350 MG/ML injection 75 mL (75 mLs Intravenous Contrast Given 03/29/24 1321)                                    Medical Decision Making Amount and/or Complexity of Data Reviewed Labs: ordered. Radiology: ordered.  Risk Decision regarding hospitalization.    Jennafer Krishana Lutze is a 78 y.o. female with a past medical history significant for hypertension, CKD, diabetes, previous TIA and stroke atrial fibrillation not on anticoagulation therapy, and previous kidney stones who presents as a code stroke.  According to EMS, at 1210 patient had a witnessed neurological change and has significant aphasia and right sided weakness in her face arm and leg.  She also has numbness in her right side and is looking more to the left.  Clinically there is concern for LVO.  EMS reports glucose was about 170 and patient's heart rate was in the 120 range with A-fib.  She reportedly has had some recent falls but no reported head injury and she reportedly was at her baseline this morning.  On arrival, airway is intact.  She can open her mouth and was saying yes and  no to some answers.  No difficulty breathing.  Patient taken to CT scanner and I did not see evidence of bleeding on my evaluation.  Clinically we are concerned about LVO.  She received CTA and was activated as a code IR for possible intervention.  On my other reassessment patient has symmetric breath sounds and nontender chest or abdomen.  She has numbness in her right face right arm and right leg and  has right facial droop.  She is preferring to look to the left and would not look towards the right for me.  Pupils were symmetric and reactive however.  She had a decreased blink to threat on the right.  She had flaccidity in her right arm and right leg.  Abdomen and chest nontender.  She will be admitted by neurology for further management of acute stroke      Final diagnoses:  Cerebrovascular accident (CVA), unspecified mechanism (HCC)  Acute right-sided weakness  Right sided numbness  Aphasia     Clinical Impression: 1. Cerebrovascular accident (CVA), unspecified mechanism (HCC)   2. Acute right-sided weakness   3. Right sided numbness   4. Aphasia     Disposition: Admit  This note was prepared with assistance of Dragon voice recognition software. Occasional wrong-word or sound-a-like substitutions may have occurred due to the inherent limitations of voice recognition software.     Win Guajardo, Lonni PARAS, MD 03/29/24 1538

## 2024-03-29 NOTE — Progress Notes (Signed)
 MD Rosslyn called regarding edema in patient's right lower extremity/inability to find pulses in foot. No answer at this time.

## 2024-03-29 NOTE — Code Documentation (Addendum)
 Stroke Response Nurse Documentation Code Documentation  [name] is a 78 y.o. female arriving to [facility] via [facility] on 03/29/2024 with past medical hx of HTN AF CKD DM CVA. On No antithrombotic. Code stroke was activated by EMS.   Patient from home where she was LKW at 1210 and now complaining of Aphasia, rt sided weakness.  Stroke team at the bedside on patient arrival. Labs drawn and patient cleared for CT by EDP. Patient to CT with team. NIHSS 17, see documentation for details and code stroke times. Patient with disoriented, right facial droop, right arm weakness, right leg weakness, right decreased sensation, Expressive aphasia , and dysarthria  on exam. The following imaging was completed:  CT Head and CTA. Patient is a candidate for IV Thrombolytic due to fixed neurological deficit. Patient is a candidate for IR due to large vessel occlusion..   Care Plan:    TNK:  Q53min x 2 hours, q30min x 6 hours, q1h x 16 hours until 24 hour mark  BP < 180/105  Call Neuro for:  New Headache, worsening symptoms, bleeding, nausea/vomiting, or any signs of angioedema.   IR: VS & NIHSS: Q63min x 1 hour, Q1min x 1 hour, then Q1h x 22 hrs Vascular checks and distal pulse (s/p sheath d/c) : q15min x4, q30min x2, q1h x4, then q2h .   Process Delays Noted: BP lowering, consent process  Bedside handoff with IR RN Katie.  Majid Mccravy Livengood  Stroke Response RN

## 2024-03-29 NOTE — Anesthesia Procedure Notes (Signed)
 Procedure Name: Intubation Date/Time: 03/29/2024 1:51 PM  Performed by: Claudene Arlin LABOR, CRNAPre-anesthesia Checklist: Patient identified, Emergency Drugs available, Suction available and Patient being monitored Patient Re-evaluated:Patient Re-evaluated prior to induction Oxygen Delivery Method: Circle system utilized Preoxygenation: Pre-oxygenation with 100% oxygen Induction Type: IV induction and Rapid sequence Laryngoscope Size: Miller and 2 Grade View: Grade I Tube type: Oral Tube size: 7.0 mm Number of attempts: 1 Airway Equipment and Method: Stylet Placement Confirmation: ETT inserted through vocal cords under direct vision, positive ETCO2 and breath sounds checked- equal and bilateral Secured at: 21 cm Tube secured with: Tape Dental Injury: Teeth and Oropharynx as per pre-operative assessment

## 2024-03-29 NOTE — Transfer of Care (Signed)
 Immediate Anesthesia Transfer of Care Note  Patient: [name]  Procedure(s) Performed: REPAIR OF RIGHT COMMON FEMORAL ARTERY AND SUPERFICIAL FEMORAL ARTERY WITH VEIN PATCH ANGIOPLASTY (Right) RIGHT LEG THROMBECTOMY APPLICATION, WOUND VAC  Patient Location: PACU  Anesthesia Type:General  Level of Consciousness: awake and alert   Airway & Oxygen Therapy: Patient Spontanous Breathing and Patient connected to nasal cannula oxygen  Post-op Assessment: Report given to RN and Post -op Vital signs reviewed and stable  Post vital signs: Reviewed and stable  Last Vitals:  Vitals Value Taken Time  BP 101/90 03/29/24 19:30  Temp 35.3 C 03/29/24 19:34  Pulse 123 03/29/24 19:34  Resp 16 03/29/24 19:34  SpO2 97 % 03/29/24 19:34  Vitals shown include unfiled device data.  Last Pain:  Vitals:   03/29/24 1920  PainSc: Asleep         Complications: No notable events documented.

## 2024-03-29 NOTE — ED Notes (Signed)
 Unable to assess post-TNK NIH d/t pt in IR

## 2024-03-29 NOTE — Consult Note (Addendum)
 NAME:  [name], MRN:  [id number], DOB:  [birth year], LOS: 0 ADMISSION DATE:  03/29/2024, CONSULTATION DATE:  11/20 REFERRING MD:  Dr. Matthews, CHIEF COMPLAINT:  R sided weakenss   History of Present Illness:  Patient is encephalopathic and/or intubated; therefore, history has been obtained from chart review.  78 year old female with past medical history as below, which is significant for atrial fibrillation not on anticoagulation, hypertension, diabetes mellitus type 2, CKD, and OSA who presented to [facility] emergency department 11/20 with complaints of acute onset aphasia and right-sided weakness.  She was evaluated as a code stroke in the emergency department and was noted to be disoriented with a right facial droop or right arm weakness, and right leg weakness.  Initial CT of the head was negative for hemorrhage and CT angiogram showed acute large vessel occlusion of the left MCA.  Case was discussed with the multidisciplinary stroke team and the patient was administered intravenous TNK and was taken to IR for mechanical thrombectomy which was completed with aspiration to a TICI III result.  At the completion of the interventional radiology procedure the absence of Doppler signal in the right foot was noted and vascular surgery was consulted.  She was taken to the operating room by Dr. Gretta for repair of the right common femoral artery and proximal SFA.  Drain and wound VAC were placed.  Doppler signals were returned and the patient was transferred to the ICU for close monitoring and recovery.  PCCM is consulted for assistance with ICU management.   Pertinent  Medical History   has a past medical history of A-fib (HCC), Arthritis, Chronic kidney disease, Complication of anesthesia, Diabetes mellitus without complication (HCC), Dyspnea, Dysuria, History of kidney stones, Hypertension, Osteopenia, PONV (postoperative nausea and vomiting), Sleep apnea, Stroke (HCC), and Urine,  incontinence, stress female.   Significant Hospital Events: Including procedures, antibiotic start and stop dates in addition to other pertinent events   11/20 presented with right-sided weakness, given TNK and taken to IR.  After IR absent pulses distal to the surgical site.  Taken to the OR by vascular surgery for repair of the common femoral artery and SFA.  Interim History / Subjective:    Objective    Blood pressure (!) 89/73, pulse (!) 121, temperature (!) 96.3 F (35.7 C), resp. rate 15, weight 83.2 kg, SpO2 94%.        Intake/Output Summary (Last 24 hours) at 03/29/2024 2146 Last data filed at 03/29/2024 2030 Gross per 24 hour  Intake 4000 ml  Output 320 ml  Net 3680 ml   Filed Weights   03/29/24 1300  Weight: 83.2 kg    Examination: General: Elderly appearing overweight female in no acute distress HENT: Normocephalic, atraumatic, PERRL Lungs: Clear bilateral breath sounds Cardiovascular: Tachycardic, regular, no MRG Abdomen: Soft, nontender, nondistended Extremities: Wound VAC and drain bulb in the right groin. DP and PT pulses dopplerable on the right.  Neuro: Somnolent, sonorous respirations, does arouse to tactile stimuli and follows commands on the left.  Does not respond verbally. GU: Foley catheter  Resolved problem list   Assessment and Plan   Acute left MCA stroke status post systemic TNK as well as mechanical thrombectomy - management per stroke service.  - Keep SBP 120-160 mmHg - Lipid panel - Echo - Keep CBG 140-180. CBG and SSI ordered.  - Clevi PRN - MRI tonight - CT head repeat at 24 hours.   Atrial fibrillation - seems to have been postoperatively  several years ago. As far as family is concerned she does not have A fib. Is not on anticoagulation.  - EKG now - Continue telemetry monitoring  Critical limb ischemia of the RLE following IR procedure: s/p repair 11/20 - Neurovascular checks  Acute encephalopathy: anesthesia + aphasia.  Post op Ct head without hemorrhage or mass effect. - MRI pending - Check ABG  Acute blood loss anemia in the postoperative setting - one unit PRB from OR transfusing now - repeat H&H post transfusion  Hypertension - holding home amlodipine , furosemide , losartan, metoprolol   DM2 - CBG monitoring and SSI  AKI superimposed on CKD - Trend chemistry and urine output - Keep foley for now  OSA - not on CPAP  SCD  Family updated at bedside, all questions answered.  Labs   CBC: Recent Labs  Lab 03/29/24 1311 03/29/24 1312  WBC 8.9  --   NEUTROABS 5.6  --   HGB 15.9* 17.0*  HCT 47.2* 50.0*  MCV 94.2  --   PLT 228  --     Basic Metabolic Panel: Recent Labs  Lab 03/29/24 1311 03/29/24 1312  NA 135 137  K 4.7 4.6  CL 105 103  CO2 20*  --   GLUCOSE 159* 156*  BUN 23 28*  CREATININE 1.96* 2.10*  CALCIUM  8.9  --    GFR: Estimated Creatinine Clearance: 22.2 mL/min (A) (by C-G formula based on SCr of 2.1 mg/dL (H)). Recent Labs  Lab 03/29/24 1311  WBC 8.9    Liver Function Tests: Recent Labs  Lab 03/29/24 1311  AST 30  ALT 34  ALKPHOS 60  BILITOT 0.8  PROT 6.3*  ALBUMIN 3.0*   No results for input(s): LIPASE, AMYLASE in the last 168 hours. No results for input(s): AMMONIA in the last 168 hours.  ABG    Component Value Date/Time   TCO2 23 03/29/2024 1312     Coagulation Profile: No results for input(s): INR, PROTIME in the last 168 hours.  Cardiac Enzymes: No results for input(s): CKTOTAL, CKMB, CKMBINDEX, TROPONINI in the last 168 hours.  HbA1C: Hgb A1c MFr Bld  Date/Time Value Ref Range Status  10/23/2021 03:01 PM 6.7 (H) 4.8 - 5.6 % Final    Comment:    (NOTE) Pre diabetes:          5.7%-6.4%  Diabetes:              >6.4%  Glycemic control for   <7.0% adults with diabetes     CBG: Recent Labs  Lab 03/29/24 1305 03/29/24 1547 03/29/24 1915  GLUCAP 171* 184* 219*    Review of Systems:   Patient is  encephalopathic and/or intubated; therefore, history has been obtained from chart review.   Past Medical History:  She,  has a past medical history of A-fib (HCC), Arthritis, Chronic kidney disease, Complication of anesthesia, Diabetes mellitus without complication (HCC), Dyspnea, Dysuria, History of kidney stones, Hypertension, Osteopenia, PONV (postoperative nausea and vomiting), Sleep apnea, Stroke (HCC), and Urine, incontinence, stress female.   Surgical History:   Past Surgical History:  Procedure Laterality Date   BREAST BIOPSY Right 11/22/2019   U/S Bx-HYDROMARK-BENIGN INTRAMAMMARY LYMPH NODE.   COLONOSCOPY W/ POLYPECTOMY Right    COLONOSCOPY WITH PROPOFOL  N/A 01/09/2015   Procedure: COLONOSCOPY WITH PROPOFOL ;  Surgeon: Lamar ONEIDA Holmes, MD;  Location: [facility] ENDOSCOPY;  Service: Endoscopy;  Laterality: N/A;   ERCP N/A 03/15/2019   Procedure: ENDOSCOPIC RETROGRADE CHOLANGIOPANCREATOGRAPHY (ERCP);  Surgeon: Jinny Carmine, MD;  Location:  [facility] ENDOSCOPY;  Service: Endoscopy;  Laterality: N/A;   EYE SURGERY  2013   bilateral cataracts with implants   JOINT REPLACEMENT     left knee  TKR   KIDNEY STONE SURGERY     40 yrs ago, while pregnant, she had to 'be cut in 1/2 to get stone out'   KNEE ARTHROPLASTY Right 11/06/2021   Procedure: COMPUTER ASSISTED TOTAL KNEE ARTHROPLASTY - DEPUY;  Surgeon: Mardee Lynwood SQUIBB, MD;  Location: [facility] ORS;  Service: Orthopedics;  Laterality: Right;   KNEE ARTHROSCOPY     left knee   LITHOTRIPSY     ltk Left    LUMBAR LAMINECTOMY/DECOMPRESSION MICRODISCECTOMY N/A 09/06/2016   Procedure: LAMINECTOMY AND FORAMINOTOMY LUMBAR THREE- LUMBAR FOUR, LUMBAR FOUR- LUMBAR FIVE;  Surgeon: Reyes Budge, MD;  Location: [facility] OR;  Service: Neurosurgery;  Laterality: N/A;  LAMINECTOMY AND FORAMINOTOMY LUMBAR 3- LUMBAR 4, LUMBAR 4- LUMBAR 5    REVERSE SHOULDER ARTHROPLASTY Right 12/08/2020   Procedure: Right reverse total shoulder arthroplasty and  biceps tenodesis;  Surgeon:  Tobie Priest, MD;  Location: [facility] ORS;  Service: Orthopedics;  Laterality: Right;   throidectomy  1979   goiter   TRIGGER FINGER RELEASE Left      Social History:   reports that she has never smoked. She has never used smokeless tobacco. She reports current drug use. She reports that she does not drink alcohol.   Family History:  Her family history is negative for Breast cancer.   Allergies Allergies  Allergen Reactions   Prednisone Other (See Comments)   Lactose Intolerance (Gi)     Milk    Morphine  And Codeine Nausea And Vomiting   Vicodin [Hydrocodone-Acetaminophen ] Nausea And Vomiting    Tolerates acetaminophen      Home Medications  Prior to Admission medications   Medication Sig Start Date End Date Taking? Authorizing Provider  fluconazole (DIFLUCAN) 150 MG tablet Take 150 mg by mouth once. 03/19/24  Yes [provider]  losartan (COZAAR) 50 MG tablet Take 50 mg by mouth daily. 01/10/24 01/09/25 Yes [provider]  acetaminophen  (TYLENOL ) 500 MG tablet Take 500 mg by mouth every 6 (six) hours as needed.    [provider]  allopurinol  (ZYLOPRIM ) 100 MG tablet Take 100 mg by mouth daily.    [provider]  ALPRAZolam  (XANAX ) 0.5 MG tablet Take 0.5 mg by mouth 2 (two) times daily as needed for anxiety.    [provider]  amLODipine  (NORVASC ) 10 MG tablet Take 10 mg by mouth daily.  07/05/16   [provider]  aspirin  EC 81 MG tablet Take 81 mg by mouth daily. Swallow whole.    [provider]  Chlorpheniramine-DM 4-30 MG TABS Take by mouth.    [provider]  Cholecalciferol  (VITAMIN D3) 50 MCG (2000 UT) TABS Take 2,000 Units by mouth daily.    [provider]  furosemide  (LASIX ) 20 MG tablet Take 20 mg by mouth daily as needed for edema.    [provider]  gabapentin  (NEURONTIN ) 100 MG capsule Take 100 mg by mouth 2 (two) times daily. 08/22/18   [provider]  glipiZIDE   (GLUCOTROL ) 5 MG tablet Take 5 mg by mouth daily before breakfast.    [provider]  loperamide  (IMODIUM ) 2 MG capsule Take by mouth as needed for diarrhea or loose stools.    [provider]  Melatonin 5 MG CAPS Take 1 tablet by mouth at bedtime as needed for sleep.  [provider]  metFORMIN  (GLUCOPHAGE ) 500 MG tablet Take 1 tablet by mouth 3 (three) times daily. 06/12/21   [provider]  metoprolol  (LOPRESSOR ) 100 MG tablet Take 100 mg by mouth daily.     [provider]  mupirocin ointment (BACTROBAN) 2 % Apply 1 Application topically 2 (two) times daily. 02/20/24   Corlis Burnard DEL, NP  nitrofurantoin (MACRODANTIN) 50 MG capsule Take 50 mg by mouth daily.    [provider]  nystatin cream (MYCOSTATIN) Apply to affected area 2 times daily 02/20/24   Corlis Burnard DEL, NP  ondansetron  (ZOFRAN -ODT) 4 MG disintegrating tablet Take 1 tablet (4 mg total) by mouth every 8 (eight) hours as needed for nausea or vomiting. 08/21/23   Suzanne Kirsch, MD  OVER THE COUNTER MEDICATION doTerra for healthy immnune system    [provider]  oxyCODONE  (OXY IR/ROXICODONE ) 5 MG immediate release tablet Take 1 tablet (5 mg total) by mouth every 4 (four) hours as needed for moderate pain (pain score 4-6). 11/07/21   Verlinda Boas, PA-C  pantoprazole  (PROTONIX ) 40 MG tablet Take 1 tablet (40 mg total) by mouth daily. 09/12/22   Patel, Sona, MD  rosuvastatin  (CRESTOR ) 10 MG tablet Take 10 mg by mouth daily.    [provider]  Semaglutide (OZEMPIC, 0.25 OR 0.5 MG/DOSE, [place]) Inject 0.5 mg into the skin every 7 (seven) days.    [provider]  trimethoprim  (TRIMPEX ) 100 MG tablet Take 1 tablet (100 mg total) by mouth daily. 02/27/24   Gaston Hamilton, MD  vitamin B-12 (CYANOCOBALAMIN ) 500 MCG tablet Take 500 mcg by mouth daily.    [provider]     Critical care time: 52 minutes     Deward Eastern, AGACNP-BC [facility]  See Amion for personal pager PCCM on call pager [phone] until 7pm. Please call Elink 7p-7a. [phone]  03/29/2024 10:16 PM

## 2024-03-29 NOTE — Transfer of Care (Signed)
 Immediate Anesthesia Transfer of Care Note  Patient: [name]  Procedure(s) Performed: RADIOLOGY WITH ANESTHESIA  Patient Location: PACU  Anesthesia Type:General  Level of Consciousness: awake and drowsy  Airway & Oxygen Therapy: Patient Spontanous Breathing and Patient connected to face mask oxygen  Post-op Assessment: Report given to RN and Post -op Vital signs reviewed and stable  Post vital signs: Reviewed and stable  Last Vitals:  Vitals Value Taken Time  BP 85/65 03/29/24 15:24  Temp 98   Pulse 126 03/29/24 15:28  Resp 14 03/29/24 15:28  SpO2 95 % 03/29/24 15:28  Vitals shown include unfiled device data.  Last Pain: There were no vitals filed for this visit.       Complications: No notable events documented.

## 2024-03-29 NOTE — Brief Op Note (Signed)
  NEUROSURGERY BRIEF THROMBECTOMY NOTE   PREOP DX: Stroke  POSTOP DX: Same  PROCEDURE: LMCA Thrombectomy  SURGEON: Dino Sable   ANESTHESIA: GETA  EBL: Minimal  Number of Passes: 1  Technique: ASPIRATION WITH STENT RETRIEVER  Final TICI score: 3  Post OP blood pressure goal: SBP<160  Arterial Angioplasty or Stent: No   Anti-Platelet Therapy: No   COMPLICATIONS: No   CONDITION: Stable to recovery  FINDINGS (Full report in [facility]): 1. LMCA thrombus. LMA thrombectomy with stent retriever. TICI III   Enaya Howze  @today @ 3:12 PM

## 2024-03-29 NOTE — Anesthesia Preprocedure Evaluation (Signed)
 Anesthesia Evaluation  Patient identified by MRN, date of birth, ID band Patient confused    Reviewed: Allergy & Precautions, NPO status , Patient's Chart, lab work & pertinent test results  History of Anesthesia Complications (+) PONV and history of anesthetic complications  Airway Mallampati: Unable to assess       Dental  (+) Teeth Intact, Dental Advisory Given   Pulmonary sleep apnea    breath sounds clear to auscultation       Cardiovascular hypertension, + Peripheral Vascular Disease   Rhythm:Irregular Rate:Tachycardia     Neuro/Psych   Anxiety     CVA    GI/Hepatic PUD,,,  Endo/Other  diabetes    Renal/GU Renal disease     Musculoskeletal  (+) Arthritis ,    Abdominal   Peds  Hematology   Anesthesia Other Findings   Reproductive/Obstetrics                              Anesthesia Physical Anesthesia Plan  ASA: 5 and emergent  Anesthesia Plan: General   Post-op Pain Management:    Induction: Intravenous  PONV Risk Score and Plan: 2 and Ondansetron , Dexamethasone  and Treatment may vary due to age or medical condition  Airway Management Planned: Oral ETT  Additional Equipment: Arterial line  Intra-op Plan:   Post-operative Plan: Possible Post-op intubation/ventilation  Informed Consent:      Only emergency history available  Plan Discussed with: CRNA  Anesthesia Plan Comments:         Anesthesia Quick Evaluation

## 2024-03-29 NOTE — Progress Notes (Signed)
 PHARMACIST CODE STROKE RESPONSE  Notified to mix TNK at 1:22 by Dr. Rosemarie TNK preparation completed at 1:25  TNK dose = 21 mg IV over 5 seconds.   Issues/delays encountered (if applicable):  TNK not administered until 1:48 Delayed due to getting in contact with family for consent.  Instructed to mix TNK awaiting family arrival  [name] 03/29/24 1:29 PM

## 2024-03-29 NOTE — Consult Note (Signed)
 Hospital Consult    Reason for Consult: Ischemic right leg after right common femoral access for percutaneous thrombectomy of left MCA thrombus  Referring Physician:  Stroke team MRN #:  [id number]  History of Present Illness: This is a 78 y.o. female with history of diabetes, hypertension, CKD that presented as a code stroke and went to IR for left MCA thrombectomy and vascular surgery was consulted for right lower extremity ischemia in PACU.  Right transfemoral access was used with 8 French access and percutaneous closure by neuro IR.  Noted to have Doppler signals in the right foot prior to the procedure and now no Doppler signals in the right foot after right groin access.  Past Medical History:  Diagnosis Date   A-fib (HCC)    back in 2013, for stent placement in kidney, she had a bout of a-fib.  Now, back in rhythm   Arthritis    Chronic kidney disease    Complication of anesthesia    Diabetes mellitus without complication (HCC)    type 2    only takes metformin    Dyspnea    Dysuria    History of kidney stones    Hypertension    Osteopenia    PONV (postoperative nausea and vomiting)    Sleep apnea    lost over 120 lbs, and no long uses it (close to 2 yrs now)   Stroke [facility])    mini 2003--affected right side of body.   took 6 weeks to get over.   Urine, incontinence, stress female     Past Surgical History:  Procedure Laterality Date   BREAST BIOPSY Right 11/22/2019   U/S Bx-HYDROMARK-BENIGN INTRAMAMMARY LYMPH NODE.   COLONOSCOPY W/ POLYPECTOMY Right    COLONOSCOPY WITH PROPOFOL  N/A 01/09/2015   Procedure: COLONOSCOPY WITH PROPOFOL ;  Surgeon: Lamar ONEIDA Holmes, MD;  Location: [facility] ENDOSCOPY;  Service: Endoscopy;  Laterality: N/A;   ERCP N/A 03/15/2019   Procedure: ENDOSCOPIC RETROGRADE CHOLANGIOPANCREATOGRAPHY (ERCP);  Surgeon: Jinny Carmine, MD;  Location: [facility] ENDOSCOPY;  Service: Endoscopy;  Laterality: N/A;   EYE SURGERY  2013   bilateral cataracts with implants    JOINT REPLACEMENT     left knee  TKR   KIDNEY STONE SURGERY     40 yrs ago, while pregnant, she had to 'be cut in 1/2 to get stone out'   KNEE ARTHROPLASTY Right 11/06/2021   Procedure: COMPUTER ASSISTED TOTAL KNEE ARTHROPLASTY - DEPUY;  Surgeon: Mardee Lynwood SQUIBB, MD;  Location: [facility] ORS;  Service: Orthopedics;  Laterality: Right;   KNEE ARTHROSCOPY     left knee   LITHOTRIPSY     ltk Left    LUMBAR LAMINECTOMY/DECOMPRESSION MICRODISCECTOMY N/A 09/06/2016   Procedure: LAMINECTOMY AND FORAMINOTOMY LUMBAR THREE- LUMBAR FOUR, LUMBAR FOUR- LUMBAR FIVE;  Surgeon: Reyes Budge, MD;  Location: [facility] OR;  Service: Neurosurgery;  Laterality: N/A;  LAMINECTOMY AND FORAMINOTOMY LUMBAR 3- LUMBAR 4, LUMBAR 4- LUMBAR 5    REVERSE SHOULDER ARTHROPLASTY Right 12/08/2020   Procedure: Right reverse total shoulder arthroplasty and  biceps tenodesis;  Surgeon: Tobie Priest, MD;  Location: [facility] ORS;  Service: Orthopedics;  Laterality: Right;   throidectomy  1979   goiter   TRIGGER FINGER RELEASE Left     Allergies  Allergen Reactions   Prednisone Other (See Comments)   Lactose Intolerance (Gi)     Milk    Morphine  And Codeine Nausea And Vomiting   Vicodin [Hydrocodone-Acetaminophen ] Nausea And Vomiting    Tolerates acetaminophen     Prior  to Admission medications   Medication Sig Start Date End Date Taking? Authorizing Provider  fluconazole (DIFLUCAN) 150 MG tablet Take 150 mg by mouth once. 03/19/24  Yes [provider]  losartan (COZAAR) 50 MG tablet Take 50 mg by mouth daily. 01/10/24 01/09/25 Yes [provider]  acetaminophen  (TYLENOL ) 500 MG tablet Take 500 mg by mouth every 6 (six) hours as needed.    [provider]  allopurinol  (ZYLOPRIM ) 100 MG tablet Take 100 mg by mouth daily.    [provider]  ALPRAZolam  (XANAX ) 0.5 MG tablet Take 0.5 mg by mouth 2 (two) times daily as needed for anxiety.    [provider]  amLODipine  (NORVASC ) 10 MG tablet Take 10  mg by mouth daily.  07/05/16   [provider]  aspirin  EC 81 MG tablet Take 81 mg by mouth daily. Swallow whole.    [provider]  Chlorpheniramine-DM 4-30 MG TABS Take by mouth.    [provider]  Cholecalciferol  (VITAMIN D3) 50 MCG (2000 UT) TABS Take 2,000 Units by mouth daily.    [provider]  furosemide  (LASIX ) 20 MG tablet Take 20 mg by mouth daily as needed for edema.    [provider]  gabapentin  (NEURONTIN ) 100 MG capsule Take 100 mg by mouth 2 (two) times daily. 08/22/18   [provider]  glipiZIDE  (GLUCOTROL ) 5 MG tablet Take 5 mg by mouth daily before breakfast.    [provider]  loperamide  (IMODIUM ) 2 MG capsule Take by mouth as needed for diarrhea or loose stools.    [provider]  Melatonin 5 MG CAPS Take 1 tablet by mouth at bedtime as needed for sleep.    [provider]  metFORMIN  (GLUCOPHAGE ) 500 MG tablet Take 1 tablet by mouth 3 (three) times daily. 06/12/21   [provider]  metoprolol  (LOPRESSOR ) 100 MG tablet Take 100 mg by mouth daily.     [provider]  mupirocin ointment (BACTROBAN) 2 % Apply 1 Application topically 2 (two) times daily. 02/20/24   Corlis Burnard DEL, NP  nitrofurantoin (MACRODANTIN) 50 MG capsule Take 50 mg by mouth daily.    [provider]  nystatin cream (MYCOSTATIN) Apply to affected area 2 times daily 02/20/24   Corlis Burnard DEL, NP  ondansetron  (ZOFRAN -ODT) 4 MG disintegrating tablet Take 1 tablet (4 mg total) by mouth every 8 (eight) hours as needed for nausea or vomiting. 08/21/23   Suzanne Kirsch, MD  OVER THE COUNTER MEDICATION doTerra for healthy immnune system    [provider]  oxyCODONE  (OXY IR/ROXICODONE ) 5 MG immediate release tablet Take 1 tablet (5 mg total) by mouth every 4 (four) hours as needed for moderate pain (pain score 4-6). 11/07/21   Verlinda Boas, PA-C  pantoprazole  (PROTONIX ) 40 MG tablet Take 1 tablet (40 mg  total) by mouth daily. 09/12/22   Patel, Sona, MD  rosuvastatin  (CRESTOR ) 10 MG tablet Take 10 mg by mouth daily.    [provider]  Semaglutide (OZEMPIC, 0.25 OR 0.5 MG/DOSE, [place]) Inject 0.5 mg into the skin every 7 (seven) days.    [provider]  trimethoprim  (TRIMPEX ) 100 MG tablet Take 1 tablet (100 mg total) by mouth daily. 02/27/24   Gaston Hamilton, MD  vitamin B-12 (CYANOCOBALAMIN ) 500 MCG tablet Take 500 mcg by mouth daily.    [provider]    Social History   Socioeconomic History   Marital status: Married    Spouse name:  Not on file   Number of children: Not on file   Years of education: Not on file   Highest education level: Not on file  Occupational History   Not on file  Tobacco Use   Smoking status: Never   Smokeless tobacco: Never  Vaping Use   Vaping status: Never Used  Substance and Sexual Activity   Alcohol use: No   Drug use: Yes    Comment: prescribed oxycodone    Sexual activity: Not on file  Other Topics Concern   Not on file  Social History Narrative   Not on file   Social Drivers of Health   Financial Resource Strain: Low Risk  (08/25/2023)   Received from [facility]   Overall Financial Resource Strain (CARDIA)    Difficulty of Paying Living Expenses: Not hard at all  Food Insecurity: No Food Insecurity (08/25/2023)   Received from [facility]   Hunger Vital Sign    Within the past 12 months, you worried that your food would run out before you got the money to buy more.: Never true    Within the past 12 months, the food you bought just didn't last and you didn't have money to get more.: Never true  Transportation Needs: No Transportation Needs (08/25/2023)   Received from [facility] - Transportation    In the past 12 months, has lack of transportation kept you from medical appointments or from getting medications?: No    Lack of Transportation  (Non-Medical): No  Physical Activity: Not on file  Stress: Not on file  Social Connections: Not on file  Intimate Partner Violence: Not At Risk (09/11/2022)   Humiliation, Afraid, Rape, and Kick questionnaire    Fear of Current or Ex-Partner: No    Emotionally Abused: No    Physically Abused: No    Sexually Abused: No     Family History  Problem Relation Age of Onset   Breast cancer Neg Hx     ROS: [x]  Positive   [ ]  Negative   [ ]  All sytems reviewed and are negative  Cardiovascular: []  chest pain/pressure []  palpitations []  SOB lying flat []  DOE []  pain in legs while walking []  pain in legs at rest []  pain in legs at night []  non-healing ulcers []  hx of DVT []  swelling in legs  Pulmonary: []  productive cough []  asthma/wheezing []  home O2  Neurologic: []  weakness in []  arms []  legs []  numbness in []  arms []  legs []  hx of CVA []  mini stroke [] difficulty speaking or slurred speech []  temporary loss of vision in one eye []  dizziness  Hematologic: []  hx of cancer []  bleeding problems []  problems with blood clotting easily  Endocrine:   []  diabetes []  thyroid  disease  GI []  vomiting blood []  blood in stool  GU: []  CKD/renal failure []  HD--[]  M/W/F or []  T/T/S []  burning with urination []  blood in urine  Psychiatric: []  anxiety []  depression  Musculoskeletal: []  arthritis []  joint pain  Integumentary: []  rashes []  ulcers  Constitutional: []  fever []  chills   Physical Examination  Vitals:   03/29/24 1615 03/29/24 1630  BP: (!) 90/49 114/80  Pulse: (!) 121 (!) 119  Resp: 19 19  Temp:    SpO2: 96% 98%   Body mass index is 32.49 kg/m.  General:  WDWN in NAD Gait: Not observed HENT: WNL, normocephalic Pulmonary: normal non-labored breathing Cardiac: regular, without  Murmurs, rubs or  gallops Abdomen:  soft, NT/ND Vascular Exam/Pulses: Left femoral pulse palpable Left DP palpable Right groin hematoma No signals in the right  foot Neuro:  Right sided weakness  CBC    Component Value Date/Time   WBC 8.9 03/29/2024 1311   RBC 5.01 03/29/2024 1311   HGB 17.0 (H) 03/29/2024 1312   HGB 12.9 08/13/2013 1255   HCT 50.0 (H) 03/29/2024 1312   HCT 39.6 08/13/2013 1255   PLT 228 03/29/2024 1311   PLT 390 08/13/2013 1255   MCV 94.2 03/29/2024 1311   MCV 89 08/13/2013 1255   MCH 31.7 03/29/2024 1311   MCHC 33.7 03/29/2024 1311   RDW 14.1 03/29/2024 1311   RDW 14.2 08/13/2013 1255   LYMPHSABS 2.2 03/29/2024 1311   LYMPHSABS 1.8 08/13/2013 1255   MONOABS 0.7 03/29/2024 1311   MONOABS 0.7 08/13/2013 1255   EOSABS 0.4 03/29/2024 1311   EOSABS 0.2 08/13/2013 1255   BASOSABS 0.1 03/29/2024 1311   BASOSABS 0.1 08/13/2013 1255    BMET    Component Value Date/Time   NA 137 03/29/2024 1312   NA 139 08/13/2013 1255   K 4.6 03/29/2024 1312   K 4.1 08/13/2013 1255   CL 103 03/29/2024 1312   CL 104 08/13/2013 1255   CO2 20 (L) 03/29/2024 1311   CO2 30 08/13/2013 1255   GLUCOSE 156 (H) 03/29/2024 1312   GLUCOSE 147 (H) 08/13/2013 1255   BUN 28 (H) 03/29/2024 1312   BUN 14 08/13/2013 1255   CREATININE 2.10 (H) 03/29/2024 1312   CREATININE 1.62 (H) 08/13/2013 1255   CALCIUM  8.9 03/29/2024 1311   CALCIUM  9.1 08/13/2013 1255   GFRNONAA 26 (L) 03/29/2024 1311   GFRNONAA 32 (L) 08/13/2013 1255   GFRAA 42 (L) 01/24/2019 1137   GFRAA 37 (L) 08/13/2013 1255    COAGS: Lab Results  Component Value Date   INR 1.0 09/10/2022     Non-Invasive Vascular Imaging:    N/A   ASSESSMENT/PLAN: This is a 78 y.o. female with history of diabetes, hypertension, CKD that presented as a code stroke and went to IR for left MCA thrombectomy and vascular surgery was consulted for right lower extremity ischemia.  Right transfemoral access was used with 8 French access and closure with neuro IR.  Noted to have Doppler signals in the right foot prior to the procedure and now no Doppler signals in the right foot.  Patient cannot  provide any reliable history at bedside following her stroke presentation.  Unable to get any Doppler signals in the right foot.  She does have a hematoma in the right groin.  Have recommended emergent right groin exploration and likely thrombectomy of the right lower extremity and exploration of the common femoral where the closure was used.  Lonni DOROTHA Gaskins, MD [facility] Office: [phone]  Lonni JINNY Gaskins

## 2024-03-29 NOTE — H&P (Signed)
 NEUROLOGY H&P NOTE   Date of service: March 29, 2024 Patient Name: [name] MRN:  [id number] DOB:  1945/08/13 Chief Complaint: CODE STROKE  History of Present Illness  [name] is a 78 y.o. female with hx of Afib (not on OAC), HTN, DM2,CKD, CKD, OSA who was BIB EMS as a CODE STROKE due to acute onset of aphasia and right-sided weakness.   On exam at bridge, disoriented, right facial droop, right arm weakness, right leg weakness, right decreased sensation, expressive aphasia , and dysarthria.  CTH negative for hemorrhage. CTA shows acute LVO of left MCA.   Imaging reviewed with Dr. Matthews, Dr. Rosemarie and Dr. Janjua. Assessment and imaging results reviewed with son, [name] in ED Resusc room along with patient's husband [name] over Speakerphone. Management with thrombolytic therapy was explained to the patient, or patient's representative, as were risks, benefits and alternatives. All questions were answered. Patient, or patient's representative, [name], benefits and alternatives of EVT discussed with patient and/or family and they agreed. Patient transported to IR emergently and TNK was given as patient arrived in IR suite @ 1348.   Last known well: 1210 Modified rankin score: 0-Completely asymptomatic and back to baseline post- stroke IV Thrombolysis: Yes @ 1348 Thrombectomy: Yes taken for emergent thrombectomy of Left MCA thrombus  NIHSS components Score: Comment  1a Level of Conscious 0[x]  1[]  2[]  3[]      1b LOC Questions 0[]  1[x]  2[]       1c LOC Commands 0[x]  1[]  2[]       2 Best Gaze 0[x]  1[]  2[]       3 Visual 0[x]  1[]  2[]  3[]      4 Facial Palsy 0[]  1[]  2[x]  3[]      5a Motor Arm - left 0[x]  1[]  2[]  3[]  4[]  UN[]    5b Motor Arm - Right 0[]  1[]  2[]  3[]  4[x]  UN[]    6a Motor Leg - Left 0[x]  1[]  2[]  3[]  4[]  UN[]    6b Motor Leg - Right 0[]  1[]  2[]  3[]  4[]  UN[]    7 Limb  Ataxia 0[x]  1[]  2[]  UN[]      8 Sensory 0[]  1[]  2[x]  UN[]      9 Best Language 0[]  1[]  2[x]  3[]      10 Dysarthria 0[]  1[]  2[x]  UN[]      11 Extinct. and Inattention 0[x]  1[]  2[]       TOTAL:   17      ROS   Unable to perform due to AMS  Past History   Past Medical History:  Diagnosis Date   A-fib (HCC)    back in 2013, for stent placement in kidney, she had a bout of a-fib.  Now, back in rhythm   Arthritis    Chronic kidney disease    Complication of anesthesia    Diabetes mellitus without complication (HCC)    type 2    only takes metformin    Dyspnea    Dysuria    History of kidney stones    Hypertension    Osteopenia    PONV (postoperative nausea and vomiting)    Sleep apnea    lost over 120 lbs, and no long uses it (close to 2 yrs now)   Stroke [facility])    mini 2003--affected right side of body.   took 6 weeks to get over.   Urine, incontinence, stress female    Past Surgical History:  Procedure Laterality Date   BREAST BIOPSY Right 11/22/2019  U/S Bx-HYDROMARK-BENIGN INTRAMAMMARY LYMPH NODE.   COLONOSCOPY W/ POLYPECTOMY Right    COLONOSCOPY WITH PROPOFOL  N/A 01/09/2015   Procedure: COLONOSCOPY WITH PROPOFOL ;  Surgeon: Lamar ONEIDA Holmes, MD;  Location: [facility] ENDOSCOPY;  Service: Endoscopy;  Laterality: N/A;   ERCP N/A 03/15/2019   Procedure: ENDOSCOPIC RETROGRADE CHOLANGIOPANCREATOGRAPHY (ERCP);  Surgeon: Jinny Carmine, MD;  Location: [facility] ENDOSCOPY;  Service: Endoscopy;  Laterality: N/A;   EYE SURGERY  2013   bilateral cataracts with implants   JOINT REPLACEMENT     left knee  TKR   KIDNEY STONE SURGERY     40 yrs ago, while pregnant, she had to 'be cut in 1/2 to get stone out'   KNEE ARTHROPLASTY Right 11/06/2021   Procedure: COMPUTER ASSISTED TOTAL KNEE ARTHROPLASTY - DEPUY;  Surgeon: Mardee Lynwood SQUIBB, MD;  Location: [facility] ORS;  Service: Orthopedics;  Laterality: Right;   KNEE ARTHROSCOPY     left knee   LITHOTRIPSY     ltk Left    LUMBAR LAMINECTOMY/DECOMPRESSION  MICRODISCECTOMY N/A 09/06/2016   Procedure: LAMINECTOMY AND FORAMINOTOMY LUMBAR THREE- LUMBAR FOUR, LUMBAR FOUR- LUMBAR FIVE;  Surgeon: Reyes Budge, MD;  Location: [facility] OR;  Service: Neurosurgery;  Laterality: N/A;  LAMINECTOMY AND FORAMINOTOMY LUMBAR 3- LUMBAR 4, LUMBAR 4- LUMBAR 5    REVERSE SHOULDER ARTHROPLASTY Right 12/08/2020   Procedure: Right reverse total shoulder arthroplasty and  biceps tenodesis;  Surgeon: Tobie Priest, MD;  Location: [facility] ORS;  Service: Orthopedics;  Laterality: Right;   throidectomy  1979   goiter   TRIGGER FINGER RELEASE Left    Family History  Problem Relation Age of Onset   Breast cancer Neg Hx    Social History   Socioeconomic History   Marital status: Married    Spouse name: Not on file   Number of children: Not on file   Years of education: Not on file   Highest education level: Not on file  Occupational History   Not on file  Tobacco Use   Smoking status: Never   Smokeless tobacco: Never  Vaping Use   Vaping status: Never Used  Substance and Sexual Activity   Alcohol  use: No   Drug use: Yes    Comment: prescribed oxycodone    Sexual activity: Not on file  Other Topics Concern   Not on file  Social History Narrative   Not on file   Social Drivers of Health   Financial Resource Strain: Low Risk  (08/25/2023)   Received from [facility]   Overall Financial Resource Strain (CARDIA)    Difficulty of Paying Living Expenses: Not hard at all  Food Insecurity: No Food Insecurity (08/25/2023)   Received from [facility]   Hunger Vital Sign    Within the past 12 months, you worried that your food would run out before you got the money to buy more.: Never true    Within the past 12 months, the food you bought just didn't last and you didn't have money to get more.: Never true  Transportation Needs: No Transportation Needs (08/25/2023)   Received from [facility] - Transportation     In the past 12 months, has lack of transportation kept you from medical appointments or from getting medications?: No    Lack of Transportation (Non-Medical): No  Physical Activity: Not on file  Stress: Not on file  Social Connections: Not on file   Allergies  Allergen Reactions   Prednisone Other (See Comments)  Lactose Intolerance (Gi)     Milk    Morphine  And Codeine Nausea And Vomiting   Vicodin [Hydrocodone-Acetaminophen ] Nausea And Vomiting    Tolerates acetaminophen     Medications   Current Facility-Administered Medications:    [START ON 03/30/2024]  stroke: early stages of recovery book, , Does not apply, Once, Judithe, Erin C, NP   0.9 %  sodium chloride  infusion, , Intravenous, Continuous, Lehner, Erin C, NP   acetaminophen  (TYLENOL ) tablet 650 mg, 650 mg, Oral, Q4H PRN **OR** acetaminophen  (TYLENOL ) 160 MG/5ML solution 650 mg, 650 mg, Per Tube, Q4H PRN **OR** acetaminophen  (TYLENOL ) suppository 650 mg, 650 mg, Rectal, Q4H PRN, Lehner, Erin C, NP   labetalol  (NORMODYNE ) injection 20 mg, 20 mg, Intravenous, Once **AND** clevidipine  (CLEVIPREX ) infusion 0.5 mg/mL, 0-21 mg/hr, Intravenous, Continuous, Lehner, Erin C, NP   clevidipine  (CLEVIPREX ) infusion 0.5 mg/mL, 0-21 mg/hr, Intravenous, Continuous, Janjua, Dino HERO, MD   senna-docusate (Senokot-S) tablet 1 tablet, 1 tablet, Oral, QHS PRN, Lehner, Erin C, NP   sodium chloride  0.9 % bolus 250 mL, 250 mL, Intravenous, PRN, Janjua, Rashid M, MD   sodium chloride  flush (NS) 0.9 % injection 3 mL, 3 mL, Intravenous, Once, Tegeler, Lonni PARAS, MD  Current Outpatient Medications:    fluconazole (DIFLUCAN) 150 MG tablet, Take 150 mg by mouth once., Disp: , Rfl:    losartan (COZAAR) 50 MG tablet, Take 50 mg by mouth daily., Disp: , Rfl:    acetaminophen  (TYLENOL ) 500 MG tablet, Take 500 mg by mouth every 6 (six) hours as needed., Disp: , Rfl:    allopurinol  (ZYLOPRIM ) 100 MG tablet, Take 100 mg by mouth daily., Disp: , Rfl:     ALPRAZolam  (XANAX ) 0.5 MG tablet, Take 0.5 mg by mouth 2 (two) times daily as needed for anxiety., Disp: , Rfl:    amLODipine  (NORVASC ) 10 MG tablet, Take 10 mg by mouth daily. , Disp: , Rfl:    aspirin  EC 81 MG tablet, Take 81 mg by mouth daily. Swallow whole., Disp: , Rfl:    Chlorpheniramine-DM 4-30 MG TABS, Take by mouth., Disp: , Rfl:    Cholecalciferol  (VITAMIN D3) 50 MCG (2000 UT) TABS, Take 2,000 Units by mouth daily., Disp: , Rfl:    furosemide  (LASIX ) 20 MG tablet, Take 20 mg by mouth daily as needed for edema., Disp: , Rfl:    gabapentin  (NEURONTIN ) 100 MG capsule, Take 100 mg by mouth 2 (two) times daily., Disp: , Rfl:    glipiZIDE  (GLUCOTROL ) 5 MG tablet, Take 5 mg by mouth daily before breakfast., Disp: , Rfl:    loperamide  (IMODIUM ) 2 MG capsule, Take by mouth as needed for diarrhea or loose stools., Disp: , Rfl:    Melatonin 5 MG CAPS, Take 1 tablet by mouth at bedtime as needed for sleep., Disp: , Rfl:    metFORMIN  (GLUCOPHAGE ) 500 MG tablet, Take 1 tablet by mouth 3 (three) times daily., Disp: , Rfl:    metoprolol  (LOPRESSOR ) 100 MG tablet, Take 100 mg by mouth daily. , Disp: , Rfl:    mupirocin  ointment (BACTROBAN ) 2 %, Apply 1 Application topically 2 (two) times daily., Disp: 22 g, Rfl: 0   nitrofurantoin (MACRODANTIN) 50 MG capsule, Take 50 mg by mouth daily., Disp: , Rfl:    nystatin  cream (MYCOSTATIN ), Apply to affected area 2 times daily, Disp: 30 g, Rfl: 2   ondansetron  (ZOFRAN -ODT) 4 MG disintegrating tablet, Take 1 tablet (4 mg total) by mouth every 8 (eight) hours as needed for nausea or  vomiting., Disp: 30 tablet, Rfl: 0   OVER THE COUNTER MEDICATION, doTerra for healthy immnune system, Disp: , Rfl:    oxyCODONE  (OXY IR/ROXICODONE ) 5 MG immediate release tablet, Take 1 tablet (5 mg total) by mouth every 4 (four) hours as needed for moderate pain (pain score 4-6)., Disp: 30 tablet, Rfl: 0   pantoprazole  (PROTONIX ) 40 MG tablet, Take 1 tablet (40 mg total) by mouth  daily., Disp: 30 tablet, Rfl: 0   rosuvastatin  (CRESTOR ) 10 MG tablet, Take 10 mg by mouth daily., Disp: , Rfl:    Semaglutide (OZEMPIC, 0.25 OR 0.5 MG/DOSE, [place]), Inject 0.5 mg into the skin every 7 (seven) days., Disp: , Rfl:    trimethoprim  (TRIMPEX ) 100 MG tablet, Take 1 tablet (100 mg total) by mouth daily., Disp: 30 tablet, Rfl: 11   vitamin B-12 (CYANOCOBALAMIN ) 500 MCG tablet, Take 500 mcg by mouth daily., Disp: , Rfl:    Vitals   Vitals:   03/29/24 1523 03/29/24 1530 03/29/24 1545 03/29/24 1600  BP: (!) 85/65 101/68 99/69 (!) 73/52  Pulse: (!) 116 (!) 118 (!) 115 (!) 121  Resp: (!) 22 (!) 24 20 18   Temp: (!) 97.4 F (36.3 C)     SpO2: 95% 95% 94% 93%  Weight:         Body mass index is 32.49 kg/m.  Physical Exam   Constitutional: Appears well-developed and well-nourished.  Cardiovascular: Normal rate and regular rhythm. Pitting edema R foot, decreased DP pulse. Respiratory: Effort normal, non-labored breathing.   Neurologic Examination   Neuro: Mental Status: Patient is confused to month.  Global aphasia present Cranial Nerves: II: Visual Fields are full. Pupils are equal, round, and reactive to light.   III,IV, VI: EOMI without ptosis or diploplia.  V: Facial sensation is symmetric to light touch VII: Right facial droop  VIII: hearing is intact to voice X: Moderate dysarthria.  XI: Shoulder shrug is decreased on left.  XII: tongue is midline without atrophy or fasciculations.  Motor: Tone is normal. Bulk is normal.  Dense right hemiplegia LUE/LLE: 5/5, no drifts Sensory: Sensation is decreased to right arm and leg Cerebellar: Unable to perform FNF on right.    Labs   CBC:  Recent Labs  Lab 03/29/24 1311 03/29/24 1312  WBC 8.9  --   NEUTROABS 5.6  --   HGB 15.9* 17.0*  HCT 47.2* 50.0*  MCV 94.2  --   PLT 228  --    Basic Metabolic Panel:  Lab Results  Component Value Date   NA 137 03/29/2024   K 4.6 03/29/2024   CO2 20 (L) 03/29/2024    GLUCOSE 156 (H) 03/29/2024   BUN 28 (H) 03/29/2024   CREATININE 2.10 (H) 03/29/2024   CALCIUM  8.9 03/29/2024   GFRNONAA 26 (L) 03/29/2024   GFRAA 42 (L) 01/24/2019   Lipid Panel: No results found for: LDLCALC HgbA1c:  Lab Results  Component Value Date   HGBA1C 6.7 (H) 10/23/2021   Urine Drug Screen: No results found for: LABOPIA, COCAINSCRNUR, LABBENZ, AMPHETMU, THCU, LABBARB  Alcohol Level     Component Value Date/Time   [facility] <15 03/29/2024 1311   INR  Lab Results  Component Value Date   INR 1.0 09/10/2022   APTT  Lab Results  Component Value Date   APTT 20 (L) 09/10/2022   CT Head without contrast(Personally reviewed): No acute intracranial hemorrhage. Heterogeneous basal ganglia with multiple bilateral remote lacunar infarcts as above. Small acute lacunar infarct difficult to exclude.  Mild age-related parenchymal volume loss. Moderate chronic microvascular ischemic changes. Remote infarct in the posterior left cerebellum. ASPECTS 10.  CT angio Head and Neck with contrast(Personally reviewed): Acute large vessel occlusion of the left middle cerebral artery at the mid M1 segment. Possible intraluminal thrombus in the M1 segment. Mild to moderate collateralization of distal MCA branches. 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 Right vertebral artery occlusion with distal reconstitution and severe stenosis, favored chronic. Mild stenosis of the right cavernous and supraclinoid ICA and moderate stenosis of the left cavernous and supraclinoid ICA. Approximately 50% stenosis at the origin of the right internal carotid artery. Multilevel degenerative changes of the cervical spine as above  MRI Brain(Personally reviewed): Ordered for tonight  Assessment    78 y.o. female with hx of Afib (not on OAC), HTN, DM2,CKD, CKD, OSA who was BIB EMS as a CODE STROKE due to acute onset of aphasia and right-sided weakness.   NIH 17 on initial  assessment at bridge due to confusion, right facial droop, global aphasia, dense right hemiplegia, decreased right sensation. CTH negative. CTA shows Left MCA occlusion. Assessment, imaging, risks/benefits of TNK and EVT reviewed thoroughly with patient's husband (over phone) and son (at bedside), consented to both TNK and emergent thrombectomy.   Primary Diagnosis:  Acute Left MCA stroke due to Left MCA LVO S/p Mechanical Thrombectomy of Left MCA thrombus with TICI 3 revascularization  Secondary Diagnosis: Atrial Fibrillation, not on OAC HTN DM2 CKD OSA   Recommendations   - CCM consult if remains intubated post-procedure - History of Afib, not on OAC  EKG Possible need for Cardiology Consult - Frequent Neuro checks per stroke unit protocol - MRI Brain stroke protocol - TTE  - Lipid panel - Statin - will be started if LDL>70 or otherwise medically indicated - A1C - Antithrombotic - HOLD until 24hour post-TNK imaging is stable with no hemorrhage - DVT ppx - SCDs - BP goal - <180/105, for post-TNK, per IR post-procedure, < 160 PRN labetalol if HR>60 and PRN Hydralazine  if HR<60 - Telemetry monitoring for arrhythmia  - Swallow screen - will be performed prior to PO intake - Stroke education - will be given - PT/OT/SLP - Dispo: Admit to ICU   ______________________________________________________________________   Signed, Rocky JAYSON Likes, NP [facility]  Risks, benefits and alternatives of IVT discussed with patient and/or family and they agreed. CTH personally reviewed prior to TNK administration    Attending Neurohospitalist Addendum Patient seen and examined with APP/Resident. Agree with the history and physical as documented above. Agree with the plan as documented, which I helped formulate. I have edited the note above to reflect my full findings and recommendations. I have independently reviewed the chart, obtained history, review of systems and  examined the patient.I have personally reviewed pertinent head/neck/spine imaging (CT/MRI). Please feel free to call with any questions.  This patient is critically ill and at significant risk of neurological worsening, death and care requires constant monitoring of vital signs, hemodynamics,respiratory and cardiac monitoring, neurological assessment, discussion with family, other specialists and medical decision making of high complexity. I spent 105 minutes of neurocritical care time  in the care of  this patient. This was time spent independent of any time provided by nurse practitioner or PA.  Elida Ross, MD [facility] [phone]  If 7pm- 7am, please page neurology on call as listed in AMION.   Addendum 1710:  I received a phone call from Kelsey Likes NP  and [name] stroke response RN at 1710. They notified me that patient had developed RLE edema and acute loss of pulses in R foot in PACU and had been taken emergently to OR by vascular surgery for critical limb ischemia. I called CCM who will see her after surgery in formal consultation for ventilator management and assistance with comorbidities and complications described above.  Elida Ross, MD [facility] [phone]  If 7pm- 7am, please page neurology on call as listed in AMION.

## 2024-03-29 NOTE — Sedation Documentation (Addendum)
 Patient transported to recovery area via stretcher.

## 2024-03-29 NOTE — ED Triage Notes (Signed)
 Pt BIB [facility] from home for R sided weakness and aphasia. LKW 1210. Pt in a-fib, no thinner per pt.

## 2024-03-30 ENCOUNTER — Encounter (HOSPITAL_COMMUNITY): Payer: Self-pay | Admitting: Vascular Surgery

## 2024-03-30 ENCOUNTER — Telehealth (HOSPITAL_COMMUNITY): Payer: Self-pay | Admitting: Pharmacy Technician

## 2024-03-30 ENCOUNTER — Inpatient Hospital Stay (HOSPITAL_COMMUNITY)

## 2024-03-30 ENCOUNTER — Other Ambulatory Visit (HOSPITAL_COMMUNITY): Payer: Self-pay

## 2024-03-30 DIAGNOSIS — R29723 NIHSS score 23: Secondary | ICD-10-CM

## 2024-03-30 DIAGNOSIS — Z96611 Presence of right artificial shoulder joint: Secondary | ICD-10-CM | POA: Diagnosis not present

## 2024-03-30 DIAGNOSIS — I63412 Cerebral infarction due to embolism of left middle cerebral artery: Secondary | ICD-10-CM

## 2024-03-30 DIAGNOSIS — I6389 Other cerebral infarction: Secondary | ICD-10-CM | POA: Diagnosis not present

## 2024-03-30 DIAGNOSIS — G936 Cerebral edema: Secondary | ICD-10-CM | POA: Diagnosis not present

## 2024-03-30 DIAGNOSIS — I69391 Dysphagia following cerebral infarction: Secondary | ICD-10-CM

## 2024-03-30 DIAGNOSIS — E785 Hyperlipidemia, unspecified: Secondary | ICD-10-CM

## 2024-03-30 DIAGNOSIS — I10 Essential (primary) hypertension: Secondary | ICD-10-CM | POA: Diagnosis not present

## 2024-03-30 DIAGNOSIS — E1151 Type 2 diabetes mellitus with diabetic peripheral angiopathy without gangrene: Secondary | ICD-10-CM

## 2024-03-30 DIAGNOSIS — J9601 Acute respiratory failure with hypoxia: Secondary | ICD-10-CM | POA: Diagnosis not present

## 2024-03-30 DIAGNOSIS — I63512 Cerebral infarction due to unspecified occlusion or stenosis of left middle cerebral artery: Secondary | ICD-10-CM | POA: Diagnosis not present

## 2024-03-30 DIAGNOSIS — I129 Hypertensive chronic kidney disease with stage 1 through stage 4 chronic kidney disease, or unspecified chronic kidney disease: Secondary | ICD-10-CM | POA: Diagnosis not present

## 2024-03-30 DIAGNOSIS — I4891 Unspecified atrial fibrillation: Secondary | ICD-10-CM | POA: Diagnosis not present

## 2024-03-30 DIAGNOSIS — Z7982 Long term (current) use of aspirin: Secondary | ICD-10-CM

## 2024-03-30 DIAGNOSIS — I6350 Cerebral infarction due to unspecified occlusion or stenosis of unspecified cerebral artery: Secondary | ICD-10-CM | POA: Diagnosis not present

## 2024-03-30 DIAGNOSIS — G9340 Encephalopathy, unspecified: Secondary | ICD-10-CM | POA: Diagnosis not present

## 2024-03-30 DIAGNOSIS — Z7984 Long term (current) use of oral hypoglycemic drugs: Secondary | ICD-10-CM

## 2024-03-30 DIAGNOSIS — I639 Cerebral infarction, unspecified: Secondary | ICD-10-CM | POA: Diagnosis not present

## 2024-03-30 LAB — GLUCOSE, CAPILLARY
Glucose-Capillary: 289 mg/dL — ABNORMAL HIGH (ref 70–99)
Glucose-Capillary: 208 mg/dL — ABNORMAL HIGH (ref 70–99)
Glucose-Capillary: 193 mg/dL — ABNORMAL HIGH (ref 70–99)
Glucose-Capillary: 181 mg/dL — ABNORMAL HIGH (ref 70–99)
Glucose-Capillary: 168 mg/dL — ABNORMAL HIGH (ref 70–99)
Glucose-Capillary: 194 mg/dL — ABNORMAL HIGH (ref 70–99)

## 2024-03-30 LAB — HEMOGLOBIN A1C
Hgb A1c MFr Bld: 6.9 % — ABNORMAL HIGH (ref 4.8–5.6)
Mean Plasma Glucose: 151.33 mg/dL

## 2024-03-30 LAB — CBC
HCT: 32.1 % — ABNORMAL LOW (ref 36.0–46.0)
Hemoglobin: 10.5 g/dL — ABNORMAL LOW (ref 12.0–15.0)
MCH: 31.3 pg (ref 26.0–34.0)
MCHC: 32.7 g/dL (ref 30.0–36.0)
MCV: 95.8 fL (ref 80.0–100.0)
Platelets: 194 K/uL (ref 150–400)
RBC: 3.35 MIL/uL — ABNORMAL LOW (ref 3.87–5.11)
RDW: 14.7 % (ref 11.5–15.5)
WBC: 14.9 K/uL — ABNORMAL HIGH (ref 4.0–10.5)
nRBC: 0 % (ref 0.0–0.2)

## 2024-03-30 LAB — COMPREHENSIVE METABOLIC PANEL WITH GFR
ALT: 25 U/L (ref 0–44)
AST: 35 U/L (ref 15–41)
Albumin: 3.1 g/dL — ABNORMAL LOW (ref 3.5–5.0)
Alkaline Phosphatase: 35 U/L — ABNORMAL LOW (ref 38–126)
Anion gap: 13 (ref 5–15)
BUN: 23 mg/dL (ref 8–23)
CO2: 18 mmol/L — ABNORMAL LOW (ref 22–32)
Calcium: 7.7 mg/dL — ABNORMAL LOW (ref 8.9–10.3)
Chloride: 108 mmol/L (ref 98–111)
Creatinine, Ser: 1.81 mg/dL — ABNORMAL HIGH (ref 0.44–1.00)
GFR, Estimated: 28 mL/min — ABNORMAL LOW (ref >60)
Glucose, Bld: 308 mg/dL — ABNORMAL HIGH (ref 70–99)
Potassium: 4.9 mmol/L (ref 3.5–5.1)
Sodium: 139 mmol/L (ref 135–145)
Total Bilirubin: 1.3 mg/dL — ABNORMAL HIGH (ref 0.0–1.2)
Total Protein: 4.9 g/dL — ABNORMAL LOW (ref 6.5–8.1)

## 2024-03-30 LAB — POCT ACTIVATED CLOTTING TIME: Activated Clotting Time: 222 s

## 2024-03-30 LAB — ECHOCARDIOGRAM COMPLETE
Area-P 1/2: 8.25 cm2
S' Lateral: 2.1 cm
Weight: 3075.86 [oz_av]

## 2024-03-30 LAB — POCT I-STAT 7, (LYTES, BLD GAS, ICA,H+H)
Acid-Base Excess: 0 mmol/L (ref 0.0–2.0)
Acid-base deficit: 13 mmol/L — ABNORMAL HIGH (ref 0.0–2.0)
Acid-base deficit: 7 mmol/L — ABNORMAL HIGH (ref 0.0–2.0)
Bicarbonate: 15.3 mmol/L — ABNORMAL LOW (ref 20.0–28.0)
Bicarbonate: 26.4 mmol/L (ref 20.0–28.0)
Bicarbonate: 17.9 mmol/L — ABNORMAL LOW (ref 20.0–28.0)
Calcium, Ion: 1.06 mmol/L — ABNORMAL LOW (ref 1.15–1.40)
Calcium, Ion: 1.14 mmol/L — ABNORMAL LOW (ref 1.15–1.40)
Calcium, Ion: 1.14 mmol/L — ABNORMAL LOW (ref 1.15–1.40)
HCT: 28 % — ABNORMAL LOW (ref 36.0–46.0)
HCT: 30 % — ABNORMAL LOW (ref 36.0–46.0)
HCT: 26 % — ABNORMAL LOW (ref 36.0–46.0)
Hemoglobin: 10.2 g/dL — ABNORMAL LOW (ref 12.0–15.0)
Hemoglobin: 9.5 g/dL — ABNORMAL LOW (ref 12.0–15.0)
Hemoglobin: 8.8 g/dL — ABNORMAL LOW (ref 12.0–15.0)
O2 Saturation: 96 %
O2 Saturation: 98 %
O2 Saturation: 93 %
Patient temperature: 35.2
Patient temperature: 35.5
Potassium: 4.4 mmol/L (ref 3.5–5.1)
Potassium: 4.9 mmol/L (ref 3.5–5.1)
Potassium: 4 mmol/L (ref 3.5–5.1)
Sodium: 137 mmol/L (ref 135–145)
Sodium: 145 mmol/L (ref 135–145)
Sodium: 140 mmol/L (ref 135–145)
TCO2: 17 mmol/L — ABNORMAL LOW (ref 22–32)
TCO2: 28 mmol/L (ref 22–32)
TCO2: 19 mmol/L — ABNORMAL LOW (ref 22–32)
pCO2 arterial: 39.3 mmHg (ref 32–48)
pCO2 arterial: 49.3 mmHg — ABNORMAL HIGH (ref 32–48)
pCO2 arterial: 32.5 mmHg (ref 32–48)
pH, Arterial: 7.188 — CL (ref 7.35–7.45)
pH, Arterial: 7.327 — ABNORMAL LOW (ref 7.35–7.45)
pH, Arterial: 7.349 — ABNORMAL LOW (ref 7.35–7.45)
pO2, Arterial: 98 mmHg (ref 83–108)
pO2, Arterial: 98 mmHg (ref 83–108)
pO2, Arterial: 69 mmHg — ABNORMAL LOW (ref 83–108)

## 2024-03-30 LAB — URINALYSIS, COMPLETE (UACMP) WITH MICROSCOPIC
Bilirubin Urine: NEGATIVE
Glucose, UA: 150 mg/dL — AB
Ketones, ur: NEGATIVE mg/dL
Nitrite: NEGATIVE
Protein, ur: 30 mg/dL — AB
Specific Gravity, Urine: 1.027 (ref 1.005–1.030)
pH: 5 (ref 5.0–8.0)

## 2024-03-30 LAB — PROTIME-INR
INR: 1.2 (ref 0.8–1.2)
Prothrombin Time: 15.9 s — ABNORMAL HIGH (ref 11.4–15.2)

## 2024-03-30 LAB — BLOOD GAS, VENOUS
Acid-base deficit: 6.1 mmol/L — ABNORMAL HIGH (ref 0.0–2.0)
Bicarbonate: 21.1 mmol/L (ref 20.0–28.0)
Drawn by: 1405
O2 Saturation: 41 %
Patient temperature: 37.1
pCO2, Ven: 47 mmHg (ref 44–60)
pH, Ven: 7.26 (ref 7.25–7.43)
pO2, Ven: <31 mmHg — CL (ref 32–45)

## 2024-03-30 LAB — LIPID PANEL
Cholesterol: 123 mg/dL (ref 0–200)
HDL: 28 mg/dL — ABNORMAL LOW (ref >40)
LDL Cholesterol: 83 mg/dL (ref 0–99)
Total CHOL/HDL Ratio: 4.4 ratio
Triglycerides: 61 mg/dL (ref <150)
VLDL: 12 mg/dL (ref 0–40)

## 2024-03-30 LAB — LACTIC ACID, PLASMA
Lactic Acid, Venous: 4 mmol/L (ref 0.5–1.9)
Lactic Acid, Venous: 3.7 mmol/L (ref 0.5–1.9)
Lactic Acid, Venous: 2.9 mmol/L (ref 0.5–1.9)

## 2024-03-30 LAB — PHOSPHORUS
Phosphorus: 4.1 mg/dL (ref 2.5–4.6)
Phosphorus: 3.1 mg/dL (ref 2.5–4.6)

## 2024-03-30 LAB — RAPID URINE DRUG SCREEN, HOSP PERFORMED
Amphetamines: NOT DETECTED
Barbiturates: NOT DETECTED
Benzodiazepines: NOT DETECTED
Cocaine: NOT DETECTED
Opiates: NOT DETECTED
Tetrahydrocannabinol: NOT DETECTED

## 2024-03-30 LAB — MAGNESIUM
Magnesium: 1.7 mg/dL (ref 1.7–2.4)
Magnesium: 2.5 mg/dL — ABNORMAL HIGH (ref 1.7–2.4)

## 2024-03-30 LAB — APTT: aPTT: 28 s (ref 24–36)

## 2024-03-30 LAB — MRSA NEXT GEN BY PCR, NASAL: MRSA by PCR Next Gen: NOT DETECTED

## 2024-03-30 MED ORDER — LACTATED RINGERS IV BOLUS
1000.0000 mL | Freq: Once | INTRAVENOUS | Status: AC
  Administered 2024-03-30: 1000 mL via INTRAVENOUS

## 2024-03-30 MED ORDER — ADULT MULTIVITAMIN W/MINERALS CH
1.0000 | ORAL_TABLET | Freq: Every day | ORAL | Status: DC
  Administered 2024-03-30 – 2024-04-02 (×4): 1
  Filled 2024-03-30 (×4): qty 1

## 2024-03-30 MED ORDER — MAGNESIUM SULFATE 4 GM/100ML IV SOLN
4.0000 g | Freq: Once | INTRAVENOUS | Status: DC
  Filled 2024-03-30: qty 100

## 2024-03-30 MED ORDER — AMIODARONE HCL IN DEXTROSE 360-4.14 MG/200ML-% IV SOLN: 60.0000 mg/h | INTRAVENOUS | Status: DC

## 2024-03-30 MED ORDER — AMIODARONE HCL IN DEXTROSE 360-4.14 MG/200ML-% IV SOLN: 30.0000 mg/h | INTRAVENOUS | Status: DC

## 2024-03-30 MED ORDER — SODIUM CHLORIDE 0.9 % IV SOLN: INTRAVENOUS | Status: DC

## 2024-03-30 MED ORDER — HEPARIN (PORCINE) 25000 UT/250ML-% IV SOLN
350.0000 [IU]/h | INTRAVENOUS | Status: DC
  Administered 2024-03-30: 850 [IU]/h via INTRAVENOUS
  Administered 2024-03-31: 650 [IU]/h via INTRAVENOUS
  Filled 2024-03-30 (×2): qty 250

## 2024-03-30 MED ORDER — SODIUM CHLORIDE 0.9 % IV BOLUS
1000.0000 mL | Freq: Once | INTRAVENOUS | Status: AC
  Administered 2024-03-30: 1000 mL via INTRAVENOUS

## 2024-03-30 MED ORDER — CYANOCOBALAMIN 500 MCG PO TABS
500.0000 ug | ORAL_TABLET | Freq: Every day | ORAL | Status: DC
  Administered 2024-03-30 – 2024-04-02 (×4): 500 ug
  Filled 2024-03-30 (×4): qty 1

## 2024-03-30 MED ORDER — ROSUVASTATIN CALCIUM 20 MG PO TABS
20.0000 mg | ORAL_TABLET | Freq: Every day | ORAL | Status: DC
  Administered 2024-03-30 – 2024-04-02 (×4): 20 mg
  Filled 2024-03-30 (×4): qty 1

## 2024-03-30 MED ORDER — AMIODARONE LOAD VIA INFUSION: 150.0000 mg | Freq: Once | INTRAVENOUS | Status: DC

## 2024-03-30 MED ORDER — MAGNESIUM SULFATE IN D5W 1-5 GM/100ML-% IV SOLN
1.0000 g | Freq: Once | INTRAVENOUS | Status: AC
  Administered 2024-03-30: 1 g via INTRAVENOUS
  Filled 2024-03-30: qty 100

## 2024-03-30 MED ORDER — INSULIN ASPART 100 UNIT/ML IJ SOLN
0.0000 [IU] | INTRAMUSCULAR | Status: DC
  Administered 2024-03-30 – 2024-03-31 (×8): 3 [IU] via SUBCUTANEOUS
  Administered 2024-03-31: 5 [IU] via SUBCUTANEOUS
  Administered 2024-03-31 – 2024-04-01 (×3): 3 [IU] via SUBCUTANEOUS
  Administered 2024-04-01 (×3): 5 [IU] via SUBCUTANEOUS
  Administered 2024-04-02 (×3): 3 [IU] via SUBCUTANEOUS
  Administered 2024-04-02: 5 [IU] via SUBCUTANEOUS
  Filled 2024-03-30 (×3): qty 3
  Filled 2024-03-30: qty 5
  Filled 2024-03-30 (×2): qty 3
  Filled 2024-03-30: qty 5
  Filled 2024-03-30 (×3): qty 3
  Filled 2024-03-30: qty 5
  Filled 2024-03-30: qty 3
  Filled 2024-03-30 (×2): qty 1
  Filled 2024-03-30: qty 5
  Filled 2024-03-30: qty 1
  Filled 2024-03-30 (×3): qty 3
  Filled 2024-03-30: qty 5

## 2024-03-30 MED ORDER — GLUCERNA 1.5 CAL PO LIQD
1000.0000 mL | ORAL | Status: DC
  Administered 2024-03-30 – 2024-04-02 (×4): 1000 mL
  Filled 2024-03-30 (×5): qty 1000

## 2024-03-30 MED ORDER — LACTATED RINGERS IV BOLUS
500.0000 mL | Freq: Once | INTRAVENOUS | Status: AC
  Administered 2024-03-30: 500 mL via INTRAVENOUS

## 2024-03-30 MED ORDER — MAGNESIUM SULFATE 2 GM/50ML IV SOLN: 2.0000 g | Freq: Once | INTRAVENOUS | Status: DC

## 2024-03-30 MED ORDER — PANTOPRAZOLE SODIUM 40 MG IV SOLR
40.0000 mg | INTRAVENOUS | Status: DC
  Administered 2024-03-30 – 2024-04-03 (×5): 40 mg via INTRAVENOUS
  Filled 2024-03-30 (×4): qty 10

## 2024-03-30 MED ORDER — METOPROLOL TARTRATE 5 MG/5ML IV SOLN
5.0000 mg | Freq: Once | INTRAVENOUS | Status: AC | PRN
  Administered 2024-03-30: 5 mg via INTRAVENOUS
  Filled 2024-03-30: qty 5

## 2024-03-30 NOTE — TOC CAGE-AID Note (Signed)
 Transition of Care [facility]) - CAGE-AID Screening   Patient Details  Name: [name] MRN: [id number] Date of Birth: Aug 26, 1945  Transition of Care [facility]) CM/SW Contact:    Keenya Matera E Jeryl Umholtz, LCSW Phone Number: 03/30/2024, 8:53 AM   Clinical Narrative:    CAGE-AID Screening: Substance Abuse Screening unable to be completed due to: : Patient unable to participate

## 2024-03-30 NOTE — Inpatient Diabetes Management (Signed)
 Inpatient Diabetes Program Recommendations  AACE/ADA: New Consensus Statement on Inpatient Glycemic Control (2015)  Target Ranges:  Prepandial:   less than 140 mg/dL      Peak postprandial:   less than 180 mg/dL (1-2 hours)      Critically ill patients:  140 - 180 mg/dL   Lab Results  Component Value Date   GLUCAP 193 (H) 03/30/2024   HGBA1C 6.9 (H) 03/30/2024    Review of Glycemic Control  Latest Reference Range & Units 03/29/24 13:05 03/29/24 15:47 03/29/24 19:15 03/29/24 23:16 03/30/24 03:32 03/30/24 07:51 03/30/24 11:35  Glucose-Capillary 70 - 99 mg/dL 828 (H) 815 (H) 780 (H) 297 (H) 289 (H) 208 (H) 193 (H)   Diabetes history: DM 2 Outpatient Diabetes medications: Glipizide  10 mg Daily, metformin  500 mg tid Current orders for Inpatient glycemic control:  Novolog  0-15 units Q4 hours  Notified by RD starting Glucerna Tube feeds with a goal rate of 50 ml/hour  Note: Pt received Decadron  10 mg yesterday 11/20  Inpatient Diabetes Program Recommendations:    -   Start Novolog  4 units Q4 for Tube Feed Coverage (hold if tube feeds are stopped or held for any reason)  Thanks,  Clotilda Bull RN, MSN, BC-ADM Inpatient Diabetes Coordinator Team Pager [phone] (8a-5p)

## 2024-03-30 NOTE — Progress Notes (Addendum)
 STROKE TEAM PROGRESS NOTE    SIGNIFICANT HOSPITAL EVENTS 11/20: Patient admitted with acute onset aphasia and right-sided weakness, left MCA mechanical thrombectomy performed and TNK given.  Ischemic right lower extremity found after procedure, and thrombectomy performed. 11/21: Patient started on heparin  for atrial fibrillation  INTERIM HISTORY/SUBJECTIVE Events of last night noted.  Patient is in atrial fibrillation with rapid heart rate on the monitor.  She remains globally aphasic but does open eyes partially but will not follow commands.  She has dense right hemiplegia.  She withdraws left side spontaneously and purposefully. Patient has had some periods of hypotension overnight but is now hemodynamically stable.  She has had her 24-hour CT scan which shows no hemorrhagic transformation but large left middle cerebral artery infarct with mild cytotoxic edema..  Will start IV heparin  for atrial fibrillation.  OBJECTIVE  CBC    Component Value Date/Time   WBC 14.9 (H) 03/30/2024 0055   RBC 3.35 (L) 03/30/2024 0055   HGB 8.8 (L) 03/30/2024 0948   HGB 12.9 08/13/2013 1255   HCT 26.0 (L) 03/30/2024 0948   HCT 39.6 08/13/2013 1255   PLT 194 03/30/2024 0055   PLT 390 08/13/2013 1255   MCV 95.8 03/30/2024 0055   MCV 89 08/13/2013 1255   MCH 31.3 03/30/2024 0055   MCHC 32.7 03/30/2024 0055   RDW 14.7 03/30/2024 0055   RDW 14.2 08/13/2013 1255   LYMPHSABS 2.2 03/29/2024 1311   LYMPHSABS 1.8 08/13/2013 1255   MONOABS 0.7 03/29/2024 1311   MONOABS 0.7 08/13/2013 1255   EOSABS 0.4 03/29/2024 1311   EOSABS 0.2 08/13/2013 1255   BASOSABS 0.1 03/29/2024 1311   BASOSABS 0.1 08/13/2013 1255    BMET    Component Value Date/Time   NA 140 03/30/2024 0948   NA 139 08/13/2013 1255   K 4.0 03/30/2024 0948   K 4.1 08/13/2013 1255   CL 108 03/30/2024 0054   CL 104 08/13/2013 1255   CO2 18 (L) 03/30/2024 0054   CO2 30 08/13/2013 1255   GLUCOSE 308 (H) 03/30/2024 0054   GLUCOSE 147 (H)  08/13/2013 1255   BUN 23 03/30/2024 0054   BUN 14 08/13/2013 1255   CREATININE 1.81 (H) 03/30/2024 0054   CREATININE 1.62 (H) 08/13/2013 1255   CALCIUM  7.7 (L) 03/30/2024 0054   CALCIUM  9.1 08/13/2013 1255   GFRNONAA 28 (L) 03/30/2024 0054   GFRNONAA 32 (L) 08/13/2013 1255    IMAGING past 24 hours EXAM: CT HEAD WITHOUT CONTRAST 03/29/2024 09:01:34 PM   TECHNIQUE: CT of the head was performed without the administration of intravenous contrast. Automated exposure control, iterative reconstruction, and/or weight based adjustment of the mA/kV was utilized to reduce the radiation dose to as low as reasonably achievable.   COMPARISON: CT head 03/29/2024   CLINICAL HISTORY: Neuro deficit, acute, stroke suspected   FINDINGS:   BRAIN AND VENTRICLES: No acute hemorrhage. Interval development of hypoattenuation and loss of gray matter differentiation in the left MCA territory, including the left insula and overlying left frontal and parietal lobes as well as the anterior left temporal lobe. No hydrocephalus. No extra-axial collection. No mass effect or midline shift. Chronic microvascular ischemic disease.   ORBITS: No acute abnormality.   SINUSES: No acute abnormality.   SOFT TISSUES AND SKULL: No acute soft tissue abnormality. No skull fracture.   IMPRESSION: 1. Acute left MCA territory infarct. 2. No acute hemorrhage or mass effect.  EXAM: MRI Brain Without Contrast 03/30/2024 02:57:00 AM   TECHNIQUE: Multiplanar  multisequence MRI of the head/brain was performed without the administration of intravenous contrast.   COMPARISON: CT head 03/29/2024   CLINICAL HISTORY: Stroke, follow up   FINDINGS:   BRAIN AND VENTRICLES: Acute left MCA territory infarcts which involve the left basal ganglia, left insula, and overlying left frontal and parietal lobes. Associated edema without substantial mass effect or midline shift. Small amount of petechial hemorrhage. No  mass occupying acute hemorrhage. No mass. No hydrocephalus. Left cerebellar and left thalamic lacunar infarcts .   ORBITS: No acute abnormality.   SINUSES AND MASTOIDS: No acute abnormality.   BONES AND SOFT TISSUES: Normal marrow signal. No acute soft tissue abnormality.   IMPRESSION: 1. Acute left MCA territory infarcts. 2. Associated edema without substantial mass effect or midline shift. 3. Small amount of petechial hemorrhage. No mass occupying acute hemorrhage  EXAM: CT HEAD WITHOUT CONTRAST 03/30/2024 12:41:39 PM   TECHNIQUE: CT of the head was performed without the administration of intravenous contrast. Automated exposure control, iterative reconstruction, and/or weight based adjustment of the mA/kV was utilized to reduce the radiation dose to as low as reasonably achievable.   COMPARISON: 03/29/2024   CLINICAL HISTORY: Stroke, follow up; 24hour post-TNK imaging   FINDINGS:   BRAIN AND VENTRICLES: No acute hemorrhage. Possible subtle petechial hemorrhage. No evidence of hemorrhage conversion. Evolving acute infarct in the left MCA territory and possibly portions of the left PCA territory. There is mild edema and sulcal effacement particularly within the left temporal lobe and the left frontal operculum. Increased edema and mass effect on the left lateral ventricle. No midline shift. Similar appearance of chronic microvascular ischemic changes. Patchy white matter hypodensities compatible with chronic microvascular ischemic disease. Remote infarct in the left cerebellum. Chronic left cerebellar infarct. There is increased asymmetric density of the left MCA which could be related to recent procedure however recommend repeat CTA to ensure vessel patency and exclude repeat thrombosis of the vessel. Skull base atherosclerosis. Bilateral lens replacements. Partially visualized nasoenteric tube.   ORBITS: Bilateral lens replacements.   SINUSES: No acute  abnormality.   SOFT TISSUES AND SKULL: No acute soft tissue abnormality. No skull fracture. Skull base atherosclerosis. Partially visualized nasoenteric tube.   IMPRESSION: 1. Evolving acute infarct in the left MCA territory with mild edema and sulcal effacement, particularly within the left temporal lobe and the left frontal operculum. No midline shift. No evidence of hemorrhagic conversion. 2. Increased asymmetric density of the left MCA, possibly related to recent procedure. Repeat CTA is recommended to ensure vessel patency and exclude recurrent thrombosis. 3. Chronic microvascular ischemic changes and remote infarct in the left cerebellum.  Vitals:   03/30/24 1123 03/30/24 1200 03/30/24 1300 03/30/24 1325  BP:  (!) 148/118 (!) 146/104   Pulse:  (!) 134 (!) 139 (!) 109  Resp:  19 (!) 24 14  Temp:  98.6 F (37 C) 99 F (37.2 C) 99 F (37.2 C)  TempSrc:      SpO2:  98% 94% 97%  Weight:      Height: 5' 3 (1.6 m)        PHYSICAL EXAM General: Ill-appearing elderly patient in no acute distress CV: A-fib on the monitor Respiratory:  Regular, unlabored respirations on supplemental O2   NEURO:  Mental Status: Patient rests with eyes closed, does not follow commands or respond to name.  She will grimace to sternal rub and localize with her left upper extremity. Speech/Language: No verbal output  Pupils equal round and reactive, face symmetrical  at rest, patient will flicker right upper extremity to noxious stimuli, will localize with left upper extremity, no movement of bilateral lower extremities  Most Recent NIH  1a Level of Conscious.: 1 1b LOC Questions: 2 1c LOC Commands: 2 2 Best Gaze: 0 3 Visual: 0 4 Facial Palsy: 0 5a Motor Arm - left: 3 5b Motor Arm - Right: 2 6a Motor Leg - Left: 4 6b Motor Leg - Right: 4 7 Limb Ataxia: 0 8 Sensory: 0 9 Best Language: 3 10 Dysarthria: 2 11 Extinct. and Inatten.: 0 TOTAL: 23   ASSESSMENT/PLAN  [name] is a 78 y.o. female with history of A-fib not on anticoagulation, hypertension, diabetes, CKD and sleep apnea admitted for acute onset aphasia and right-sided weakness.  Patient was given TNK and taken to interventional radiology for mechanical thrombectomy, which was successful.  After thrombectomy, she was found to have an ischemic right lower extremity, and she was taken to the OR for right lower extremity thrombectomy.  MRI brain reveals acute left MCA territory infarcts, and 24-hour CT reveals no hemorrhagic transformation but hyperdense left MCA.  Given size of infarcts, MCA likely reoccluded.  Will start patient on heparin  for atrial fibrillation.  NIH on Admission 17  Acute Ischemic Infarct:  left MCA territory infarct s/p mechanical thrombectomy and TNK initially TICI 3 revascularization but subsequent reocclusion Etiology:  cardioembolic in the setting of a-fib not on anticoagulation  Code Stroke CT head No acute abnormality. Small vessel disease. Atrophy. ASPECTS 10.    CTA head & neck acute left MCA occlusion, aneurysm of left cavernous ICA 24 hour CT evolving infarct in left MCA territory with no hemorrhagic transformation, asymmetric density of left MCA MRI  large acute left MCA territory infarct 2D Echo EF 55-60%, mild LVH, mildly dilated right atrium, normal left atrial size, no atrial level shunt LDL 83 HgbA1c 6.9 VTE prophylaxis - fully anticoagulated with heparin  aspirin  81 mg daily prior to admission, now on heparin  IV  Therapy recommendations:  Pending Disposition:  pending  Atrial fibrillation Home Meds: metoprolol  100 mg daily Continue telemetry monitoring Begin anticoagulation with IV heparin    Hypertension Home meds:  amlodipine  10 mg daily, losartan 50 mg daily Stable Blood Pressure Goal: SBP 120-160 for first 24 hours then less than 180   Hyperlipidemia Home meds: None LDL 83, goal < 70 Add rosuvastatin  20 mg daily Continue statin at  discharge  Diabetes type II Controlled Home meds: Metformin  500 mg 3 times daily HgbA1c 6.9, goal < 7.0 CBGs SSI Recommend close follow-up with PCP for better DM control  Dysphagia Patient has post-stroke dysphagia, SLP consulted    Diet   Diet NPO time specified   Advance diet as tolerated  Other Stroke Risk Factors Obesity, Body mass index is 34.05 kg/m., BMI >/= 30 associated with increased stroke risk, recommend weight loss, diet and exercise as appropriate  Obstructive sleep apnea   Other Active Problems None  Hospital day # 1  Patient seen by NP with MD, MD to edit note as needed. Cortney E Everitt Clint Kill , MSN, AGACNP-BC [facility] See Amion for schedule and pager information 03/30/2024 2:08 PM   I have personally obtained history,examined this patient, reviewed notes, independently viewed imaging studies, participated in medical decision making and plan of care.ROS completed by me personally and pertinent positives fully documented  I have made any additions or clarifications directly to the above note. Agree with note above.  78 year old lady presented  with aphasia and right hemiparesis due to left M1 occlusion underwent successful mechanical thrombectomy but subsequently was found to have a ischemic right leg with femoral artery occlusion requiring emergent vascular surgery revascularization.  Neurological exam was not improved and MRI scan and CT scan today both showed large left MCA infarct which likely suggest left MCA reocclusion.  Patient prognosis is guarded.  Long discussion with patient's family at the bedside and answered questions about prognosis.  Family would like to observe for a few more days and see if she makes improvement.  They are agreeable to short-term feeding tube.  Will likely need cortrack tube for nutrition and medications.  Will start IV heparin  for stroke prevention given atrial fibrillation the patient is at risk for hemorrhagic  transformation given large size of stroke and her age.  Discussed the risk-benefit of IV heparin  with family and they are agreeable. This patient is critically ill and at significant risk of neurological worsening, death and care requires constant monitoring of vital signs, hemodynamics,respiratory and cardiac monitoring, extensive review of multiple databases, frequent neurological assessment, discussion with family, other specialists and medical decision making of high complexity.I have made any additions or clarifications directly to the above note.This critical care time does not reflect procedure time, or teaching time or supervisory time of PA/NP/Med Resident etc but could involve care discussion time.  I spent 50 minutes of neurocritical care time  in the care of  this patient.     Eather Popp, MD Medical Director [facility] Pager: [phone] 03/30/2024 3:28 PM   To contact Stroke Continuity provider, please refer to [email or web address]. After hours, contact General Neurology

## 2024-03-30 NOTE — Progress Notes (Signed)
 Initial Nutrition Assessment  DOCUMENTATION CODES:   Not applicable  INTERVENTION:   Initiate tube feeding via Cortrak: Glucerna 1.5 at 50 ml/h (1200 ml per day) Provides 1800 kcal, 99 gm protein, 911 ml free water daily  MVI with minerals daily via tube.  NUTRITION DIAGNOSIS:   Increased nutrient needs related to wound healing (VAC to R groin, DTI to bilateral buttocks) as evidenced by estimated needs.  GOAL:   Patient will meet greater than or equal to 90% of their needs  MONITOR:   TF tolerance, Skin  REASON FOR ASSESSMENT:   Consult Enteral/tube feeding initiation and management  ASSESSMENT:   78 yo female admitted with L MCA stroke. PMH includes HTN, arthritis, CKD, osteopenia, mini stroke 2003, DM-2.  S/P L MCA thrombectomy on admission. She developed R leg ischemia after the procedure and required surgical repair of the R common femoral artery and proximal SFA. Drain and VAC were also placed.   Patient is NPO, not alert enough to safely take POs. Cortrak placed today, tip is gastric. Received MD Consult for TF initiation and management.  Labs reviewed.  A1C 6.9 CBG: [phone]  Medications reviewed and include vitamin B-12, novolog , cleviprex  (currently off), protonix , mag sulfate. IVF: NS at 100 ml/h  Weight history reviewed.  08/21/23  90.7 kg 02/27/24  81.6 kg 03/29/24  83.2 kg --> 87.2 kg 10% weight loss x 6 months from April to October, unsure if this was intentional, could have been r/t fluids with hx of CKD. Per RN assessment, patient has very deep pitting edema to RLE.  I/O + 6 L since admission UOP 647 ml since admission yesterday  Drain R groin 125 ml output since placement yesterday  NUTRITION - FOCUSED PHYSICAL EXAM:  Deferred to follow-up  Diet Order:   Diet Order             Diet NPO time specified  Diet effective now                   EDUCATION NEEDS:   No education needs have been identified at this time  Skin:   Skin Assessment: Skin Integrity Issues: Skin Integrity Issues:: DTI, Other (Comment), Wound VAC DTI: bilateral buttocks Wound Vac: R groin Other: irritant contact dermatitis to abdomen  Last BM:  no BM documented  Height:   Ht Readings from Last 1 Encounters:  03/30/24 5' 3 (1.6 m)    Weight:   Wt Readings from Last 1 Encounters:  03/29/24 87.2 kg    Ideal Body Weight:  52.3 kg  BMI:  Body mass index is 34.05 kg/m.  Estimated Nutritional Needs:   Kcal:  1600-1800  Protein:  85-100 gm  Fluid:  1.6-1.8 L   Suzen HUNT RD, LDN, CNSC Contact via secure chat. If unavailable, use group chat RD Inpatient.

## 2024-03-30 NOTE — Telephone Encounter (Signed)
 Patient Product/process development scientist completed.    The patient is insured through [employer]. Patient has Medicare and is not eligible for a copay card, but may be able to apply for patient assistance or Medicare RX Payment Plan (Patient Must reach out to their plan, if eligible for payment plan), if available.    Ran test claim for Eliquis 5 mg and the current 30 day co-pay is $47.00.  Ran test claim for Xarelto 20 mg and the current 30 day co-pay is $47.00.  This test claim was processed through [facility]- copay amounts may vary at other pharmacies due to pharmacy/plan contracts, or as the patient moves through the different stages of their insurance plan.     Reyes Sharps, CPHT Pharmacy Technician Patient Advocate Specialist [facility] Patient Advocate Team Direct Number: [phone]  Fax: [phone]

## 2024-03-30 NOTE — Progress Notes (Signed)
 RUA infiltrated, bruised and swollen. Bruising in LAC that appears older. 1 successful PIV placed in LUA. No other suitable vasculature visualized w/US . If pt needs further access a central line is recommended. Pt is not a candidate for ML/PICC without approval from nephrology due to GFR.

## 2024-03-30 NOTE — Progress Notes (Signed)
 NAME:  [name], MRN:  [id number], DOB:  1946/03/15, LOS: 1 ADMISSION DATE:  03/29/2024, CONSULTATION DATE:  11/20 REFERRING MD:  Dr. Matthews, CHIEF COMPLAINT:  R sided weakenss   History of Present Illness:  Patient is encephalopathic and/or intubated; therefore, history has been obtained from chart review.  78 year old female with past medical history as below, which is significant for atrial fibrillation not on anticoagulation, hypertension, diabetes mellitus type 2, CKD, and OSA who presented to [facility] emergency department 11/20 with complaints of acute onset aphasia and right-sided weakness.  She was evaluated as a code stroke in the emergency department and was noted to be disoriented with a right facial droop or right arm weakness, and right leg weakness.  Initial CT of the head was negative for hemorrhage and CT angiogram showed acute large vessel occlusion of the left MCA.  Case was discussed with the multidisciplinary stroke team and the patient was administered intravenous TNK and was taken to IR for mechanical thrombectomy which was completed with aspiration to a TICI III result.  At the completion of the interventional radiology procedure the absence of Doppler signal in the right foot was noted and vascular surgery was consulted.  She was taken to the operating room by Dr. Gretta for repair of the right common femoral artery and proximal SFA.  Drain and wound VAC were placed.  Doppler signals were returned and the patient was transferred to the ICU for close monitoring and recovery.  PCCM is consulted for assistance with ICU management.   Pertinent  Medical History   has a past medical history of A-fib (HCC), Arthritis, Chronic kidney disease, Complication of anesthesia, Diabetes mellitus without complication (HCC), Dyspnea, Dysuria, History of kidney stones, Hypertension, Osteopenia, PONV (postoperative nausea and vomiting), Sleep apnea, Stroke (HCC), and Urine,  incontinence, stress female.   Significant Hospital Events: Including procedures, antibiotic start and stop dates in addition to other pertinent events   11/20 presented with right-sided weakness, given TNK and taken to IR.  After IR absent pulses distal to the surgical site.  Taken to the OR by vascular surgery for repair of the common femoral artery and SFA.  Interim History / Subjective:    Objective    Blood pressure 127/79, pulse (!) 120, temperature 98.6 F (37 C), resp. rate 15, weight 87.2 kg, SpO2 90%.        Intake/Output Summary (Last 24 hours) at 03/30/2024 0854 Last data filed at 03/30/2024 0641 Gross per 24 hour  Intake 6661.88 ml  Output 792 ml  Net 5869.88 ml   Filed Weights   03/29/24 1300 03/29/24 2210  Weight: 83.2 kg 87.2 kg    Examination: General: critically ill appearing on mech vent HEENT: MM pink/moist; ETT in place Neuro: lethargic; weak but withdraws to ext; perrl CV: s1s2, rate 110s, no m/r/g PULM:  dim clear BS bilaterally; [place] 6L; snoring respirations GI: soft, bsx4 active  Extremities: warm/dry, Wound VAC and drain bulb in the right groin. DP and PT pulses dopplerable on the right  Resolved problem list   Assessment and Plan   Acute left MCA stroke status post systemic TNK as well as mechanical thrombectomy Acute encephalopathy: anesthesia + aphasia. Post op Ct head without hemorrhage or mass effect. Plan: -stroke primary; appreciate recs -check stat abg -CT head scheduled later today; if abg unremarkable may need to check sooner -frequent neuro checks -limit sedating meds -clevi/levo for SBP goal 120-160 per neuro IR -statin, APT per neuro -Continue  neuroprotective measures- normothermia, euglycemia, HOB greater than 30, head in neutral alignment, normocapnia, normoxia -pt/ot/slp -echo pending -consider EEG  Atrial fibrillation - seems to have been postoperatively several years ago. As far as family is concerned she does not have  A fib. Is not on anticoagulation.  Hypertension Plan: -echo pending -tele monitoring -trend and replete electrolytes as needed - holding home amlodipine , furosemide , losartan, metoprolol  for now  Critical limb ischemia of the RLE following IR procedure: s/p repair 11/20 Plan: -vascular following -cont vascular checks -jp drain and wound vac in place  Acute blood loss anemia in the postoperative setting Plan: -trend cbc  DM2 Plan: -increase ssi and cont cbg monitoring  AKI superimposed on CKD Plan: -Trend BMP / urinary output -Replace electrolytes as indicated -Avoid nephrotoxic agents, ensure adequate renal perfusion  Hx of OSA - not on CPAP Plan: -currently on 6L [place]; cont [place] for sats >92% -check abg -check cxr to rule out aspiration given o2 requirements  Hx of anxiety Plan: -hold home xanax  for now  11/21 son and daughter updated at bedside, all questions answered.  Labs   CBC: Recent Labs  Lab 03/29/24 1311 03/29/24 1312 03/29/24 2312 03/30/24 0055  WBC 8.9  --   --  14.9*  NEUTROABS 5.6  --   --   --   HGB 15.9* 17.0* 9.9* 10.5*  HCT 47.2* 50.0* 29.0* 32.1*  MCV 94.2  --   --  95.8  PLT 228  --   --  194    Basic Metabolic Panel: Recent Labs  Lab 03/29/24 1311 03/29/24 1312 03/29/24 2312 03/30/24 0054  NA 135 137 138 139  K 4.7 4.6 4.5 4.9  CL 105 103  --  108  CO2 20*  --   --  18*  GLUCOSE 159* 156*  --  308*  BUN 23 28*  --  23  CREATININE 1.96* 2.10*  --  1.81*  CALCIUM  8.9  --   --  7.7*  MG  --   --   --  1.7  PHOS  --   --   --  4.1   GFR: Estimated Creatinine Clearance: 26.4 mL/min (A) (by C-G formula based on SCr of 1.81 mg/dL (H)). Recent Labs  Lab 03/29/24 1311 03/30/24 0055 03/30/24 0353  WBC 8.9 14.9*  --   LATICACIDVEN  --  4.0* 3.7*    Liver Function Tests: Recent Labs  Lab 03/29/24 1311 03/30/24 0054  AST 30 35  ALT 34 25  ALKPHOS 60 35*  BILITOT 0.8 1.3*  PROT 6.3* 4.9*  ALBUMIN  3.0* 3.1*   No  results for input(s): LIPASE, AMYLASE in the last 168 hours. No results for input(s): AMMONIA in the last 168 hours.  ABG    Component Value Date/Time   PHART 7.326 (L) 03/29/2024 2312   PCO2ART 31.5 (L) 03/29/2024 2312   PO2ART 122 (H) 03/29/2024 2312   HCO3 16.5 (L) 03/29/2024 2312   TCO2 17 (L) 03/29/2024 2312   ACIDBASEDEF 9.0 (H) 03/29/2024 2312   O2SAT 99 03/29/2024 2312     Coagulation Profile: Recent Labs  Lab 03/30/24 0055  INR 1.2    Cardiac Enzymes: No results for input(s): CKTOTAL, CKMB, CKMBINDEX, TROPONINI in the last 168 hours.  HbA1C: Hgb A1c MFr Bld  Date/Time Value Ref Range Status  03/30/2024 01:26 AM 6.9 (H) 4.8 - 5.6 % Final    Comment:    (NOTE) Diagnosis of Diabetes The following HbA1c ranges recommended by  the American Diabetes Association (ADA) may be used as an aid in the diagnosis of diabetes mellitus.  Hemoglobin             Suggested A1C NGSP%              Diagnosis  <5.7                   Non Diabetic  5.7-6.4                Pre-Diabetic  >6.4                   Diabetic  <7.0                   Glycemic control for                       adults with diabetes.    10/23/2021 03:01 PM 6.7 (H) 4.8 - 5.6 % Final    Comment:    (NOTE) Pre diabetes:          5.7%-6.4%  Diabetes:              >6.4%  Glycemic control for   <7.0% adults with diabetes     CBG: Recent Labs  Lab 03/29/24 1547 03/29/24 1915 03/29/24 2316 03/30/24 0332 03/30/24 0751  GLUCAP 184* 219* 297* 289* 208*    Review of Systems:   Patient is encephalopathic and/or intubated; therefore, history has been obtained from chart review.   Past Medical History:  She,  has a past medical history of A-fib (HCC), Arthritis, Chronic kidney disease, Complication of anesthesia, Diabetes mellitus without complication (HCC), Dyspnea, Dysuria, History of kidney stones, Hypertension, Osteopenia, PONV (postoperative nausea and vomiting), Sleep apnea, Stroke  (HCC), and Urine, incontinence, stress female.   Surgical History:   Past Surgical History:  Procedure Laterality Date   APPLICATION OF WOUND VAC  03/29/2024   Procedure: APPLICATION, WOUND VAC;  Surgeon: Gretta Lonni PARAS, MD;  Location: [facility] OR;  Service: Vascular;;   BREAST BIOPSY Right 11/22/2019   U/S Bx-HYDROMARK-BENIGN INTRAMAMMARY LYMPH NODE.   COLONOSCOPY W/ POLYPECTOMY Right    COLONOSCOPY WITH PROPOFOL  N/A 01/09/2015   Procedure: COLONOSCOPY WITH PROPOFOL ;  Surgeon: Lamar ONEIDA Holmes, MD;  Location: [facility] ENDOSCOPY;  Service: Endoscopy;  Laterality: N/A;   ERCP N/A 03/15/2019   Procedure: ENDOSCOPIC RETROGRADE CHOLANGIOPANCREATOGRAPHY (ERCP);  Surgeon: Jinny Carmine, MD;  Location: [facility] ENDOSCOPY;  Service: Endoscopy;  Laterality: N/A;   EYE SURGERY  2013   bilateral cataracts with implants   JOINT REPLACEMENT     left knee  TKR   KIDNEY STONE SURGERY     40 yrs ago, while pregnant, she had to 'be cut in 1/2 to get stone out'   KNEE ARTHROPLASTY Right 11/06/2021   Procedure: COMPUTER ASSISTED TOTAL KNEE ARTHROPLASTY - DEPUY;  Surgeon: Mardee Lynwood SQUIBB, MD;  Location: [facility] ORS;  Service: Orthopedics;  Laterality: Right;   KNEE ARTHROSCOPY     left knee   LITHOTRIPSY     ltk Left    LUMBAR LAMINECTOMY/DECOMPRESSION MICRODISCECTOMY N/A 09/06/2016   Procedure: LAMINECTOMY AND FORAMINOTOMY LUMBAR THREE- LUMBAR FOUR, LUMBAR FOUR- LUMBAR FIVE;  Surgeon: Reyes Budge, MD;  Location: [facility] OR;  Service: Neurosurgery;  Laterality: N/A;  LAMINECTOMY AND FORAMINOTOMY LUMBAR 3- LUMBAR 4, LUMBAR 4- LUMBAR 5    REVERSE SHOULDER ARTHROPLASTY Right 12/08/2020   Procedure: Right reverse total shoulder arthroplasty and  biceps tenodesis;  Surgeon: Tobie Priest, MD;  Location: [facility] ORS;  Service: Orthopedics;  Laterality: Right;   throidectomy  1979   goiter   THROMBECTOMY FEMORAL ARTERY Right 03/29/2024   Procedure: REPAIR OF RIGHT COMMON FEMORAL ARTERY AND SUPERFICIAL FEMORAL ARTERY WITH VEIN  PATCH ANGIOPLASTY;  Surgeon: Gretta Lonni PARAS, MD;  Location: [facility] OR;  Service: Vascular;  Laterality: Right;   THROMBECTOMY OF BYPASS GRAFT FEMORAL- TIBIAL ARTERY  03/29/2024   Procedure: RIGHT LEG THROMBECTOMY;  Surgeon: Gretta Lonni PARAS, MD;  Location: [facility] OR;  Service: Vascular;;   TRIGGER FINGER RELEASE Left      Social History:   reports that she has never smoked. She has never used smokeless tobacco. She reports current drug use. She reports that she does not drink alcohol .   Family History:  Her family history is negative for Breast cancer.   Allergies Allergies  Allergen Reactions   Prednisone Other (See Comments)    unknown   Lactose Intolerance (Gi)     Milk    Morphine  And Codeine Nausea And Vomiting   Vicodin [Hydrocodone-Acetaminophen ] Nausea And Vomiting    Tolerates acetaminophen      Home Medications  Prior to Admission medications   Medication Sig Start Date End Date Taking? Authorizing Provider  fluconazole (DIFLUCAN) 150 MG tablet Take 150 mg by mouth once. 03/19/24  Yes [provider]  losartan (COZAAR) 50 MG tablet Take 50 mg by mouth daily. 01/10/24 01/09/25 Yes [provider]  acetaminophen  (TYLENOL ) 500 MG tablet Take 500 mg by mouth every 6 (six) hours as needed.    [provider]  allopurinol  (ZYLOPRIM ) 100 MG tablet Take 100 mg by mouth daily.    [provider]  ALPRAZolam  (XANAX ) 0.5 MG tablet Take 0.5 mg by mouth 2 (two) times daily as needed for anxiety.    [provider]  amLODipine  (NORVASC ) 10 MG tablet Take 10 mg by mouth daily.  07/05/16   [provider]  aspirin  EC 81 MG tablet Take 81 mg by mouth daily. Swallow whole.    [provider]  Chlorpheniramine-DM 4-30 MG TABS Take by mouth.    [provider]  Cholecalciferol  (VITAMIN D3) 50 MCG (2000 UT) TABS Take 2,000 Units by mouth daily.    [provider]  furosemide  (LASIX ) 20 MG tablet Take 20 mg by mouth  daily as needed for edema.    [provider]  gabapentin  (NEURONTIN ) 100 MG capsule Take 100 mg by mouth 2 (two) times daily. 08/22/18   [provider]  glipiZIDE  (GLUCOTROL ) 5 MG tablet Take 5 mg by mouth daily before breakfast.    [provider]  loperamide  (IMODIUM ) 2 MG capsule Take by mouth as needed for diarrhea or loose stools.    [provider]  Melatonin 5 MG CAPS Take 1 tablet by mouth at bedtime as needed for sleep.    [provider]  metFORMIN  (GLUCOPHAGE ) 500 MG tablet Take 1 tablet by mouth 3 (three) times daily. 06/12/21   [provider]  metoprolol  (LOPRESSOR ) 100 MG tablet Take 100 mg by mouth daily.     [provider]  mupirocin  ointment (BACTROBAN ) 2 % Apply 1 Application topically 2 (two) times daily. 02/20/24   Corlis Burnard DEL, NP  nitrofurantoin (MACRODANTIN) 50 MG capsule Take 50 mg by mouth daily.    [provider]  nystatin  cream (MYCOSTATIN ) Apply to affected area 2 times daily 02/20/24   Corlis Burnard DEL, NP  ondansetron  (ZOFRAN -ODT)  4 MG disintegrating tablet Take 1 tablet (4 mg total) by mouth every 8 (eight) hours as needed for nausea or vomiting. 08/21/23   Suzanne Kirsch, MD  OVER THE COUNTER MEDICATION doTerra for healthy immnune system    [provider]  oxyCODONE  (OXY IR/ROXICODONE ) 5 MG immediate release tablet Take 1 tablet (5 mg total) by mouth every 4 (four) hours as needed for moderate pain (pain score 4-6). 11/07/21   Verlinda Boas, PA-C  pantoprazole  (PROTONIX ) 40 MG tablet Take 1 tablet (40 mg total) by mouth daily. 09/12/22   Patel, Sona, MD  rosuvastatin  (CRESTOR ) 10 MG tablet Take 10 mg by mouth daily.    [provider]  Semaglutide (OZEMPIC, 0.25 OR 0.5 MG/DOSE, [place]) Inject 0.5 mg into the skin every 7 (seven) days.    [provider]  trimethoprim  (TRIMPEX ) 100 MG tablet Take 1 tablet (100 mg total) by mouth daily. 02/27/24   Gaston Hamilton, MD  vitamin B-12  (CYANOCOBALAMIN ) 500 MCG tablet Take 500 mcg by mouth daily.    [provider]     Critical care time: 40 minutes     JD Emilio DEVONNA [facility] 03/30/2024, 9:37 AM  Please see [email or web address] for pager details.  From 7A-7P if no response, please call [phone]. After hours, please call ELink [phone].

## 2024-03-30 NOTE — Evaluation (Signed)
 Physical Therapy Evaluation Patient Details Name: [name] MRN: [id number] DOB: [birth year] Today's Date: 03/30/2024  History of Present Illness  78 y.o. female presents to [facility] hospital on 03/29/2024 with acute onset aphasia and R weakness. CTA shows and acute LVO of L MCA. Pt underwent thrombectomy on 11/20. After thrombectomy pt noted to lose perfusion to RLE, underwent repair of R common femoral and proximal SFA with angioplasty and RLE thrombectomy on 11/20. PMH includes HTN, DMII, CKD, OSA.  Clinical Impression  Pt presents to PT with deficits in strength, power, functional mobility, communication, awareness. Pt currently has R hemiplegia with R inattention. PT defers functional bed mobility due to tachycardia with limited activity in bed in chair position, up to 141 BPM. Pt does not follow verbal cues only and appears to have significant expressive and receptive aphasias. Pt will benefit from further PT assessment in regards to rehab potential and to progress to functional mobility tasks. Patient will benefit from continued inpatient follow up therapy, <3 hours/day.      If plan is discharge home, recommend the following: Two people to help with walking and/or transfers;Two people to help with bathing/dressing/bathroom;Assistance with cooking/housework;Assistance with feeding;Assist for transportation;Help with stairs or ramp for entrance;Supervision due to cognitive status;Direct supervision/assist for medications management;Direct supervision/assist for financial management   Can travel by private vehicle   No    Equipment Recommendations Hospital bed;Hoyer lift  Recommendations for Other Services       Functional Status Assessment Patient has had a recent decline in their functional status and demonstrates the ability to make significant improvements in function in a reasonable and predictable amount of time.     Precautions / Restrictions Precautions Precautions:  Fall Recall of Precautions/Restrictions: Impaired Precaution/Restrictions Comments: R groin JP drain and wound vac Restrictions Weight Bearing Restrictions Per Provider Order: No      Mobility  Bed Mobility               General bed mobility comments: pt repositioned from reverse trendelenburg to bed in chair position passively. Dependent for repositioning in bed    Transfers                        Ambulation/Gait                  Stairs            Wheelchair Mobility     Tilt Bed    Modified Rankin (Stroke Patients Only)       Balance                                             Pertinent Vitals/Pain Pain Assessment Pain Assessment: CPOT Facial Expression: Relaxed, neutral Body Movements: Absence of movements Muscle Tension: Relaxed Compliance with ventilator (intubated pts.): N/A Vocalization (extubated pts.): N/A CPOT Total: 0    Home Living Family/patient expects to be discharged to:: Private residence Living Arrangements: Spouse/significant other Available Help at Discharge: Family;Available 24 hours/day (spouse is on home hospice, unable to physically assist) Type of Home: House Home Access: Ramped entrance       Home Layout: One level Home Equipment: Rollator (4 wheels);Lift chair;BSC/3in1;Grab bars - tub/shower;Grab bars - toilet;Shower seat Additional Comments: Sleeps in lift chair. Has an adjustable bed.    Prior Function Prior Level of Function : Independent/Modified Independent  Mobility Comments: ambulatory with rollator       Extremity/Trunk Assessment   Upper Extremity Assessment Upper Extremity Assessment: Defer to OT evaluation    Lower Extremity Assessment Lower Extremity Assessment: RLE deficits/detail;LLE deficits/detail RLE Deficits / Details: RLE ankle and knee ROM WFL, hip ROM deferred due to groin wound vac and JP drain. PT notes response to light touch at R  foot RLE Sensation: decreased light touch LLE Deficits / Details: PROM WFL, difficult to assess strength due to communication deficits    Cervical / Trunk Assessment Cervical / Trunk Assessment: Kyphotic  Communication   Communication Communication: Impaired Factors Affecting Communication: Difficulty expressing self (pt appears to have receptive and expressive aphasias)    Cognition Arousal: Lethargic (arouses with stimulation but otherwise returns to sleep) Behavior During Therapy: Flat affect   PT - Cognitive impairments: Difficult to assess Difficult to assess due to: Impaired communication                       Following commands: Impaired Following commands impaired:  (pt does not follow any verbal commands, very inconsistently follows any gestural or visual cues)     Cueing Cueing Techniques: Verbal cues, Gestural cues, Visual cues, Tactile cues     General Comments General comments (skin integrity, edema, etc.): monitor reads pt to be in afib, HR fluctuating between 90-141 with limited activity. BP stable. Pt with significant edema to RLE and RUE, repositioned into elevation to avoid continued progression of edema. Pt is able to track past midline with significant cueing, does follow therapist to R side if starting in left visual field initially, appears very inattentive to R envorinment    Exercises     Assessment/Plan    PT Assessment Patient needs continued PT services  PT Problem List Decreased strength;Decreased activity tolerance;Decreased balance;Decreased mobility;Decreased cognition;Decreased knowledge of use of DME;Decreased safety awareness;Decreased knowledge of precautions;Cardiopulmonary status limiting activity;Impaired sensation       PT Treatment Interventions DME instruction;Functional mobility training;Therapeutic activities;Therapeutic exercise;Balance training;Neuromuscular re-education;Cognitive remediation;Patient/family education    PT  Goals (Current goals can be found in the Care Plan section)  Acute Rehab PT Goals Patient Stated Goal: to improve function and reduce caregiver burden PT Goal Formulation: With family Time For Goal Achievement: 04/13/24 Potential to Achieve Goals: Poor    Frequency Min 2X/week     Co-evaluation PT/OT/SLP Co-Evaluation/Treatment: Yes Reason for Co-Treatment: Complexity of the patient's impairments (multi-system involvement);For patient/therapist safety;Necessary to address cognition/behavior during functional activity;To address functional/ADL transfers PT goals addressed during session: Mobility/safety with mobility;Strengthening/ROM         AM-PAC PT 6 Clicks Mobility  Outcome Measure Help needed turning from your back to your side while in a flat bed without using bedrails?: Total Help needed moving from lying on your back to sitting on the side of a flat bed without using bedrails?: Total Help needed moving to and from a bed to a chair (including a wheelchair)?: Total Help needed standing up from a chair using your arms (e.g., wheelchair or bedside chair)?: Total Help needed to walk in hospital room?: Total Help needed climbing 3-5 steps with a railing? : Total 6 Click Score: 6    End of Session   Activity Tolerance: Patient tolerated treatment well Patient left: in bed;with call bell/phone within reach;with bed alarm set;with family/visitor present Nurse Communication: Mobility status;Need for lift equipment PT Visit Diagnosis: Other abnormalities of gait and mobility (R26.89);Muscle weakness (generalized) (M62.81);Hemiplegia and hemiparesis;Other symptoms and signs  involving the nervous system (R29.898) Hemiplegia - Right/Left: Right Hemiplegia - dominant/non-dominant: Dominant Hemiplegia - caused by: Cerebral infarction    Time: 1440-1510 PT Time Calculation (min) (ACUTE ONLY): 30 min   Charges:   PT Evaluation $PT Eval High Complexity: 1 High   PT General  Charges $$ ACUTE PT VISIT: 1 Visit         Bernardino JINNY Ruth, PT, DPT Acute Rehabilitation Office [phone]  Bernardino JINNY Ruth 03/30/2024, 4:09 PM

## 2024-03-30 NOTE — Progress Notes (Addendum)
 Progress Note    03/30/2024 6:59 AM 1 Day Post-Op  Subjective:  aphasic  Afebrile   Vitals:   03/30/24 0630 03/30/24 0645  BP:    Pulse: (!) 130 (!) 127  Resp: 13 14  Temp: 98.2 F (36.8 C) 97.7 F (36.5 C)  SpO2: 94% 95%    Physical Exam: General:  no distress Cardiac:  irregular Lungs:  non labored Incisions:  right groin with prevena; JP drain in place Extremities:  brisk doppler flow right DP/PT Abdomen:  soft  CBC    Component Value Date/Time   WBC 14.9 (H) 03/30/2024 0055   RBC 3.35 (L) 03/30/2024 0055   HGB 10.5 (L) 03/30/2024 0055   HGB 12.9 08/13/2013 1255   HCT 32.1 (L) 03/30/2024 0055   HCT 39.6 08/13/2013 1255   PLT 194 03/30/2024 0055   PLT 390 08/13/2013 1255   MCV 95.8 03/30/2024 0055   MCV 89 08/13/2013 1255   MCH 31.3 03/30/2024 0055   MCHC 32.7 03/30/2024 0055   RDW 14.7 03/30/2024 0055   RDW 14.2 08/13/2013 1255   LYMPHSABS 2.2 03/29/2024 1311   LYMPHSABS 1.8 08/13/2013 1255   MONOABS 0.7 03/29/2024 1311   MONOABS 0.7 08/13/2013 1255   EOSABS 0.4 03/29/2024 1311   EOSABS 0.2 08/13/2013 1255   BASOSABS 0.1 03/29/2024 1311   BASOSABS 0.1 08/13/2013 1255    BMET    Component Value Date/Time   NA 139 03/30/2024 0054   NA 139 08/13/2013 1255   K 4.9 03/30/2024 0054   K 4.1 08/13/2013 1255   CL 108 03/30/2024 0054   CL 104 08/13/2013 1255   CO2 18 (L) 03/30/2024 0054   CO2 30 08/13/2013 1255   GLUCOSE 308 (H) 03/30/2024 0054   GLUCOSE 147 (H) 08/13/2013 1255   BUN 23 03/30/2024 0054   BUN 14 08/13/2013 1255   CREATININE 1.81 (H) 03/30/2024 0054   CREATININE 1.62 (H) 08/13/2013 1255   CALCIUM  7.7 (L) 03/30/2024 0054   CALCIUM  9.1 08/13/2013 1255   GFRNONAA 28 (L) 03/30/2024 0054   GFRNONAA 32 (L) 08/13/2013 1255   GFRAA 42 (L) 01/24/2019 1137   GFRAA 37 (L) 08/13/2013 1255    INR    Component Value Date/Time   INR 1.2 03/30/2024 0055     Intake/Output Summary (Last 24 hours) at 03/30/2024 0659 Last data filed at  03/30/2024 0641 Gross per 24 hour  Intake 6661.88 ml  Output 792 ml  Net 5869.88 ml      Assessment/Plan:  78 y.o. female is s/p:  1.  Repair of right common femoral artery and proximal SFA with removal of closure device and vein patch angioplasty using great saphenous vein 2.  Right lower extremity thrombectomy 3.  Drain placement in right groin wound (19 [address]) 4.  Incisional Prevena VAC right groin  1 Day Post-Op   -pt with brisk doppler flow right DP/PT -JP drain with 125cc out since surgery.  Continue with JP drain.  -continue prevena incisional vac as she is at higher risk for breakdown of wound given obesity.  If it loses its seal, Dr. Gretta discussed with nursing it can be removed and place dry 4x4's in right groin to keep dry as she is at high risk for wound issues.  -DVT prophylaxis:  received TNK yesterday.  Ok for heparin  per vascular if and when indicated.    Lucie Apt, PA-C [facility] [phone] 03/30/2024 6:59 AM  I have seen and evaluated the  patient. I agree with the PA note as documented above.  Postop day 1 status post repair of right common femoral artery and proximal SFA with vein patch angioplasty for ischemic extremity after closure following transfemoral access in neuro IR.  Brisk triphasic PT signal at the right ankle.  Drain in place with serosanguineous output and would leave today.  Groin has high risk for breakdown with wound problems as discussed with family.  Incisional vac to groin for now.  Discussed if this fails can remove incisional VAC and placed dry dressings in the groin  Lonni DOROTHA Gaskins, MD [facility] [employer] Office: [phone]

## 2024-03-30 NOTE — Procedures (Signed)
 Cortrak  Person Inserting Tube:  [name], RD Tube Type:  Cortrak - 43 inches Tube Size:  10 Tube Location:  Left nare Secured by: Bridle Initial Placement:  Gastric Technique Used to Measure Tube Placement:  Marking at nare/corner of mouth Cortrak Secured At:  61 cm Initial Placement Verification:  Cortrak device (Registered Dieticians Only)  Cortrak Tube Team Note:  Consult received to place a Cortrak feeding tube.   No x-ray is required. RN may begin using tube.   If the tube becomes dislodged please keep the tube and contact the Cortrak team at [email or web address] for replacement.  If after hours and replacement cannot be delayed, place a NG tube and confirm placement with an abdominal x-ray.    Kelsey Elihue, MS, RDN, LDN Clinical Dietitian I Please reach out via secure chat

## 2024-03-30 NOTE — Progress Notes (Signed)
 PHARMACY - ANTICOAGULATION CONSULT NOTE  Pharmacy Consult for heparin   Indication: atrial fibrillation  Allergies  Allergen Reactions   Prednisone Other (See Comments)    unknown   Lactose Intolerance (Gi)     Milk    Morphine  And Codeine Nausea And Vomiting   Vicodin [Hydrocodone-Acetaminophen ] Nausea And Vomiting    Tolerates acetaminophen     Patient Measurements: Height: 5' 3 (160 cm) Weight: 87.2 kg (192 lb 3.9 oz) IBW/kg (Calculated) : 52.4 72kg heparin  dosing weight   Vital Signs: Temp: 98.8 F (37.1 C) (11/21 1400) Temp Source: Bladder (11/21 0400) BP: 129/89 (11/21 1400) Pulse Rate: 108 (11/21 1400)  Labs: Recent Labs    03/29/24 1311 03/29/24 1312 03/29/24 1831 03/29/24 2312 03/30/24 0054 03/30/24 0055 03/30/24 0948  HGB 15.9* 17.0*   < > 9.9*  --  10.5* 8.8*  HCT 47.2* 50.0*   < > 29.0*  --  32.1* 26.0*  PLT 228  --   --   --   --  194  --   APTT  --   --   --   --   --  28  --   LABPROT  --   --   --   --   --  15.9*  --   INR  --   --   --   --   --  1.2  --   CREATININE 1.96* 2.10*  --   --  1.81*  --   --    < > = values in this interval not displayed.    Estimated Creatinine Clearance: 26.4 mL/min (A) (by C-G formula based on SCr of 1.81 mg/dL (H)).   Medical History: Past Medical History:  Diagnosis Date   A-fib (HCC)    back in 2013, for stent placement in kidney, she had a bout of a-fib.  Now, back in rhythm   Arthritis    Chronic kidney disease    Complication of anesthesia    Diabetes mellitus without complication (HCC)    type 2    only takes metformin    Dyspnea    Dysuria    History of kidney stones    Hypertension    Osteopenia    PONV (postoperative nausea and vomiting)    Sleep apnea    lost over 120 lbs, and no long uses it (close to 2 yrs now)   Stroke [facility])    mini 2003--affected right side of body.   took 6 weeks to get over.   Urine, incontinence, stress female     Assessment: Patient admitted with AIS s/p MER  and TNK (11/20 @ 13:48). Suspected due to Afib as patient has been in afib while admitted. HgB 8.8 and PLTs 149 from 11/20. Patient not on anticoagulation PTA.   Pharmacy consulted to dose heparin  gtt.   Goal of Therapy:  Heparin  level 0.3-0.5 units/ml Monitor platelets by anticoagulation protocol: Yes   Plan:  No bolus with recent AIS.  Start heparin  infusion at 850 units/hr Check anti-Xa level in 8 hours and daily while on heparin  Continue to monitor H&H and platelets  Powell Blush, PharmD, BCCCP  03/30/2024,2:41 PM

## 2024-03-30 NOTE — Evaluation (Signed)
 Occupational Therapy Evaluation Patient Details Name: [name] MRN: [id number] DOB: 1946-02-26 Today's Date: 03/30/2024   History of Present Illness   78 y.o. female presents to [facility] hospital on 03/29/2024 with acute onset aphasia and R weakness. CTA shows and acute LVO of L MCA. Pt underwent thrombectomy on 11/20. After thrombectomy pt noted to lose perfusion to RLE, underwent repair of R common femoral and proximal SFA with angioplasty and RLE thrombectomy on 11/20. PMH includes HTN, DMII, CKD, OSA.     Clinical Impressions Pt admitted with the above concerns. Pt currently with functional limitations due to the deficits listed below (see OT Problem List). Prior to admit, pt was living at home with husband and was mod I - independent with  Pt will benefit from acute skilled OT to increase their safety and independence with ADL and functional mobility for ADL to facilitate discharge. Patient will benefit from continued inpatient follow up therapy, <3 hours/day. Due to limited participation, therapy will continue to assess discharge recommendation and update it as needed.        If plan is discharge home, recommend the following:    (Pt requires total care)     Functional Status Assessment   Patient has had a recent decline in their functional status and demonstrates the ability to make significant improvements in function in a reasonable and predictable amount of time.     Equipment Recommendations   [employer];Hospital bed;Wheelchair cushion (measurements OT);Wheelchair (measurements OT);Tub/shower bench;BSC/3in1;Other (comment) (TBD)      Precautions/Restrictions   Precautions Precautions: Fall Recall of Precautions/Restrictions: Impaired Precaution/Restrictions Comments: R groin JP drain and wound vac Restrictions Weight Bearing Restrictions Per Provider Order: No     Mobility Bed Mobility  General bed mobility comments: pt repositioned from reverse  trendelenburg to bed in chair position passively. Dependent for repositioning in bed    Transfers    General transfer comment: deferred          ADL either performed or assessed with clinical judgement   ADL     General ADL Comments: Pt requires total A x2 at bed level.     Vision Baseline Vision/History: 1 Wears glasses Ability to See in Adequate Light: 0 Adequate Patient Visual Report:  (unable to assess) Vision Assessment?: Yes;Vision impaired- to be further tested in functional context Additional Comments: unable to follow directions to assess.     Perception Perception: Impaired Preception Impairment Details: Inattention/Neglect Perception-Other Comments: Right side neglect. physical and visual   Praxis Praxis: Not tested       Pertinent Vitals/Pain Pain Assessment Pain Assessment: CPOT Facial Expression: Relaxed, neutral Body Movements: Absence of movements Muscle Tension: Relaxed Compliance with ventilator (intubated pts.): N/A Vocalization (extubated pts.): N/A CPOT Total: 0     Extremity/Trunk Assessment Upper Extremity Assessment Upper Extremity Assessment: Right hand dominant;RUE deficits/detail;LUE deficits/detail RUE Deficits / Details: No active movement noted. Functional P/ROM acheived. Increased swelling noted and extensive bruising on medial portion of arm RUE Sensation:  (unable to assess) RUE Coordination:  (absent) LUE Deficits / Details: Noted pt assisted with shoulder ROM although unable to fully assess d/t impaired cognition LUE Sensation:  (unable to assess) LUE Coordination:  (impaired)   Lower Extremity Assessment Lower Extremity Assessment: Defer to PT evaluation RLE Deficits / Details: RLE ankle and knee ROM WFL, hip ROM deferred due to groin wound vac and JP drain. PT notes response to light touch at R foot RLE Sensation: decreased light touch LLE Deficits / Details: PROM  WFL, difficult to assess strength due to communication  deficits   Cervical / Trunk Assessment Cervical / Trunk Assessment: Kyphotic   Communication Communication Communication: Impaired Factors Affecting Communication: Difficulty expressing self (pt appears to have receptive and expressive aphasias)   Cognition Arousal: Lethargic (arouses with stimulation but otherwise returns to sleep) Behavior During Therapy: Flat affect Cognition: Cognition impaired     Awareness: Intellectual awareness impaired, Online awareness impaired Memory impairment (select all impairments): Short-term memory, Working civil service fast streamer, Non-declarative long-term memory, Geneticist, Molecular long-term memory Attention impairment (select first level of impairment): Focused attention Executive functioning impairment (select all impairments): Initiation, Organization, Sequencing, Reasoning, Problem solving    Following commands: Impaired Following commands impaired:  (pt does not follow any verbal commands, very inconsistently follows any gestural or visual cues)     Cueing  General Comments   Cueing Techniques: Verbal cues;Gestural cues;Visual cues;Tactile cues  monitor reads pt to be in afib, HR fluctuating between 90-141 with limited activity. BP stable. Pt with significant edema to RLE and RUE, repositioned into elevation to avoid continued progression of edema. Pt is able to track past midline with significant cueing, does follow therapist to R side if starting in left visual field initially, appears very inattentive to R environment..           Home Living Family/patient expects to be discharged to:: Private residence Living Arrangements: Spouse/significant other Available Help at Discharge: Family;Available 24 hours/day (spouse is on home hospice, unable to physically assist) Type of Home: House Home Access: Ramped entrance     Home Layout: One level     Bathroom Shower/Tub: Producer, Television/film/video: Handicapped height     Home Equipment: Rollator (4  wheels);Lift chair;BSC/3in1;Grab bars - tub/shower;Grab bars - toilet;Shower seat   Additional Comments: Sleeps in lift chair. Has an adjustable bed.      Prior Functioning/Environment Prior Level of Function : Independent/Modified Independent    Mobility Comments: ambulatory with rollator      OT Problem List: Decreased strength;Decreased range of motion;Decreased activity tolerance;Impaired balance (sitting and/or standing);Impaired vision/perception;Decreased coordination;Decreased safety awareness;Decreased cognition;Decreased knowledge of use of DME or AE;Decreased knowledge of precautions;Impaired sensation;Impaired tone;Obesity;Impaired UE functional use;Increased edema   OT Treatment/Interventions: Self-care/ADL training;Therapeutic exercise;Neuromuscular education;Energy conservation;DME and/or AE instruction;Manual therapy;Therapeutic activities;Splinting;Cognitive remediation/compensation;Visual/perceptual remediation/compensation;Patient/family education;Balance training      OT Goals(Current goals can be found in the care plan section)   Acute Rehab OT Goals Patient Stated Goal: n/a OT Goal Formulation: With family Time For Goal Achievement: 04/13/24 Potential to Achieve Goals: Fair   OT Frequency:  Min 2X/week    Co-evaluation PT/OT/SLP Co-Evaluation/Treatment: Yes Reason for Co-Treatment: Complexity of the patient's impairments (multi-system involvement);For patient/therapist safety;Necessary to address cognition/behavior during functional activity;To address functional/ADL transfers PT goals addressed during session: Mobility/safety with mobility;Strengthening/ROM OT goals addressed during session: Strengthening/ROM      AM-PAC OT 6 Clicks Daily Activity     Outcome Measure Help from another person eating meals?: Total Help from another person taking care of personal grooming?: Total Help from another person toileting, which includes using toliet, bedpan, or  urinal?: Total Help from another person bathing (including washing, rinsing, drying)?: Total Help from another person to put on and taking off regular upper body clothing?: Total Help from another person to put on and taking off regular lower body clothing?: Total 6 Click Score: 6   End of Session Nurse Communication: Mobility status  Activity Tolerance: Patient limited by fatigue;Patient limited by lethargy Patient left: in bed;with call  bell/phone within reach;with family/visitor present  OT Visit Diagnosis: Unsteadiness on feet (R26.81);Muscle weakness (generalized) (M62.81);Other symptoms and signs involving cognitive function;Other symptoms and signs involving the nervous system (R29.898);Hemiplegia and hemiparesis Hemiplegia - Right/Left: Right Hemiplegia - dominant/non-dominant: Dominant Hemiplegia - caused by: Cerebral infarction                Time: 8557-8491 OT Time Calculation (min): 26 min Charges:  OT General Charges $OT Visit: 1 Visit OT Evaluation $OT Eval Moderate Complexity: 1 Mod  [employer], OTR/L,CBIS  Supplemental OT - [facility] and [facility] Secure Chat Preferred    Le[employer], [name] 03/30/2024, 4:41 PM

## 2024-03-30 NOTE — Anesthesia Postprocedure Evaluation (Signed)
 Anesthesia Post Note  Patient: Roslind Michaux  Procedure(s) Performed: REPAIR OF RIGHT COMMON FEMORAL ARTERY AND SUPERFICIAL FEMORAL ARTERY WITH VEIN PATCH ANGIOPLASTY (Right) RIGHT LEG THROMBECTOMY APPLICATION, WOUND VAC     Patient location during evaluation: PACU Anesthesia Type: General Level of consciousness: awake Pain management: pain level controlled Vital Signs Assessment: post-procedure vital signs reviewed and stable Respiratory status: spontaneous breathing, nonlabored ventilation and respiratory function stable Cardiovascular status: blood pressure returned to baseline and stable Postop Assessment: no apparent nausea or vomiting Anesthetic complications: no   No notable events documented.  Last Vitals:  Vitals:   03/29/24 2300 03/30/24 0000  BP:  (!) 124/92  Pulse: (!) 113 (!) 126  Resp: 16 (!) 21  Temp: 36.5 C 36.5 C  SpO2: 97% 99%    Last Pain:  Vitals:   03/29/24 2242  TempSrc: Bladder  PainSc:                  Bernardino SQUIBB Dajah Fischman

## 2024-03-31 ENCOUNTER — Inpatient Hospital Stay (HOSPITAL_COMMUNITY)

## 2024-03-31 ENCOUNTER — Other Ambulatory Visit: Payer: Self-pay

## 2024-03-31 DIAGNOSIS — I63412 Cerebral infarction due to embolism of left middle cerebral artery: Secondary | ICD-10-CM | POA: Diagnosis not present

## 2024-03-31 DIAGNOSIS — G9340 Encephalopathy, unspecified: Secondary | ICD-10-CM | POA: Diagnosis not present

## 2024-03-31 DIAGNOSIS — I10 Essential (primary) hypertension: Secondary | ICD-10-CM | POA: Diagnosis not present

## 2024-03-31 DIAGNOSIS — I4891 Unspecified atrial fibrillation: Secondary | ICD-10-CM | POA: Diagnosis not present

## 2024-03-31 DIAGNOSIS — E1151 Type 2 diabetes mellitus with diabetic peripheral angiopathy without gangrene: Secondary | ICD-10-CM | POA: Diagnosis not present

## 2024-03-31 DIAGNOSIS — R29725 NIHSS score 25: Secondary | ICD-10-CM

## 2024-03-31 DIAGNOSIS — N1832 Chronic kidney disease, stage 3b: Secondary | ICD-10-CM

## 2024-03-31 DIAGNOSIS — I63512 Cerebral infarction due to unspecified occlusion or stenosis of left middle cerebral artery: Secondary | ICD-10-CM | POA: Diagnosis not present

## 2024-03-31 LAB — COMPREHENSIVE METABOLIC PANEL WITH GFR
ALT: 22 U/L (ref 0–44)
AST: 38 U/L (ref 15–41)
Albumin: 2.8 g/dL — ABNORMAL LOW (ref 3.5–5.0)
Alkaline Phosphatase: 40 U/L (ref 38–126)
Anion gap: 11 (ref 5–15)
BUN: 23 mg/dL (ref 8–23)
CO2: 20 mmol/L — ABNORMAL LOW (ref 22–32)
Calcium: 8.4 mg/dL — ABNORMAL LOW (ref 8.9–10.3)
Chloride: 108 mmol/L (ref 98–111)
Creatinine, Ser: 1.68 mg/dL — ABNORMAL HIGH (ref 0.44–1.00)
GFR, Estimated: 31 mL/min — ABNORMAL LOW (ref >60)
Glucose, Bld: 201 mg/dL — ABNORMAL HIGH (ref 70–99)
Potassium: 4.6 mmol/L (ref 3.5–5.1)
Sodium: 139 mmol/L (ref 135–145)
Total Bilirubin: 0.6 mg/dL (ref 0.0–1.2)
Total Protein: 4.9 g/dL — ABNORMAL LOW (ref 6.5–8.1)

## 2024-03-31 LAB — CBC
HCT: 30.2 % — ABNORMAL LOW (ref 36.0–46.0)
Hemoglobin: 9.8 g/dL — ABNORMAL LOW (ref 12.0–15.0)
MCH: 31.1 pg (ref 26.0–34.0)
MCHC: 32.5 g/dL (ref 30.0–36.0)
MCV: 95.9 fL (ref 80.0–100.0)
Platelets: 139 K/uL — ABNORMAL LOW (ref 150–400)
RBC: 3.15 MIL/uL — ABNORMAL LOW (ref 3.87–5.11)
RDW: 15.5 % (ref 11.5–15.5)
WBC: 13.6 K/uL — ABNORMAL HIGH (ref 4.0–10.5)
nRBC: 0.1 % (ref 0.0–0.2)

## 2024-03-31 LAB — HEPARIN LEVEL (UNFRACTIONATED)
Heparin Unfractionated: 0.38 [IU]/mL (ref 0.30–0.70)
Heparin Unfractionated: 0.54 [IU]/mL (ref 0.30–0.70)
Heparin Unfractionated: 0.68 [IU]/mL (ref 0.30–0.70)
Heparin Unfractionated: 0.87 [IU]/mL — ABNORMAL HIGH (ref 0.30–0.70)

## 2024-03-31 LAB — GLUCOSE, CAPILLARY
Glucose-Capillary: 181 mg/dL — ABNORMAL HIGH (ref 70–99)
Glucose-Capillary: 196 mg/dL — ABNORMAL HIGH (ref 70–99)
Glucose-Capillary: 193 mg/dL — ABNORMAL HIGH (ref 70–99)
Glucose-Capillary: 177 mg/dL — ABNORMAL HIGH (ref 70–99)
Glucose-Capillary: 181 mg/dL — ABNORMAL HIGH (ref 70–99)
Glucose-Capillary: 238 mg/dL — ABNORMAL HIGH (ref 70–99)

## 2024-03-31 LAB — PHOSPHORUS: Phosphorus: 2.5 mg/dL (ref 2.5–4.6)

## 2024-03-31 LAB — MAGNESIUM: Magnesium: 2.4 mg/dL (ref 1.7–2.4)

## 2024-03-31 LAB — LACTIC ACID, PLASMA: Lactic Acid, Venous: 1.9 mmol/L (ref 0.5–1.9)

## 2024-03-31 MED ORDER — METOPROLOL TARTRATE 25 MG PO TABS
25.0000 mg | ORAL_TABLET | Freq: Two times a day (BID) | ORAL | Status: DC
  Administered 2024-03-31 (×2): 25 mg
  Filled 2024-03-31 (×2): qty 1

## 2024-03-31 MED ORDER — AMIODARONE HCL IN DEXTROSE 360-4.14 MG/200ML-% IV SOLN
30.0000 mg/h | INTRAVENOUS | Status: DC
  Filled 2024-03-31: qty 200

## 2024-03-31 MED ORDER — LACTATED RINGERS IV BOLUS
500.0000 mL | Freq: Once | INTRAVENOUS | Status: AC
  Administered 2024-03-31: 500 mL via INTRAVENOUS

## 2024-03-31 MED ORDER — ORAL CARE MOUTH RINSE
15.0000 mL | OROMUCOSAL | Status: DC
  Administered 2024-03-31 – 2024-04-02 (×10): 15 mL via OROMUCOSAL

## 2024-03-31 MED ORDER — INSULIN GLARGINE-YFGN 100 UNIT/ML ~~LOC~~ SOLN
5.0000 [IU] | Freq: Two times a day (BID) | SUBCUTANEOUS | Status: DC
  Administered 2024-03-31 (×2): 5 [IU] via SUBCUTANEOUS
  Filled 2024-03-31 (×5): qty 0.05

## 2024-03-31 MED ORDER — SODIUM CHLORIDE 0.9% FLUSH: 10.0000 mL | INTRAVENOUS | Status: DC | PRN

## 2024-03-31 MED ORDER — AMIODARONE LOAD VIA INFUSION
150.0000 mg | Freq: Once | INTRAVENOUS | Status: AC
  Administered 2024-03-31: 150 mg via INTRAVENOUS
  Filled 2024-03-31: qty 83.34

## 2024-03-31 MED ORDER — AMIODARONE LOAD VIA INFUSION
150.0000 mg | Freq: Once | INTRAVENOUS | Status: DC
  Filled 2024-03-31: qty 83.34

## 2024-03-31 MED ORDER — AMIODARONE HCL IN DEXTROSE 360-4.14 MG/200ML-% IV SOLN: 60.0000 mg/h | INTRAVENOUS | Status: DC

## 2024-03-31 MED ORDER — AMIODARONE HCL IN DEXTROSE 360-4.14 MG/200ML-% IV SOLN
60.0000 mg/h | INTRAVENOUS | Status: AC
  Administered 2024-03-31 (×2): 60 mg/h via INTRAVENOUS
  Filled 2024-03-31: qty 200

## 2024-03-31 MED ORDER — ORAL CARE MOUTH RINSE: 15.0000 mL | OROMUCOSAL | Status: DC | PRN

## 2024-03-31 MED ORDER — SODIUM CHLORIDE 0.9% FLUSH
10.0000 mL | Freq: Two times a day (BID) | INTRAVENOUS | Status: DC
  Administered 2024-03-31 – 2024-04-03 (×6): 10 mL

## 2024-03-31 MED ORDER — AMIODARONE HCL IN DEXTROSE 360-4.14 MG/200ML-% IV SOLN
30.0000 mg/h | INTRAVENOUS | Status: DC
  Administered 2024-04-01 (×2): 30 mg/h via INTRAVENOUS
  Filled 2024-03-31: qty 200

## 2024-03-31 NOTE — Progress Notes (Addendum)
 PHARMACY - ANTICOAGULATION CONSULT NOTE  Pharmacy Consult for heparin   Indication: atrial fibrillation  Allergies  Allergen Reactions   Prednisone Other (See Comments)    unknown   Lactose Intolerance (Gi)     Milk    Morphine  And Codeine Nausea And Vomiting   Vicodin [Hydrocodone-Acetaminophen ] Nausea And Vomiting    Tolerates acetaminophen     Patient Measurements: Height: 5' 3 (160 cm) Weight: 93.8 kg (206 lb 12.7 oz) IBW/kg (Calculated) : 52.4 HEPARIN  DW (KG): 72  Vital Signs: Temp: 99.3 F (37.4 C) (11/22 0600) Temp Source: Bladder (11/21 2000) BP: 116/84 (11/22 0600) Pulse Rate: 117 (11/22 0600)  Labs: Recent Labs    03/29/24 1311 03/29/24 1312 03/29/24 1831 03/30/24 0054 03/30/24 0055 03/30/24 0948 03/31/24 0000 03/31/24 0450  HGB 15.9* 17.0*   < >  --  10.5* 8.8*  --  9.8*  HCT 47.2* 50.0*   < >  --  32.1* 26.0*  --  30.2*  PLT 228  --   --   --  194  --   --  139*  APTT  --   --   --   --  28  --   --   --   LABPROT  --   --   --   --  15.9*  --   --   --   INR  --   --   --   --  1.2  --   --   --   HEPARINUNFRC  --   --   --   --   --   --  0.38 0.68  CREATININE 1.96* 2.10*  --  1.81*  --   --   --  1.68*   < > = values in this interval not displayed.    Estimated Creatinine Clearance: 29.6 mL/min (A) (by C-G formula based on SCr of 1.68 mg/dL (H)).   Medical History: Past Medical History:  Diagnosis Date   A-fib (HCC)    back in 2013, for stent placement in kidney, she had a bout of a-fib.  Now, back in rhythm   Arthritis    Chronic kidney disease    Complication of anesthesia    Diabetes mellitus without complication (HCC)    type 2    only takes metformin    Dyspnea    Dysuria    History of kidney stones    Hypertension    Osteopenia    PONV (postoperative nausea and vomiting)    Sleep apnea    lost over 120 lbs, and no long uses it (close to 2 yrs now)   Stroke [facility])    mini 2003--affected right side of body.   took 6 weeks to get  over.   Urine, incontinence, stress female     Assessment: Patient admitted with AIS s/p MER and TNK (11/20 @ 13:48). Suspected due to Afib as patient has been in afib while admitted. HgB 8.8 and PLTs 149 from 11/20. Patient not on anticoagulation PTA. Pharmacy consulted to dose heparin  gtt.   Hpearin level accumulated above goal at 0.68 - will reduce.  Hgb stable. Plts down some today at 139 - will monitor.   Goal of Therapy:  Heparin  level 0.3-0.5 units/ml Monitor platelets by anticoagulation protocol: Yes   Plan:  Reduce Heparin  to 700 units/hr. Recheck level in 8 hours.  Daily Heparin  level and CBC.   Harlene Boga, PharmD, BCPS, BCCCP Please refer to [facility] for [facility] numbers 03/31/2024,7:03  AM

## 2024-03-31 NOTE — Progress Notes (Signed)
 PHARMACY - ANTICOAGULATION CONSULT NOTE  Pharmacy Consult for heparin   Indication: atrial fibrillation  Allergies  Allergen Reactions   Prednisone Other (See Comments)    unknown   Lactose Intolerance (Gi)     Milk    Morphine  And Codeine Nausea And Vomiting   Vicodin [Hydrocodone-Acetaminophen ] Nausea And Vomiting    Tolerates acetaminophen     Patient Measurements: Height: 5' 3 (160 cm) Weight: 93.8 kg (206 lb 12.7 oz) IBW/kg (Calculated) : 52.4 HEPARIN  DW (KG): 72  Vital Signs: Temp: 99.5 F (37.5 C) (11/22 1900) Temp Source: Bladder (11/22 0800) BP: 146/78 (11/22 1900) Pulse Rate: 123 (11/22 1900)  Labs: Recent Labs    03/29/24 1311 03/29/24 1312 03/29/24 1831 03/30/24 0054 03/30/24 0055 03/30/24 0948 03/31/24 0000 03/31/24 0450 03/31/24 1729 03/31/24 1859  HGB 15.9* 17.0*   < >  --  10.5* 8.8*  --  9.8*  --   --   HCT 47.2* 50.0*   < >  --  32.1* 26.0*  --  30.2*  --   --   PLT 228  --   --   --  194  --   --  139*  --   --   APTT  --   --   --   --  28  --   --   --   --   --   LABPROT  --   --   --   --  15.9*  --   --   --   --   --   INR  --   --   --   --  1.2  --   --   --   --   --   HEPARINUNFRC  --   --   --   --   --   --    < > 0.68 0.87* 0.54  CREATININE 1.96* 2.10*  --  1.81*  --   --   --  1.68*  --   --    < > = values in this interval not displayed.    Estimated Creatinine Clearance: 29.6 mL/min (A) (by C-G formula based on SCr of 1.68 mg/dL (H)).   Medical History: Past Medical History:  Diagnosis Date   A-fib (HCC)    back in 2013, for stent placement in kidney, she had a bout of a-fib.  Now, back in rhythm   Arthritis    Chronic kidney disease    Complication of anesthesia    Diabetes mellitus without complication (HCC)    type 2    only takes metformin    Dyspnea    Dysuria    History of kidney stones    Hypertension    Osteopenia    PONV (postoperative nausea and vomiting)    Sleep apnea    lost over 120 lbs, and no  long uses it (close to 2 yrs now)   Stroke [facility])    mini 2003--affected right side of body.   took 6 weeks to get over.   Urine, incontinence, stress female     Assessment: Patient admitted with AIS s/p MER and TNK (11/20 @ 13:48). Suspected due to Afib as patient has been in afib while admitted. HgB 8.8 and PLTs 149 from 11/20. Patient not on anticoagulation PTA. Pharmacy consulted to dose heparin  gtt.   Hpearin level accumulated above goal at 0.68 - will reduce.  Hgb stable. Plts down some today at 139 - will monitor.  PM update: HL 0.87 was drawn from PICC, heparin  also infusing through PICC. HL rechecked, obtained peripherally = 0.54 which is slightly supratherapeutic on 700 units/hr. No issues with the infusion or bleeding reported per RN.  Goal of Therapy:  Heparin  level 0.3-0.5 units/ml Monitor platelets by anticoagulation protocol: Yes   Plan:  Reduce Heparin  to 650 units/hr. Recheck level in 8 hours.  Daily Heparin  level and CBC.   Rocky Slade, PharmD, BCPS Please refer to [facility] for [facility] numbers 03/31/2024,7:44 PM

## 2024-03-31 NOTE — Progress Notes (Signed)
 PHARMACY - ANTICOAGULATION CONSULT NOTE  Pharmacy Consult for heparin  Indication: Afib in setting of AIS  Labs: Recent Labs    03/29/24 1311 03/29/24 1312 03/29/24 1831 03/29/24 2312 03/30/24 0054 03/30/24 0055 03/30/24 0948 03/31/24 0000  HGB 15.9* 17.0*   < > 9.9*  --  10.5* 8.8*  --   HCT 47.2* 50.0*   < > 29.0*  --  32.1* 26.0*  --   PLT 228  --   --   --   --  194  --   --   APTT  --   --   --   --   --  28  --   --   LABPROT  --   --   --   --   --  15.9*  --   --   INR  --   --   --   --   --  1.2  --   --   HEPARINUNFRC  --   --   --   --   --   --   --  0.38  CREATININE 1.96* 2.10*  --   --  1.81*  --   --   --    < > = values in this interval not displayed.   Assessment/Plan:  78yo female therapeutic on heparin  with initial dosing for Afib. Will continue infusion at current rate of 850 units/hr and confirm stable with additional level.  Marvetta Dauphin, PharmD, BCPS 03/31/2024 12:56 AM

## 2024-03-31 NOTE — Progress Notes (Signed)
 Peripherally Inserted Central Catheter Placement  The IV Nurse has discussed with the patient and/or persons authorized to consent for the patient, the purpose of this procedure and the potential benefits and risks involved with this procedure.  The benefits include less needle sticks, lab draws from the catheter, and the patient may be discharged home with the catheter. Risks include, but not limited to, infection, bleeding, blood clot (thrombus formation), and puncture of an artery; nerve damage and irregular heartbeat and possibility to perform a PICC exchange if needed/ordered by physician.  Alternatives to this procedure were also discussed.  Bard Power PICC patient education guide, fact sheet on infection prevention and patient information card has been provided to patient /or left at bedside. Obtained consent from patient's son [name] at the bedside.  PICC Placement Documentation  PICC Triple Lumen 03/31/24 Left Basilic 44 cm 1 cm (Active)  Indication for Insertion or Continuance of Line Limited venous access - need for IV therapy >5 days (PICC only) 03/31/24 1105  Exposed Catheter (cm) 1 cm 03/31/24 1105  Site Assessment Clean, Dry, Intact 03/31/24 1105  Lumen #1 Status Flushed;Saline locked;Blood return noted 03/31/24 1105  Lumen #2 Status Flushed;Saline locked;Blood return noted 03/31/24 1105  Lumen #3 Status Flushed;Saline locked;Blood return noted 03/31/24 1105  Dressing Type Transparent;Securing device 03/31/24 1105  Dressing Status Antimicrobial disc/dressing in place;Clean, Dry, Intact 03/31/24 1105  Line Care Connections checked and tightened 03/31/24 1105  Line Adjustment (NICU/IV Team Only) No 03/31/24 1105  Dressing Intervention New dressing 03/31/24 1105  Dressing Change Due 04/07/24 03/31/24 1105       [name] [name] 03/31/2024, 11:06 AM

## 2024-03-31 NOTE — Progress Notes (Signed)
 eLink Physician-Brief Progress Note Patient Name: [name] DOB: [birth year] MRN: [id number]   Date of Service  03/31/2024  HPI/Events of Note  Persistent A-fib with RVR, rates in the 150s-160s.  Responded to Lopressor  earlier Plan was to initiate amiodarone  bolus and drip, but was deferred at that time  eICU Interventions  Initiate amiodarone  per plan     Intervention Category Intermediate Interventions: Arrhythmia - evaluation and management  Jeramine Delis 03/31/2024, 8:46 PM

## 2024-03-31 NOTE — Progress Notes (Addendum)
 NAME:  [name], MRN:  [id number], DOB:  Jul 30, 1945, LOS: 2 ADMISSION DATE:  03/29/2024, CONSULTATION DATE:  11/20 REFERRING MD:  Dr. Matthews, CHIEF COMPLAINT:  R sided weakenss   History of Present Illness:  Patient is encephalopathic and/or intubated; therefore, history has been obtained from chart review.  78 year old female with past medical history as below, which is significant for atrial fibrillation not on anticoagulation, hypertension, diabetes mellitus type 2, CKD, and OSA who presented to [facility] emergency department 11/20 with complaints of acute onset aphasia and right-sided weakness.  She was evaluated as a code stroke in the emergency department and was noted to be disoriented with a right facial droop or right arm weakness, and right leg weakness.  Initial CT of the head was negative for hemorrhage and CT angiogram showed acute large vessel occlusion of the left MCA.  Case was discussed with the multidisciplinary stroke team and the patient was administered intravenous TNK and was taken to IR for mechanical thrombectomy which was completed with aspiration to a TICI III result.  At the completion of the interventional radiology procedure the absence of Doppler signal in the right foot was noted and vascular surgery was consulted.  She was taken to the operating room by Dr. Gretta for repair of the right common femoral artery and proximal SFA.  Drain and wound VAC were placed.  Doppler signals were returned and the patient was transferred to the ICU for close monitoring and recovery.  PCCM is consulted for assistance with ICU management.   Pertinent  Medical History   has a past medical history of A-fib (HCC), Arthritis, Chronic kidney disease, Complication of anesthesia, Diabetes mellitus without complication (HCC), Dyspnea, Dysuria, History of kidney stones, Hypertension, Osteopenia, PONV (postoperative nausea and vomiting), Sleep apnea, Stroke (HCC), and Urine,  incontinence, stress female.   Significant Hospital Events: Including procedures, antibiotic start and stop dates in addition to other pertinent events   11/20 presented with right-sided weakness, given TNK and taken to IR.  After IR absent pulses distal to the surgical site.  Taken to the OR by vascular surgery for repair of the common femoral artery and SFA. 11/21 MRI brain was done showing large left MCA territory stroke.  24-hour post TNK CT head showed diffuse cerebral edema  Interim History / Subjective:  Patient remained afebrile Went into A-fib with RVR heart rate in 140s More awake today  Objective    Blood pressure 119/64, pulse (!) 109, temperature 99.1 F (37.3 C), temperature source Bladder, resp. rate 12, height 5' 3 (1.6 m), weight 93.8 kg, SpO2 100%.        Intake/Output Summary (Last 24 hours) at 03/31/2024 0834 Last data filed at 03/31/2024 0800 Gross per 24 hour  Intake 2270.17 ml  Output 770 ml  Net 1500.17 ml   Filed Weights   03/29/24 2210 03/30/24 1325 03/31/24 0500  Weight: 87.2 kg 87.2 kg 93.8 kg    Examination: General: Acute on chronically ill-appearing elderly obese female, lying on the bed HEENT: [place]/AT, eyes anicteric.  moist mucus membranes. cortrak in place Neuro: Awake, tracking examiner, gaze is deviated to left but she can cross midline, antigravity on left side, flicker response to right side.  Right facial droop noted Chest: Coarse breath sounds, no wheezes or rhonchi Heart: Irregularly irregular, tachycardic no murmurs or gallops Abdomen: Soft, nontender, nondistended, bowel sounds present Skin: Multiple bruise marks noted in all 4 extremities, more on right side Extremities: Right groin wound noted,  wound VAC was removed, JP drain in place draining serosanguineous fluid  Resolved problem list   Assessment and Plan  Acute left MCA territory ischemic stroke status post TNK and mechanical thrombectomy Cerebral edema with brain  compression Aphasia and dysphagia due to acute stroke Acute encephalopathy due to ischemic stroke and cerebral edema, improving Right lower extremity critical limb ischemia status post right common femoral artery and SFA repair by vascular surgery Paroxysmal A-fib with RVR Hypertension Acute blood loss anemia Diabetes type 2 with hyperglycemia AKI on CKD stage IIIb, improving Obesity  Continue secondary stroke prophylaxis Stroke team is following Keep head of the bed elevated to 45 degrees Mental status is better today, she is aphasic Vascular surgery is following, wound VAC was removed this morning, JP drain still in place Patient may need to go back to the OR to repair large surgical wound Remain in A-fib with RVR Started on IV amiodarone  bolus followed by infusion Continue IV heparin  infusion for stroke prophylaxis Continue permissive hypertension, off antihypertensive Blood sugars are not well-controlled Hemoglobin A1c 6.9 Continue sliding scale insulin  Started on Lantus  5 units twice daily, monitor fingerstick goal 140-180 Monitor H&H and transfuse if less than 8 Serum creatinine started improving, down to 1.6 Diet and exercise counseling as appropriate Continue tube feeds   Labs   CBC: Recent Labs  Lab 03/29/24 1311 03/29/24 1312 03/29/24 1900 03/29/24 2312 03/30/24 0055 03/30/24 0948 03/31/24 0450  WBC 8.9  --   --   --  14.9*  --  13.6*  NEUTROABS 5.6  --   --   --   --   --   --   HGB 15.9*   < > 9.5* 9.9* 10.5* 8.8* 9.8*  HCT 47.2*   < > 28.0* 29.0* 32.1* 26.0* 30.2*  MCV 94.2  --   --   --  95.8  --  95.9  PLT 228  --   --   --  194  --  139*   < > = values in this interval not displayed.    Basic Metabolic Panel: Recent Labs  Lab 03/29/24 1311 03/29/24 1312 03/29/24 1831 03/29/24 1900 03/29/24 2312 03/30/24 0054 03/30/24 0948 03/30/24 1925 03/31/24 0450  NA 135 137   < > 145 138 139 140  --  139  K 4.7 4.6   < > 4.4 4.5 4.9 4.0  --  4.6  CL  105 103  --   --   --  108  --   --  108  CO2 20*  --   --   --   --  18*  --   --  20*  GLUCOSE 159* 156*  --   --   --  308*  --   --  201*  BUN 23 28*  --   --   --  23  --   --  23  CREATININE 1.96* 2.10*  --   --   --  1.81*  --   --  1.68*  CALCIUM  8.9  --   --   --   --  7.7*  --   --  8.4*  MG  --   --   --   --   --  1.7  --  2.5* 2.4  PHOS  --   --   --   --   --  4.1  --  3.1 2.5   < > = values in this interval not  displayed.   GFR: Estimated Creatinine Clearance: 29.6 mL/min (A) (by C-G formula based on SCr of 1.68 mg/dL (H)). Recent Labs  Lab 03/29/24 1311 03/30/24 0055 03/30/24 0353 03/30/24 1208 03/31/24 0450  WBC 8.9 14.9*  --   --  13.6*  LATICACIDVEN  --  4.0* 3.7* 2.9*  --     Liver Function Tests: Recent Labs  Lab 03/29/24 1311 03/30/24 0054 03/31/24 0450  AST 30 35 38  ALT 34 25 22  ALKPHOS 60 35* 40  BILITOT 0.8 1.3* 0.6  PROT 6.3* 4.9* 4.9*  ALBUMIN  3.0* 3.1* 2.8*   No results for input(s): LIPASE, AMYLASE in the last 168 hours. No results for input(s): AMMONIA in the last 168 hours.  ABG    Component Value Date/Time   PHART 7.349 (L) 03/30/2024 0948   PCO2ART 32.5 03/30/2024 0948   PO2ART 69 (L) 03/30/2024 0948   HCO3 21.1 03/30/2024 1208   TCO2 19 (L) 03/30/2024 0948   ACIDBASEDEF 6.1 (H) 03/30/2024 1208   O2SAT 41 03/30/2024 1208     Coagulation Profile: Recent Labs  Lab 03/30/24 0055  INR 1.2    Cardiac Enzymes: No results for input(s): CKTOTAL, CKMB, CKMBINDEX, TROPONINI in the last 168 hours.  HbA1C: Hgb A1c MFr Bld  Date/Time Value Ref Range Status  03/30/2024 01:26 AM 6.9 (H) 4.8 - 5.6 % Final    Comment:    (NOTE) Diagnosis of Diabetes The following HbA1c ranges recommended by the American Diabetes Association (ADA) may be used as an aid in the diagnosis of diabetes mellitus.  Hemoglobin             Suggested A1C NGSP%              Diagnosis  <5.7                   Non Diabetic  5.7-6.4                 Pre-Diabetic  >6.4                   Diabetic  <7.0                   Glycemic control for                       adults with diabetes.    10/23/2021 03:01 PM 6.7 (H) 4.8 - 5.6 % Final    Comment:    (NOTE) Pre diabetes:          5.7%-6.4%  Diabetes:              >6.4%  Glycemic control for   <7.0% adults with diabetes     CBG: Recent Labs  Lab 03/30/24 1542 03/30/24 1923 03/30/24 2314 03/31/24 0316 03/31/24 0818  GLUCAP 181* 168* 194* 181* 196*   The patient is critically ill due to acute left MCA stroke/cerebral edema with brain compression, A-fib with RVR.  Critical care was necessary to treat or prevent imminent or life-threatening deterioration.  Critical care was time spent personally by me on the following activities: development of treatment plan with patient and/or surrogate as well as nursing, discussions with consultants, evaluation of patient's response to treatment, examination of patient, obtaining history from patient or surrogate, ordering and performing treatments and interventions, ordering and review of laboratory studies, ordering and review of radiographic studies, pulse oximetry, re-evaluation of patient's condition and participation in multidisciplinary rounds.  During this encounter critical care time was devoted to patient care services described in this note for 38 minutes.     Valinda Novas, MD [facility] See Amion for pager If no response to pager, please call [phone] until 7pm After 7pm, Please call E-link [phone]

## 2024-03-31 NOTE — Progress Notes (Signed)
 STROKE TEAM PROGRESS NOTE    SIGNIFICANT HOSPITAL EVENTS 11/20: Patient admitted with acute onset aphasia and right-sided weakness, left MCA mechanical thrombectomy performed and TNK given.  Ischemic right lower extremity found after procedure, and thrombectomy performed. 11/21:  - Patient started on heparin  for atrial fibrillation - 24 hour CTH with no hemorrhagic transformation with large left middle cerebral artery infarct with mild cytotoxic edema 11/22:  - Metoprolol  added for rate control with elevated heart rate overnight up to 160s  - Vascular surgery concerned for need for possible muscle flap procedure on Monday for right groin wound following wound vac discontinuation this morning.   INTERIM HISTORY/SUBJECTIVE Patient remains in atrial fibrillation with rapid heart rate on the monitor with rate up to the 170s.  Due to PIV limitations, amiodarone  has not yet been started.  Patient planned for possible PICC line today.  Metoprolol  added in addition to IV amiodarone  for rate control.  Neurologic exam reported to be somewhat improved with improved wakefulness today.  She has ongoing dense right hemiplegia and global aphasia.  She withdraws left side with purposeful movement.   OBJECTIVE CBC    Component Value Date/Time   WBC 13.6 (H) 03/31/2024 0450   RBC 3.15 (L) 03/31/2024 0450   HGB 9.8 (L) 03/31/2024 0450   HGB 12.9 08/13/2013 1255   HCT 30.2 (L) 03/31/2024 0450   HCT 39.6 08/13/2013 1255   PLT 139 (L) 03/31/2024 0450   PLT 390 08/13/2013 1255   MCV 95.9 03/31/2024 0450   MCV 89 08/13/2013 1255   MCH 31.1 03/31/2024 0450   MCHC 32.5 03/31/2024 0450   RDW 15.5 03/31/2024 0450   RDW 14.2 08/13/2013 1255   LYMPHSABS 2.2 03/29/2024 1311   LYMPHSABS 1.8 08/13/2013 1255   MONOABS 0.7 03/29/2024 1311   MONOABS 0.7 08/13/2013 1255   EOSABS 0.4 03/29/2024 1311   EOSABS 0.2 08/13/2013 1255   BASOSABS 0.1 03/29/2024 1311   BASOSABS 0.1 08/13/2013 1255   BMET    Component  Value Date/Time   NA 139 03/31/2024 0450   NA 139 08/13/2013 1255   K 4.6 03/31/2024 0450   K 4.1 08/13/2013 1255   CL 108 03/31/2024 0450   CL 104 08/13/2013 1255   CO2 20 (L) 03/31/2024 0450   CO2 30 08/13/2013 1255   GLUCOSE 201 (H) 03/31/2024 0450   GLUCOSE 147 (H) 08/13/2013 1255   BUN 23 03/31/2024 0450   BUN 14 08/13/2013 1255   CREATININE 1.68 (H) 03/31/2024 0450   CREATININE 1.62 (H) 08/13/2013 1255   CALCIUM  8.4 (L) 03/31/2024 0450   CALCIUM  9.1 08/13/2013 1255   GFRNONAA 31 (L) 03/31/2024 0450   GFRNONAA 32 (L) 08/13/2013 1255   Lab Results  Component Value Date   HGBA1C 6.9 (H) 03/30/2024   Lab Results  Component Value Date   CHOL 123 03/30/2024   HDL 28 (L) 03/30/2024   LDLCALC 83 03/30/2024   TRIG 61 03/30/2024   CHOLHDL 4.4 03/30/2024   IMAGING past 24 hours  CT HEAD WITHOUT CONTRAST 03/29/2024 09:01:34 PM IMPRESSION: 1. Acute left MCA territory infarct. 2. No acute hemorrhage or mass effect.  MRI Brain Without Contrast 03/30/2024 02:57:00 AM IMPRESSION: 1. Acute left MCA territory infarcts. 2. Associated edema without substantial mass effect or midline shift. 3. Small amount of petechial hemorrhage. No mass occupying acute hemorrhage  CT HEAD WITHOUT CONTRAST 03/30/2024 12:41:39 PM IMPRESSION: 1. Evolving acute infarct in the left MCA territory with mild edema and sulcal effacement, particularly  within the left temporal lobe and the left frontal operculum. No midline shift. No evidence of hemorrhagic conversion. 2. Increased asymmetric density of the left MCA, possibly related to recent procedure. Repeat CTA is recommended to ensure vessel patency and exclude recurrent thrombosis. 3. Chronic microvascular ischemic changes and remote infarct in the left cerebellum.  Vitals:   03/31/24 0500 03/31/24 0600 03/31/24 0700 03/31/24 0800  BP: (!) 128/94 116/84 118/73 119/64  Pulse: (!) 141 (!) 117 (!) 111 (!) 109  Resp: (!) 22 16 16 12   Temp: 99.5 F  (37.5 C) 99.3 F (37.4 C) 99.1 F (37.3 C) 99.1 F (37.3 C)  TempSrc:    Bladder  SpO2: 100% 100% 100% 100%  Weight: 93.8 kg     Height:       PHYSICAL EXAM General: Ill-appearing elderly patient in no acute distress CV: A-fib with RVR on the monitor Respiratory:  Regular, unlabored respirations on supplemental O2  NEURO:  Mental Status: Patient resting in bed with eyes open, lethargic. Does not follow commands, does not attempt to verbalize throughout assessment.  Does not mimic examiner.   Pupils equal round and reactive, does not blink to threat on the right, has a left gaze preference though we will cross midline towards the right briefly, right facial droop, right hemiplegia.  RUE flaccid, RLE triple flex movement with noxious stimuli, will localize with LUE, LLE withdraws to noxious stimuli.   Most Recent NIH  1a Level of Conscious.: 1 1b LOC Questions: 2 1c LOC Commands: 2 2 Best Gaze: 1 3 Visual: 2 4 Facial Palsy: 0 5a Motor Arm - left: 3 5b Motor Arm - Right: 2 6a Motor Leg - Left: 4 6b Motor Leg - Right: 3 7 Limb Ataxia: 0 8 Sensory: 0 9 Best Language: 3 10 Dysarthria: 2 11 Extinct. and Inatten.: 0 TOTAL: 25  ASSESSMENT/PLAN [name] is a 78 y.o. female with history of A-fib not on anticoagulation, hypertension, diabetes, CKD and sleep apnea admitted for acute onset aphasia and right-sided weakness.  Patient was given TNK and taken to interventional radiology for mechanical thrombectomy, which was successful.  After thrombectomy, she was found to have an ischemic right lower extremity, and she was taken to the OR for right lower extremity thrombectomy.  MRI brain reveals acute left MCA territory infarcts, and 24-hour CT reveals no hemorrhagic transformation but hyperdense left MCA.  Given size of infarcts, MCA likely reoccluded.  Will start patient on heparin  for atrial fibrillation.  NIH on Admission 17  Stroke: Large left MCA territory infarct s/p  TNK and IR with TICI 3 reperfusion, etiology: cardioembolic in the setting new diagnosis of A-fib Code Stroke CT head No acute abnormality. Small vessel disease. Atrophy. ASPECTS 10.    CTA head & neck acute left MCA occlusion, right VA occlusion with distal severe stenosis, left ICA siphon moderate stenosis, right ICA bulb 50% stenosis Status post IR with  TICI3 24 hour CT evolving infarct in left MCA territory with no hemorrhagic transformation, asymmetric density of left MCA MRI large acute left MCA territory infarct 2D Echo EF 55-60%  LDL 83 HgbA1c 6.9 VTE prophylaxis - heparin  IV aspirin  81 mg daily prior to admission, now on heparin  IV  Therapy recommendations:  SNF Disposition:  pending, palliative care consulted  Ischemic RLE with FA occlusion, post IR Occurred post-thrombectomy Underline PVD Vascular surgery on board Emergent right CFA and SFA repair with right LE thrombectomy Wound VAC removed On JP drain Concerning  for wound healing difficulty  History of stroke Family reports patient had a stroke in her 54s ? History of atrial fibrillation during previous hospitalizations in the setting of acute illness  No records for review on EMAR of previous stroke though family reports stroke as mild with possible afib No residual deficits per family  Atrial fibrillation with RVR Home Meds: metoprolol  100 mg daily Continue telemetry monitoring Per family, patient has not had a formal diagnosis of atrial fibrillation though she has had issues since July/August with waking up with a really high heart rate and then bottoming out Previous EEGs during admissions without evidence of atrial fibrillation Begin anticoagulation with IV heparin  03/30/24 Rate to the 170s overnight, increased heart rate with stimulation as well Amiodarone  IV ordered, held pending IV access Metoprolol  25 mg twice daily ordered, monitor for hypotension   Cerebral aneurysm CTA head and neck showed 6x4  aneurysm of left cavernous ICA Outpt follow up with IR team  Hypertension Home meds:  amlodipine  10 mg daily, losartan 50 mg daily Stable Blood pressure goal SBP less than 180 Long-term BP goal normotensive  Hyperlipidemia Home meds: None LDL 83, goal < 70 Add rosuvastatin  20 mg daily Continue statin at discharge  Diabetes type II Controlled Home meds: Metformin  500 mg 3 times daily HgbA1c 6.9, goal < 7.0 CBGs SSI Recommend close follow-up with PCP for better DM control  Dysphagia Patient has post-stroke dysphagia, SLP consulted    Diet   Diet NPO time specified  Coretrak in place 11/21  Other Stroke Risk Factors Obesity, Body mass index is 36.63 kg/m., BMI >/= 30 associated with increased stroke risk, recommend weight loss, diet and exercise as appropriate  Obstructive sleep apnea  Other Active Problems Leukocytosis, WBC 14.9--13.6 CKD 3B, creatinine 1.81--1.68 Goals of care discussion with family Living will is present though family does express concerns with how to correctly follow patient's wishes Palliative care consult placed  Hospital day # 2  Patient seen by NP with MD, MD to edit note as needed. Stevi W Toberman , MSN, AGACNP-BC [facility] See Amion for schedule and pager information 03/31/2024 8:54 AM  ATTENDING NOTE: I reviewed above note and agree with the assessment and plan. Pt was seen and examined.   Son and daughter are at the bedside. Pt is lying in bed, awake with eyes open but lethargic, global aphasia, not following commands, nonverbal. Left gaze preference, not cross midline. Blinking to visual threat on the left but not on the right, not tracking on the right. R facial droop. Tongue protrusion not cooperative. LUE without drift, RUE flaccid. LLE withdraw to pain and against gravity. RLE mild withdraw to pain but not against gravity. RLE triple reflex positive. Sensation, coordination not cooperative due to aphasia and gait not  tested   For detailed assessment and plan, please refer to above as I have made changes wherever appropriate.   Ary Cummins, MD PhD Stroke Neurology 03/31/2024 6:12 PM  This patient is critically ill due to large left MCA stroke status post IR, right lower extremity ischemia leg status post surgery, A-fib RVR and at significant risk of neurological worsening, death form recurrent stroke, hemorrhagic induration, ischemic limb, heart failure, arrhythmia. This patient's care requires constant monitoring of vital signs, hemodynamics, respiratory and cardiac monitoring, review of multiple databases, neurological assessment, discussion with family, other specialists and medical decision making of high complexity. I spent 50 minutes of neurocritical care time in the care of this patient. I had long  discussion with son and daughter at bedside, updated pt current condition, treatment plan and potential prognosis, and answered all the questions.  They expressed understanding and appreciation.      To contact Stroke Continuity provider, please refer to [email or web address]. After hours, contact General Neurology

## 2024-03-31 NOTE — Progress Notes (Signed)
 Progress Note    03/31/2024 9:02 AM 2 Days Post-Op  Subjective: Awake alert, not oriented.  Aphasic, does not recognize family   Vitals:   03/31/24 0700 03/31/24 0800  BP: 118/73 119/64  Pulse: (!) 111 (!) 109  Resp: 16 12  Temp: 99.1 F (37.3 C) 99.1 F (37.3 C)  SpO2: 100% 100%    Physical Exam: General:  no distress Cardiac:  irregular Lungs:  non labored Incisions:  right groin with prevena; JP drain in place -significant amount of moisture Prevena dressing with questionable seal  Extremities:  brisk doppler flow right DP/PT Abdomen:  soft  CBC    Component Value Date/Time   WBC 13.6 (H) 03/31/2024 0450   RBC 3.15 (L) 03/31/2024 0450   HGB 9.8 (L) 03/31/2024 0450   HGB 12.9 08/13/2013 1255   HCT 30.2 (L) 03/31/2024 0450   HCT 39.6 08/13/2013 1255   PLT 139 (L) 03/31/2024 0450   PLT 390 08/13/2013 1255   MCV 95.9 03/31/2024 0450   MCV 89 08/13/2013 1255   MCH 31.1 03/31/2024 0450   MCHC 32.5 03/31/2024 0450   RDW 15.5 03/31/2024 0450   RDW 14.2 08/13/2013 1255   LYMPHSABS 2.2 03/29/2024 1311   LYMPHSABS 1.8 08/13/2013 1255   MONOABS 0.7 03/29/2024 1311   MONOABS 0.7 08/13/2013 1255   EOSABS 0.4 03/29/2024 1311   EOSABS 0.2 08/13/2013 1255   BASOSABS 0.1 03/29/2024 1311   BASOSABS 0.1 08/13/2013 1255    BMET    Component Value Date/Time   NA 139 03/31/2024 0450   NA 139 08/13/2013 1255   K 4.6 03/31/2024 0450   K 4.1 08/13/2013 1255   CL 108 03/31/2024 0450   CL 104 08/13/2013 1255   CO2 20 (L) 03/31/2024 0450   CO2 30 08/13/2013 1255   GLUCOSE 201 (H) 03/31/2024 0450   GLUCOSE 147 (H) 08/13/2013 1255   BUN 23 03/31/2024 0450   BUN 14 08/13/2013 1255   CREATININE 1.68 (H) 03/31/2024 0450   CREATININE 1.62 (H) 08/13/2013 1255   CALCIUM  8.4 (L) 03/31/2024 0450   CALCIUM  9.1 08/13/2013 1255   GFRNONAA 31 (L) 03/31/2024 0450   GFRNONAA 32 (L) 08/13/2013 1255   GFRAA 42 (L) 01/24/2019 1137   GFRAA 37 (L) 08/13/2013 1255    INR     Component Value Date/Time   INR 1.2 03/30/2024 0055     Intake/Output Summary (Last 24 hours) at 03/31/2024 0902 Last data filed at 03/31/2024 0800 Gross per 24 hour  Intake 2170.14 ml  Output 670 ml  Net 1500.14 ml      Assessment/Plan:  78 y.o. female is s/p:  1.  Repair of right common femoral artery and proximal SFA with removal of closure device and vein patch angioplasty using great saphenous vein 2.  Right lower extremity thrombectomy 3.  Drain placement in right groin wound (19 [address]) 4.  Incisional Prevena VAC right groin  2 Days Post-Op   From a vascular surgery standpoint, the patient has an excellent signal in the right foot.  The right groin will need to be monitored closely.  I took off the Prevena this morning due to significant moisture.  I think that dry gauze should be placed in the wound bed between the pannus and the wound bed every 2 hours. Will continue the drain. I had a long conversation with Stefhanie's daughter regarding the above.  She is aware that with her morbid obesity, there is a high likelihood of wound  breakdown which will require further surgery.  At this time, I think it is important to wick away as much moisture as possible to promote wound healing.

## 2024-04-01 DIAGNOSIS — Z66 Do not resuscitate: Secondary | ICD-10-CM | POA: Diagnosis not present

## 2024-04-01 DIAGNOSIS — R29725 NIHSS score 25: Secondary | ICD-10-CM | POA: Diagnosis not present

## 2024-04-01 DIAGNOSIS — G9340 Encephalopathy, unspecified: Secondary | ICD-10-CM | POA: Diagnosis not present

## 2024-04-01 DIAGNOSIS — Z7189 Other specified counseling: Secondary | ICD-10-CM | POA: Diagnosis not present

## 2024-04-01 DIAGNOSIS — Z515 Encounter for palliative care: Secondary | ICD-10-CM | POA: Diagnosis not present

## 2024-04-01 DIAGNOSIS — E1151 Type 2 diabetes mellitus with diabetic peripheral angiopathy without gangrene: Secondary | ICD-10-CM | POA: Diagnosis not present

## 2024-04-01 DIAGNOSIS — I63512 Cerebral infarction due to unspecified occlusion or stenosis of left middle cerebral artery: Secondary | ICD-10-CM | POA: Diagnosis not present

## 2024-04-01 DIAGNOSIS — I10 Essential (primary) hypertension: Secondary | ICD-10-CM | POA: Diagnosis not present

## 2024-04-01 DIAGNOSIS — I4891 Unspecified atrial fibrillation: Secondary | ICD-10-CM | POA: Diagnosis not present

## 2024-04-01 DIAGNOSIS — I63412 Cerebral infarction due to embolism of left middle cerebral artery: Secondary | ICD-10-CM | POA: Diagnosis not present

## 2024-04-01 LAB — COMPREHENSIVE METABOLIC PANEL WITH GFR
ALT: 30 U/L (ref 0–44)
AST: 43 U/L — ABNORMAL HIGH (ref 15–41)
Albumin: 2.5 g/dL — ABNORMAL LOW (ref 3.5–5.0)
Alkaline Phosphatase: 42 U/L (ref 38–126)
Anion gap: 8 (ref 5–15)
BUN: 29 mg/dL — ABNORMAL HIGH (ref 8–23)
CO2: 24 mmol/L (ref 22–32)
Calcium: 8.2 mg/dL — ABNORMAL LOW (ref 8.9–10.3)
Chloride: 110 mmol/L (ref 98–111)
Creatinine, Ser: 1.52 mg/dL — ABNORMAL HIGH (ref 0.44–1.00)
GFR, Estimated: 35 mL/min — ABNORMAL LOW (ref >60)
Glucose, Bld: 247 mg/dL — ABNORMAL HIGH (ref 70–99)
Potassium: 4.7 mmol/L (ref 3.5–5.1)
Sodium: 142 mmol/L (ref 135–145)
Total Bilirubin: 0.5 mg/dL (ref 0.0–1.2)
Total Protein: 4.5 g/dL — ABNORMAL LOW (ref 6.5–8.1)

## 2024-04-01 LAB — CBC
HCT: 26.6 % — ABNORMAL LOW (ref 36.0–46.0)
Hemoglobin: 8.6 g/dL — ABNORMAL LOW (ref 12.0–15.0)
MCH: 31.4 pg (ref 26.0–34.0)
MCHC: 32.3 g/dL (ref 30.0–36.0)
MCV: 97.1 fL (ref 80.0–100.0)
Platelets: 169 K/uL (ref 150–400)
RBC: 2.74 MIL/uL — ABNORMAL LOW (ref 3.87–5.11)
RDW: 15.5 % (ref 11.5–15.5)
WBC: 11.5 K/uL — ABNORMAL HIGH (ref 4.0–10.5)
nRBC: 0.3 % — ABNORMAL HIGH (ref 0.0–0.2)

## 2024-04-01 LAB — GLUCOSE, CAPILLARY
Glucose-Capillary: 235 mg/dL — ABNORMAL HIGH (ref 70–99)
Glucose-Capillary: 236 mg/dL — ABNORMAL HIGH (ref 70–99)
Glucose-Capillary: 202 mg/dL — ABNORMAL HIGH (ref 70–99)
Glucose-Capillary: 173 mg/dL — ABNORMAL HIGH (ref 70–99)
Glucose-Capillary: 183 mg/dL — ABNORMAL HIGH (ref 70–99)
Glucose-Capillary: 186 mg/dL — ABNORMAL HIGH (ref 70–99)

## 2024-04-01 LAB — MAGNESIUM: Magnesium: 2.4 mg/dL (ref 1.7–2.4)

## 2024-04-01 LAB — PHOSPHORUS: Phosphorus: 1.9 mg/dL — ABNORMAL LOW (ref 2.5–4.6)

## 2024-04-01 LAB — HEPARIN LEVEL (UNFRACTIONATED)
Heparin Unfractionated: 0.72 [IU]/mL — ABNORMAL HIGH (ref 0.30–0.70)
Heparin Unfractionated: 0.68 [IU]/mL (ref 0.30–0.70)
Heparin Unfractionated: 0.75 [IU]/mL — ABNORMAL HIGH (ref 0.30–0.70)

## 2024-04-01 MED ORDER — METOPROLOL TARTRATE 50 MG PO TABS
50.0000 mg | ORAL_TABLET | Freq: Three times a day (TID) | ORAL | Status: DC
  Administered 2024-04-01 – 2024-04-02 (×4): 50 mg
  Filled 2024-04-01 (×4): qty 1

## 2024-04-01 MED ORDER — SODIUM PHOSPHATES 45 MMOLE/15ML IV SOLN
30.0000 mmol | Freq: Once | INTRAVENOUS | Status: AC
  Administered 2024-04-01: 30 mmol via INTRAVENOUS
  Filled 2024-04-01: qty 10

## 2024-04-01 MED ORDER — INSULIN GLARGINE-YFGN 100 UNIT/ML ~~LOC~~ SOLN
12.0000 [IU] | Freq: Two times a day (BID) | SUBCUTANEOUS | Status: DC
  Administered 2024-04-01 – 2024-04-02 (×3): 12 [IU] via SUBCUTANEOUS
  Filled 2024-04-01 (×4): qty 0.12

## 2024-04-01 NOTE — Progress Notes (Signed)
 PHARMACY - ANTICOAGULATION CONSULT NOTE  Pharmacy Consult for heparin   Indication: atrial fibrillation  Allergies  Allergen Reactions   Prednisone Other (See Comments)    unknown   Lactose Intolerance (Gi)     Milk    Morphine  And Codeine Nausea And Vomiting   Vicodin [Hydrocodone-Acetaminophen ] Nausea And Vomiting    Tolerates acetaminophen     Patient Measurements: Height: 5' 3 (160 cm) Weight: 92.2 kg (203 lb 4.2 oz) IBW/kg (Calculated) : 52.4 HEPARIN  DW (KG): 72  Vital Signs: Temp: 99.3 F (37.4 C) (11/23 1600) Temp Source: Bladder (11/23 0800) BP: 125/94 (11/23 1600) Pulse Rate: 112 (11/23 1600)  Labs: Recent Labs    03/30/24 0054 03/30/24 0055 03/30/24 0948 03/31/24 0000 03/31/24 0450 03/31/24 1729 04/01/24 0400 04/01/24 0412 04/01/24 0546 04/01/24 1649  HGB  --  10.5* 8.8*  --  9.8*  --   --  8.6*  --   --   HCT  --  32.1* 26.0*  --  30.2*  --   --  26.6*  --   --   PLT  --  194  --   --  139*  --   --  169  --   --   APTT  --  28  --   --   --   --   --   --   --   --   LABPROT  --  15.9*  --   --   --   --   --   --   --   --   INR  --  1.2  --   --   --   --   --   --   --   --   HEPARINUNFRC  --   --   --    < > 0.68   < > 0.72*  --  0.68 0.75*  CREATININE 1.81*  --   --   --  1.68*  --   --  1.52*  --   --    < > = values in this interval not displayed.    Estimated Creatinine Clearance: 32.4 mL/min (A) (by C-G formula based on SCr of 1.52 mg/dL (H)).  Assessment: Patient admitted with AIS s/p MER and TNK (11/20 @ 13:48). Suspected due to Afib as patient has been in afib while admitted. HgB 8.8 and PLTs 149 from 11/20. Patient not on anticoagulation PTA. Pharmacy consulted to dose heparin  gtt.   Heparin  level continues to be above goal at 0.75, level drawn appropriately from opposite extremity from where heparin  is running. RN reports hematoma from groin site.  Goal of Therapy:  Heparin  level 0.3-0.5 units/ml Monitor platelets by  anticoagulation protocol: Yes   Plan:  Reduce Heparin  to 350 units/hr. Recheck level in 8 hours.  Daily Heparin  level and CBC.   Rocky Slade, PharmD, BCPS Clinical Pharmacist 04/01/2024 5:20 PM

## 2024-04-01 NOTE — Progress Notes (Addendum)
 NAME:  [name], MRN:  [id number], DOB:  12/03/1945, LOS: 3 ADMISSION DATE:  03/29/2024, CONSULTATION DATE:  11/20 REFERRING MD:  Dr. Matthews, CHIEF COMPLAINT:  R sided weakenss   History of Present Illness:  Patient is encephalopathic and/or intubated; therefore, history has been obtained from chart review.  78 year old female with past medical history as below, which is significant for atrial fibrillation not on anticoagulation, hypertension, diabetes mellitus type 2, CKD, and OSA who presented to [facility] emergency department 11/20 with complaints of acute onset aphasia and right-sided weakness.  She was evaluated as a code stroke in the emergency department and was noted to be disoriented with a right facial droop or right arm weakness, and right leg weakness.  Initial CT of the head was negative for hemorrhage and CT angiogram showed acute large vessel occlusion of the left MCA.  Case was discussed with the multidisciplinary stroke team and the patient was administered intravenous TNK and was taken to IR for mechanical thrombectomy which was completed with aspiration to a TICI III result.  At the completion of the interventional radiology procedure the absence of Doppler signal in the right foot was noted and vascular surgery was consulted.  She was taken to the operating room by Dr. Gretta for repair of the right common femoral artery and proximal SFA.  Drain and wound VAC were placed.  Doppler signals were returned and the patient was transferred to the ICU for close monitoring and recovery.  PCCM is consulted for assistance with ICU management.   Pertinent  Medical History   has a past medical history of A-fib (HCC), Arthritis, Chronic kidney disease, Complication of anesthesia, Diabetes mellitus without complication (HCC), Dyspnea, Dysuria, History of kidney stones, Hypertension, Osteopenia, PONV (postoperative nausea and vomiting), Sleep apnea, Stroke (HCC), and Urine,  incontinence, stress female.   Significant Hospital Events: Including procedures, antibiotic start and stop dates in addition to other pertinent events   11/20 presented with right-sided weakness, given TNK and taken to IR.  After IR absent pulses distal to the surgical site.  Taken to the OR by vascular surgery for repair of the common femoral artery and SFA. 11/21 MRI brain was done showing large left MCA territory stroke.  24-hour post TNK CT head showed diffuse cerebral edema 11/22 remained afebrile, went into A-fib with RVR with heart rate up to 140s, received metoprolol  with improvement in heart rate.  Remains globally aphasic  Interim History / Subjective:  Patient into A-fib with RVR again overnight with heart rate reaching into 150s Started on amiodarone  infusion Right groin drain was removed  Objective    Blood pressure 103/64, pulse 94, temperature 99 F (37.2 C), resp. rate 16, height 5' 3 (1.6 m), weight 92.2 kg, SpO2 97%.        Intake/Output Summary (Last 24 hours) at 04/01/2024 0806 Last data filed at 04/01/2024 0700 Gross per 24 hour  Intake 2167.26 ml  Output 1100 ml  Net 1067.26 ml   Filed Weights   03/30/24 1325 03/31/24 0500 04/01/24 0420  Weight: 87.2 kg 93.8 kg 92.2 kg    Examination: General: Acute on chronically ill-appearing elderly obese female, lying on the bed HEENT: [place]/AT, eyes anicteric.  moist mucus membranes. Cortrak in place Neuro: Awake, globally aphasic, right facial droop, right-sided neglect, triple flexion to right lower extremity, plegic right upper extremity Chest: Coarse breath sounds, no wheezes or rhonchi Heart: Irregularly irregular no murmurs or gallops Abdomen: Soft, nontender, nondistended, bowel sounds present  Skin: Multiple bruise marks noted Extremities: Right groin surgical wound, staples in place, looks clean and dry  Labs reviewed  Resolved problem list   Assessment and Plan  Acute left MCA territory ischemic  stroke status post TNK and mechanical thrombectomy Cerebral edema with brain compression Aphasia and dysphagia due to acute stroke Acute encephalopathy due to ischemic stroke and cerebral edema, improving Right lower extremity critical limb ischemia status post right common femoral artery, SFA repair and right lower extremity thrombectomy by vascular surgery Paroxysmal A-fib with RVR Hypertension Acute blood loss anemia Diabetes type 2 with hyperglycemia AKI on CKD stage IIIb, improving Hypophosphatemia Obesity  Continue secondary stroke prophylaxis Stroke team is following Keep head of the bed elevated to 45 degrees Remained aphasic Cortrak in place, continue tube feeds, speech and swallow evaluation is pending today Vascular surgery is following, wound VAC in JP drain was removed Remain in A-fib heart rate is better controlled, overnight she went into A-fib with RVR, requiring amiodarone  infusion Increase metoprolol  to 50 mg Q8 Continue IV amiodarone  infusion for now, try to transition off later today Continue IV heparin  infusion for stroke prophylaxis Continue permissive hypertension, off antihypertensive Blood sugars are not well-controlled, remain hyperglycemic Hemoglobin A1c 6.9 Continue sliding scale insulin  Increase Lantus  to 12 units twice daily, monitor fingerstick goal 140-180 Monitor H&H and transfuse if less than 8 Serum creatinine continue to improve, down to 1.5 today Diet and exercise counseling as appropriate Continue tube feeds Continue aggressive electrolyte replacement   Labs   CBC: Recent Labs  Lab 03/29/24 1311 03/29/24 1312 03/29/24 2312 03/30/24 0055 03/30/24 0948 03/31/24 0450 04/01/24 0412  WBC 8.9  --   --  14.9*  --  13.6* 11.5*  NEUTROABS 5.6  --   --   --   --   --   --   HGB 15.9*   < > 9.9* 10.5* 8.8* 9.8* 8.6*  HCT 47.2*   < > 29.0* 32.1* 26.0* 30.2* 26.6*  MCV 94.2  --   --  95.8  --  95.9 97.1  PLT 228  --   --  194  --  139* 169    < > = values in this interval not displayed.    Basic Metabolic Panel: Recent Labs  Lab 03/29/24 1311 03/29/24 1312 03/29/24 1831 03/29/24 2312 03/30/24 0054 03/30/24 0948 03/30/24 1925 03/31/24 0450 04/01/24 0412  NA 135 137   < > 138 139 140  --  139 142  K 4.7 4.6   < > 4.5 4.9 4.0  --  4.6 4.7  CL 105 103  --   --  108  --   --  108 110  CO2 20*  --   --   --  18*  --   --  20* 24  GLUCOSE 159* 156*  --   --  308*  --   --  201* 247*  BUN 23 28*  --   --  23  --   --  23 29*  CREATININE 1.96* 2.10*  --   --  1.81*  --   --  1.68* 1.52*  CALCIUM  8.9  --   --   --  7.7*  --   --  8.4* 8.2*  MG  --   --   --   --  1.7  --  2.5* 2.4 2.4  PHOS  --   --   --   --  4.1  --  3.1  2.5 1.9*   < > = values in this interval not displayed.   GFR: Estimated Creatinine Clearance: 32.4 mL/min (A) (by C-G formula based on SCr of 1.52 mg/dL (H)). Recent Labs  Lab 03/29/24 1311 03/30/24 0055 03/30/24 0353 03/30/24 1208 03/31/24 0450 03/31/24 0910 04/01/24 0412  WBC 8.9 14.9*  --   --  13.6*  --  11.5*  LATICACIDVEN  --  4.0* 3.7* 2.9*  --  1.9  --     Liver Function Tests: Recent Labs  Lab 03/29/24 1311 03/30/24 0054 03/31/24 0450 04/01/24 0412  AST 30 35 38 43*  ALT 34 25 22 30   ALKPHOS 60 35* 40 42  BILITOT 0.8 1.3* 0.6 0.5  PROT 6.3* 4.9* 4.9* 4.5*  ALBUMIN  3.0* 3.1* 2.8* 2.5*   No results for input(s): LIPASE, AMYLASE in the last 168 hours. No results for input(s): AMMONIA in the last 168 hours.  ABG    Component Value Date/Time   PHART 7.349 (L) 03/30/2024 0948   PCO2ART 32.5 03/30/2024 0948   PO2ART 69 (L) 03/30/2024 0948   HCO3 21.1 03/30/2024 1208   TCO2 19 (L) 03/30/2024 0948   ACIDBASEDEF 6.1 (H) 03/30/2024 1208   O2SAT 41 03/30/2024 1208     Coagulation Profile: Recent Labs  Lab 03/30/24 0055  INR 1.2    Cardiac Enzymes: No results for input(s): CKTOTAL, CKMB, CKMBINDEX, TROPONINI in the last 168 hours.  HbA1C: Hgb A1c MFr  Bld  Date/Time Value Ref Range Status  03/30/2024 01:26 AM 6.9 (H) 4.8 - 5.6 % Final    Comment:    (NOTE) Diagnosis of Diabetes The following HbA1c ranges recommended by the American Diabetes Association (ADA) may be used as an aid in the diagnosis of diabetes mellitus.  Hemoglobin             Suggested A1C NGSP%              Diagnosis  <5.7                   Non Diabetic  5.7-6.4                Pre-Diabetic  >6.4                   Diabetic  <7.0                   Glycemic control for                       adults with diabetes.    10/23/2021 03:01 PM 6.7 (H) 4.8 - 5.6 % Final    Comment:    (NOTE) Pre diabetes:          5.7%-6.4%  Diabetes:              >6.4%  Glycemic control for   <7.0% adults with diabetes     CBG: Recent Labs  Lab 03/31/24 1529 03/31/24 1929 03/31/24 2321 04/01/24 0314 04/01/24 0752  GLUCAP 177* 181* 238* 235* 236*      Valinda Novas, MD [facility] See Amion for pager If no response to pager, please call [phone] until 7pm After 7pm, Please call E-link [phone]

## 2024-04-01 NOTE — Progress Notes (Signed)
 PHARMACY - ANTICOAGULATION CONSULT NOTE  Pharmacy Consult for heparin   Indication: atrial fibrillation  Allergies  Allergen Reactions   Prednisone Other (See Comments)    unknown   Lactose Intolerance (Gi)     Milk    Morphine  And Codeine Nausea And Vomiting   Vicodin [Hydrocodone-Acetaminophen ] Nausea And Vomiting    Tolerates acetaminophen     Patient Measurements: Height: 5' 3 (160 cm) Weight: 92.2 kg (203 lb 4.2 oz) IBW/kg (Calculated) : 52.4 HEPARIN  DW (KG): 72  Vital Signs: Temp: 99.3 F (37.4 C) (11/23 0400) BP: 119/89 (11/23 0400) Pulse Rate: 111 (11/23 0400)  Labs: Recent Labs    03/29/24 1312 03/29/24 1831 03/30/24 0054 03/30/24 0055 03/30/24 0948 03/31/24 0000 03/31/24 0450 03/31/24 1729 03/31/24 1859 04/01/24 0400 04/01/24 0412  HGB 17.0*   < >  --  10.5* 8.8*  --  9.8*  --   --   --  8.6*  HCT 50.0*   < >  --  32.1* 26.0*  --  30.2*  --   --   --  26.6*  PLT  --   --   --  194  --   --  139*  --   --   --  169  APTT  --   --   --  28  --   --   --   --   --   --   --   LABPROT  --   --   --  15.9*  --   --   --   --   --   --   --   INR  --   --   --  1.2  --   --   --   --   --   --   --   HEPARINUNFRC  --   --   --   --   --    < > 0.68 0.87* 0.54 0.72*  --   CREATININE 2.10*  --  1.81*  --   --   --  1.68*  --   --   --   --    < > = values in this interval not displayed.    Estimated Creatinine Clearance: 29.3 mL/min (A) (by C-G formula based on SCr of 1.68 mg/dL (H)).  Assessment: Patient admitted with AIS s/p MER and TNK (11/20 @ 13:48). Suspected due to Afib as patient has been in afib while admitted. HgB 8.8 and PLTs 149 from 11/20. Patient not on anticoagulation PTA. Pharmacy consulted to dose heparin  gtt.   Heparin  level continues to be above goal at 0.68 > 0.72(drawn from [facility] appropriately) and redraw 0.68 from opposite side after dose reduction  Hgb slight drop to 8.6. Plts recover from 139 > 169 No issues with infusion or  bleeding per RN, level drawn appropriately from opposite extremity from where heparin  is running  Goal of Therapy:  Heparin  level 0.3-0.5 units/ml Monitor platelets by anticoagulation protocol: Yes   Plan:  Reduce Heparin  to 500 units/hr. Recheck level in 8 hours.  Daily Heparin  level and CBC.   Lynwood Poplar, PharmD, BCPS Clinical Pharmacist 04/01/2024 4:56 AM

## 2024-04-01 NOTE — Consult Note (Signed)
 Palliative Medicine Inpatient Consult Note  Consulting Provider:  Denna Mimi ORN, NP   Reason for consult:   Palliative Care Consult Services Palliative Medicine Consult  Reason for Consult? Left MCA stroke with significant neurologic deficits, family (children) requesting assistance with living will / GOC decisions    04/01/2024  HPI:  Per intake H&P -->  78 year old female with past medical history as below, which is significant for atrial fibrillation not on anticoagulation, hypertension, diabetes mellitus type 2, CKD, and OSA who presented to [facility] emergency department 11/20 with complaints of acute onset aphasia and right-sided weakness. (+) Left MCA mechanical thrombectomy performed and TNK given. Ischemic right lower extremity found after procedure, and thrombectomy performed.   Palliative care requested to support additional goals of care conversations.   Clinical Assessment/Goals of Care:  *Please note that this is a verbal dictation therefore any spelling or grammatical errors are due to the Dragon Medical One system interpretation.  I have reviewed medical records including EPIC notes, labs and imaging, received report from bedside RN, assessed the patient who is lying in bed, able to open eyes.    I met with patient's spouse, Scharlene, daughter, [name], and son, [name] to further discuss diagnosis prognosis, GOC, EOL wishes, disposition and options.   I introduced Palliative Medicine as specialized medical care for people living with serious illness. It focuses on providing relief from the symptoms and stress of a serious illness. The goal is to improve quality of life for both the patient and the family.  Medical History Review and Understanding:  A review of Lacresha's past medical history significant for type 2 diabetes mellitus, hypertension, chronic kidney disease, obstructive sleep apnea, atrial fibrillation, & prior stroke.  Social History:  Nemiah is from  [place], [place] .  Yeslin has been married to her husband for the past 58 years.  They have 2 children, 3 grandchildren, 1 great grandchild and 1 on the way in March.  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  She is someone who enjoys cooking, eating, doting on her grandchildren and great-grandchildren.  She is identified as a thoughtful woman.  Functional and Nutritional State:  Prior to hospitalization Genavieve had become more physically debilitated over the past few years due to shoulder injuries, recurrent chronic UTIs, and last year a terrible bout of shingles.  She does require help with B ADLs as a result of this but has been able to mobilize.  Advance Directives:  A detailed discussion was had today regarding advanced directives.  Zamora does have advanced directives.  Code Status:  Concepts specific to code status, artifical feeding and hydration, continued IV antibiotics and rehospitalization was had.  The difference between a aggressive medical intervention path  and a palliative comfort care path for this patient at this time was had.   Encouraged patient/family to consider DNR/DNI status understanding evidenced based poor outcomes in similar hospitalized patient, as the cause of arrest is likely associated with advanced chronic/terminal illness rather than an easily reversible acute cardio-pulmonary event. I explained that DNR/DNI does not change the medical plan and it only comes into effect after a person has arrested (died).  It is a protective measure to keep us  from harming the patient in their last moments of life.  Patient's spouse, Scharlene was agreeable to DNR/DNI with understanding that patient would not receive CPR, defibrillation, ACLS medications, or intubation.  Discussion:  A review of the circumstances leading up to admission inclusive of right  sided weakness was completed.  We discussed that patient was assessed and identified to have had a  left MCA requiring thrombectomy and secondary to that had ischemia in her right lower extremity postprocedure.  After the procedure there was a hemorrhagic transformation with a middle cerebral artery infarction.    Patient's family believe this was all in the setting of her uncontrolled A-fib.  Patient's family expressed frustration as she had gone to both her doctor in the ED on multiple occasions secondary to feelings of anxiousness which has now identified to have likely been her A-fib.  Allowed patient's family time and grace to express their frustrations.  We reviewed that patient feels they are receiving mixed messages.  Has a few days ago family was essentially told to get their affairs in order in anticipation of what was to come from the perspective of patient's clinical state.  They feel today they have been given a different message and that the patient has seen some degree of improvement.    We talked about the improvements that have been seen in the short and long-term concerns inclusive of patient's likely immobility in the future and reliance on others to support all the ADLs.  I shared the need to consider what Muntaha would and would not want if she could advocate for herself.  Patient's family notes she would not want to live dependent on others though they also express having a difficult time discerning what next steps to take.  We reviewed the importance of considering quality of life for the person who is living.  Plan to allow Chrystle a few more days and to reconvene on Tuesday.  The option(s) of continued intervention versus more of a comfort mediated path has been gently broached.  Discussed the importance of continued conversation with family and their  medical providers regarding overall plan of care and treatment options, ensuring decisions are within the context of the patients values and GOCs.  Decision Maker: Scharlene Marina [phone]  SUMMARY OF RECOMMENDATIONS    DNAR/DNI  Allowing additional time for outcomes --> plan to allow Doralee a few more days to see if improvements can be made  The two paths, one of aggressive intervention and the other comfort mediated care were discussed  Plan for additional conversations on Tuesday --> Family would appreciate a clear and concise message regarding potential outcomes for Latoiya  Ongoing palliative support    Code Status/Advance Care Planning: DNAR/DNI   Symptom Management:  ICU associated delirium precautions should be implemented  Palliative Prophylaxis:  Aspiration, Bowel Regimen, Delirium Protocol, Frequent Pain Assessment, Oral Care, Palliative Wound Care, and Turn Reposition  Additional Recommendations (Limitations, Scope, Preferences): Continue current care  Psycho-social/Spiritual:  Desire for further Chaplaincy support: Not at this time patient's pastor has been visiting Additional Recommendations: Education on stroke and outcomes   Prognosis: Multiple chronic disease processes, debility, lethargy, concern for poor ability to improve beyond the stroke.  Discharge Planning: Discharge plan to be determined.  Vitals:   04/01/24 0500 04/01/24 0600  BP: (!) 128/94 120/78  Pulse: (!) 106 (!) 103  Resp: 18 19  Temp: 99.3 F (37.4 C) 99.1 F (37.3 C)  SpO2: 93% 95%    Intake/Output Summary (Last 24 hours) at 04/01/2024 9297 Last data filed at 04/01/2024 0600 Gross per 24 hour  Intake 2210.53 ml  Output 1035 ml  Net 1175.53 ml   Last Weight  Most recent update: 04/01/2024  4:21 AM    Weight  92.2 kg (  203 lb 4.2 oz)             LABS: CBC:    Component Value Date/Time   WBC 11.5 (H) 04/01/2024 0412   HGB 8.6 (L) 04/01/2024 0412   HGB 12.9 08/13/2013 1255   HCT 26.6 (L) 04/01/2024 0412   HCT 39.6 08/13/2013 1255   PLT 169 04/01/2024 0412   PLT 390 08/13/2013 1255   MCV 97.1 04/01/2024 0412   MCV 89 08/13/2013 1255   NEUTROABS 5.6 03/29/2024 1311   NEUTROABS 4.4  08/13/2013 1255   LYMPHSABS 2.2 03/29/2024 1311   LYMPHSABS 1.8 08/13/2013 1255   MONOABS 0.7 03/29/2024 1311   MONOABS 0.7 08/13/2013 1255   EOSABS 0.4 03/29/2024 1311   EOSABS 0.2 08/13/2013 1255   BASOSABS 0.1 03/29/2024 1311   BASOSABS 0.1 08/13/2013 1255   Comprehensive Metabolic Panel:    Component Value Date/Time   NA 142 04/01/2024 0412   NA 139 08/13/2013 1255   K 4.7 04/01/2024 0412   K 4.1 08/13/2013 1255   CL 110 04/01/2024 0412   CL 104 08/13/2013 1255   CO2 24 04/01/2024 0412   CO2 30 08/13/2013 1255   BUN 29 (H) 04/01/2024 0412   BUN 14 08/13/2013 1255   CREATININE 1.52 (H) 04/01/2024 0412   CREATININE 1.62 (H) 08/13/2013 1255   GLUCOSE 247 (H) 04/01/2024 0412   GLUCOSE 147 (H) 08/13/2013 1255   CALCIUM  8.2 (L) 04/01/2024 0412   CALCIUM  9.1 08/13/2013 1255   AST 43 (H) 04/01/2024 0412   AST 24 07/28/2013 1213   ALT 30 04/01/2024 0412   ALT 24 07/28/2013 1213   ALKPHOS 42 04/01/2024 0412   ALKPHOS 60 07/28/2013 1213   BILITOT 0.5 04/01/2024 0412   BILITOT 1.3 (H) 07/28/2013 1213   PROT 4.5 (L) 04/01/2024 0412   PROT 6.2 (L) 07/28/2013 1213   ALBUMIN  2.5 (L) 04/01/2024 0412   ALBUMIN  2.9 (L) 07/28/2013 1213    Gen: Elderly Caucasian female chronically ill-appearing HEENT: Dobbhoff in place, dry mucous membranes CV: Regular rate and irregular rhythm PULM: On 2 L nasal cannula breathing is even and nonlabored ABD: soft/nontender  EXT: LE edema  Neuro: Somnolent will arouse but falls back asleep within a relatively short period of time  PPS:10%   This conversation/these recommendations were discussed with patient primary care team, Dr. Jerri _____________________________________________________ Rosaline Becton [facility] Palliative Medicine Team Team Cell Phone: [phone] Please utilize secure chat with additional questions, if there is no response within 30 minutes please call the above phone number  Total Time: 75 Billing based on MDM:  High  Palliative Medicine Team providers are available by phone from 7am to 7pm daily and can be reached through the team cell phone.  Should this patient require assistance outside of these hours, please call the patient's attending physician.

## 2024-04-01 NOTE — Progress Notes (Addendum)
 STROKE TEAM PROGRESS NOTE    SIGNIFICANT HOSPITAL EVENTS 11/20: Patient admitted with acute onset aphasia and right-sided weakness, left MCA mechanical thrombectomy performed and TNK given.  Ischemic right lower extremity found after procedure, and thrombectomy performed. 11/21:  - Patient started on heparin  for atrial fibrillation - 24 hour CTH with no hemorrhagic transformation with large left middle cerebral artery infarct with mild cytotoxic edema 11/22:  - Metoprolol  added for rate control with elevated heart rate overnight up to 160s  - Vascular surgery concerned for need for possible muscle flap procedure on Monday for right groin wound following wound vac discontinuation this morning.   INTERIM HISTORY/SUBJECTIVE Patient remains in atrial fibrillation with better controlled rate today. Amiodarone  still infusing, metoprolol  increased with improvement in rate. Plan to stop amiodarone  later today per CCM.Transfer out of ICU later today. Palliative care engaged.  Awake and smiling, appears comfortable, but not following commands well   OBJECTIVE CBC    Component Value Date/Time   WBC 11.5 (H) 04/01/2024 0412   RBC 2.74 (L) 04/01/2024 0412   HGB 8.6 (L) 04/01/2024 0412   HGB 12.9 08/13/2013 1255   HCT 26.6 (L) 04/01/2024 0412   HCT 39.6 08/13/2013 1255   PLT 169 04/01/2024 0412   PLT 390 08/13/2013 1255   MCV 97.1 04/01/2024 0412   MCV 89 08/13/2013 1255   MCH 31.4 04/01/2024 0412   MCHC 32.3 04/01/2024 0412   RDW 15.5 04/01/2024 0412   RDW 14.2 08/13/2013 1255   LYMPHSABS 2.2 03/29/2024 1311   LYMPHSABS 1.8 08/13/2013 1255   MONOABS 0.7 03/29/2024 1311   MONOABS 0.7 08/13/2013 1255   EOSABS 0.4 03/29/2024 1311   EOSABS 0.2 08/13/2013 1255   BASOSABS 0.1 03/29/2024 1311   BASOSABS 0.1 08/13/2013 1255   BMET    Component Value Date/Time   NA 142 04/01/2024 0412   NA 139 08/13/2013 1255   K 4.7 04/01/2024 0412   K 4.1 08/13/2013 1255   CL 110 04/01/2024 0412   CL  104 08/13/2013 1255   CO2 24 04/01/2024 0412   CO2 30 08/13/2013 1255   GLUCOSE 247 (H) 04/01/2024 0412   GLUCOSE 147 (H) 08/13/2013 1255   BUN 29 (H) 04/01/2024 0412   BUN 14 08/13/2013 1255   CREATININE 1.52 (H) 04/01/2024 0412   CREATININE 1.62 (H) 08/13/2013 1255   CALCIUM  8.2 (L) 04/01/2024 0412   CALCIUM  9.1 08/13/2013 1255   GFRNONAA 35 (L) 04/01/2024 0412   GFRNONAA 32 (L) 08/13/2013 1255   Lab Results  Component Value Date   HGBA1C 6.9 (H) 03/30/2024   Lab Results  Component Value Date   CHOL 123 03/30/2024   HDL 28 (L) 03/30/2024   LDLCALC 83 03/30/2024   TRIG 61 03/30/2024   CHOLHDL 4.4 03/30/2024   IMAGING past 24 hours  CT HEAD WITHOUT CONTRAST 03/29/2024 09:01:34 PM IMPRESSION: 1. Acute left MCA territory infarct. 2. No acute hemorrhage or mass effect.  MRI Brain Without Contrast 03/30/2024 02:57:00 AM IMPRESSION: 1. Acute left MCA territory infarcts. 2. Associated edema without substantial mass effect or midline shift. 3. Small amount of petechial hemorrhage. No mass occupying acute hemorrhage  CT HEAD WITHOUT CONTRAST 03/30/2024 12:41:39 PM IMPRESSION: 1. Evolving acute infarct in the left MCA territory with mild edema and sulcal effacement, particularly within the left temporal lobe and the left frontal operculum. No midline shift. No evidence of hemorrhagic conversion. 2. Increased asymmetric density of the left MCA, possibly related to recent procedure. Repeat CTA  is recommended to ensure vessel patency and exclude recurrent thrombosis. 3. Chronic microvascular ischemic changes and remote infarct in the left cerebellum.  Vitals:   04/01/24 0915 04/01/24 1000 04/01/24 1100 04/01/24 1200  BP: 117/72 127/89 115/87 125/82  Pulse: 84 (!) 115 (!) 104 99  Resp: 17 12 16 16   Temp: 99 F (37.2 C) 98.8 F (37.1 C) 99 F (37.2 C) 98.8 F (37.1 C)  TempSrc:      SpO2: 96% 98% 99% 100%  Weight:      Height:       PHYSICAL EXAM General:  Ill-appearing elderly patient in no acute distress CV: A-fib with RVR on the monitor Respiratory:  Regular, unlabored respirations on supplemental O2  NEURO:  Mental Status: Patient resting in bed with eyes open, lethargic. Does not follow commands, does not attempt to verbalize throughout assessment.  Does not mimic examiner.   Pupils equal round and reactive, does not blink to threat on the right, has a left gaze preference though we will cross midline towards the right briefly, right facial droop, right hemiplegia.  RUE flaccid, RLE triple flex movement with noxious stimuli, will localize with LUE, LLE withdraws to noxious stimuli.   Most Recent NIH  1a Level of Conscious.: 1 1b LOC Questions: 2 1c LOC Commands: 2 2 Best Gaze: 1 3 Visual: 2 4 Facial Palsy: 0 5a Motor Arm - left: 3 5b Motor Arm - Right: 2 6a Motor Leg - Left: 4 6b Motor Leg - Right: 3 7 Limb Ataxia: 0 8 Sensory: 0 9 Best Language: 3 10 Dysarthria: 2 11 Extinct. and Inatten.: 0 TOTAL: 25  ASSESSMENT/PLAN Ms. Alva Kuenzel is a 78 y.o. female with history of A-fib not on anticoagulation, hypertension, diabetes, CKD and sleep apnea admitted for acute onset aphasia and right-sided weakness.  Patient was given TNK and taken to interventional radiology for mechanical thrombectomy, which was successful.  After thrombectomy, she was found to have an ischemic right lower extremity, and she was taken to the OR for right lower extremity thrombectomy.  MRI brain reveals acute left MCA territory infarcts, and 24-hour CT reveals no hemorrhagic transformation but hyperdense left MCA.  Given size of infarcts, MCA likely reoccluded.  Will start patient on heparin  for atrial fibrillation.  NIH on Admission 17  Stroke: Large left MCA territory infarct s/p TNK and IR with TICI 3 reperfusion, etiology: cardioembolic in the setting new diagnosis of A-fib Code Stroke CT head No acute abnormality. Small vessel disease. Atrophy.  ASPECTS 10.    CTA head & neck acute left MCA occlusion, right VA occlusion with distal severe stenosis, left ICA siphon moderate stenosis, right ICA bulb 50% stenosis Status post IR with  TICI3 24 hour CT evolving infarct in left MCA territory with no hemorrhagic transformation, asymmetric density of left MCA MRI large acute left MCA territory infarct 2D Echo EF 55-60%  LDL 83 HgbA1c 6.9 VTE prophylaxis - heparin  IV aspirin  81 mg daily prior to admission, now on heparin  IV  Therapy recommendations:  SNF Disposition:  pending, palliative care on board  Ischemic RLE with FA occlusion, post IR Occurred post-thrombectomy Underline PVD Vascular surgery on board Emergent right CFA and SFA repair with right LE thrombectomy Wound VAC removed JP drain removed  History of stroke Family reports patient had a stroke in her 57s ? History of atrial fibrillation during previous hospitalizations in the setting of acute illness  No records for review on EMAR of previous stroke though  family reports stroke as mild with possible afib No residual deficits per family  Atrial fibrillation with RVR Home Meds: metoprolol  100 mg daily Continue telemetry monitoring Per family, patient has not had a formal diagnosis of atrial fibrillation though she has had issues since July/August with waking up with a really high heart rate and then bottoming out Previous EKGs during admissions without evidence of atrial fibrillation Begin anticoagulation with IV heparin  03/30/24 On amiodarone  IV  Metoprolol  25->50 mg twice daily, monitor for hypotension   Cerebral aneurysm CTA head and neck showed 6x4 aneurysm of left cavernous ICA Outpt follow up with IR team  Hypertension Home meds:  amlodipine  10 mg daily, losartan 50 mg daily Stable Now on metoprolol  25->50 bid Long-term BP goal normotensive  Hyperlipidemia Home meds: None LDL 83, goal < 70 Add rosuvastatin  20 mg daily Continue statin at  discharge  Diabetes type II Controlled Home meds: Metformin  500 mg 3 times daily HgbA1c 6.9, goal < 7.0 CBGs SSI Recommend close follow-up with PCP for better DM control  Dysphagia Patient has post-stroke dysphagia, SLP consulted NPO Coretrak in place 11/21 On TF @ 50  Other Stroke Risk Factors Obesity, Body mass index is 36.01 kg/m., BMI >/= 30 associated with increased stroke risk, recommend weight loss, diet and exercise as appropriate  Obstructive sleep apnea  Other Active Problems Leukocytosis, WBC 14.9--13.6--11.5 CKD 3B, creatinine 1.81--1.68--1.52  Hospital day # 3  Patient seen and examined by NP/APP with MD. MD to update note as needed.   Jorene Last, DNP, FNP-BC [facility] Pager: [phone]  ATTENDING NOTE: I reviewed above note and agree with the assessment and plan. Pt was seen and examined.   Son and daughter-in-law are at bedside.  Patient today seems more awake alert, eyes open spontaneously, still has left gaze preference, however able to has right gaze more complete than yesterday.  Still has global aphasia, right facial droop and right hemiplegia.  A-fib RVR improved after amiodarone  and increased metoprolol  dose.  Right groin wound closed by vascular surgery.  Palliative care on board, family would like more time for decision.  Continue heparin  IV, statin and tube feeding.  For detailed assessment and plan, please refer to above as I have made changes wherever appropriate.   Ary Cummins, MD PhD Stroke Neurology 04/01/2024 6:02 PM  This patient is critically ill due to large left MCA stroke status post IR, right lower extremity ischemia leg status post surgery, A-fib RVR and at significant risk of neurological worsening, death form recurrent stroke, hemorrhagic induration, ischemic limb, heart failure, arrhythmia. This patient's care requires constant monitoring of vital signs, hemodynamics, respiratory and cardiac monitoring, review of  multiple databases, neurological assessment, discussion with family, other specialists and medical decision making of high complexity. I spent 50 minutes of neurocritical care time in the care of this patient. I had long discussion with son and daughter at bedside, updated pt current condition, treatment plan and potential prognosis, and answered all the questions.  They expressed understanding and appreciation.     To contact Stroke Continuity provider, please refer to [email or web address]. After hours, contact General Neurology

## 2024-04-01 NOTE — Progress Notes (Signed)
  Progress Note    04/01/2024 10:59 AM 3 Days Post-Op  Subjective: Awake alert, able to follow commands in all extremities except the right arm Smiling   Vitals:   04/01/24 0915 04/01/24 1000  BP: 117/72 127/89  Pulse: 84 (!) 115  Resp: 17 12  Temp: 99 F (37.2 C) 98.8 F (37.1 C)  SpO2: 96% 98%    Physical Exam: General:  no distress Cardiac:  irregular Lungs:  non labored Incisions:  right groin with staple line, ABD dressing changes every few hours JP drain in place dressing with questionable seal  Extremities:  brisk doppler flow right DP/PT Abdomen:  soft  CBC    Component Value Date/Time   WBC 11.5 (H) 04/01/2024 0412   RBC 2.74 (L) 04/01/2024 0412   HGB 8.6 (L) 04/01/2024 0412   HGB 12.9 08/13/2013 1255   HCT 26.6 (L) 04/01/2024 0412   HCT 39.6 08/13/2013 1255   PLT 169 04/01/2024 0412   PLT 390 08/13/2013 1255   MCV 97.1 04/01/2024 0412   MCV 89 08/13/2013 1255   MCH 31.4 04/01/2024 0412   MCHC 32.3 04/01/2024 0412   RDW 15.5 04/01/2024 0412   RDW 14.2 08/13/2013 1255   LYMPHSABS 2.2 03/29/2024 1311   LYMPHSABS 1.8 08/13/2013 1255   MONOABS 0.7 03/29/2024 1311   MONOABS 0.7 08/13/2013 1255   EOSABS 0.4 03/29/2024 1311   EOSABS 0.2 08/13/2013 1255   BASOSABS 0.1 03/29/2024 1311   BASOSABS 0.1 08/13/2013 1255    BMET    Component Value Date/Time   NA 142 04/01/2024 0412   NA 139 08/13/2013 1255   K 4.7 04/01/2024 0412   K 4.1 08/13/2013 1255   CL 110 04/01/2024 0412   CL 104 08/13/2013 1255   CO2 24 04/01/2024 0412   CO2 30 08/13/2013 1255   GLUCOSE 247 (H) 04/01/2024 0412   GLUCOSE 147 (H) 08/13/2013 1255   BUN 29 (H) 04/01/2024 0412   BUN 14 08/13/2013 1255   CREATININE 1.52 (H) 04/01/2024 0412   CREATININE 1.62 (H) 08/13/2013 1255   CALCIUM  8.2 (L) 04/01/2024 0412   CALCIUM  9.1 08/13/2013 1255   GFRNONAA 35 (L) 04/01/2024 0412   GFRNONAA 32 (L) 08/13/2013 1255   GFRAA 42 (L) 01/24/2019 1137   GFRAA 37 (L) 08/13/2013 1255     INR    Component Value Date/Time   INR 1.2 03/30/2024 0055     Intake/Output Summary (Last 24 hours) at 04/01/2024 1059 Last data filed at 04/01/2024 1000 Gross per 24 hour  Intake 1903.85 ml  Output 1050 ml  Net 853.85 ml      Assessment/Plan:  78 y.o. female is s/p:  1.  Repair of right common femoral artery and proximal SFA with removal of closure device and vein patch angioplasty using great saphenous vein 2.  Right lower extremity thrombectomy 3.  Drain placement in right groin wound (19 [address]) 4.  Incisional Prevena VAC right groin  3 Days Post-Op   From a vascular surgery standpoint, the patient has an excellent signal in the right foot.  The right groin will need to be monitored closely.   Appreciate continued dry dressings to the groin every few hours. Family is aware she is at high risk of wound breakdown due to morbid obesity and pannus Seems in better spirits today.

## 2024-04-02 DIAGNOSIS — I4891 Unspecified atrial fibrillation: Secondary | ICD-10-CM | POA: Diagnosis not present

## 2024-04-02 DIAGNOSIS — R29725 NIHSS score 25: Secondary | ICD-10-CM | POA: Diagnosis not present

## 2024-04-02 DIAGNOSIS — Z711 Person with feared health complaint in whom no diagnosis is made: Secondary | ICD-10-CM

## 2024-04-02 DIAGNOSIS — Z48812 Encounter for surgical aftercare following surgery on the circulatory system: Secondary | ICD-10-CM

## 2024-04-02 DIAGNOSIS — Z515 Encounter for palliative care: Secondary | ICD-10-CM | POA: Diagnosis not present

## 2024-04-02 DIAGNOSIS — I63412 Cerebral infarction due to embolism of left middle cerebral artery: Secondary | ICD-10-CM | POA: Diagnosis not present

## 2024-04-02 DIAGNOSIS — E1151 Type 2 diabetes mellitus with diabetic peripheral angiopathy without gangrene: Secondary | ICD-10-CM | POA: Diagnosis not present

## 2024-04-02 DIAGNOSIS — Z66 Do not resuscitate: Secondary | ICD-10-CM | POA: Diagnosis not present

## 2024-04-02 DIAGNOSIS — D72829 Elevated white blood cell count, unspecified: Secondary | ICD-10-CM | POA: Insufficient documentation

## 2024-04-02 DIAGNOSIS — Z7189 Other specified counseling: Secondary | ICD-10-CM | POA: Diagnosis not present

## 2024-04-02 LAB — GLUCOSE, CAPILLARY
Glucose-Capillary: 188 mg/dL — ABNORMAL HIGH (ref 70–99)
Glucose-Capillary: 178 mg/dL — ABNORMAL HIGH (ref 70–99)
Glucose-Capillary: 203 mg/dL — ABNORMAL HIGH (ref 70–99)
Glucose-Capillary: 219 mg/dL — ABNORMAL HIGH (ref 70–99)

## 2024-04-02 LAB — TYPE AND SCREEN
ABO/RH(D): A POS
Antibody Screen: NEGATIVE
Unit division: 0
Unit division: 0

## 2024-04-02 LAB — BPAM RBC
Blood Product Expiration Date: 202512182359
Blood Product Expiration Date: 202512182359
ISSUE DATE / TIME: 202511201956
Unit Type and Rh: 6200
Unit Type and Rh: 6200

## 2024-04-02 LAB — CBC
HCT: 28.3 % — ABNORMAL LOW (ref 36.0–46.0)
Hemoglobin: 9.2 g/dL — ABNORMAL LOW (ref 12.0–15.0)
MCH: 31.2 pg (ref 26.0–34.0)
MCHC: 32.5 g/dL (ref 30.0–36.0)
MCV: 95.9 fL (ref 80.0–100.0)
Platelets: 192 K/uL (ref 150–400)
RBC: 2.95 MIL/uL — ABNORMAL LOW (ref 3.87–5.11)
RDW: 15.3 % (ref 11.5–15.5)
WBC: 12.2 K/uL — ABNORMAL HIGH (ref 4.0–10.5)
nRBC: 1 % — ABNORMAL HIGH (ref 0.0–0.2)

## 2024-04-02 LAB — PHOSPHORUS: Phosphorus: 3.4 mg/dL (ref 2.5–4.6)

## 2024-04-02 LAB — HEPARIN LEVEL (UNFRACTIONATED)
Heparin Unfractionated: 0.3 [IU]/mL (ref 0.30–0.70)
Heparin Unfractionated: 0.39 [IU]/mL (ref 0.30–0.70)

## 2024-04-02 LAB — MAGNESIUM: Magnesium: 2.4 mg/dL (ref 1.7–2.4)

## 2024-04-02 MED ORDER — LORAZEPAM 1 MG PO TABS: 1.0000 mg | ORAL_TABLET | ORAL | Status: DC | PRN

## 2024-04-02 MED ORDER — ONDANSETRON 4 MG PO TBDP: 4.0000 mg | ORAL_TABLET | Freq: Four times a day (QID) | ORAL | Status: DC | PRN

## 2024-04-02 MED ORDER — MORPHINE SULFATE (PF) 2 MG/ML IV SOLN
2.0000 mg | Freq: Four times a day (QID) | INTRAVENOUS | Status: DC
  Administered 2024-04-03 (×2): 2 mg via INTRAVENOUS
  Filled 2024-04-02 (×3): qty 1

## 2024-04-02 MED ORDER — ONDANSETRON HCL 4 MG/2ML IJ SOLN
4.0000 mg | Freq: Four times a day (QID) | INTRAMUSCULAR | Status: DC
  Administered 2024-04-03 (×3): 4 mg via INTRAVENOUS
  Filled 2024-04-02 (×3): qty 2

## 2024-04-02 MED ORDER — METOPROLOL TARTRATE 5 MG/5ML IV SOLN
5.0000 mg | Freq: Once | INTRAVENOUS | Status: AC
  Administered 2024-04-02: 5 mg via INTRAVENOUS
  Filled 2024-04-02: qty 5

## 2024-04-02 MED ORDER — LORAZEPAM 2 MG/ML IJ SOLN
1.0000 mg | INTRAMUSCULAR | Status: DC | PRN
  Administered 2024-04-03: 1 mg via INTRAVENOUS
  Filled 2024-04-02: qty 1

## 2024-04-02 MED ORDER — POLYVINYL ALCOHOL 1.4 % OP SOLN: 1.0000 [drp] | Freq: Four times a day (QID) | OPHTHALMIC | Status: DC | PRN

## 2024-04-02 MED ORDER — GLYCOPYRROLATE 1 MG PO TABS: 1.0000 mg | ORAL_TABLET | ORAL | Status: DC | PRN

## 2024-04-02 MED ORDER — MORPHINE SULFATE (PF) 2 MG/ML IV SOLN
2.0000 mg | INTRAVENOUS | Status: DC | PRN
  Administered 2024-04-02: 4 mg via INTRAVENOUS
  Administered 2024-04-03 (×3): 2 mg via INTRAVENOUS
  Filled 2024-04-02: qty 2
  Filled 2024-04-02 (×2): qty 1

## 2024-04-02 MED ORDER — GLYCOPYRROLATE 0.2 MG/ML IJ SOLN: 0.2000 mg | INTRAMUSCULAR | Status: DC | PRN

## 2024-04-02 MED ORDER — BIOTENE DRY MOUTH MT LIQD: 15.0000 mL | OROMUCOSAL | Status: DC | PRN

## 2024-04-02 MED ORDER — GLYCOPYRROLATE 0.2 MG/ML IJ SOLN
0.2000 mg | INTRAMUSCULAR | Status: DC | PRN
Start: 2024-04-02 — End: 2024-04-04

## 2024-04-02 MED ORDER — LORAZEPAM 2 MG/ML PO CONC
1.0000 mg | ORAL | Status: DC | PRN
  Administered 2024-04-03: 1 mg via SUBLINGUAL
  Filled 2024-04-02: qty 1

## 2024-04-02 MED ORDER — INSULIN GLARGINE-YFGN 100 UNIT/ML ~~LOC~~ SOLN
14.0000 [IU] | Freq: Two times a day (BID) | SUBCUTANEOUS | Status: DC
  Filled 2024-04-02: qty 0.14

## 2024-04-02 MED ORDER — ONDANSETRON HCL 4 MG/2ML IJ SOLN
4.0000 mg | Freq: Four times a day (QID) | INTRAMUSCULAR | Status: DC | PRN
  Administered 2024-04-02: 4 mg via INTRAVENOUS
  Filled 2024-04-02: qty 2

## 2024-04-02 NOTE — Evaluation (Addendum)
 Speech Language Pathology Evaluation Patient Details Name: [name] MRN: [id number] DOB: [birth year] Today's Date: 04/02/2024 Time: 8968-8957 SLP Time Calculation (min) (ACUTE ONLY): 11 min  Problem List:  Patient Active Problem List   Diagnosis Date Noted   Acute ischemic left MCA stroke (HCC) 03/29/2024   Critical limb ischemia of right lower extremity (HCC) 03/29/2024   Aphasia 03/29/2024   Right sided weakness 03/29/2024   Dehydration 09/11/2022   PUD (peptic ulcer disease) 09/10/2022   HLD (hyperlipidemia) 09/10/2022   Type II diabetes mellitus with renal manifestations (HCC) 09/10/2022   Gout 09/10/2022   Anxiety 09/10/2022   Obesity (BMI 30-39.9) 09/10/2022   Atrial fibrillation, chronic (HCC) 09/10/2022   Shingles 09/10/2022   UTI (urinary tract infection) 09/10/2022   Pancreatic cyst 09/10/2022   Elevated LFTs 09/10/2022   Myocardial injury 09/10/2022   Fall at home, initial encounter 09/10/2022   Intractable nausea and vomiting 09/10/2022   Hyperlipidemia, mixed 11/06/2021   Total knee replacement status 11/06/2021   Primary osteoarthritis of right knee 10/04/2021   Status post reverse arthroplasty of right shoulder 02/11/2021   Rotator cuff arthropathy 12/08/2020   Pressure injury of skin 12/08/2020   Atherosclerosis of abdominal aorta 01/16/2020   Calculus of hepatic duct    Diabetes mellitus with peripheral angiopathy (HCC) 01/12/2018   Osteoarthritis 07/09/2016   Recurrent nephrolithiasis 07/09/2016   Benign essential hypertension 07/05/2016   History of CVA (cerebrovascular accident) 07/05/2016   Medicare annual wellness visit, initial 07/05/2016   Spinal stenosis of lumbar region with neurogenic claudication 04/19/2016   OSA on CPAP 12/29/2015   CKD (chronic kidney disease) stage 4, GFR 15-29 ml/min (HCC) 05/29/2015   H/O adenomatous polyp of colon 01/02/2015   Polyarticular gout 05/22/2014   Diabetes mellitus type 2, controlled, with  complications (HCC) 02/25/2014   Foreign body in bladder and urethra 08/31/2013   Uric acid nephrolithiasis 08/31/2013   Kidney stone 08/09/2013   Ureteric stone 08/09/2013   Hydronephrosis 08/09/2013   Renal colic 08/09/2013   Past Medical History:  Past Medical History:  Diagnosis Date   A-fib (HCC)    back in 2013, for stent placement in kidney, she had a bout of a-fib.  Now, back in rhythm   Arthritis    Chronic kidney disease    Complication of anesthesia    Diabetes mellitus without complication (HCC)    type 2    only takes metformin    Dyspnea    Dysuria    History of kidney stones    Hypertension    Osteopenia    PONV (postoperative nausea and vomiting)    Sleep apnea    lost over 120 lbs, and no long uses it (close to 2 yrs now)   Stroke [facility])    mini 2003--affected right side of body.   took 6 weeks to get over.   Urine, incontinence, stress female    Past Surgical History:  Past Surgical History:  Procedure Laterality Date   APPLICATION OF WOUND VAC  03/29/2024   Procedure: APPLICATION, WOUND VAC;  Surgeon: Gretta Lonni PARAS, MD;  Location: [facility] OR;  Service: Vascular;;   BREAST BIOPSY Right 11/22/2019   U/S Bx-HYDROMARK-BENIGN INTRAMAMMARY LYMPH NODE.   COLONOSCOPY W/ POLYPECTOMY Right    COLONOSCOPY WITH PROPOFOL  N/A 01/09/2015   Procedure: COLONOSCOPY WITH PROPOFOL ;  Surgeon: Lamar ONEIDA Holmes, MD;  Location: [facility] ENDOSCOPY;  Service: Endoscopy;  Laterality: N/A;   ERCP N/A 03/15/2019   Procedure: ENDOSCOPIC RETROGRADE CHOLANGIOPANCREATOGRAPHY (ERCP);  Surgeon:  Jinny Carmine, MD;  Location: [facility] ENDOSCOPY;  Service: Endoscopy;  Laterality: N/A;   EYE SURGERY  2013   bilateral cataracts with implants   JOINT REPLACEMENT     left knee  TKR   KIDNEY STONE SURGERY     40 yrs ago, while pregnant, she had to 'be cut in 1/2 to get stone out'   KNEE ARTHROPLASTY Right 11/06/2021   Procedure: COMPUTER ASSISTED TOTAL KNEE ARTHROPLASTY - DEPUY;  Surgeon: Mardee Lynwood SQUIBB, MD;  Location: [facility] ORS;  Service: Orthopedics;  Laterality: Right;   KNEE ARTHROSCOPY     left knee   LITHOTRIPSY     ltk Left    LUMBAR LAMINECTOMY/DECOMPRESSION MICRODISCECTOMY N/A 09/06/2016   Procedure: LAMINECTOMY AND FORAMINOTOMY LUMBAR THREE- LUMBAR FOUR, LUMBAR FOUR- LUMBAR FIVE;  Surgeon: Reyes Budge, MD;  Location: [facility] OR;  Service: Neurosurgery;  Laterality: N/A;  LAMINECTOMY AND FORAMINOTOMY LUMBAR 3- LUMBAR 4, LUMBAR 4- LUMBAR 5    RADIOLOGY WITH ANESTHESIA N/A 03/29/2024   Procedure: RADIOLOGY WITH ANESTHESIA;  Surgeon: Radiologist, Medication, MD;  Location: [facility] OR;  Service: Radiology;  Laterality: N/A;   REVERSE SHOULDER ARTHROPLASTY Right 12/08/2020   Procedure: Right reverse total shoulder arthroplasty and  biceps tenodesis;  Surgeon: Tobie Priest, MD;  Location: [facility] ORS;  Service: Orthopedics;  Laterality: Right;   throidectomy  1979   goiter   THROMBECTOMY FEMORAL ARTERY Right 03/29/2024   Procedure: REPAIR OF RIGHT COMMON FEMORAL ARTERY AND SUPERFICIAL FEMORAL ARTERY WITH VEIN PATCH ANGIOPLASTY;  Surgeon: Gretta Lonni PARAS, MD;  Location: [facility] OR;  Service: Vascular;  Laterality: Right;   THROMBECTOMY OF BYPASS GRAFT FEMORAL- TIBIAL ARTERY  03/29/2024   Procedure: RIGHT LEG THROMBECTOMY;  Surgeon: Gretta Lonni PARAS, MD;  Location: [facility] OR;  Service: Vascular;;   TRIGGER FINGER RELEASE Left    HPI:  78 yo female presenting to ED 11/20 with acute onset aphasia and  R sided weakness. CTA showed acute LVO of the L MCA s/p TNK and mechanical thrombectomy 11/20. Noted to lose perfusion in RLE, underwent repair of R common femoral and proximal SFA with angioplasty and RLE thrombectomy 11/20. MRI with L MCA territory infarcts. Cortrak placed 11/21. PMH: HTN, T2DM, CKD, OSA   Assessment / Plan / Recommendation Clinical Impression  Pt's children report she was independent at baseline, living at home with her spouse. She now presents with R inattention, impacting her  awareness of familiar tasks. Pt exhibits focused attention to functional tasks but has difficulty initiating even when given context. She held the cup with Min assist to take small sips of water but was unable to brush her teeth. Pt does not follow simple commands or answer yes/no questions. No vocalization was observed so cannot comment on intelligibility. Ongoing SLP f/u is recommended as she is suspected to be far from her cognitive-linguistic baseline.     SLP Assessment  SLP Recommendation/Assessment: Patient needs continued Speech Language Pathology Services SLP Visit Diagnosis: Aphasia (R47.01);Cognitive communication deficit (R41.841)     Assistance Recommended at Discharge  Frequent or constant Supervision/Assistance  Functional Status Assessment Patient has had a recent decline in their functional status and demonstrates the ability to make significant improvements in function in a reasonable and predictable amount of time.  Frequency and Duration min 2x/week  2 weeks      SLP Evaluation Cognition  Overall Cognitive Status: Difficult to assess Arousal/Alertness: Awake/alert Attention: Sustained Sustained Attention: Impaired Sustained Attention Impairment: Functional basic Awareness: Impaired Awareness Impairment: Intellectual impairment  Comprehension  Auditory Comprehension Overall Auditory Comprehension: Impaired Yes/No Questions: Impaired Basic Biographical Questions: 0-25% accurate Commands: Impaired One Step Basic Commands: 0-24% accurate    Expression Expression Primary Mode of Expression: Verbal Verbal Expression Overall Verbal Expression: Impaired Initiation: Impaired Repetition: Impaired Level of Impairment: Word level Naming: Impairment Confrontation: Impaired   Oral / Motor  Oral Motor/Sensory Function Overall Oral Motor/Sensory Function: Moderate impairment Facial ROM: Reduced right;Suspected CN VII (facial) dysfunction Facial Symmetry:  Abnormal symmetry right;Suspected CN VII (facial) dysfunction Facial Strength: Reduced right;Suspected CN VII (facial) dysfunction Facial Sensation: Reduced right;Suspected CN V (Trigeminal) dysfunction Motor Speech Overall Motor Speech: Other (comment) (phonation not observed)            Damien Blumenthal, M.A., CCC-SLP Speech Language Pathology, Acute Rehabilitation Services  Secure Chat preferred [phone]  04/02/2024, 11:39 AM

## 2024-04-02 NOTE — Progress Notes (Signed)
 PHARMACY - ANTICOAGULATION CONSULT NOTE  Pharmacy Consult for heparin  Indication: Afib in setting of AIS  Labs: Recent Labs    03/30/24 0948 03/31/24 0000 03/31/24 0450 03/31/24 1729 04/01/24 0412 04/01/24 0546 04/01/24 1649 04/02/24 0152  HGB 8.8*  --  9.8*  --  8.6*  --   --   --   HCT 26.0*  --  30.2*  --  26.6*  --   --   --   PLT  --   --  139*  --  169  --   --   --   HEPARINUNFRC  --    < > 0.68   < >  --  0.68 0.75* 0.30  CREATININE  --   --  1.68*  --  1.52*  --   --   --    < > = values in this interval not displayed.   Assessment/Plan:  78yo female therapeutic on heparin  after rate adjustments. Will continue infusion at current rate of 350 units/hr and confirm stable with additional level.  Marvetta Dauphin, PharmD, BCPS 04/02/2024 2:42 AM

## 2024-04-02 NOTE — Progress Notes (Signed)
 Physical Therapy Treatment Patient Details Name: [name] MRN: [id number] DOB: [birth year] Today's Date: 04/02/2024   History of Present Illness 78 y.o. female presents to [facility] hospital on 03/29/2024 with acute onset aphasia and R weakness. CTA shows and acute LVO of L MCA. Pt underwent thrombectomy on 11/20. After thrombectomy pt noted to lose perfusion to RLE, underwent repair of R common femoral and proximal SFA with angioplasty and RLE thrombectomy on 11/20. PMH includes HTN, DMII, CKD, OSA.    PT Comments  Pt tolerates treatment well but continues to demonstrate significant deficits in command following. Pt does utilize L side in efforts to assist in bed mobility and maintains sitting balance for periods of time with CGA for safety. Pt remains at a high risk for falls due to R hemiplegia and inattention. Patient will benefit from continued inpatient follow up therapy, <3 hours/day.    If plan is discharge home, recommend the following: Two people to help with walking and/or transfers;Two people to help with bathing/dressing/bathroom;Assistance with cooking/housework;Assistance with feeding;Assist for transportation;Help with stairs or ramp for entrance;Supervision due to cognitive status;Direct supervision/assist for medications management;Direct supervision/assist for financial management   Can travel by private vehicle     No  Equipment Recommendations  Hospital bed;Hoyer lift    Recommendations for Other Services       Precautions / Restrictions Precautions Precautions: Fall Recall of Precautions/Restrictions: Impaired Precaution/Restrictions Comments: R groin JP drain Restrictions Weight Bearing Restrictions Per Provider Order: No     Mobility  Bed Mobility Overal bed mobility: Needs Assistance Bed Mobility: Supine to Sit, Sit to Supine     Supine to sit: Total assist, +2 for physical assistance Sit to supine: Total assist, +2 for physical assistance    General bed mobility comments: pt does utilize LUE to attempt to pull into sitting, also utilizes LLE to mobilize toward edge of bed, totalA for R side    Transfers                        Ambulation/Gait                   Stairs             Wheelchair Mobility     Tilt Bed    Modified Rankin (Stroke Patients Only) Modified Rankin (Stroke Patients Only) Pre-Morbid Rankin Score: Slight disability Modified Rankin: Severe disability     Balance Overall balance assessment: Needs assistance Sitting-balance support: Single extremity supported, Feet unsupported Sitting balance-Leahy Scale: Poor Sitting balance - Comments: CGA-modA Postural control: Posterior lean, Right lateral lean                                  Communication Communication Communication: Impaired Factors Affecting Communication: Difficulty expressing self  Cognition Arousal: Alert Behavior During Therapy: Flat affect   PT - Cognitive impairments: Difficult to assess Difficult to assess due to: Impaired communication                     PT - Cognition Comments: pt does not appear to follow verbal commands, very inconsistently follows gestural commands Following commands: Impaired Following commands impaired:  (pt does not appear to follow verbal commands, very inconsistently follows gestural commands)    Cueing Cueing Techniques: Verbal cues, Gestural cues, Tactile cues, Visual cues  Exercises      General Comments General comments (skin  integrity, edema, etc.): pt with HR fluctuating between 90-130s with mobility. SpO2 and BP stable. Pt continues to track to midline, does track from left to right side if stimulus initially presents on L side.      Pertinent Vitals/Pain Pain Assessment Pain Assessment: CPOT Facial Expression: Relaxed, neutral Body Movements: Absence of movements Muscle Tension: Relaxed Compliance with ventilator (intubated pts.):  N/A Vocalization (extubated pts.): N/A (pt does not verbalize) CPOT Total: 0 Pain Intervention(s): Monitored during session    Home Living     Available Help at Discharge: Family;Available 24 hours/day Type of Home: House                  Prior Function            PT Goals (current goals can now be found in the care plan section) Acute Rehab PT Goals Patient Stated Goal: to improve function and reduce caregiver burden Progress towards PT goals: Progressing toward goals (slow progress)    Frequency    Min 2X/week      PT Plan      Co-evaluation PT/OT/SLP Co-Evaluation/Treatment: Yes Reason for Co-Treatment: Complexity of the patient's impairments (multi-system involvement);For patient/therapist safety;Necessary to address cognition/behavior during functional activity;To address functional/ADL transfers PT goals addressed during session: Mobility/safety with mobility;Strengthening/ROM        AM-PAC PT 6 Clicks Mobility   Outcome Measure  Help needed turning from your back to your side while in a flat bed without using bedrails?: Total Help needed moving from lying on your back to sitting on the side of a flat bed without using bedrails?: Total Help needed moving to and from a bed to a chair (including a wheelchair)?: Total Help needed standing up from a chair using your arms (e.g., wheelchair or bedside chair)?: Total Help needed to walk in hospital room?: Total Help needed climbing 3-5 steps with a railing? : Total 6 Click Score: 6    End of Session   Activity Tolerance: Patient tolerated treatment well Patient left: in bed;with call bell/phone within reach;with bed alarm set Nurse Communication: Mobility status;Need for lift equipment PT Visit Diagnosis: Other abnormalities of gait and mobility (R26.89);Muscle weakness (generalized) (M62.81);Hemiplegia and hemiparesis;Other symptoms and signs involving the nervous system (R29.898) Hemiplegia -  Right/Left: Right Hemiplegia - dominant/non-dominant: Dominant Hemiplegia - caused by: Cerebral infarction     Time: 8881-8850 PT Time Calculation (min) (ACUTE ONLY): 31 min  Charges:    $Therapeutic Activity: 8-22 mins PT General Charges $$ ACUTE PT VISIT: 1 Visit                     Bernardino JINNY Ruth, PT, DPT Acute Rehabilitation Office [phone]    Bernardino JINNY Ruth 04/02/2024, 12:38 PM

## 2024-04-02 NOTE — TOC Initial Note (Addendum)
 Transition of Care [facility]) - Initial/Assessment Note    Patient Details  Name: [name] MRN: [id number] Date of Birth: 1945-08-18  [facility] CM/SW Contact:    Inocente GORMAN Kindle, LCSW Phone Number: 04/02/2024, 10:03 AM  Clinical Narrative:                 10am-CSW following for needs along with Palliative care conversations. Patient currently disoriented with Cortrak.  12:03 PM- CSW received consult for Hospice placement. CSW spoke with patient's son and confirmed choice of [facility] [place] location. He confirmed that patient's spouse is coming up and will alert the RN once they are ready for the Cortrak to be removed. [facility] aware.    Barriers to Discharge: Continued Medical Work up   Patient Goals and CMS Choice   CMS Medicare.gov Compare Post Acute Care list provided to:: Patient Represenative (must comment)        Expected Discharge Plan and Services In-house Referral: Clinical Social Work     Living arrangements for the past 2 months: Single Family Home                                      Prior Living Arrangements/Services Living arrangements for the past 2 months: Single Family Home Lives with:: Spouse Patient language and need for interpreter reviewed:: Yes Do you feel safe going back to the place where you live?: Yes      Need for Family Participation in Patient Care: Yes (Comment) Care giver support system in place?: Yes (comment)   Criminal Activity/Legal Involvement Pertinent to Current Situation/Hospitalization: No - Comment as needed  Activities of Daily Living      Permission Sought/Granted Permission sought to share information with : Facility Medical Sales Representative, Family Supports Permission granted to share information with : No  Share Information with NAME: Syleena, Mchan [phone]           Emotional Assessment Appearance:: Appears stated age Attitude/Demeanor/Rapport: Unable to  Assess Affect (typically observed): Unable to Assess Orientation: :  (Disoriented x4) Alcohol  / Substance Use: Not Applicable Psych Involvement: No (comment)  Admission diagnosis:  Aphasia [R47.01] Acute ischemic left MCA stroke (HCC) [I63.512] Acute right-sided weakness [R53.1] Cerebrovascular accident (CVA), unspecified mechanism (HCC) [I63.9] Right sided numbness [R20.0] Patient Active Problem List   Diagnosis Date Noted   Acute ischemic left MCA stroke (HCC) 03/29/2024   Critical limb ischemia of right lower extremity (HCC) 03/29/2024   Aphasia 03/29/2024   Right sided weakness 03/29/2024   Dehydration 09/11/2022   PUD (peptic ulcer disease) 09/10/2022   HLD (hyperlipidemia) 09/10/2022   Type II diabetes mellitus with renal manifestations (HCC) 09/10/2022   Gout 09/10/2022   Anxiety 09/10/2022   Obesity (BMI 30-39.9) 09/10/2022   Atrial fibrillation, chronic (HCC) 09/10/2022   Shingles 09/10/2022   UTI (urinary tract infection) 09/10/2022   Pancreatic cyst 09/10/2022   Elevated LFTs 09/10/2022   Myocardial injury 09/10/2022   Fall at home, initial encounter 09/10/2022   Intractable nausea and vomiting 09/10/2022   Hyperlipidemia, mixed 11/06/2021   Total knee replacement status 11/06/2021   Primary osteoarthritis of right knee 10/04/2021   Status post reverse arthroplasty of right shoulder 02/11/2021   Rotator cuff arthropathy 12/08/2020   Pressure injury of skin 12/08/2020   Atherosclerosis of abdominal aorta 01/16/2020   Calculus of hepatic duct    Diabetes mellitus with peripheral angiopathy (HCC) 01/12/2018  Osteoarthritis 07/09/2016   Recurrent nephrolithiasis 07/09/2016   Benign essential hypertension 07/05/2016   History of CVA (cerebrovascular accident) 07/05/2016   Medicare annual wellness visit, initial 07/05/2016   Spinal stenosis of lumbar region with neurogenic claudication 04/19/2016   OSA on CPAP 12/29/2015   CKD (chronic kidney disease) stage 4,  GFR 15-29 ml/min (HCC) 05/29/2015   H/O adenomatous polyp of colon 01/02/2015   Polyarticular gout 05/22/2014   Diabetes mellitus type 2, controlled, with complications (HCC) 02/25/2014   Foreign body in bladder and urethra 08/31/2013   Uric acid nephrolithiasis 08/31/2013   Kidney stone 08/09/2013   Ureteric stone 08/09/2013   Hydronephrosis 08/09/2013   Renal colic 08/09/2013   PCP:  Cleotilde Oneil FALCON, MD Pharmacy:   [facility] [id number] [address], [facility] - [address] [place] [address] Phone: [phone] Fax: [phone]     Social Drivers of Health (SDOH) Social History: SDOH Screenings   Food Insecurity: No Food Insecurity (08/25/2023)   Received from [facility]  Housing: Low Risk  (08/25/2023)   Received from [facility]  Transportation Needs: No Transportation Needs (08/25/2023)   Received from [facility]  Utilities: Not At Risk (08/25/2023)   Received from [facility]  Financial Resource Strain: Low Risk  (08/25/2023)   Received from [facility]  Tobacco Use: Low Risk  (02/20/2024)   SDOH Interventions:     Readmission Risk Interventions     No data to display

## 2024-04-02 NOTE — Evaluation (Signed)
 Clinical/Bedside Swallow Evaluation Patient Details  Name: [name] MRN: [id number] Date of Birth: [birth year]  Today's Date: 04/02/2024 Time: SLP Start Time (ACUTE ONLY): 1021 SLP Stop Time (ACUTE ONLY): 1031 SLP Time Calculation (min) (ACUTE ONLY): 10 min  Past Medical History:  Past Medical History:  Diagnosis Date   A-fib (HCC)    back in 2013, for stent placement in kidney, she had a bout of a-fib.  Now, back in rhythm   Arthritis    Chronic kidney disease    Complication of anesthesia    Diabetes mellitus without complication (HCC)    type 2    only takes metformin    Dyspnea    Dysuria    History of kidney stones    Hypertension    Osteopenia    PONV (postoperative nausea and vomiting)    Sleep apnea    lost over 120 lbs, and no long uses it (close to 2 yrs now)   Stroke [facility])    mini 2003--affected right side of body.   took 6 weeks to get over.   Urine, incontinence, stress female    Past Surgical History:  Past Surgical History:  Procedure Laterality Date   APPLICATION OF WOUND VAC  03/29/2024   Procedure: APPLICATION, WOUND VAC;  Surgeon: Gretta Lonni PARAS, MD;  Location: [facility] OR;  Service: Vascular;;   BREAST BIOPSY Right 11/22/2019   U/S Bx-HYDROMARK-BENIGN INTRAMAMMARY LYMPH NODE.   COLONOSCOPY W/ POLYPECTOMY Right    COLONOSCOPY WITH PROPOFOL  N/A 01/09/2015   Procedure: COLONOSCOPY WITH PROPOFOL ;  Surgeon: Lamar ONEIDA Holmes, MD;  Location: [facility] ENDOSCOPY;  Service: Endoscopy;  Laterality: N/A;   ERCP N/A 03/15/2019   Procedure: ENDOSCOPIC RETROGRADE CHOLANGIOPANCREATOGRAPHY (ERCP);  Surgeon: Jinny Carmine, MD;  Location: [facility] ENDOSCOPY;  Service: Endoscopy;  Laterality: N/A;   EYE SURGERY  2013   bilateral cataracts with implants   JOINT REPLACEMENT     left knee  TKR   KIDNEY STONE SURGERY     40 yrs ago, while pregnant, she had to 'be cut in 1/2 to get stone out'   KNEE ARTHROPLASTY Right 11/06/2021   Procedure: COMPUTER ASSISTED TOTAL KNEE  ARTHROPLASTY - DEPUY;  Surgeon: Mardee Lynwood SQUIBB, MD;  Location: [facility] ORS;  Service: Orthopedics;  Laterality: Right;   KNEE ARTHROSCOPY     left knee   LITHOTRIPSY     ltk Left    LUMBAR LAMINECTOMY/DECOMPRESSION MICRODISCECTOMY N/A 09/06/2016   Procedure: LAMINECTOMY AND FORAMINOTOMY LUMBAR THREE- LUMBAR FOUR, LUMBAR FOUR- LUMBAR FIVE;  Surgeon: Reyes Budge, MD;  Location: [facility] OR;  Service: Neurosurgery;  Laterality: N/A;  LAMINECTOMY AND FORAMINOTOMY LUMBAR 3- LUMBAR 4, LUMBAR 4- LUMBAR 5    RADIOLOGY WITH ANESTHESIA N/A 03/29/2024   Procedure: RADIOLOGY WITH ANESTHESIA;  Surgeon: Radiologist, Medication, MD;  Location: [facility] OR;  Service: Radiology;  Laterality: N/A;   REVERSE SHOULDER ARTHROPLASTY Right 12/08/2020   Procedure: Right reverse total shoulder arthroplasty and  biceps tenodesis;  Surgeon: Tobie Priest, MD;  Location: [facility] ORS;  Service: Orthopedics;  Laterality: Right;   throidectomy  1979   goiter   THROMBECTOMY FEMORAL ARTERY Right 03/29/2024   Procedure: REPAIR OF RIGHT COMMON FEMORAL ARTERY AND SUPERFICIAL FEMORAL ARTERY WITH VEIN PATCH ANGIOPLASTY;  Surgeon: Gretta Lonni PARAS, MD;  Location: [facility] OR;  Service: Vascular;  Laterality: Right;   THROMBECTOMY OF BYPASS GRAFT FEMORAL- TIBIAL ARTERY  03/29/2024   Procedure: RIGHT LEG THROMBECTOMY;  Surgeon: Gretta Lonni PARAS, MD;  Location: [facility] OR;  Service: Vascular;;  TRIGGER FINGER RELEASE Left    HPI:  78 yo female presenting to ED 11/20 with acute onset aphasia and  R sided weakness. CTA showed acute LVO of the L MCA s/p TNK and mechanical thrombectomy 11/20. Noted to lose perfusion in RLE, underwent repair of R common femoral and proximal SFA with angioplasty and RLE thrombectomy 11/20. MRI with L MCA territory infarcts. Cortrak placed 11/21. PMH: HTN, T2DM, CKD, OSA    Assessment / Plan / Recommendation  Clinical Impression  Instrumental testing is likely necessary prior to initiating POs given the extent of CN  deficits, though cognitive-linguistic deficits prohibit proceeding at this time. SLP will continue following to assess readiness but in the interim, recommend she remain NPO with the exception of ice chips in moderation after oral care.   Pt presents with moderate CN V and VII deficits affecting facial and labial strength and symmetry. She does not follow commands and has difficulty initiating familiar tasks, improving only minimally with context. R inattention and sensory deficits contribute to reduced awareness of boluses within her oral cavity, leading to anterior loss and oral residue. Sips of water via spoon and cup were consistently followed by coughing. Discussed plan with pt's children and provided education regarding the purpose of ice chips in an NPO pt. Cortrak is in place for meds and nutrition.  SLP Visit Diagnosis: Dysphagia, unspecified (R13.10)    Aspiration Risk  Moderate aspiration risk    Diet Recommendation NPO;Ice chips PRN after oral care    Medication Administration: Via alternative means    Other  Recommendations Oral Care Recommendations: Oral care QID;Oral care prior to ice chip/H20     Assistance Recommended at Discharge    Functional Status Assessment Patient has had a recent decline in their functional status and demonstrates the ability to make significant improvements in function in a reasonable and predictable amount of time.  Frequency and Duration min 2x/week  2 weeks       Prognosis Prognosis for improved oropharyngeal function: Fair Barriers to Reach Goals: Cognitive deficits;Language deficits      Swallow Study   General HPI: 78 yo female presenting to ED 11/20 with acute onset aphasia and  R sided weakness. CTA showed acute LVO of the L MCA s/p TNK and mechanical thrombectomy 11/20. Noted to lose perfusion in RLE, underwent repair of R common femoral and proximal SFA with angioplasty and RLE thrombectomy 11/20. MRI with L MCA territory infarcts.  Cortrak placed 11/21. PMH: HTN, T2DM, CKD, OSA Type of Study: Bedside Swallow Evaluation Previous Swallow Assessment: none in chart Diet Prior to this Study: NPO;Cortrak/Small bore NG tube Temperature Spikes Noted: No Respiratory Status: Nasal cannula History of Recent Intubation: No Behavior/Cognition: Alert;Requires cueing Oral Cavity Assessment: Within Functional Limits Oral Care Completed by SLP: Yes Oral Cavity - Dentition: Missing dentition Vision: Functional for self-feeding Self-Feeding Abilities: Needs assist Patient Positioning: Upright in bed Baseline Vocal Quality: Not observed Volitional Cough: Weak Volitional Swallow: Unable to elicit    Oral/Motor/Sensory Function Overall Oral Motor/Sensory Function: Moderate impairment Facial ROM: Reduced right;Suspected CN VII (facial) dysfunction Facial Symmetry: Abnormal symmetry right;Suspected CN VII (facial) dysfunction Facial Strength: Reduced right;Suspected CN VII (facial) dysfunction Facial Sensation: Reduced right;Suspected CN V (Trigeminal) dysfunction   Ice Chips Ice chips: Impaired Presentation: Spoon Oral Phase Impairments: Reduced labial seal;Poor awareness of bolus Oral Phase Functional Implications: Right anterior spillage Pharyngeal Phase Impairments: Multiple swallows   Thin Liquid Thin Liquid: Impaired Presentation: Cup;Spoon Oral Phase Impairments: Reduced labial seal;Poor  awareness of bolus Oral Phase Functional Implications: Right anterior spillage Pharyngeal  Phase Impairments: Multiple swallows;Cough - Immediate    Nectar Thick Nectar Thick Liquid: Not tested   Honey Thick Honey Thick Liquid: Not tested   Puree Puree: Impaired Presentation: Spoon Oral Phase Impairments: Reduced labial seal;Poor awareness of bolus Oral Phase Functional Implications: Oral residue Pharyngeal Phase Impairments: Multiple swallows   Solid     Solid: Not tested      Damien Blumenthal, M.A., CCC-SLP Speech Language  Pathology, Acute Rehabilitation Services  Secure Chat preferred [phone]  04/02/2024,11:21 AM

## 2024-04-02 NOTE — Progress Notes (Signed)
 PHARMACY - ANTICOAGULATION CONSULT NOTE  Pharmacy Consult for heparin  Indication: Afib in setting of AIS  Labs: Recent Labs    03/31/24 0450 03/31/24 1729 04/01/24 0412 04/01/24 0546 04/01/24 1649 04/02/24 0152 04/02/24 0749  HGB 9.8*  --  8.6*  --   --   --  9.2*  HCT 30.2*  --  26.6*  --   --   --  28.3*  PLT 139*  --  169  --   --   --  192  HEPARINUNFRC 0.68   < >  --    < > 0.75* 0.30 0.39  CREATININE 1.68*  --  1.52*  --   --   --   --    < > = values in this interval not displayed.   Assessment/Plan:  78yo female therapeutic on heparin  after rate adjustments. Will continue infusion at current rate of 350 units/hr and confirm stable with additional level.  11/24 AM: confirmatory heparin  level therapeutic at 0.39 on 350 units/hr. CBC stable  Plan: Continue Heparin  350 units/hr F/u transition to DOAC with stroke team Daily CBC, heparin  level  R. Samual Satterfield, PharmD PGY-1 Acute Care Pharmacy Resident [facility] Please refer to [facility] for [facility] Pharmacy numbers 04/02/2024 8:33 AM

## 2024-04-02 NOTE — Progress Notes (Addendum)
 Palliative Medicine Inpatient Follow Up Note HPI: 78 year old female with past medical history as below, which is significant for atrial fibrillation not on anticoagulation, hypertension, diabetes mellitus type 2, CKD, and OSA who presented to [facility] emergency department 11/20 with complaints of acute onset aphasia and right-sided weakness. (+) Left MCA mechanical thrombectomy performed and TNK given. Ischemic right lower extremity found after procedure, and thrombectomy performed.    Palliative care requested to support additional goals of care conversations.   Today's Discussion 04/02/2024  *Please note that this is a verbal dictation therefore any spelling or grammatical errors are due to the Dragon Medical One system interpretation.  I reviewed the chart notes including nursing notes from today, progress notes from today. I also reviewed vital signs, nursing flowsheets, medication administrations record, labs, and imaging.    I met with [name]'s son, [name] and daughter, [name].  We reviewed [name]'s living will and what her wishes would be and would not be.  We reviewed that under the present circumstances having artificial nutrition does not appear to be what she would have wanted.   Patient's family felt satisfied with the conversation which was held with the neurologist this morning.  They feel they have a better idea of what her future will look like in terms of her hemiplegia and requirement of 24/7 care.  They also share that understanding that is unlikely she will ever be able to have a conversation or express her needs verbally again.  Created space and opportunity for patients son and daughter to explore thoughts feelings and fears regarding their mothers current medical situation.  We discussed the difficulty of the current clinical health state that [name] is in.  We reviewed the even more difficult decisions which need to be made moving forward.  Patient's family share having  known who [name] was prior to this and seeing how she has after the stroke they have a firm understanding that she would not want a life like the one she is living now.  We discussed transition to comfort measures and what that would entail.We talked about transition to comfort measures in house and what that would entail inclusive of medications to control pain, consult dyspnea, agitation, nausea, itching, and hiccups.  We discussed stopping all uneccessary measures such as cardiac monitoring, blood draws, needle sticks, and frequent vital signs.   Patient's family shared they would like to transition the focus of care to comfort measures though would like to wait until patient's husband is here to do so.  Utilized reflective listening throughout our time together given the difficulty of these decisions.  Patient's family would prefer if [name] transitions to the hospice home in [facility]. _______________________ Addendum:  I met with patients daughter at bedside. We discussed that [name]'s spouse, [name] is on his way. We reviewed the plan to transition to comfort measures once he arrives. He himself is in fragile health and under the care of [facility].  We discussed stopping the heparin  gtt and non essential medications.  I shared low dose opioids are initiated in the setting of generalized MSK discomfort from being in the bed.   We discussed care geared towards comfort mediated care.  We reviewed that the NGT will remain in place until patients spouse, [name] has the opportunity to see and spend time with [name]. I shared that she   Questions and concerns addressed/Palliative Support Provided.   Add time: 25 ________________________ Addendum #2:  I met with patients spouse, [name] this  evening and informed him of the above. He is in agreement with the plan to transition to comfort care.  Add time: 15  Objective Assessment: Vital Signs Vitals:   04/02/24 0600 04/02/24  0700  BP: 137/78 124/71  Pulse: 94 86  Resp: 12 16  Temp: 99 F (37.2 C) 98.8 F (37.1 C)  SpO2: 96% 97%    Intake/Output Summary (Last 24 hours) at 04/02/2024 1410 Last data filed at 04/02/2024 1200 Gross per 24 hour  Intake 1024.18 ml  Output 2176 ml  Net -1151.82 ml   Last Weight  Most recent update: 04/02/2024  5:22 AM    Weight  92.2 kg (203 lb 4.2 oz)            Gen: Elderly Caucasian female chronically ill-appearing HEENT: Dobbhoff in place, dry mucous membranes CV: Regular rate and irregular rhythm PULM: On 2 L nasal cannula breathing is even and nonlabored ABD: soft/nontender  EXT: LE edema  Neuro: Somnolent will arouse but falls back asleep within a relatively short period of time  SUMMARY OF RECOMMENDATIONS   DNAR/DNI  Plan to transition focus to comfort oriented care once patient's husband arrives  Plan for [facility] to evaluate for inpatient hospice home in [facility]  Ongoing palliative care support  ______________________________________________________________________________________ [name] Palliative Medicine Team Team Cell Phone: [phone] Please utilize secure chat with additional questions, if there is no response within 30 minutes please call the above phone number  Time Spent: 65 Billing based on MDM: High  Palliative Medicine Team providers are available by phone from 7am to 7pm daily and can be reached through the team cell phone.  Should this patient require assistance outside of these hours, please call the patient's attending physician.

## 2024-04-02 NOTE — Progress Notes (Addendum)
 Progress Note    04/02/2024 7:57 AM 4 Days Post-Op  Subjective:  awake and alert, tracks and able to follow some commands   Vitals:   04/02/24 0600 04/02/24 0700  BP: 137/78 124/71  Pulse: 94 86  Resp: 12 16  Temp: 99 F (37.2 C) 98.8 F (37.1 C)  SpO2: 96% 97%   Physical Exam: Cardiac:  irregular Lungs:  non labored Incisions:  Right groin incision with staples intact, skin tears present, ecchymosis but soft, ABD replaced Extremities:  RLE well perfused and warm with Doppler Dp/PT signals, wiggles right toes a little Abdomen:  soft Neurologic: alert  CBC    Component Value Date/Time   WBC 11.5 (H) 04/01/2024 0412   RBC 2.74 (L) 04/01/2024 0412   HGB 8.6 (L) 04/01/2024 0412   HGB 12.9 08/13/2013 1255   HCT 26.6 (L) 04/01/2024 0412   HCT 39.6 08/13/2013 1255   PLT 169 04/01/2024 0412   PLT 390 08/13/2013 1255   MCV 97.1 04/01/2024 0412   MCV 89 08/13/2013 1255   MCH 31.4 04/01/2024 0412   MCHC 32.3 04/01/2024 0412   RDW 15.5 04/01/2024 0412   RDW 14.2 08/13/2013 1255   LYMPHSABS 2.2 03/29/2024 1311   LYMPHSABS 1.8 08/13/2013 1255   MONOABS 0.7 03/29/2024 1311   MONOABS 0.7 08/13/2013 1255   EOSABS 0.4 03/29/2024 1311   EOSABS 0.2 08/13/2013 1255   BASOSABS 0.1 03/29/2024 1311   BASOSABS 0.1 08/13/2013 1255    BMET    Component Value Date/Time   NA 142 04/01/2024 0412   NA 139 08/13/2013 1255   K 4.7 04/01/2024 0412   K 4.1 08/13/2013 1255   CL 110 04/01/2024 0412   CL 104 08/13/2013 1255   CO2 24 04/01/2024 0412   CO2 30 08/13/2013 1255   GLUCOSE 247 (H) 04/01/2024 0412   GLUCOSE 147 (H) 08/13/2013 1255   BUN 29 (H) 04/01/2024 0412   BUN 14 08/13/2013 1255   CREATININE 1.52 (H) 04/01/2024 0412   CREATININE 1.62 (H) 08/13/2013 1255   CALCIUM  8.2 (L) 04/01/2024 0412   CALCIUM  9.1 08/13/2013 1255   GFRNONAA 35 (L) 04/01/2024 0412   GFRNONAA 32 (L) 08/13/2013 1255   GFRAA 42 (L) 01/24/2019 1137   GFRAA 37 (L) 08/13/2013 1255    INR     Component Value Date/Time   INR 1.2 03/30/2024 0055     Intake/Output Summary (Last 24 hours) at 04/02/2024 0757 Last data filed at 04/02/2024 0700 Gross per 24 hour  Intake 1688.65 ml  Output 1420 ml  Net 268.65 ml     Assessment/Plan:  78 y.o. female is s/p 1.  Repair of right common femoral artery and proximal SFA with removal of closure device and vein patch angioplasty using great saphenous vein 2.  Right lower extremity thrombectomy 3.  Drain placement in right groin wound (19 [address]) 4.  Incisional Prevena VAC right groin   4 Days Post-Op   RLE remains well perfused and warm with Dp/PT doppler signals Right groin with staples intact. ABD replaced JP with 50 cc SS output overnight Will need to continue to closely monitor right groin incision- high risk for breakdown   Teretha Charlyne RIGGERS [facility] [phone] 04/02/2024 7:57 AM  I have seen and evaluated the patient. I agree with the PA note as documented above.  Right groin looks okay with dry dressing and staples intact.  Brisk PT signal in the right foot.  Will leave drain for  now until output decreases.  High risk for wound breakdown in the groin as we discussed.  Lonni DOROTHA Gaskins, MD [facility] [place] Office: [phone]

## 2024-04-02 NOTE — Progress Notes (Addendum)
 STROKE TEAM PROGRESS NOTE    SIGNIFICANT HOSPITAL EVENTS 11/20: Patient admitted with acute onset aphasia and right-sided weakness, left MCA mechanical thrombectomy performed and TNK given.  Ischemic right lower extremity found after procedure, and thrombectomy performed. 11/21:  - Patient started on heparin  for atrial fibrillation - 24 hour CTH with no hemorrhagic transformation with large left middle cerebral artery infarct with mild cytotoxic edema 11/22:  - Metoprolol  added for rate control with elevated heart rate overnight up to 160s  - Vascular surgery concerned for need for possible muscle flap procedure on Monday for right groin wound following wound vac discontinuation this morning.   INTERIM HISTORY/SUBJECTIVE Family at the bedside.  Patient remains on heparin  drip.  Heart rate is better controlled today.  No new neurological events overnight.  Neurological exam remains stable and unchanged  CBC    Component Value Date/Time   WBC 12.2 (H) 04/02/2024 0749   RBC 2.95 (L) 04/02/2024 0749   HGB 9.2 (L) 04/02/2024 0749   HGB 12.9 08/13/2013 1255   HCT 28.3 (L) 04/02/2024 0749   HCT 39.6 08/13/2013 1255   PLT 192 04/02/2024 0749   PLT 390 08/13/2013 1255   MCV 95.9 04/02/2024 0749   MCV 89 08/13/2013 1255   MCH 31.2 04/02/2024 0749   MCHC 32.5 04/02/2024 0749   RDW 15.3 04/02/2024 0749   RDW 14.2 08/13/2013 1255   LYMPHSABS 2.2 03/29/2024 1311   LYMPHSABS 1.8 08/13/2013 1255   MONOABS 0.7 03/29/2024 1311   MONOABS 0.7 08/13/2013 1255   EOSABS 0.4 03/29/2024 1311   EOSABS 0.2 08/13/2013 1255   BASOSABS 0.1 03/29/2024 1311   BASOSABS 0.1 08/13/2013 1255    BMET    Component Value Date/Time   NA 142 04/01/2024 0412   NA 139 08/13/2013 1255   K 4.7 04/01/2024 0412   K 4.1 08/13/2013 1255   CL 110 04/01/2024 0412   CL 104 08/13/2013 1255   CO2 24 04/01/2024 0412   CO2 30 08/13/2013 1255   GLUCOSE 247 (H) 04/01/2024 0412   GLUCOSE 147 (H) 08/13/2013 1255   BUN 29  (H) 04/01/2024 0412   BUN 14 08/13/2013 1255   CREATININE 1.52 (H) 04/01/2024 0412   CREATININE 1.62 (H) 08/13/2013 1255   CALCIUM  8.2 (L) 04/01/2024 0412   CALCIUM  9.1 08/13/2013 1255   GFRNONAA 35 (L) 04/01/2024 0412   GFRNONAA 32 (L) 08/13/2013 1255    IMAGING past 24 hours No results found.  Vitals:   04/02/24 0400 04/02/24 0500 04/02/24 0600 04/02/24 0700  BP: 117/71 (!) 124/90 137/78 124/71  Pulse: 84 (!) 111 94 86  Resp: (!) 25 14 12 16   Temp: 98.8 F (37.1 C) 98.8 F (37.1 C) 99 F (37.2 C) 98.8 F (37.1 C)  TempSrc:      SpO2: 98% 98% 96% 97%  Weight:  92.2 kg    Height:         PHYSICAL EXAM General: Ill-appearing elderly patient in no acute distress CV: A-fib on the monitor Respiratory:  Regular, unlabored respirations on supplemental O2   NEURO:  Mental Status: Patient resting in bed with eyes open, lethargic. Does not follow commands, does not attempt to verbalize throughout assessment.  Does not mimic examiner.    Pupils equal round and reactive, does not blink to threat on the right, has a left gaze preference though we will cross midline towards the right briefly, right facial droop, right hemiplegia.  RUE flaccid, RLE triple flex movement with noxious  stimuli, will localize with LUE, LLE withdraws to noxious stimuli.    Most Recent NIH  1a Level of Conscious.: 1 1b LOC Questions: 2 1c LOC Commands: 2 2 Best Gaze: 1 3 Visual: 2 4 Facial Palsy: 0 5a Motor Arm - left: 3 5b Motor Arm - Right: 2 6a Motor Leg - Left: 4 6b Motor Leg - Right: 3 7 Limb Ataxia: 0 8 Sensory: 0 9 Best Language: 3 10 Dysarthria: 2 11 Extinct. and Inatten.: 0 TOTAL: 25   ASSESSMENT/PLAN [name] is a 78 y.o. female with history of A-fib not on anticoagulation, hypertension, diabetes, CKD and sleep apnea admitted for acute onset aphasia and right-sided weakness.  Patient was given TNK and taken to interventional radiology for mechanical thrombectomy,  which was successful.  After thrombectomy, she was found to have an ischemic right lower extremity, and she was taken to the OR for right lower extremity thrombectomy.  MRI brain reveals acute left MCA territory infarcts, and 24-hour CT reveals no hemorrhagic transformation but hyperdense left MCA.  Given size of infarcts, MCA likely reoccluded.  Will start patient on heparin  for atrial fibrillation.  NIH on Admission 17   Stroke: Large left MCA territory infarct s/p TNK and IR with TICI 3 reperfusion, etiology: cardioembolic in the setting new diagnosis of A-fib Code Stroke CT head No acute abnormality. Small vessel disease. Atrophy. ASPECTS 10.    CTA head & neck acute left MCA occlusion, right VA occlusion with distal severe stenosis, left ICA siphon moderate stenosis, right ICA bulb 50% stenosis Status post IR with  TICI3 24 hour CT evolving infarct in left MCA territory with no hemorrhagic transformation, asymmetric density of left MCA MRI large acute left MCA territory infarct 2D Echo EF 55-60%  LDL 83 HgbA1c 6.9 VTE prophylaxis - heparin  IV aspirin  81 mg daily prior to admission, now on heparin  IV  Therapy recommendations:  SNF Disposition:  pending, palliative care on board. Had long discussion with son and daughter, they will discuss GOC with palliative care   Ischemic RLE with FA occlusion, post IR Occurred post-thrombectomy Underline PVD Vascular surgery on board Emergent right CFA and SFA repair with right LE thrombectomy Wound VAC removed JP drain in place   History of stroke Family reports patient had a stroke in her 36s ? History of atrial fibrillation during previous hospitalizations in the setting of acute illness  No records for review on EMAR of previous stroke though family reports stroke as mild with possible afib No residual deficits per family   Atrial fibrillation with RVR Home Meds: metoprolol  100 mg daily Continue telemetry monitoring Per family, patient  has not had a formal diagnosis of atrial fibrillation though she has had issues since July/August with waking up with a really high heart rate and then bottoming out Previous EKGs during admissions without evidence of atrial fibrillation Begin anticoagulation with IV heparin  03/30/24 On amiodarone  IV  Metoprolol  25->50 mg Q8h, monitor for hypotension    Cerebral aneurysm CTA head and neck showed 6x4 aneurysm of left cavernous ICA Outpt follow up with IR team   Hypertension Home meds:  amlodipine  10 mg daily, losartan 50 mg daily Stable Now on metoprolol  25->50 bid Long-term BP goal normotensive   Hyperlipidemia Home meds: None LDL 83, goal < 70 Add rosuvastatin  20 mg daily Continue statin at discharge   Diabetes type II Controlled Home meds: Metformin  500 mg 3 times daily HgbA1c 6.9, goal < 7.0 CBGs SSI Recommend close  follow-up with PCP for better DM control   Dysphagia Patient has post-stroke dysphagia, SLP consulted NPO Coretrak in place 11/21 On TF @ 50   Other Stroke Risk Factors Obesity, Body mass index is 36.01 kg/m., BMI >/= 30 associated with increased stroke risk, recommend weight loss, diet and exercise as appropriate  Obstructive sleep apnea   Other Active Problems Leukocytosis, WBC 14.9--13.6--11.5-12.2, afebrile CKD 3B, creatinine 1.81--1.68--1.52    Hospital day # 4   Karna Geralds DNP, ACNPC-AG  [facility]  ATTENDING NOTE: I reviewed above note and agree with the assessment and plan. Pt was seen and examined.   Son and daughter are at bedside.  Patient lying bed, eyes open, awake, still has left gaze, not able to cross midline, right neglect, right hemiplegia.  Had long discussion with son and daughter at bedside, regarding prognosis and GOC expectations.  They will discuss with palliative care regarding further decision.  Continue current care at this time.  For detailed assessment and plan, please refer to above as I have made  changes wherever appropriate.   Ary Cummins, MD PhD Stroke Neurology 04/02/2024 12:14 PM  This patient is critically ill due to large left MCA stroke status post IR, right lower extremity ischemia leg status post surgery, A-fib RVR and at significant risk of neurological worsening, death form recurrent stroke, hemorrhagic induration, ischemic limb, heart failure, arrhythmia. This patient's care requires constant monitoring of vital signs, hemodynamics, respiratory and cardiac monitoring, review of multiple databases, neurological assessment, discussion with family, other specialists and medical decision making of high complexity. I spent 40 minutes of neurocritical care time in the care of this patient. I had long discussion with son and daughter at bedside, updated pt current condition, treatment plan and potential prognosis, and answered all the questions. They expressed understanding and appreciation.   ADDENDUM:  Informed by palliative care services, after GOC discussion with family, family decided for comfort care measures.  Will start comfort care order set.  Ary Cummins, MD PhD Stroke Neurology 04/02/2024 3:16 PM    To contact Stroke Continuity provider, please refer to [email or web address]. After hours, contact General Neurology

## 2024-04-02 NOTE — Progress Notes (Signed)
 Occupational Therapy Treatment Patient Details Name: [name] MRN: [id number] DOB: October 14, 1945 Today's Date: 04/02/2024   History of present illness 78 y.o. female presents [facility] hospital on 03/29/2024 with acute onset aphasia and R weakness. CTA shows and acute LVO of L MCA. Pt underwent thrombectomy on 11/20. After thrombectomy pt noted to lose perfusion to RLE, underwent repair of R common femoral and proximal SFA with angioplasty and RLE thrombectomy on 11/20. PMH includes HTN, DMII, CKD, OSA.   OT comments  Pt is making limited progress towards their acute OT goals. Pt seen with PT to safely progress to the EOB. Overall she continues to need total A +2 for all aspects of her care and mobility. Pt did follow a few visual/tactile cues with L arm and leg, and uses her R arm to assist in automatic tasks (rolling, balance, sitting adjustment). She tolerated sitting for ~10 minutes with CGA-mod A for balance. OT to continue to follow acutely to facilitate progress towards established goals. Pt will continue to benefit from skilled inpatient follow up therapy, <3 hours/day.          Equipment Recommendations  [employer];Hospital bed;Wheelchair cushion (measurements OT);Wheelchair (measurements OT);Tub/shower bench;BSC/3in1;Other (comment)       Precautions / Restrictions Precautions Precautions: Fall Recall of Precautions/Restrictions: Impaired Precaution/Restrictions Comments: R groin JP drain Restrictions Weight Bearing Restrictions Per Provider Order: No       Mobility Bed Mobility Overal bed mobility: Needs Assistance Bed Mobility: Supine to Sit, Sit to Supine     Supine to sit: Total assist, +2 for physical assistance Sit to supine: Total assist, +2 for physical assistance   General bed mobility comments: pt does utilize LUE to attempt to pull into sitting, also utilizes LLE to mobilize toward edge of bed, totalA for R side    Transfers                    General transfer comment: deferred     Balance Overall balance assessment: Needs assistance Sitting-balance support: Single extremity supported, Feet unsupported Sitting balance-Leahy Scale: Poor Sitting balance - Comments: CGA-modA Postural control: Posterior lean, Right lateral lean       ADL either performed or assessed with clinical judgement   ADL               General ADL Comments: Pt requires total A x2 at bed level.    Extremity/Trunk Assessment Upper Extremity Assessment Upper Extremity Assessment: RUE deficits/detail;LUE deficits/detail RUE Deficits / Details: No active movement noted. Functional P/ROM acheived. Increased swelling noted and extensive bruising on medial portion of arm LUE Deficits / Details: Noted pt assisted with shoulder ROM although unable to fully assess d/t impaired cognition   Lower Extremity Assessment Lower Extremity Assessment: Defer to PT evaluation        Vision   Vision Assessment?: Yes;Vision impaired- to be further tested in functional context Additional Comments: Pt visually tracks in all directions when there are no distractions, sustains a L gaze with R inattention otherwise   Perception Perception Perception: Impaired Preception Impairment Details: Inattention/Neglect   Praxis Praxis Praxis: Not tested   Communication Communication Communication: Impaired Factors Affecting Communication: Difficulty expressing self   Cognition Arousal: Alert Behavior During Therapy: Flat affect Cognition: Difficult to assess Difficult to assess due to: Impaired communication           OT - Cognition Comments: pt followed a few visual/tactile cues, unable to facilitate any command following to voice. did not  nod yes/no to direct questions.                 Following commands: Impaired        Cueing   Cueing Techniques: Verbal cues, Gestural cues, Tactile cues, Visual cues        General Comments pt with HR  fluctuating between 90-130s with mobility. SpO2 and BP stable.    Pertinent Vitals/ Pain       Pain Assessment Pain Assessment: Faces Faces Pain Scale: No hurt Pain Intervention(s): Monitored during session  Home Living     Available Help at Discharge: Family;Available 24 hours/day Type of Home: House              Lives With: Spouse        Frequency  Min 2X/week        Progress Toward Goals  OT Goals(current goals can now be found in the care plan section)  Progress towards OT goals: Not progressing toward goals - comment  Acute Rehab OT Goals OT Goal Formulation: With family Time For Goal Achievement: 04/13/24 Potential to Achieve Goals: Fair ADL Goals Pt Will Perform Grooming: with max assist;sitting;bed level Pt Will Perform Upper Body Bathing: with max assist;sitting;bed level Additional ADL Goal #1: Pt will tolerate sitting EOB for self care task with therapy for 10 minutes Additional ADL Goal #2: Pt will transition from supine to sitting EOB with Max A x2 in order to participate in self care task.  Plan      Co-evaluation    PT/OT/SLP Co-Evaluation/Treatment: Yes Reason for Co-Treatment: Complexity of the patient's impairments (multi-system involvement);For patient/therapist safety;Necessary to address cognition/behavior during functional activity;To address functional/ADL transfers PT goals addressed during session: Mobility/safety with mobility;Strengthening/ROM OT goals addressed during session: Strengthening/ROM      AM-PAC OT 6 Clicks Daily Activity     Outcome Measure   Help from another person eating meals?: Total Help from another person taking care of personal grooming?: Total Help from another person toileting, which includes using toliet, bedpan, or urinal?: Total Help from another person bathing (including washing, rinsing, drying)?: Total Help from another person to put on and taking off regular upper body clothing?: Total Help from  another person to put on and taking off regular lower body clothing?: Total 6 Click Score: 6    End of Session    OT Visit Diagnosis: Unsteadiness on feet (R26.81);Muscle weakness (generalized) (M62.81);Other symptoms and signs involving cognitive function;Other symptoms and signs involving the nervous system (R29.898);Hemiplegia and hemiparesis Hemiplegia - Right/Left: Right Hemiplegia - dominant/non-dominant: Dominant Hemiplegia - caused by: Cerebral infarction   Activity Tolerance Patient limited by fatigue;Patient limited by lethargy   Patient Left in bed;with call bell/phone within reach;with family/visitor present   Nurse Communication Mobility status        Time: 8882-8850 OT Time Calculation (min): 32 min  Charges: OT General Charges $OT Visit: 1 Visit OT Treatments $Therapeutic Activity: 8-22 mins  Lucie Kendall, OTR/L Acute Rehabilitation Services Office [phone] Secure Chat Communication Preferred   Lucie JONETTA Kendall 04/02/2024, 1:02 PM

## 2024-04-03 DIAGNOSIS — Z515 Encounter for palliative care: Secondary | ICD-10-CM | POA: Diagnosis not present

## 2024-04-03 DIAGNOSIS — Z7189 Other specified counseling: Secondary | ICD-10-CM | POA: Diagnosis not present

## 2024-04-03 DIAGNOSIS — I63412 Cerebral infarction due to embolism of left middle cerebral artery: Secondary | ICD-10-CM | POA: Diagnosis not present

## 2024-04-03 DIAGNOSIS — Z66 Do not resuscitate: Secondary | ICD-10-CM | POA: Diagnosis not present

## 2024-04-03 MED ORDER — MORPHINE SULFATE (PF) 2 MG/ML IV SOLN: 2.0000 mg | Freq: Four times a day (QID) | INTRAVENOUS | Status: DC

## 2024-04-03 MED ORDER — BIOTENE DRY MOUTH MT LIQD: 15.0000 mL | OROMUCOSAL | Status: DC | PRN

## 2024-04-03 MED ORDER — ONDANSETRON 4 MG PO TBDP: 4.0000 mg | ORAL_TABLET | Freq: Four times a day (QID) | ORAL | Status: DC | PRN

## 2024-04-03 MED ORDER — GLYCOPYRROLATE 1 MG PO TABS: 1.0000 mg | ORAL_TABLET | ORAL | Status: DC | PRN

## 2024-04-03 MED ORDER — POLYVINYL ALCOHOL 1.4 % OP SOLN: 1.0000 [drp] | Freq: Four times a day (QID) | OPHTHALMIC | Status: DC | PRN

## 2024-04-03 MED ORDER — GLYCOPYRROLATE 0.2 MG/ML IJ SOLN: 0.2000 mg | INTRAMUSCULAR | Status: DC | PRN

## 2024-04-03 MED ORDER — MORPHINE SULFATE (PF) 2 MG/ML IV SOLN: 2.0000 mg | INTRAVENOUS | Status: DC | PRN

## 2024-04-03 MED ORDER — LORAZEPAM 2 MG/ML IJ SOLN: 1.0000 mg | INTRAMUSCULAR | Status: DC | PRN

## 2024-04-03 MED ORDER — LORAZEPAM 1 MG PO TABS: 1.0000 mg | ORAL_TABLET | ORAL | Status: DC | PRN

## 2024-04-03 MED ORDER — LORAZEPAM 2 MG/ML PO CONC: 1.0000 mg | ORAL | Status: DC | PRN

## 2024-04-03 NOTE — Progress Notes (Signed)
  Progress Note    04/03/2024 9:47 AM 5 Days Post-Op  Patient s/p repair of right CF artery and proximal SFA with vein patch angioplasty using GSV, RLE thrombectomy with drain placement to right groin Now transitioning to comfort care Right groin with staples JP drain with minimal 11cc of SS output. Drain removed Vascular available as needed    Teretha Damme, PA-C [facility] [phone] 04/03/2024 9:47 AM

## 2024-04-03 NOTE — Progress Notes (Signed)
 Report to [place] of home of hospice given.

## 2024-04-03 NOTE — Plan of Care (Signed)
 Problem: Education: Goal: Knowledge of disease or condition will improve Outcome: Progressing Goal: Knowledge of secondary prevention will improve (MUST DOCUMENT ALL) Outcome: Progressing Goal: Knowledge of patient specific risk factors will improve (DELETE if not current risk factor) Outcome: Progressing   Problem: Ischemic Stroke/TIA Tissue Perfusion: Goal: Complications of ischemic stroke/TIA will be minimized Outcome: Progressing   Problem: Coping: Goal: Will verbalize positive feelings about self Outcome: Progressing Goal: Will identify appropriate support needs Outcome: Progressing   Problem: Health Behavior/Discharge Planning: Goal: Ability to manage health-related needs will improve Outcome: Progressing Goal: Goals will be collaboratively established with patient/family Outcome: Progressing   Problem: Self-Care: Goal: Ability to participate in self-care as condition permits will improve Outcome: Progressing Goal: Verbalization of feelings and concerns over difficulty with self-care will improve Outcome: Progressing Goal: Ability to communicate needs accurately will improve Outcome: Progressing   Problem: Nutrition: Goal: Risk of aspiration will decrease Outcome: Progressing Goal: Dietary intake will improve Outcome: Progressing   Problem: Education: Goal: Understanding of CV disease, CV risk reduction, and recovery process will improve Outcome: Progressing Goal: Individualized Educational Video(s) Outcome: Progressing   Problem: Activity: Goal: Ability to return to baseline activity level will improve Outcome: Progressing   Problem: Cardiovascular: Goal: Ability to achieve and maintain adequate cardiovascular perfusion will improve Outcome: Progressing Goal: Vascular access site(s) Level 0-1 will be maintained Outcome: Progressing   Problem: Health Behavior/Discharge Planning: Goal: Ability to safely manage health-related needs after discharge will  improve Outcome: Progressing   Problem: Education: Goal: Knowledge of General Education information will improve Description: Including pain rating scale, medication(s)/side effects and non-pharmacologic comfort measures Outcome: Progressing   Problem: Health Behavior/Discharge Planning: Goal: Ability to manage health-related needs will improve Outcome: Progressing   Problem: Clinical Measurements: Goal: Ability to maintain clinical measurements within normal limits will improve Outcome: Progressing Goal: Will remain free from infection Outcome: Progressing Goal: Diagnostic test results will improve Outcome: Progressing Goal: Respiratory complications will improve Outcome: Progressing Goal: Cardiovascular complication will be avoided Outcome: Progressing   Problem: Activity: Goal: Risk for activity intolerance will decrease Outcome: Progressing   Problem: Nutrition: Goal: Adequate nutrition will be maintained Outcome: Progressing   Problem: Coping: Goal: Level of anxiety will decrease Outcome: Progressing   Problem: Elimination: Goal: Will not experience complications related to bowel motility Outcome: Progressing Goal: Will not experience complications related to urinary retention Outcome: Progressing   Problem: Pain Managment: Goal: General experience of comfort will improve and/or be controlled Outcome: Progressing   Problem: Safety: Goal: Ability to remain free from injury will improve Outcome: Progressing   Problem: Skin Integrity: Goal: Risk for impaired skin integrity will decrease Outcome: Progressing   Problem: Education: Goal: Ability to describe self-care measures that may prevent or decrease complications (Diabetes Survival Skills Education) will improve Outcome: Progressing Goal: Individualized Educational Video(s) Outcome: Progressing   Problem: Coping: Goal: Ability to adjust to condition or change in health will improve Outcome:  Progressing   Problem: Fluid Volume: Goal: Ability to maintain a balanced intake and output will improve Outcome: Progressing   Problem: Health Behavior/Discharge Planning: Goal: Ability to identify and utilize available resources and services will improve Outcome: Progressing Goal: Ability to manage health-related needs will improve Outcome: Progressing   Problem: Metabolic: Goal: Ability to maintain appropriate glucose levels will improve Outcome: Progressing   Problem: Nutritional: Goal: Maintenance of adequate nutrition will improve Outcome: Progressing Goal: Progress toward achieving an optimal weight will improve Outcome: Progressing   Problem: Skin Integrity: Goal: Risk for impaired skin  integrity will decrease Outcome: Progressing   Problem: Tissue Perfusion: Goal: Adequacy of tissue perfusion will improve Outcome: Progressing   Problem: Education: Goal: Knowledge of the prescribed therapeutic regimen will improve Outcome: Progressing

## 2024-04-03 NOTE — TOC Transition Note (Signed)
 Transition of Care [facility]) - Discharge Note   Patient Details  Name: [name] MRN: [id number] Date of Birth: 1945/10/21  Transition of [facility]) CM/SW Contact:  Almarie CHRISTELLA Goodie, LCSW Phone Number: 04/03/2024, 1:45 PM   Clinical Narrative:   CSW updated by [facility] that patient has been approved for [facility] in [place], bed is available today. Family in agreement, consents signed, requesting transport after 4 pm. Transport arranged with [facility] for after 4.  Nurse to call report to [phone].    Final next level of care: Hospice Medical Facility Barriers to Discharge: Barriers Resolved   Patient Goals and CMS Choice   CMS Medicare.gov Compare Post Acute Care list provided to:: Patient Represenative (must comment)        Discharge Placement              Patient chooses bed at:  [facility] in [place]) Patient to be transferred to facility by: [facility]   Patient and family notified of of transfer: 04/03/24  Discharge Plan and Services Additional resources added to the After Visit Summary for   In-house Referral: Clinical Social Work                                   Social Drivers of Health (SDOH) Interventions SDOH Screenings   Food Insecurity: No Food Insecurity (08/25/2023)   Received from [facility]  Housing: Low Risk  (08/25/2023)   Received from [facility]  Transportation Needs: No Transportation Needs (08/25/2023)   Received from [facility]  Utilities: Not At Risk (08/25/2023)   Received from [facility]  Financial Resource Strain: Low Risk  (08/25/2023)   Received from [facility]  Tobacco Use: Low Risk  (02/20/2024)     Readmission Risk Interventions     No data to display

## 2024-04-03 NOTE — Progress Notes (Signed)
 [id number] [facility] Liaison Note  Received request from Nadia Rayyan, Care Manager, for family interest in Hospice Home.  Eligibility confirmed.  Met with family to confirm interest and explain services. Family agreeable to transfer today.  Care Manager aware.  RN please arrange transport for 4:00pm or later and call report to [phone] [facility]) prior to patient leaving the unit.  Please send signed and completed DNR with patient at discharge.  Thank you, Inocente Jacobs BSN RN [facility] Liaison [phone]

## 2024-04-03 NOTE — Progress Notes (Signed)
 Nutrition Brief Note  Chart reviewed. Pt now transitioning to comfort care.  No further nutrition interventions planned at this time.  Please re-consult as needed.   Vernell Lukes, RD, LDN, CNSC Registered Dietitian II Please reach out via secure chat

## 2024-04-03 NOTE — Progress Notes (Signed)
 STROKE TEAM PROGRESS NOTE    SIGNIFICANT HOSPITAL EVENTS 11/20: Patient admitted with acute onset aphasia and right-sided weakness, left MCA mechanical thrombectomy performed and TNK given.  Ischemic right lower extremity found after procedure, and thrombectomy performed. 11/21:  - Patient started on heparin  for atrial fibrillation - 24 hour CTH with no hemorrhagic transformation with large left middle cerebral artery infarct with mild cytotoxic edema 11/22:  - Metoprolol  added for rate control with elevated heart rate overnight up to 160s  - Vascular surgery concerned for need for possible muscle flap procedure on Monday for right groin wound following wound vac discontinuation this morning.   INTERIM HISTORY/SUBJECTIVE Family at the bedside.  Patient appears to be resting comfortably in no apparent distress.  Awaiting with our care to evaluate patient and awaiting possible bed availability All medications have been discontinued except comfort medications.  CBC    Component Value Date/Time   WBC 12.2 (H) 04/02/2024 0749   RBC 2.95 (L) 04/02/2024 0749   HGB 9.2 (L) 04/02/2024 0749   HGB 12.9 08/13/2013 1255   HCT 28.3 (L) 04/02/2024 0749   HCT 39.6 08/13/2013 1255   PLT 192 04/02/2024 0749   PLT 390 08/13/2013 1255   MCV 95.9 04/02/2024 0749   MCV 89 08/13/2013 1255   MCH 31.2 04/02/2024 0749   MCHC 32.5 04/02/2024 0749   RDW 15.3 04/02/2024 0749   RDW 14.2 08/13/2013 1255   LYMPHSABS 2.2 03/29/2024 1311   LYMPHSABS 1.8 08/13/2013 1255   MONOABS 0.7 03/29/2024 1311   MONOABS 0.7 08/13/2013 1255   EOSABS 0.4 03/29/2024 1311   EOSABS 0.2 08/13/2013 1255   BASOSABS 0.1 03/29/2024 1311   BASOSABS 0.1 08/13/2013 1255    BMET    Component Value Date/Time   NA 142 04/01/2024 0412   NA 139 08/13/2013 1255   K 4.7 04/01/2024 0412   K 4.1 08/13/2013 1255   CL 110 04/01/2024 0412   CL 104 08/13/2013 1255   CO2 24 04/01/2024 0412   CO2 30 08/13/2013 1255   GLUCOSE 247 (H)  04/01/2024 0412   GLUCOSE 147 (H) 08/13/2013 1255   BUN 29 (H) 04/01/2024 0412   BUN 14 08/13/2013 1255   CREATININE 1.52 (H) 04/01/2024 0412   CREATININE 1.62 (H) 08/13/2013 1255   CALCIUM  8.2 (L) 04/01/2024 0412   CALCIUM  9.1 08/13/2013 1255   GFRNONAA 35 (L) 04/01/2024 0412   GFRNONAA 32 (L) 08/13/2013 1255    IMAGING past 24 hours No results found.  Vitals:   04/02/24 1700 04/02/24 2101 04/03/24 0823 04/03/24 1133  BP: (!) 149/109 115/67 90/67 (!) 128/118  Pulse: (!) 120 79 91 (!) 142  Resp: 15 14 16 16   Temp: 100.2 F (37.9 C) 99.3 F (37.4 C) 98.8 F (37.1 C) 98.4 F (36.9 C)  TempSrc:  Oral Oral Axillary  SpO2: 96% 96% 94% 96%  Weight:      Height:         PHYSICAL EXAM General: Ill-appearing elderly patient in no acute distress CV: A-fib on the monitor Respiratory:  Regular, unlabored respirations on supplemental O2   NEURO:  Mental Status: Patient resting in bed with eyes open, lethargic. Does not follow commands, does not attempt to verbalize throughout assessment.  Does not mimic examiner.    Pupils equal round and reactive, does not blink to threat on the right, has a left gaze preference though we will cross midline towards the right briefly, right facial droop, right hemiplegia.  RUE flaccid,  RLE triple flex movement with noxious stimuli, will localize with LUE, LLE withdraws to noxious stimuli.    Most Recent NIH  1a Level of Conscious.: 1 1b LOC Questions: 2 1c LOC Commands: 2 2 Best Gaze: 1 3 Visual: 2 4 Facial Palsy: 0 5a Motor Arm - left: 3 5b Motor Arm - Right: 2 6a Motor Leg - Left: 4 6b Motor Leg - Right: 3 7 Limb Ataxia: 0 8 Sensory: 0 9 Best Language: 3 10 Dysarthria: 2 11 Extinct. and Inatten.: 0 TOTAL: 25   ASSESSMENT/PLAN [name] is a 78 y.o. female with history of A-fib not on anticoagulation, hypertension, diabetes, CKD and sleep apnea admitted for acute onset aphasia and right-sided weakness.  Patient was  given TNK and taken to interventional radiology for mechanical thrombectomy, which was successful.  After thrombectomy, she was found to have an ischemic right lower extremity, and she was taken to the OR for right lower extremity thrombectomy.  MRI brain reveals acute left MCA territory infarcts, and 24-hour CT reveals no hemorrhagic transformation but hyperdense left MCA.  Given size of infarcts, MCA likely reoccluded.  Will start patient on heparin  for atrial fibrillation.  NIH on Admission 17   Stroke: Large left MCA territory infarct s/p TNK and IR with TICI 3 reperfusion, etiology: cardioembolic in the setting new diagnosis of A-fib Code Stroke CT head No acute abnormality. Small vessel disease. Atrophy. ASPECTS 10.    CTA head & neck acute left MCA occlusion, right VA occlusion with distal severe stenosis, left ICA siphon moderate stenosis, right ICA bulb 50% stenosis Status post IR with  TICI3 24 hour CT evolving infarct in left MCA territory with no hemorrhagic transformation, asymmetric density of left MCA MRI large acute left MCA territory infarct 2D Echo EF 55-60%  LDL 83 HgbA1c 6.9 VTE prophylaxis - heparin  IV aspirin  81 mg daily prior to admission, heparin  DC'd Therapy recommendations:  SNF Disposition:  pending, full comfort care and ongoing palliative care support   Ischemic RLE with FA occlusion, post IR Occurred post-thrombectomy Underline PVD Vascular surgery on board Emergent right CFA and SFA repair with right LE thrombectomy Wound VAC removed JP drain in place   History of stroke Family reports patient had a stroke in her 58s ? History of atrial fibrillation during previous hospitalizations in the setting of acute illness  No records for review on EMAR of previous stroke though family reports stroke as mild with possible afib No residual deficits per family   Atrial fibrillation with RVR Home Meds: metoprolol  100 mg daily Continue telemetry monitoring Per  family, patient has not had a formal diagnosis of atrial fibrillation though she has had issues since July/August with waking up with a really high heart rate and then bottoming out Previous EKGs during admissions without evidence of atrial fibrillation Begin anticoagulation with IV heparin  discontinued On amiodarone  IV has been discontinued Metoprolol  25->50 mg Q8h, discontinued   Cerebral aneurysm CTA head and neck showed 6x4 aneurysm of left cavernous ICA Outpt follow up with IR team   Hypertension Home meds:  amlodipine  10 mg daily, losartan 50 mg daily Stable Now on metoprolol  25->50 bid discontinued Long-term BP goal normotensive   Hyperlipidemia Home meds: None LDL 83, goal < 70 Add rosuvastatin  20 mg daily-discontinue   Diabetes type II Controlled Home meds: Metformin  500 mg 3 times daily HgbA1c 6.9, goal < 7.0 CBGs SSI Recommend close follow-up with PCP for better DM control   Dysphagia Patient  has post-stroke dysphagia, SLP consulted NPO Coretrak in place 11/21 discontinue   Other Stroke Risk Factors Obesity, Body mass index is 36.01 kg/m., BMI >/= 30 associated with increased stroke risk, recommend weight loss, diet and exercise as appropriate  Obstructive sleep apnea   Other Active Problems Leukocytosis, WBC 14.9--13.6--11.5-12.2, afebrile CKD 3B, creatinine 1.81--1.68--1.52    Hospital day # 5   Karna Geralds DNP, ACNPC-AG  [facility]     To contact Stroke Continuity provider, please refer to [email or web address]. After hours, contact General Neurology

## 2024-04-03 NOTE — Discharge Summary (Addendum)
 Stroke Discharge Summary  Patient ID: [name]   MRN: [id number]      DOB: Apr 04, 1946  Date of Admission: 03/29/2024 Date of Discharge: 04/03/2024  Attending Physician:  Stroke, Md, MD Consultant(s):   Treatment Team:  Gretta Lonni PARAS, MD vascular surgery  Patient's PCP:  Cleotilde Oneil FALCON, MD  DISCHARGE PRIMARY DIAGNOSIS:    Stroke: Large left MCA territory infarct s/p TNK and IR with TICI 3 reperfusion, etiology: cardioembolic in the setting new diagnosis of A-fib   Secondary diagnosis Ischemic RLE with FA occlusion, s/p FA repair  Hx of stroke Afib RVR Cerebral aneurysm HTN HLD DM Dysphagia  Obesity OSA Leukocytosis CKD 3b    Patient Active Problem List   Diagnosis Date Noted   Leukocytosis 04/02/2024   Acute ischemic left MCA stroke (HCC) 03/29/2024   Critical limb ischemia of right lower extremity (HCC) 03/29/2024   Aphasia 03/29/2024   Right sided weakness 03/29/2024   Dehydration 09/11/2022   PUD (peptic ulcer disease) 09/10/2022   HLD (hyperlipidemia) 09/10/2022   Type II diabetes mellitus with renal manifestations (HCC) 09/10/2022   Gout 09/10/2022   Anxiety 09/10/2022   Obesity (BMI 30-39.9) 09/10/2022   Atrial fibrillation, chronic (HCC) 09/10/2022   Shingles 09/10/2022   UTI (urinary tract infection) 09/10/2022   Pancreatic cyst 09/10/2022   Elevated LFTs 09/10/2022   Myocardial injury 09/10/2022   Fall at home, initial encounter 09/10/2022   Intractable nausea and vomiting 09/10/2022   Hyperlipidemia, mixed 11/06/2021   Total knee replacement status 11/06/2021   Primary osteoarthritis of right knee 10/04/2021   Status post reverse arthroplasty of right shoulder 02/11/2021   Rotator cuff arthropathy 12/08/2020   Pressure injury of skin 12/08/2020   Atherosclerosis of abdominal aorta 01/16/2020   Calculus of hepatic duct    Diabetes mellitus with peripheral angiopathy (HCC) 01/12/2018   Osteoarthritis 07/09/2016   Recurrent  nephrolithiasis 07/09/2016   Essential hypertension 07/05/2016   History of CVA (cerebrovascular accident) 07/05/2016   Medicare annual wellness visit, initial 07/05/2016   Spinal stenosis of lumbar region with neurogenic claudication 04/19/2016   OSA on CPAP 12/29/2015   CKD (chronic kidney disease) stage 4, GFR 15-29 ml/min (HCC) 05/29/2015   H/O adenomatous polyp of colon 01/02/2015   Polyarticular gout 05/22/2014   Diabetes mellitus type 2, controlled, with complications (HCC) 02/25/2014   Foreign body in bladder and urethra 08/31/2013   Uric acid nephrolithiasis 08/31/2013   Kidney stone 08/09/2013   Ureteric stone 08/09/2013   Hydronephrosis 08/09/2013   Renal colic 08/09/2013     Allergies as of 04/03/2024       Reactions   Prednisone Other (See Comments)   unknown   Lactose Intolerance (gi)    Milk    Morphine  And Codeine Nausea And Vomiting   Vicodin [hydrocodone-acetaminophen ] Nausea And Vomiting   Tolerates acetaminophen         Medication List     STOP taking these medications    allopurinol  100 MG tablet Commonly known as: ZYLOPRIM    ALPRAZolam  0.5 MG tablet Commonly known as: XANAX    amLODipine  10 MG tablet Commonly known as: NORVASC    aspirin  EC 81 MG tablet   Chlorpheniramine-DM 4-30 MG Tabs   fluconazole 150 MG tablet Commonly known as: DIFLUCAN   furosemide  20 MG tablet Commonly known as: LASIX    glipiZIDE  10 MG tablet Commonly known as: GLUCOTROL    loperamide  2 MG capsule Commonly known as: IMODIUM    losartan 50 MG tablet  Commonly known as: COZAAR   Melatonin 5 MG Caps   metFORMIN  500 MG tablet Commonly known as: GLUCOPHAGE    metoprolol  tartrate 100 MG tablet Commonly known as: LOPRESSOR    mupirocin  ointment 2 % Commonly known as: BACTROBAN    nitrofurantoin 50 MG capsule Commonly known as: MACRODANTIN   nystatin  cream Commonly known as: MYCOSTATIN    oxyCODONE  5 MG immediate release tablet Commonly known as: Oxy  IR/ROXICODONE    trimethoprim  100 MG tablet Commonly known as: TRIMPEX    TYLENOL  500 MG tablet Generic drug: acetaminophen    vitamin B-12 500 MCG tablet Commonly known as: CYANOCOBALAMIN    Vitamin D3 50 MCG (2000 UT) Tabs       TAKE these medications    antiseptic oral rinse Liqd Apply 15 mLs topically as needed for dry mouth.   artificial tears ophthalmic solution Place 1 drop into both eyes 4 (four) times daily as needed for dry eyes.   glycopyrrolate  1 MG tablet Commonly known as: ROBINUL  Take 1 tablet (1 mg total) by mouth every 4 (four) hours as needed (excessive secretions).   glycopyrrolate  0.2 MG/ML injection Commonly known as: ROBINUL  Inject 1 mL (0.2 mg total) into the skin every 4 (four) hours as needed (excessive secretions).   glycopyrrolate  0.2 MG/ML injection Commonly known as: ROBINUL  Inject 1 mL (0.2 mg total) into the vein every 4 (four) hours as needed (excessive secretions).   LORazepam  1 MG tablet Commonly known as: ATIVAN  Take 1 tablet (1 mg total) by mouth every 4 (four) hours as needed for anxiety.   LORazepam  2 MG/ML concentrated solution Commonly known as: ATIVAN  Place 0.5 mLs (1 mg total) under the tongue every 4 (four) hours as needed for anxiety.   LORazepam  2 MG/ML injection Commonly known as: ATIVAN  Inject 0.5 mLs (1 mg total) into the vein every 4 (four) hours as needed for anxiety.   morphine  (PF) 2 MG/ML injection Inject 1 mL (2 mg total) into the vein every 6 (six) hours.   morphine  (PF) 2 MG/ML injection Inject 1-2 mLs (2-4 mg total) into the vein every hour as needed (To alleviate signs and symptoms of distress; Dyspnea and/or Pain).   ondansetron  4 MG disintegrating tablet Commonly known as: ZOFRAN -ODT Take 1 tablet (4 mg total) by mouth every 6 (six) hours as needed for nausea. What changed:  when to take this reasons to take this               Discharge Care Instructions  (From admission, onward)            Start     Ordered   04/03/24 0000  Change dressing (specify)       Comments: Dressing change: PRN hours to wick moisture away   04/03/24 1312            LABORATORY STUDIES CBC    Component Value Date/Time   WBC 12.2 (H) 04/02/2024 0749   RBC 2.95 (L) 04/02/2024 0749   HGB 9.2 (L) 04/02/2024 0749   HGB 12.9 08/13/2013 1255   HCT 28.3 (L) 04/02/2024 0749   HCT 39.6 08/13/2013 1255   PLT 192 04/02/2024 0749   PLT 390 08/13/2013 1255   MCV 95.9 04/02/2024 0749   MCV 89 08/13/2013 1255   MCH 31.2 04/02/2024 0749   MCHC 32.5 04/02/2024 0749   RDW 15.3 04/02/2024 0749   RDW 14.2 08/13/2013 1255   LYMPHSABS 2.2 03/29/2024 1311   LYMPHSABS 1.8 08/13/2013 1255   MONOABS 0.7 03/29/2024 1311  MONOABS 0.7 08/13/2013 1255   EOSABS 0.4 03/29/2024 1311   EOSABS 0.2 08/13/2013 1255   BASOSABS 0.1 03/29/2024 1311   BASOSABS 0.1 08/13/2013 1255   CMP    Component Value Date/Time   NA 142 04/01/2024 0412   NA 139 08/13/2013 1255   K 4.7 04/01/2024 0412   K 4.1 08/13/2013 1255   CL 110 04/01/2024 0412   CL 104 08/13/2013 1255   CO2 24 04/01/2024 0412   CO2 30 08/13/2013 1255   GLUCOSE 247 (H) 04/01/2024 0412   GLUCOSE 147 (H) 08/13/2013 1255   BUN 29 (H) 04/01/2024 0412   BUN 14 08/13/2013 1255   CREATININE 1.52 (H) 04/01/2024 0412   CREATININE 1.62 (H) 08/13/2013 1255   CALCIUM  8.2 (L) 04/01/2024 0412   CALCIUM  9.1 08/13/2013 1255   PROT 4.5 (L) 04/01/2024 0412   PROT 6.2 (L) 07/28/2013 1213   ALBUMIN  2.5 (L) 04/01/2024 0412   ALBUMIN  2.9 (L) 07/28/2013 1213   AST 43 (H) 04/01/2024 0412   AST 24 07/28/2013 1213   ALT 30 04/01/2024 0412   ALT 24 07/28/2013 1213   ALKPHOS 42 04/01/2024 0412   ALKPHOS 60 07/28/2013 1213   BILITOT 0.5 04/01/2024 0412   BILITOT 1.3 (H) 07/28/2013 1213   GFRNONAA 35 (L) 04/01/2024 0412   GFRNONAA 32 (L) 08/13/2013 1255   GFRAA 42 (L) 01/24/2019 1137   GFRAA 37 (L) 08/13/2013 1255   COAGS Lab Results  Component Value Date    INR 1.2 03/30/2024   INR 1.0 09/10/2022   Lipid Panel    Component Value Date/Time   CHOL 123 03/30/2024 0126   TRIG 61 03/30/2024 0126   HDL 28 (L) 03/30/2024 0126   CHOLHDL 4.4 03/30/2024 0126   VLDL 12 03/30/2024 0126   LDLCALC 83 03/30/2024 0126   HgbA1C  Lab Results  Component Value Date   HGBA1C 6.9 (H) 03/30/2024   Alcohol  Level    Component Value Date/Time   ETH <15 03/29/2024 1311    HISTORY OF PRESENT ILLNESS 78 y.o. patient with history of of A-fib not on anticoagulation, hypertension, diabetes, CKD and sleep apnea admitted for acute onset aphasia and right-sided weakness. Patient was given TNK and taken to interventional radiology for mechanical thrombectomy, which was successful. After thrombectomy, she was found to have an ischemic right lower extremity, and she was taken to the OR for right lower extremity thrombectomy. MRI brain reveals acute left MCA territory infarcts, and 24-hour CT reveals no hemorrhagic transformation but hyperdense left MCA. Given size of infarcts, MCA likely reoccluded. Will start patient on heparin  for atrial fibrillation. NIH on Admission 17   HOSPITAL COURSE Stroke: Large left MCA territory infarct s/p TNK and IR with TICI 3 reperfusion, etiology: cardioembolic in the setting new diagnosis of A-fib Code Stroke CT head No acute abnormality. Small vessel disease. Atrophy. ASPECTS 10.    CTA head & neck acute left MCA occlusion, right VA occlusion with distal severe stenosis, left ICA siphon moderate stenosis, right ICA bulb 50% stenosis Status post IR with  TICI3 24 hour CT evolving infarct in left MCA territory with no hemorrhagic transformation, asymmetric density of left MCA MRI large acute left MCA territory infarct 2D Echo EF 55-60%  LDL 83 HgbA1c 6.9 VTE prophylaxis - none for comfort care aspirin  81 mg daily prior to admission, heparin  DC'd for comfort care Social/ethics: Family met with palliative care to discuss goals of care  and EOL decisions. Family opted to pursue full  comfort measures, all medication were discontinued except comfort medications and proceed to transfer to Hospice facility Disposition:  on full comfort care and discharge to residential hospice    Ischemic RLE with FA occlusion, post IR Occurred post-thrombectomy Underline PVD Vascular surgery on board Emergent right CFA and SFA repair with right LE thrombectomy Wound VAC removed JP drain removed for comfort care   History of stroke Family reports patient had a stroke in her 82s ? History of atrial fibrillation during previous hospitalizations in the setting of acute illness  No records for review on EMAR of previous stroke though family reports stroke as mild with possible afib No residual deficits per family   Atrial fibrillation with RVR Home Meds: metoprolol  100 mg daily Continue telemetry monitoring Per family, patient has not had a formal diagnosis of atrial fibrillation though she has had issues since July/August with waking up with a really high heart rate and then bottoming out Previous EKGs during admissions without evidence of atrial fibrillation on IV heparin , now discontinued On amiodarone  IV has been discontinued Metoprolol  25->50 mg Q8h, discontinued   Cerebral aneurysm CTA head and neck showed 6x4 aneurysm of left cavernous ICA   Hypertension Home meds:  amlodipine  10 mg daily, losartan 50 mg daily Stable on metoprolol  25->50 bid discontinued   Hyperlipidemia Home meds: None LDL 83  on rosuvastatin  20 mg daily-discontinued   Diabetes type II Controlled Home meds: Metformin  500 mg 3 times daily HgbA1c 6.9    Dysphagia Patient has post-stroke dysphagia  NPO Coretrak in place 11/21 discontinued   Other Stroke Risk Factors Obesity, Body mass index is 36.01 kg/m.  Obstructive sleep apnea   Other Active Problems Leukocytosis, WBC 14.9--13.6--11.5-12.2, afebrile CKD 3B, creatinine 1.81--1.68--1.52     RN Pressure Injury Documentation: Wound 03/29/24 2105 Pressure Injury Buttocks Bilateral Deep Tissue Pressure Injury - Purple or maroon localized area of discolored intact skin or blood-filled blister due to damage of underlying soft tissue from pressure and/or shear. (Active)     DISCHARGE EXAM  PHYSICAL EXAM General: Ill-appearing elderly patient in no acute distress CV: A-fib on the monitor Respiratory:  Regular, unlabored respirations on supplemental O2   NEURO:  Limited due to comfort care measures. Pt eyes closed, not in distress. Still has R facial droop, no spontaneous movement of extremities.   Discharge Diet       Diet   Diet NPO time specified   liquids  DISCHARGE PLAN Disposition: Hospice facility   35 minutes were spent preparing discharge.  Ary Cummins, MD PhD Stroke Neurology 04/03/2024 2:14 PM

## 2024-04-03 NOTE — Progress Notes (Signed)
   Palliative Medicine Inpatient Follow Up Note HPI: 78 year old female with past medical history as below, which is significant for atrial fibrillation not on anticoagulation, hypertension, diabetes mellitus type 2, CKD, and OSA who presented to [facility] emergency department 11/20 with complaints of acute onset aphasia and right-sided weakness. (+) Left MCA mechanical thrombectomy performed and TNK given. Ischemic right lower extremity found after procedure, and thrombectomy performed.    Palliative care requested to support additional goals of care conversations.   Today's Discussion 04/03/2024  *Please note that this is a verbal dictation therefore any spelling or grammatical errors are due to the Dragon Medical One system interpretation.  I reviewed the chart notes including nursing notes from today, progress notes from today. I also reviewed vital signs, nursing flowsheets, medication administrations record, labs, and imaging.    I met with [name]'s son, [name] and daughter, [name] this morning.  We reviewed that [name] had a fairly good sleep until about 3AM when she put her hand out for her family.  At this time [name] is having cheyene-stokes respirations. I shared with patients family the physiological changes to expect at this time and moving forward into the dying process.  We reviewed that as of now, symptoms seem to be well managed.  We reviewed the plan for hospice evaluation this morning to determine if [name] meets criteria to transition to the hospice home in [place].   Questions and concerns addressed/Palliative Support Provided.   Objective Assessment: Vital Signs Vitals:   04/02/24 2101 04/03/24 0823  BP: 115/67 90/67  Pulse: 79 91  Resp: 14 16  Temp: 99.3 F (37.4 C) 98.8 F (37.1 C)  SpO2: 96% 94%    Intake/Output Summary (Last 24 hours) at 04/03/2024 9045 Last data filed at 04/02/2024 1748 Gross per 24 hour  Intake 428 ml  Output 1261 ml  Net  -833 ml   Last Weight  Most recent update: 04/02/2024  5:22 AM    Weight  92.2 kg (203 lb 4.2 oz)            Gen: Elderly Caucasian female chronically ill-appearing HEENT: Dobbhoff in place, dry mucous membranes CV: Regular rate and irregular rhythm PULM: On 2 L nasal cannula breathing is even and nonlabored ABD: soft/nontender  EXT: LE edema  Neuro: Somnolent will arouse but falls back asleep within a relatively short period of time  SUMMARY OF RECOMMENDATIONS   DNAR/DNI  Comfort care  Comfort medications have been reviewed, no changes will be made at this time  Additional medications in [facility]  Unrestricted visitation  Plan for [facility] to evaluate for inpatient hospice home in [facility]  Ongoing palliative care support ______________________________________________________________________________________ Rosaline Becton [facility] Palliative Medicine Team Team Cell Phone: [phone] Please utilize secure chat with additional questions, if there is no response within 30 minutes please call the above phone number  Billing based on MDM: High - review of parenteral controlled substances.  Palliative Medicine Team providers are available by phone from 7am to 7pm daily and can be reached through the team cell phone.  Should this patient require assistance outside of these hours, please call the patient's attending physician.

## 2024-04-03 NOTE — Care Management Important Message (Signed)
 Important Message  Patient Details  Name: [name] MRN: [id number] Date of Birth: 24-Mar-1946   Important Message Given:  Yes - Medicare IM     [name] 04/03/2024, 4:24 PM

## 2024-05-10 DEATH — deceased

## 2024-05-14 ENCOUNTER — Other Ambulatory Visit: Admitting: Urology
